# Patient Record
Sex: Male | Born: 1980
Health system: Southern US, Community
[De-identification: ages and names within clinical notes are randomized; demographics above are authoritative.]

## PROBLEM LIST (undated history)

## (undated) ENCOUNTER — Encounter

## (undated) DIAGNOSIS — K603 Anal fistula, unspecified: Secondary | ICD-10-CM

## (undated) DIAGNOSIS — F329 Major depressive disorder, single episode, unspecified: Secondary | ICD-10-CM

## (undated) DIAGNOSIS — F419 Anxiety disorder, unspecified: Secondary | ICD-10-CM

## (undated) DIAGNOSIS — G540 Brachial plexus disorders: Secondary | ICD-10-CM

## (undated) DIAGNOSIS — Z21 Asymptomatic human immunodeficiency virus [HIV] infection status: Secondary | ICD-10-CM

## (undated) DIAGNOSIS — F32A Depression, unspecified: Secondary | ICD-10-CM

## (undated) DIAGNOSIS — B20 Human immunodeficiency virus [HIV] disease: Secondary | ICD-10-CM

## (undated) DIAGNOSIS — F319 Bipolar disorder, unspecified: Secondary | ICD-10-CM

## (undated) DIAGNOSIS — F1910 Other psychoactive substance abuse, uncomplicated: Secondary | ICD-10-CM

## (undated) HISTORY — DX: Anxiety disorder, unspecified: F41.9

## (undated) HISTORY — PX: OTHER SURGICAL HISTORY: SHX169

---
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
## 2011-08-14 HISTORY — PX: OTHER SURGICAL HISTORY: SHX169

## 2015-02-16 DIAGNOSIS — F419 Anxiety disorder, unspecified: Secondary | ICD-10-CM | POA: Insufficient documentation

## 2017-05-15 ENCOUNTER — Emergency Department: Admit: 2017-05-15 | Discharge: 2017-05-15

## 2017-05-15 ENCOUNTER — Inpatient Hospital Stay: Admit: 2017-05-15 | Discharge: 2017-05-15

## 2017-05-15 DIAGNOSIS — F1510 Other stimulant abuse, uncomplicated: Secondary | ICD-10-CM

## 2017-05-15 DIAGNOSIS — F329 Major depressive disorder, single episode, unspecified: Secondary | ICD-10-CM

## 2017-05-15 DIAGNOSIS — F1910 Other psychoactive substance abuse, uncomplicated: Secondary | ICD-10-CM

## 2017-05-15 DIAGNOSIS — G540 Brachial plexus disorders: Secondary | ICD-10-CM

## 2017-05-15 DIAGNOSIS — F419 Anxiety disorder, unspecified: Secondary | ICD-10-CM

## 2017-05-15 DIAGNOSIS — B20 Human immunodeficiency virus [HIV] disease: Secondary | ICD-10-CM

## 2017-05-15 DIAGNOSIS — F1590 Other stimulant use, unspecified, uncomplicated: Secondary | ICD-10-CM

## 2017-05-15 DIAGNOSIS — R42 Dizziness and giddiness: Principal | ICD-10-CM

## 2017-05-15 DIAGNOSIS — Z21 Asymptomatic human immunodeficiency virus [HIV] infection status: Secondary | ICD-10-CM

## 2017-05-15 DIAGNOSIS — R000 Tachycardia, unspecified: Secondary | ICD-10-CM

## 2017-05-15 DIAGNOSIS — F1990 Other psychoactive substance use, unspecified, uncomplicated: Principal | ICD-10-CM

## 2017-05-15 DIAGNOSIS — F17200 Nicotine dependence, unspecified, uncomplicated: Secondary | ICD-10-CM

## 2017-05-15 DIAGNOSIS — M792 Neuralgia and neuritis, unspecified: Secondary | ICD-10-CM

## 2017-05-15 MED ORDER — DULOXETINE HCL 60 MG PO CPEP
Freq: Every day | ORAL
Start: 2017-05-15 — End: ?

## 2017-05-15 MED ORDER — BOLUS IV FLUID JX
Freq: Once | INTRAVENOUS | Status: CP
Start: 2017-05-15 — End: ?

## 2017-05-15 MED ORDER — DIPHENHYDRAMINE HCL 25 MG PO CAPS
25 mg | Freq: Once | ORAL | Status: CP
Start: 2017-05-15 — End: ?

## 2017-05-15 MED ORDER — GABAPENTIN 100 MG PO CAPS
Freq: Three times a day (TID) | ORAL
Start: 2017-05-15 — End: ?

## 2017-05-15 NOTE — ED Provider Notes
R rib resaction   ? SCALENOTOMY         No family history on file.    Social History     Social History   ? Marital status: Single     Spouse name: N/A   ? Number of children: N/A   ? Years of education: N/A     Social History Main Topics   ? Smoking status: Light Tobacco Smoker   ? Smokeless tobacco: Never Used   ? Alcohol use No   ? Drug use: Yes      Comment: IVDU crystal meth   ? Sexual activity: Not Asked     Other Topics Concern   ? None     Social History Narrative   ? None       Review of Systems   Constitutional: Negative for fever, chills and diaphoresis.   HENT: Negative for hearing loss, ear pain, nasal congestion and sore throat.    Eyes: Negative for pain, redness and visual disturbance.   Respiratory: Negative for cough, chest tightness and shortness of breath.    Cardiovascular: Positive for chest pain.   Gastrointestinal: Negative for nausea, vomiting, abdominal pain, diarrhea, constipation and blood in stool.   Genitourinary: Negative for dysuria, urgency, frequency, hematuria and difficulty urinating.   Musculoskeletal: Negative for myalgias and arthralgias.   Skin: Negative for rash.   Neurological: Positive for light-headedness. Negative for tremors, weakness, numbness and headaches.   Psychiatric/Behavioral: Positive for anxiety and substance abuse.   Endocrine: Negative for polyuria.   All other systems reviewed and are negative.      Physical Exam     ED Triage Vitals [05/15/17 1304]   BP 100/69   Pulse 125   Resp 20   Temp 36.3 ?C (97.4 ?F)   Temp src Oral   Height 1.626 m   Weight 70.3 kg   SpO2 99 %   BMI (Calculated) 26.66             Physical Exam   Constitutional: He appears well-developed and well-nourished. No distress.   HENT:   Head: Normocephalic and atraumatic.   Mouth/Throat: Oropharynx is clear and moist. No oropharyngeal exudate.   Eyes: Conjunctivae are normal. Right eye exhibits no discharge. Left eye exhibits no discharge.   Pupils 3-81mm, symmetric and reactive

## 2017-05-15 NOTE — ED Provider Notes
MDM   Decide to obtain history from someone other than the patient: {SH ED Jesse Spencer MDM - OBTAIN ZOXWRUE:45409}    Decide to obtain previous medical records: [facility] ED Jesse Spencer MDM - PREVIOUS MED REC - NO WJX:91478}    Clinical Lab Test(s): {SH ED Jesse Spencer MDM ORDERED AND REVIEWED:28124}    Diagnostic Tests (Radiology, EKG): {SH ED Jesse Spencer MDM ORDERED AND REVIEWED:28124}    Independent Visualization (ED [place], Wet Prep, Other): {SH ED Jesse Spencer MDM NO YES [id number]:86578}    Discussed patient with NON-ED Provider: {SH ED Jesse Spencer MDM - ANOTHER PROVIDER:28381}      ED Disposition   ED Disposition: No ED Disposition Set      ED Clinical Impression   ED Clinical Impression:   No Clinical Impression Set      ED Patient Status   Patient Status:   {SH ED [name] PATIENT [phone]}        ED Medical Evaluation Initiated   Medical Evaluation Initiated:  Yes, filed at 05/15/17 1344  by Sonda Rumble, MD

## 2017-05-15 NOTE — ED Provider Notes
Clinical Lab Test(s): {SH ED Lamonte Sakai MDM ORDERED AND REVIEWED:28124}    Diagnostic Tests (Radiology, EKG): {SH ED Lamonte Sakai MDM ORDERED AND REVIEWED:28124}    Independent Visualization (ED [place], Wet Prep, Other): {SH ED Lamonte Sakai MDM NO YES ZOXWRUEA:54098}    Discussed patient with NON-ED Provider: {SH ED Lamonte Sakai MDM - ANOTHER PROVIDER:28381}      ED Disposition   ED Disposition: No ED Disposition Set      ED Clinical Impression   ED Clinical Impression:   No Clinical Impression Set      ED Patient Status   Patient Status:   {SH ED [facility] PATIENT [phone]}        ED Medical Evaluation Initiated   Medical Evaluation Initiated:  Yes, filed at 05/15/17 1344  by Sonda Rumble, MD

## 2017-05-15 NOTE — ED Provider Notes
exhibits no discharge.   Pupils 3-44mm, symmetric and reactive   Neck: Normal range of motion. Neck supple.   Cardiovascular: Regular rhythm, normal heart sounds and intact distal pulses.  Tachycardia present.  Exam reveals no gallop and no friction rub.    No murmur heard.  Pulmonary/Chest: Effort normal and breath sounds normal. No respiratory distress. He has no wheezes. He has no rales.   Abdominal: Soft. Bowel sounds are normal. He exhibits no distension. There is no tenderness. There is no rebound and no guarding.   Musculoskeletal: He exhibits no edema.   Neurological: He is alert.   Skin: Skin is warm and dry. He is not diaphoretic.   Psychiatric: He has a normal mood and affect. His behavior is normal.   Nursing note and vitals reviewed.      Differential DDx: ***    Is this an Emergent Medical Condition? {SH ED EMERGENT MEDICAL CONDITION:782 082 1039}  409.901 FS  641.19 FS  627.732 (16) FS    ED Workup   Procedures    Labs:  - - No data to display      Imaging (Read by ED Provider):  {Imaging [phone]}      EKG (Read by ED Provider):  {EKG findings:[phone]}        ED Course & Re-Evaluation     ED Course as of May 15 1356   Wed May 15, 2017   1352 Patient presenting after doing crystal meth IV last night around midnight. States he feels like he's tweeking out. Also with "warm sensation" in his chest. Feels some palpitations but improving. He denies any SOB, fevers or chills.     Vitals here with tachcyardia. Pupils 3-73mm and reactive, heart sounds normal other than tachycardia. Lungs CTAB. Abdomen soft and nontender.    Will give IV fluids and benadryl. Will get ECG, Trop, and CXR  [CC]      ED Course User Index  [CC] Clugston, Kandee Keen, MD         MDM   Decide to obtain history from someone other than the patient: {SH ED Lamonte Sakai MDM - OBTAIN ZOXWRUE:45409}    Decide to obtain previous medical records: [facility] ED Lamonte Sakai MDM - PREVIOUS MED REC - NO WJX:91478}

## 2017-05-15 NOTE — ED Provider Notes
Procedure Laterality Date   ? OTHER SURGICAL HISTORY      R rib resaction   ? SCALENOTOMY         No family history on file.    Social History     Social History   ? Marital status: N/A     Spouse name: N/A   ? Number of children: N/A   ? Years of education: N/A     Social History Main Topics   ? Smoking status: Light Tobacco Smoker   ? Smokeless tobacco: Never Used   ? Alcohol use No   ? Drug use: Yes      Comment: IVDU crystal meth   ? Sexual activity: Not Asked     Other Topics Concern   ? None     Social History Narrative   ? None       Review of Systems   Constitutional: Negative for fever, chills and diaphoresis.   HENT: Negative for hearing loss, ear pain, nasal congestion and sore throat.    Eyes: Negative for pain, redness and visual disturbance.   Respiratory: Negative for cough, chest tightness and shortness of breath.    Cardiovascular: Positive for chest pain.   Gastrointestinal: Negative for nausea, vomiting, abdominal pain, diarrhea, constipation and blood in stool.   Genitourinary: Negative for dysuria, urgency, frequency, hematuria and difficulty urinating.   Musculoskeletal: Negative for myalgias and arthralgias.   Skin: Negative for rash.   Neurological: Positive for light-headedness. Negative for tremors, weakness, numbness and headaches.   Psychiatric/Behavioral: Positive for anxiety and substance abuse.   Endocrine: Negative for polyuria.   All other systems reviewed and are negative.      Physical Exam     ED Triage Vitals [05/15/17 1304]   BP 100/69   Pulse 125   Resp 20   Temp 36.3 ?C (97.4 ?F)   Temp src Oral   Height 1.626 m   Weight 70.3 kg   SpO2 99 %   BMI (Calculated) 26.66             Physical Exam   Constitutional: He appears well-developed and well-nourished. No distress.   HENT:   Head: Normocephalic and atraumatic.   Mouth/Throat: Oropharynx is clear and moist. No oropharyngeal exudate.   Eyes: Conjunctivae are normal. Right eye exhibits no discharge. Left eye

## 2017-05-15 NOTE — ED Provider Notes
Neck: Normal range of motion. Neck supple. No JVD present. No tracheal deviation present.   Cardiovascular: Regular rhythm, normal heart sounds and intact distal pulses.  Tachycardia present.  Exam reveals no gallop and no friction rub.    No murmur heard.  Pulmonary/Chest: Effort normal and breath sounds normal. No respiratory distress. He has no wheezes. He has no rales.   Abdominal: Soft. Bowel sounds are normal. He exhibits no distension. There is no tenderness. There is no rebound and no guarding.   Musculoskeletal: He exhibits no edema.   Neurological: He is alert.   Skin: Skin is warm and dry. He is not diaphoretic.   Psychiatric: He has a normal mood and affect. His behavior is normal.   Nursing note and vitals reviewed.      Differential DDx: ACS, Anxiety, Dissection, Intraparenchymal Bleed, and others.     Is this an Emergent Medical Condition? Yes - Severe Pain/Acute Onset of Symptons  409.901 FS  641.19 FS  627.732 (16) FS    ED Workup   Procedures    Labs:  -   POCT TROPININ I - Normal       Result Value Ref Range    Troponin I (Point of Care) <0.05  0.00 - 0.23 ng/mL   POCT TROPININ I         Imaging (Read by ED Provider):  Per Radiology: Xr Chest Single View    Result Date: 05/15/2017  PORTABLE CHEST X-RAY (SINGLE FRONTAL VIEW) Comparison: None History: Chest pain Findings: The lungs are clear.  The cardiac silhouette, mediastinum, and pulmonary vessels are within normal limits.  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 Read By Alla German M.D.  Electronically Verified By - Alla German M.D.  Released Date Time - 05/15/2017 2:36 PM  Resident -         EKG (Read by ED Provider):  Sinus tach, otherwise normal        ED Course & Re-Evaluation     ED Course as of May 16 1711   Wed May 15, 2017   1352 Patient presenting after doing crystal meth IV last night around midnight. States he feels like he's tweeking out. Also with "warm

## 2017-05-15 NOTE — ED Provider Notes
sensation" in his chest. Feels some palpitations but improving. He denies any SOB, fevers or chills.     Vitals here with tachcyardia. Pupils 3-48mm and reactive, heart sounds normal other than tachycardia. Lungs CTAB. Abdomen soft and nontender.    Will give IV fluids and benadryl. Will get ECG, Trop, and CXR  [CC]   1414 ECG with sinus tach  [CC]   1447 Tachycardia improved and patient feeling better. ECG with sinus tach without TWI. Troponin negative. Will discharge home. [facility].   [CC]   1450 Discussed [facility] follow-up with patient. Importance of HIV treatment, and avoiding IVDU. Patient verbalized undersatnding, amenable to discharge at this time.   [EO]      ED Course User Index  [CC] Clugston, Kandee Keen, MD  [EO] French Ana Legrand Pitts, MD         MDM   Decide to obtain history from someone other than the patient: No    Decide to obtain previous medical records: No    Clinical Lab Test(s): Ordered and Reviewed    Diagnostic Tests (Radiology, EKG): Ordered and Reviewed    Independent Visualization (ED [place], Wet Prep, Other): No    Discussed patient with NON-ED Provider: None      ED Disposition   ED Disposition: Discharge      ED Clinical Impression   ED Clinical Impression:   Light headed  Tachycardia  History of HIV infection (CMS-HCC code)  IV drug abuse (CMS-HCC code)      ED Patient Status   Patient Status:   Good        ED Medical Evaluation Initiated   Medical Evaluation Initiated:  Yes, filed at 05/15/17 1344  by Sonda Rumble, MD            Sonda Rumble, MD  Resident  05/15/17 [phone]

## 2017-05-15 NOTE — ED Provider Notes
sensation" in his chest. Feels some palpitations but improving. He denies any SOB, fevers or chills.     Vitals here with tachcyardia. Pupils 3-69mm and reactive, heart sounds normal other than tachycardia. Lungs CTAB. Abdomen soft and nontender.    Will give IV fluids and benadryl. Will get ECG, Trop, and CXR  [CC]   1414 ECG with sinus tach  [CC]      ED Course User Index  [CC] Clugston, Kandee Keen, MD         MDM   Decide to obtain history from someone other than the patient: No    Decide to obtain previous medical records: No    Clinical Lab Test(s): Ordered and Reviewed    Diagnostic Tests (Radiology, EKG): Ordered and Reviewed    Independent Visualization (ED [place], Wet Prep, Other): No    Discussed patient with NON-ED Provider: None      ED Disposition   ED Disposition: No ED Disposition Set      ED Clinical Impression   ED Clinical Impression:   Light headed      ED Patient Status   Patient Status:   Good        ED Medical Evaluation Initiated   Medical Evaluation Initiated:  Yes, filed at 05/15/17 1344  by Sonda Rumble, MD

## 2017-05-15 NOTE — ED Notes
IV removed with catheter intact.  Pressure applied, Time of discharge: 1503  PM., Patient discharged to  Home.  Patient discharged  ambulatory. to exit with belongings in  Stable condition.  Patient escorted by  no one., Written discharge instructions given to  patient.  Patient/recipient  verbalizes discharge instructions.

## 2017-05-15 NOTE — ED Triage Notes
Pt to ed co using iv crystal meth hrs pta. Denies fever or other recent illness. Denies falls or truama. Denies recent long trips or travel out of country. Denies exp to sick contacts. Denies recnt surg or proced. Denies new meds. Denies n/v. Reproted r sided cp. Denies sob cough. Denies pain with insp. Denies abd pain. Reported reprotd decrease in urine production. Denies other urinary ss rpeorted normal bms.     Pt stating dx with HIV 3-4 months ago. Has not started any tx. Denies si hi halluc

## 2017-05-15 NOTE — ED Provider Notes
History     Chief Complaint   Patient presents with   ? Drug Use       Patient is a 36 y.o. male and  has a past medical history of Anxiety; Depression; Drug use; HIV (human immunodeficiency virus infection) (CMS-HCC code); Nerve pain; and Thoracic outlet syndrome who presented to the ED with complaints of feeling uneasy after using crystal meth IVlast night around midnight. States he thinks he may have taken too much. Denies hallucinations but states he has before when he first started using 3-4 years ago and feels like he's just before that stage here. He states chest warmness but no pain. No shortness of breath. States he's been up since last night. Normally takes benadryl with some relief. No cough, fevers, or chills.       The history is provided by the patient.   Drug Use   Ingested substance:  Illicit drugs  Suspected agents: Crystal meth.  Time since incident:  12 hours  Ingestion amount:  Unknown amount IV  Witnesses present: no    Called poison control: no    Incident location:  Home  Context: intentional ingestion    Associated symptoms: chest pain    Associated symptoms: no abdominal pain, no cough, no diaphoresis, no diarrhea, no headaches, no nausea, no shortness of breath and no vomiting        Allergies   Allergen Reactions   ? Haldol [Haloperidol] Other (See Comments)     Tardive dyskinesia        Discharge Medication List as of 05/15/2017  2:49 PM      CONTINUE these medications which have NOT CHANGED    Details   DULoxetine (CYMBALTA) 60 MG PO Capsule Delayed Release Particles Take by mouth daily.Historical Med      gabapentin (NEURONTIN) 100 MG PO Capsule Take by mouth 3 times daily.Historical Med             Past Medical History:   Diagnosis Date   ? Anxiety    ? Depression    ? Drug use    ? HIV (human immunodeficiency virus infection) (CMS-HCC code)    ? Nerve pain    ? Thoracic outlet syndrome        Past Surgical History:   Procedure Laterality Date   ? OTHER SURGICAL HISTORY

## 2017-05-15 NOTE — ED Provider Notes
History     Chief Complaint   Patient presents with   ? Drug Use       Patient is a 36 y.o. male and  has a past medical history of Anxiety; Depression; Drug use; HIV (human immunodeficiency virus infection) (CMS-HCC code); Nerve pain; and Thoracic outlet syndrome who presented to the ED with complaints of feeling uneasy after using crystal meth IVlast night around midnight. States he thinks he may have taken too much. Denies hallucinations but states he has before when he first started using 3-4 years ago and feels like he's just before that stage here. He states chest warmness but no pain. No shortness of breath. States he's been up since last night. Normally takes benadryl with some relief. No cough, fevers, or chills.       The history is provided by the patient.   Drug Use   Ingested substance:  Illicit drugs  Suspected agents: Crystal meth.  Time since incident:  12 hours  Ingestion amount:  Unknown amount IV  Witnesses present: no    Called poison control: no    Incident location:  Home  Context: intentional ingestion    Associated symptoms: chest pain    Associated symptoms: no abdominal pain, no cough, no diaphoresis, no diarrhea, no headaches, no nausea, no shortness of breath and no vomiting        Allergies   Allergen Reactions   ? Haldol [Haloperidol] Other (See Comments)     Tardive dyskinesia        Patient's Medications   New Prescriptions    No medications on file   Previous Medications    DULOXETINE (CYMBALTA) 60 MG PO CAPSULE DELAYED RELEASE PARTICLES    Take by mouth daily.    GABAPENTIN (NEURONTIN) 100 MG PO CAPSULE    Take by mouth 3 times daily.   Modified Medications    No medications on file   Discontinued Medications    No medications on file       Past Medical History:   Diagnosis Date   ? Anxiety    ? Depression    ? Drug use    ? HIV (human immunodeficiency virus infection) (CMS-HCC code)    ? Nerve pain    ? Thoracic outlet syndrome        Past Surgical History:

## 2017-05-15 NOTE — ED Provider Notes
exhibits no discharge.   Pupils 3-64mm, symmetric and reactive   Neck: Normal range of motion. Neck supple.   Cardiovascular: Regular rhythm, normal heart sounds and intact distal pulses.  Tachycardia present.  Exam reveals no gallop and no friction rub.    No murmur heard.  Pulmonary/Chest: Effort normal and breath sounds normal. No respiratory distress. He has no wheezes. He has no rales.   Abdominal: Soft. Bowel sounds are normal. He exhibits no distension. There is no tenderness. There is no rebound and no guarding.   Musculoskeletal: He exhibits no edema.   Neurological: He is alert.   Skin: Skin is warm and dry. He is not diaphoretic.   Psychiatric: He has a normal mood and affect. His behavior is normal.   Nursing note and vitals reviewed.      Differential DDx: ACS, Anxiety, Dissesction, and others.     Is this an Emergent Medical Condition? Yes - Severe Pain/Acute Onset of Symptons  409.901 FS  641.19 FS  627.732 (16) FS    ED Workup   Procedures    Labs:  -   POCT TROPININ I - Normal       Result Value Ref Range    Troponin I (Point of Care) <0.05  0.00 - 0.23 ng/mL   POCT TROPININ I         Imaging (Read by ED Provider):  Per Radiology: Xr Chest Single View    Result Date: 05/15/2017  PORTABLE CHEST X-RAY (SINGLE FRONTAL VIEW) Comparison: None History: Chest pain Findings: The lungs are clear.  The cardiac silhouette, mediastinum, and pulmonary vessels are within normal limits.  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 Read By Alla German M.D.  Electronically Verified By - Alla German M.D.  Released Date Time - 05/15/2017 2:36 PM  Resident -         EKG (Read by ED Provider):  Sinus tach, otherwise normal        ED Course & Re-Evaluation     ED Course as of May 16 1443   Wed May 15, 2017   1352 Patient presenting after doing crystal meth IV last night around midnight. States he feels like he's tweeking out. Also with "warm

## 2017-05-15 NOTE — ED Provider Notes
History     Chief Complaint   Patient presents with   ? Drug Use       HPI    Allergies   Allergen Reactions   ? Haldol [Haloperidol] Other (See Comments)     Tardive dyskinesia        Patient's Medications   New Prescriptions    No medications on file   Previous Medications    DULOXETINE (CYMBALTA) 60 MG PO CAPSULE DELAYED RELEASE PARTICLES    Take by mouth daily.    GABAPENTIN (NEURONTIN) 100 MG PO CAPSULE    Take by mouth 3 times daily.   Modified Medications    No medications on file   Discontinued Medications    No medications on file       Past Medical History:   Diagnosis Date   ? Anxiety    ? Depression    ? Drug use    ? HIV (human immunodeficiency virus infection) (CMS-HCC code)    ? Nerve pain    ? Thoracic outlet syndrome        Past Surgical History:   Procedure Laterality Date   ? OTHER SURGICAL HISTORY      R rib resaction   ? SCALENOTOMY         No family history on file.    Social History     Social History   ? Marital status: N/A     Spouse name: N/A   ? Number of children: N/A   ? Years of education: N/A     Social History Main Topics   ? Smoking status: Light Tobacco Smoker   ? Smokeless tobacco: Never Used   ? Alcohol use No   ? Drug use: Yes      Comment: IVDU crystal meth   ? Sexual activity: Not Asked     Other Topics Concern   ? None     Social History Narrative   ? None       Review of Systems    Physical Exam     ED Triage Vitals [05/15/17 1304]   BP 100/69   Pulse 125   Resp 20   Temp 36.3 ?C (97.4 ?F)   Temp src Oral   Height 1.626 m   Weight 70.3 kg   SpO2 99 %   BMI (Calculated) 26.66             Physical Exam    Differential DDx: ***    Is this an Emergent Medical Condition? {SH ED EMERGENT MEDICAL CONDITION:[phone]}  409.901 FS  641.19 FS  627.732 (16) FS    ED Workup   Procedures    Labs:  - - No data to display      Imaging (Read by ED Provider):  {Imaging [phone]}      EKG (Read by ED Provider):  {EKG findings:[phone]}        ED Course & Re-Evaluation

## 2017-06-04 NOTE — ED Attestation Note
Attestation   I discussed this patient with the resident/fellow.   Comments: DOS 05/15/17  I do not specifically recall examining this patient.               Satira SarkWilliams, Deborah J, MD  06/04/17 [phone]

## 2017-09-11 ENCOUNTER — Encounter: Attending: Internal Medicine

## 2017-11-04 DIAGNOSIS — B20 Human immunodeficiency virus [HIV] disease: Principal | ICD-10-CM

## 2017-12-02 ENCOUNTER — Ambulatory Visit: Attending: Internal Medicine

## 2017-12-02 ENCOUNTER — Encounter

## 2017-12-02 DIAGNOSIS — Z21 Asymptomatic human immunodeficiency virus [HIV] infection status: Principal | ICD-10-CM

## 2017-12-02 DIAGNOSIS — B20 Human immunodeficiency virus [HIV] disease: Principal | ICD-10-CM

## 2017-12-18 ENCOUNTER — Encounter: Attending: Internal Medicine

## 2017-12-30 DIAGNOSIS — F331 Major depressive disorder, recurrent, moderate: Secondary | ICD-10-CM | POA: Insufficient documentation

## 2017-12-30 DIAGNOSIS — F1520 Other stimulant dependence, uncomplicated: Secondary | ICD-10-CM | POA: Insufficient documentation

## 2017-12-30 DIAGNOSIS — F1521 Other stimulant dependence, in remission: Secondary | ICD-10-CM | POA: Insufficient documentation

## 2017-12-30 DIAGNOSIS — Z8619 Personal history of other infectious and parasitic diseases: Secondary | ICD-10-CM

## 2017-12-30 DIAGNOSIS — A539 Syphilis, unspecified: Secondary | ICD-10-CM | POA: Insufficient documentation

## 2017-12-30 HISTORY — DX: Personal history of other infectious and parasitic diseases: Z86.19

## 2018-07-31 DIAGNOSIS — G540 Brachial plexus disorders: Secondary | ICD-10-CM | POA: Insufficient documentation

## 2018-07-31 HISTORY — DX: Brachial plexus disorders: G54.0

## 2018-10-01 DIAGNOSIS — B20 Human immunodeficiency virus [HIV] disease: Secondary | ICD-10-CM | POA: Insufficient documentation

## 2018-10-01 DIAGNOSIS — G540 Brachial plexus disorders: Secondary | ICD-10-CM | POA: Insufficient documentation

## 2018-10-01 DIAGNOSIS — Z5900 Homelessness unspecified: Secondary | ICD-10-CM | POA: Insufficient documentation

## 2018-10-01 DIAGNOSIS — Z21 Asymptomatic human immunodeficiency virus [HIV] infection status: Secondary | ICD-10-CM | POA: Insufficient documentation

## 2019-03-04 DIAGNOSIS — F3181 Bipolar II disorder: Secondary | ICD-10-CM | POA: Insufficient documentation

## 2019-06-08 ENCOUNTER — Ambulatory Visit: Payer: Self-pay

## 2019-06-08 ENCOUNTER — Other Ambulatory Visit: Payer: Self-pay

## 2019-06-08 DIAGNOSIS — Z113 Encounter for screening for infections with a predominantly sexual mode of transmission: Secondary | ICD-10-CM

## 2019-06-08 DIAGNOSIS — B20 Human immunodeficiency virus [HIV] disease: Secondary | ICD-10-CM

## 2019-06-08 DIAGNOSIS — Z79899 Other long term (current) drug therapy: Secondary | ICD-10-CM

## 2019-06-23 ENCOUNTER — Ambulatory Visit: Payer: Self-pay | Admitting: Pharmacist

## 2019-06-23 ENCOUNTER — Encounter: Payer: Self-pay | Admitting: Infectious Diseases

## 2019-07-06 ENCOUNTER — Other Ambulatory Visit: Payer: Self-pay

## 2019-07-06 ENCOUNTER — Ambulatory Visit: Payer: Self-pay

## 2019-07-21 ENCOUNTER — Ambulatory Visit: Payer: Self-pay | Admitting: Pharmacist

## 2019-07-21 ENCOUNTER — Telehealth: Payer: Self-pay

## 2019-07-21 ENCOUNTER — Encounter: Payer: Self-pay | Admitting: Infectious Diseases

## 2019-07-21 NOTE — Telephone Encounter (Signed)
Attempted to contact new transferring patient to reschedule missed appointment today with Doren Custard, NP. Left voicemail to contact [facility] to reschedule.  Records remain up front if patient calls to reschedule.  Eugenia Mcalpine

## 2019-07-23 ENCOUNTER — Other Ambulatory Visit: Payer: Self-pay | Admitting: Infectious Diseases

## 2019-07-23 ENCOUNTER — Other Ambulatory Visit: Payer: Self-pay

## 2019-07-23 ENCOUNTER — Ambulatory Visit: Payer: Self-pay

## 2019-07-23 ENCOUNTER — Telehealth: Payer: Self-pay | Admitting: Pharmacy Technician

## 2019-07-23 DIAGNOSIS — B20 Human immunodeficiency virus [HIV] disease: Secondary | ICD-10-CM

## 2019-07-23 DIAGNOSIS — Z79899 Other long term (current) drug therapy: Secondary | ICD-10-CM

## 2019-07-23 DIAGNOSIS — Z113 Encounter for screening for infections with a predominantly sexual mode of transmission: Secondary | ICD-10-CM

## 2019-07-23 MED ORDER — BICTEGRAVIR-EMTRICITAB-TENOFOV 50-200-25 MG PO TABS: 1.0000 | ORAL_TABLET | Freq: Every day | ORAL | 5 refills | Status: DC

## 2019-07-23 NOTE — Progress Notes (Signed)
Patient missed appt with me earlier this week d/t acute illness. He is working with Lovena Le / [facility] and requested refill for [place] prior to visit - will send to requested pharmacy.

## 2019-07-23 NOTE — Telephone Encounter (Signed)
[facility] Patient Advocate Encounter  Completed and sent [employer] Advancing Access application for [name] for this patient who is uninsured.    Patient is approved 07/23/2019 through 08/13/2019.  BIN      [id number] PCN    [id number] GRP    [id number] ID        [id number]  An application has been submitted for additional assistance beyond 08/13/2019   Kenneth Hunt [name] [facility] for Infectious Disease Phone: [phone] Fax:  [phone]

## 2019-07-24 LAB — T-HELPER CELL (CD4) - (RCID CLINIC ONLY)
CD4 % Helper T Cell: 26 % — ABNORMAL LOW (ref 33–65)
CD4 T Cell Abs: 86 /uL — ABNORMAL LOW (ref 400–1790)

## 2019-07-24 LAB — URINALYSIS
Bilirubin Urine: NEGATIVE
Glucose, UA: NEGATIVE
Hgb urine dipstick: NEGATIVE
Nitrite: NEGATIVE
Specific Gravity, Urine: 1.031 (ref 1.001–1.03)
pH: 5.5 (ref 5.0–8.0)

## 2019-07-24 LAB — URINE CYTOLOGY ANCILLARY ONLY
Chlamydia: NEGATIVE
Comment: NEGATIVE
Comment: NORMAL
Neisseria Gonorrhea: NEGATIVE

## 2019-07-27 NOTE — Telephone Encounter (Signed)
[facility] Patient Advocate Encounter  Received fax from [place], patient has been approved for the enrollment period 07/23/2019 to 07/22/2020. Billing information listed below.

## 2019-07-29 LAB — RPR: RPR Ser Ql: REACTIVE — AB

## 2019-07-29 LAB — CBC WITH DIFFERENTIAL/PLATELET
Absolute Monocytes: 622 cells/uL (ref 200–950)
Basophils Absolute: 44 cells/uL (ref 0–200)
Basophils Relative: 0.2 %
Eosinophils Absolute: 22 cells/uL (ref 15–500)
Eosinophils Relative: 0.1 %
HCT: 48.6 % (ref 38.5–50.0)
Hemoglobin: 17 g/dL (ref 13.2–17.1)
Lymphs Abs: 355 cells/uL — ABNORMAL LOW (ref 850–3900)
MCH: 31.4 pg (ref 27.0–33.0)
MCHC: 35 g/dL (ref 32.0–36.0)
MCV: 89.8 fL (ref 80.0–100.0)
MPV: 10.1 fL (ref 7.5–12.5)
Monocytes Relative: 2.8 %
Neutro Abs: 21157 cells/uL — ABNORMAL HIGH (ref 1500–7800)
Neutrophils Relative %: 95.3 %
Platelets: 292 10*3/uL (ref 140–400)
RBC: 5.41 10*6/uL (ref 4.20–5.80)
RDW: 12.5 % (ref 11.0–15.0)
Total Lymphocyte: 1.6 %
WBC: 22.2 10*3/uL — ABNORMAL HIGH (ref 3.8–10.8)

## 2019-07-29 LAB — QUANTIFERON-TB GOLD PLUS
Mitogen-NIL: 2.25 [IU]/mL
NIL: 0.7 [IU]/mL
QuantiFERON-TB Gold Plus: NEGATIVE
TB1-NIL: <0 [IU]/mL
TB2-NIL: <0 [IU]/mL

## 2019-07-29 LAB — LIPID PANEL
Cholesterol: 155 mg/dL
HDL: 55 mg/dL
LDL Cholesterol (Calc): 85 mg/dL
Non-HDL Cholesterol (Calc): 100 mg/dL
Total CHOL/HDL Ratio: 2.8 (calc)
Triglycerides: 60 mg/dL

## 2019-07-29 LAB — COMPLETE METABOLIC PANEL WITH GFR
AG Ratio: 1.6 (calc) (ref 1.0–2.5)
ALT: 15 U/L (ref 9–46)
AST: 19 U/L (ref 10–40)
Albumin: 4.3 g/dL (ref 3.6–5.1)
Alkaline phosphatase (APISO): 81 U/L (ref 36–130)
BUN: 11 mg/dL (ref 7–25)
CO2: 23 mmol/L (ref 20–32)
Calcium: 9.6 mg/dL (ref 8.6–10.3)
Chloride: 103 mmol/L (ref 98–110)
Creat: 1.02 mg/dL (ref 0.60–1.35)
GFR, Est African American: 106 mL/min/{1.73_m2} (ref >=60)
GFR, Est Non African American: 92 mL/min/{1.73_m2} (ref >=60)
Globulin: 2.7 g/dL (calc) (ref 1.9–3.7)
Glucose, Bld: 123 mg/dL — ABNORMAL HIGH (ref 65–99)
Potassium: 4.6 mmol/L (ref 3.5–5.3)
Sodium: 136 mmol/L (ref 135–146)
Total Bilirubin: 1 mg/dL (ref 0.2–1.2)
Total Protein: 7 g/dL (ref 6.1–8.1)

## 2019-07-29 LAB — HEPATITIS B SURFACE ANTIGEN: Hepatitis B Surface Ag: NONREACTIVE

## 2019-07-29 LAB — HLA B*5701: HLA-B*5701 w/rflx HLA-B High: NEGATIVE

## 2019-07-29 LAB — HEPATITIS C ANTIBODY
Hepatitis C Ab: NONREACTIVE
SIGNAL TO CUT-OFF: 0.1 (ref <1.00)

## 2019-07-29 LAB — HEPATITIS A ANTIBODY, TOTAL: Hepatitis A AB,Total: REACTIVE — AB

## 2019-07-29 LAB — HEPATITIS B SURFACE ANTIBODY,QUALITATIVE: Hep B S Ab: NONREACTIVE

## 2019-07-29 LAB — RPR TITER: RPR Titer: 1:1 {titer} — ABNORMAL HIGH

## 2019-07-29 LAB — HIV-1/2 AB - DIFFERENTIATION
HIV-1 antibody: POSITIVE — AB
HIV-2 Ab: NEGATIVE

## 2019-07-29 LAB — FLUORESCENT TREPONEMAL AB(FTA)-IGG-BLD: Fluorescent Treponemal ABS: REACTIVE — AB

## 2019-07-29 LAB — HIV-1 RNA ULTRAQUANT REFLEX TO GENTYP+
HIV 1 RNA Quant: 42 {copies}/mL — ABNORMAL HIGH
HIV-1 RNA Quant, Log: 1.62 {Log_copies}/mL — ABNORMAL HIGH

## 2019-07-29 LAB — HIV ANTIBODY (ROUTINE TESTING W REFLEX): HIV 1&2 Ab, 4th Generation: REACTIVE — AB

## 2019-07-29 LAB — HEPATITIS B CORE ANTIBODY, TOTAL: Hep B Core Total Ab: NONREACTIVE

## 2019-08-24 ENCOUNTER — Encounter: Payer: Self-pay | Admitting: Infectious Diseases

## 2019-08-24 ENCOUNTER — Other Ambulatory Visit: Payer: Self-pay

## 2019-08-24 ENCOUNTER — Ambulatory Visit (INDEPENDENT_AMBULATORY_CARE_PROVIDER_SITE_OTHER): Payer: Self-pay | Admitting: Infectious Diseases

## 2019-08-24 VITALS — BP 120/82 | HR 85 | Wt 164.8 lb

## 2019-08-24 DIAGNOSIS — F1520 Other stimulant dependence, uncomplicated: Secondary | ICD-10-CM

## 2019-08-24 DIAGNOSIS — Z23 Encounter for immunization: Secondary | ICD-10-CM

## 2019-08-24 DIAGNOSIS — F1990 Other psychoactive substance use, unspecified, uncomplicated: Secondary | ICD-10-CM

## 2019-08-24 DIAGNOSIS — F3181 Bipolar II disorder: Secondary | ICD-10-CM

## 2019-08-24 DIAGNOSIS — Z8619 Personal history of other infectious and parasitic diseases: Secondary | ICD-10-CM

## 2019-08-24 DIAGNOSIS — B20 Human immunodeficiency virus [HIV] disease: Secondary | ICD-10-CM

## 2019-08-24 DIAGNOSIS — F331 Major depressive disorder, recurrent, moderate: Secondary | ICD-10-CM

## 2019-08-24 DIAGNOSIS — Z113 Encounter for screening for infections with a predominantly sexual mode of transmission: Secondary | ICD-10-CM

## 2019-08-24 NOTE — Patient Instructions (Addendum)
Very nice to meet you today and I look forward to working with you.   Please continue to take your Biktarvy every day as you are.  If you take any multivitamins please separate from your biktarvy 6 hours after  Vaccines Given Today:  Hepatitis B - new 2 dose regimen with Heplisav. Please schedule your next injection in 8 weeks   Please schedule a visit with Marylu Lund our counselor.   Please come back to see Judeth Cornfield again in 3 months.

## 2019-08-24 NOTE — Progress Notes (Signed)
Name: Kenneth Hunt  DOB: [birth year] MRN: [id number] PCP: Patient, No Pcp Per    Patient Active Problem List   Diagnosis Date Noted  . Human immunodeficiency virus (HIV) disease ([place]) 10/01/2018    Priority: High  . Injection of illicit drug within last 12 months 08/26/2019    Priority: Medium  . Methamphetamine dependence ([place]) 12/30/2017    Priority: Medium  . Bipolar 2 disorder ([place]) 03/04/2019  . Thoracic outlet syndrome 10/01/2018  . 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 Moderate recurrent major depression ([place]) 12/30/2017  . History of syphilis 12/30/2017     Brief Narrative:  Kenneth Hunt is a 39 y.o. male with well controlled HIV. Dx .  Transferred care from [place] in [place]  [phone])  VL < 20 recently with CD4 ~340 on Biktarvy  HIV Risk: MSM History of OIs: none known  Intake Labs 07/23/2019: Hep B sAg (-), sAb (-), cAb (-); Hep A (immune), Hep C (-) Quantiferon (-) HLA B*5701 (-) G6PD: () Toxo IgG: (-)   Previous Regimens: . Biktarvy   Genotypes: . None on record   Subjective:  CC:  New patient transferring HIV care.  substance use disorder   HPI: The patient recently moved from [place] to the [place] to be closer to his sister.  He is then since moved to [facility] to be on his own.  He has been taking Biktarvy for well over a year now to treat HIV infection.  From his report this is the only medication he has used since starting treatment.  He was diagnosed about 3 years ago.  He does not recall having had a low CD4 count requiring prophylactic antibiotics but does not recall specifically his last lab work.  He asks about his results from the testing we have done.  He reports that he may miss his Biktarvy about once a week worst-case scenario but otherwise feels like he does a pretty good job getting him in every day at the same time.  He has had a  history of syphilis in the past that was adequately treated.  He does have male partners and reports recently he has had some "risky behaviors."  He reports that all sexual sites been exposed.  Condom use is inconsistent.  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 He has a history of bipolar disorder and substance use disorder with active injection of methamphetamines.  He supposed to be on duloxetine, gabapentin, aripiprazole but he has not had any ongoing mental health care since he is relocated to the area.  He does not smoke cigarettes.  He does use alcohol on occasion but does not binge.  He has tried counseling in the past and feels like this may benefit him now.  He has "a lot on his mind" as well as "a lot of time".  He knows that this is not a good combination and often leads to excessive drug use.  He uses methamphetamines every 2 to 3 days.   At the time of his lab draw a month ago he was very ill with fevers/chills, abdominal pain/nausea and constant diarrhea. He almost did not come because he felt so ill. These symptoms have since resolved and his roommate at the time had similar symptoms.    Review of Systems  Constitutional: Negative for appetite change, chills, fatigue, fever and unexpected weight change.  Eyes: Negative for visual disturbance.  Respiratory: Negative for cough and shortness of breath.   Cardiovascular: Negative for chest pain and leg swelling.  Gastrointestinal: Negative for abdominal pain, diarrhea and nausea.  Genitourinary: Negative for discharge, dysuria and genital sores.  Musculoskeletal: Negative for joint swelling.  Skin: Negative for color change and rash.  Neurological: Negative for dizziness and headaches.  Hematological: Negative for adenopathy.  Psychiatric/Behavioral: Positive for dysphoric mood and sleep disturbance. The patient is not nervous/anxious.        Substance use     Past Medical History:  Diagnosis Date  .  Brachial plexus disorders 07/31/2018   Seen by [facility] neurology. MRI brain and cervical spine showed bilateral neural foraminal cysts C4-C5, C5-C6, C6-C7, brain within normal limits.  - continue cymbalta, flexeril, neurontin - f/u with [facility] 03/28/19  . History of syphilis 12/30/2017   RPR 1:1 12/25/17 s/p adequate treatment    Outpatient Medications Prior to Visit  Medication Sig Dispense Refill  . bictegravir-emtricitabine-tenofovir AF (BIKTARVY) 50-200-25 MG TABS tablet Take 1 tablet by mouth daily. Try to take at the same time each day with or without food. 30 tablet 5  . DULoxetine (CYMBALTA) 30 MG capsule Take by mouth.    . gabapentin (NEURONTIN) 100 MG capsule Take by mouth.    . ARIPiprazole Lauroxil ER (ARISTADA) 1064 MG/3.9ML PRSY Inject into the muscle.    . cyclobenzaprine (FLEXERIL) 10 MG tablet Take by mouth.    . Naltrexone 380 MG SUSR Inject into the muscle.     No facility-administered medications prior to visit.     Allergies  Allergen Reactions  . Haloperidol Other (See Comments)    Tardive dyskinesia    Social History   Tobacco Use  . Smoking status: Never Smoker  . Smokeless tobacco: Never Used  Substance Use Topics  . Alcohol use: Not Currently  . Drug use: Yes    Types: IV, Methamphetamines    Comment: crystal meth; hasn't used in 3 days    No family history on file.  Social History   Substance and Sexual Activity  Sexual Activity Yes  . Partners: Male   Comment: declined condoms; 1/21     Objective:   Vitals:   08/24/19 1057  BP: 120/82  Pulse: 85  Weight: 164 lb 12.8 oz (74.8 kg)   There is no height or weight on file to calculate BMI.  Physical Exam  Lab Results Lab Results  Component Value Date   WBC 22.2 (H) 07/23/2019   HGB 17.0 07/23/2019   HCT 48.6 07/23/2019   MCV 89.8 07/23/2019   PLT 292 07/23/2019    Lab Results  Component Value Date   CREATININE 1.02 07/23/2019   BUN 11  07/23/2019   NA 136 07/23/2019   K 4.6 07/23/2019   CL 103 07/23/2019   CO2 23 07/23/2019    Lab Results  Component Value Date   ALT 15 07/23/2019   AST 19 07/23/2019   BILITOT 1.0 07/23/2019    Lab Results  Component Value Date   CHOL 155 07/23/2019   HDL 55 07/23/2019   LDLCALC 85 07/23/2019   TRIG 60 07/23/2019   CHOLHDL 2.8 07/23/2019   HIV 1 RNA Quant (copies/mL)  Date Value  07/23/2019 42 (H)   CD4 T Cell Abs (/uL)  Date Value  08/24/2019 723  07/23/2019 86 (L)     Assessment & Plan:   Problem List Items Addressed This Visit      High  Human immunodeficiency virus (HIV) disease ([place]) - Primary (Chronic)    New patient here to transfer HIV care.   I discussed with Stuart Key treatment options/side effects, benefits of treatment and long-term outcomes. I discussed how HIV is transmitted and the process of untreated HIV including increased risk for opportunistic infections, cancer, dementia and renal failure. Patient was counseled on routine HIV care including medication adherence, blood monitoring, necessary vaccines and follow up visits. Counseled regarding safe sex practices including: condom use, partner disclosure, limiting partners. Patient spent time talking with our pharmacist Terrence Dupont regarding successful practices of ART and understands to reach out to our clinic in the future with questions.   Will continue Biktarvy for him as it seems to be working well. His viral load recently was excellent at only 46 copies. His CD4 count had an abrupt drop to < 100 however his percentage remained > 30% likely reflecting more the acute unrelated illness he experienced in December. Will repeat this for his reassurance as I suspect it is well above 200 now being he has continued his Biktarvy and illness resolved.   General introduction to our clinic and integrated services. He is already working with [place] case Freight forwarder. I will help him schedule an appointment for  counseling with Marcie Bal as soon as possible. Dental referral placed today for [facility]. Information to schedule appointment completed today.   I spent greater than 45 minutes with the patient today. Greater than 50% of the time spent face-to-face counseling and coordination of care re: HIV and health maintenance.        Relevant Orders   T-helper cell (CD4)- ([facility] clinic only) (Completed)   Heplisav-B (HepB-CPG) Vaccine (Completed)     Medium   Methamphetamine dependence ([place])    He has struggled with substance use disorder for a long time. Previously receiving vivitrol injections which "dulled the high" but did not completely work to prevent desire to use.  His Hepatitis C Ab returned negative - will continue with annual screening given IV drug use His Hepatitis B sAg is negative with borderline immunity to hepatitis B. Given risky sexual and injection behaviors will proceed with re-vaccination with Heplisav today with 2nd dose in 8 weeks.       Injection of illicit drug within last 12 months    Harm reduction strategies discussed. Referrals and ongoing screening as above.         Unprioritized   Moderate recurrent major depression (HCC)   Relevant Medications   DULoxetine (CYMBALTA) 30 MG capsule   History of syphilis    No findings on lab or clinic assessment to suggest new infection. Serofast at 1:1.  3 point STI testing today for latent gonorrhea / chlamydia infection.       Bipolar 2 disorder ([place])    Not currently on treatment at this time. Information provided to establish care with [facility]. He will also start meeting with Marcie Bal our counselor here.        Other Visit Diagnoses    Routine screening for STI (sexually transmitted infection)       Relevant Orders   Oral/Anal GC/C Testing (Completed)   Urine GC / C (Completed)   Oral/Anal GC/C Testing (Completed)   Need for hepatitis B vaccination       Relevant Orders   Heplisav-B (HepB-CPG) Vaccine (Completed)      RTC in 3 months   Janene Madeira, MSN, NP-C [facility] [place] [facility] Pager: [phone]  Office: [phone]  08/26/19  11:25 AM

## 2019-08-24 NOTE — Assessment & Plan Note (Addendum)
>>  ASSESSMENT AND PLAN FOR METHAMPHETAMINE USE DISORDER, MODERATE, IN EARLY REMISSION (HCC) WRITTEN ON 08/26/2019 11:24 AM BY Marthena Whitmyer N, NP  He has struggled with substance use disorder for a long time. Previously receiving vivitrol injections which "dulled the high" but did not completely work to prevent desire to use.  His Hepatitis C Ab returned negative - will continue with annual screening given IV drug use His Hepatitis B sAg is negative with borderline immunity to hepatitis B. Given risky sexual and injection behaviors will proceed with re-vaccination with Heplisav today with 2nd dose in 8 weeks.    >>ASSESSMENT AND PLAN FOR INJECTION OF ILLICIT DRUG WITHIN LAST 12 MONTHS WRITTEN ON 08/26/2019 11:25 AM BY Sharmin Foulk N, NP  Harm reduction strategies discussed. Referrals and ongoing screening as above.

## 2019-08-25 ENCOUNTER — Telehealth: Payer: Self-pay

## 2019-08-25 LAB — CYTOLOGY, (ORAL, ANAL, URETHRAL) ANCILLARY ONLY
Chlamydia: NEGATIVE
Chlamydia: NEGATIVE
Comment: NEGATIVE
Comment: NORMAL
Comment: NEGATIVE
Comment: NORMAL
Neisseria Gonorrhea: POSITIVE — AB
Neisseria Gonorrhea: POSITIVE — AB

## 2019-08-25 LAB — URINE CYTOLOGY ANCILLARY ONLY
Chlamydia: NEGATIVE
Comment: NEGATIVE
Comment: NORMAL
Neisseria Gonorrhea: NEGATIVE

## 2019-08-25 LAB — T-HELPER CELL (CD4) - (RCID CLINIC ONLY)
CD4 % Helper T Cell: 27 % — ABNORMAL LOW (ref 33–65)
CD4 T Cell Abs: 723 /uL (ref 400–1790)

## 2019-08-25 NOTE — Telephone Encounter (Signed)
Per Judeth Cornfield, NP; patient's CD4 count 723. Notified patient of results. Patient was extremely grateful for information. Will call with further questions.  Knute Mazzuca Loyola Mast, RN

## 2019-08-26 ENCOUNTER — Encounter: Payer: Self-pay | Admitting: Infectious Diseases

## 2019-08-26 ENCOUNTER — Telehealth: Payer: Self-pay

## 2019-08-26 DIAGNOSIS — F1990 Other psychoactive substance use, unspecified, uncomplicated: Secondary | ICD-10-CM | POA: Insufficient documentation

## 2019-08-26 NOTE — Assessment & Plan Note (Signed)
No findings on lab or clinic assessment to suggest new infection. Serofast at 1:1.  3 point STI testing today for latent gonorrhea / chlamydia infection.

## 2019-08-26 NOTE — Telephone Encounter (Signed)
Contacted patient to inform him of positive gonorrhea test. Scheduled patient in a nurse visit for treatment 08/27/2019   Rosanna Randy, RN

## 2019-08-26 NOTE — Assessment & Plan Note (Signed)
New patient here to transfer HIV care.   I discussed with Cristiano Joynt treatment options/side effects, benefits of treatment and long-term outcomes. I discussed how HIV is transmitted and the process of untreated HIV including increased risk for opportunistic infections, cancer, dementia and renal failure. Patient was counseled on routine HIV care including medication adherence, blood monitoring, necessary vaccines and follow up visits. Counseled regarding safe sex practices including: condom use, partner disclosure, limiting partners. Patient spent time talking with our pharmacist Kara Mead regarding successful practices of ART and understands to reach out to our clinic in the future with questions.   Will continue Biktarvy for him as it seems to be working well. His viral load recently was excellent at only 46 copies. His CD4 count had an abrupt drop to < 100 however his percentage remained > 30% likely reflecting more the acute unrelated illness he experienced in December. Will repeat this for his reassurance as I suspect it is well above 200 now being he has continued his Biktarvy and illness resolved.   General introduction to our clinic and integrated services. He is already working with [facility] case Production designer, theatre/television/film. I will help him schedule an appointment for counseling with Marylu Lund as soon as possible. Dental referral placed today for [facility]. Information to schedule appointment completed today.   I spent greater than 45 minutes with the patient today. Greater than 50% of the time spent face-to-face counseling and coordination of care re: HIV and health maintenance.

## 2019-08-26 NOTE — Assessment & Plan Note (Addendum)
Harm reduction strategies discussed. Referrals and ongoing screening as above.

## 2019-08-26 NOTE — Assessment & Plan Note (Signed)
Not currently on treatment at this time. Information provided to establish care with [facility]. He will also start meeting with Marylu Lund our counselor here.

## 2019-08-27 ENCOUNTER — Ambulatory Visit: Payer: Self-pay

## 2019-08-28 ENCOUNTER — Ambulatory Visit (INDEPENDENT_AMBULATORY_CARE_PROVIDER_SITE_OTHER): Payer: Medicare Other

## 2019-08-28 ENCOUNTER — Other Ambulatory Visit: Payer: Self-pay

## 2019-08-28 DIAGNOSIS — B20 Human immunodeficiency virus [HIV] disease: Secondary | ICD-10-CM

## 2019-08-28 DIAGNOSIS — A549 Gonococcal infection, unspecified: Secondary | ICD-10-CM

## 2019-08-28 MED ORDER — CEFTRIAXONE SODIUM 500 MG IJ SOLR
500.0000 mg | Freq: Once | INTRAMUSCULAR | Status: AC
  Administered 2019-08-28: 11:00:00 500 mg via INTRAMUSCULAR

## 2019-08-28 NOTE — Progress Notes (Signed)
Patient arrived at office today for gonorrhea with positive oral and rectal swabs on 08/26/19.  Per Rexene Alberts, Np patient to receive  500 mg IM rocephin.   Patient encouraged to abstain from having any sexual contact 10 days after treatment.   Patient tolerated IM injection well. Condoms accepted.  Valarie Cones

## 2019-09-01 ENCOUNTER — Other Ambulatory Visit: Payer: Self-pay

## 2019-09-01 ENCOUNTER — Emergency Department (HOSPITAL_COMMUNITY)
Admission: EM | Admit: 2019-09-01 | Discharge: 2019-09-01 | Disposition: A | Discharge status: Home / Self Care | Payer: Self-pay | Attending: Emergency Medicine | Admitting: Emergency Medicine

## 2019-09-01 DIAGNOSIS — F1910 Other psychoactive substance abuse, uncomplicated: Secondary | ICD-10-CM

## 2019-09-01 DIAGNOSIS — B20 Human immunodeficiency virus [HIV] disease: Secondary | ICD-10-CM | POA: Insufficient documentation

## 2019-09-01 DIAGNOSIS — Z79899 Other long term (current) drug therapy: Secondary | ICD-10-CM | POA: Insufficient documentation

## 2019-09-01 DIAGNOSIS — F15221 Other stimulant dependence with intoxication delirium: Secondary | ICD-10-CM | POA: Insufficient documentation

## 2019-09-01 DIAGNOSIS — F15921 Other stimulant use, unspecified with intoxication delirium: Secondary | ICD-10-CM

## 2019-09-01 DIAGNOSIS — F3181 Bipolar II disorder: Secondary | ICD-10-CM | POA: Insufficient documentation

## 2019-09-01 LAB — CBC
HCT: 40.9 % (ref 39.0–52.0)
Hemoglobin: 14.4 g/dL (ref 13.0–17.0)
MCH: 31.4 pg (ref 26.0–34.0)
MCHC: 35.2 g/dL (ref 30.0–36.0)
MCV: 89.1 fL (ref 80.0–100.0)
Platelets: 272 10*3/uL (ref 150–400)
RBC: 4.59 MIL/uL (ref 4.22–5.81)
RDW: 11.9 % (ref 11.5–15.5)
WBC: 9.4 10*3/uL (ref 4.0–10.5)
nRBC: 0 % (ref 0.0–0.2)

## 2019-09-01 LAB — COMPREHENSIVE METABOLIC PANEL
ALT: 21 U/L (ref 0–44)
AST: 38 U/L (ref 15–41)
Albumin: 3.7 g/dL (ref 3.5–5.0)
Alkaline Phosphatase: 71 U/L (ref 38–126)
Anion gap: 8 (ref 5–15)
BUN: 14 mg/dL (ref 6–20)
CO2: 23 mmol/L (ref 22–32)
Calcium: 8.8 mg/dL — ABNORMAL LOW (ref 8.9–10.3)
Chloride: 103 mmol/L (ref 98–111)
Creatinine, Ser: 0.97 mg/dL (ref 0.61–1.24)
GFR calc Af Amer: >60 mL/min (ref >60)
GFR calc non Af Amer: >60 mL/min (ref >60)
Glucose, Bld: 111 mg/dL — ABNORMAL HIGH (ref 70–99)
Potassium: 3.3 mmol/L — ABNORMAL LOW (ref 3.5–5.1)
Sodium: 134 mmol/L — ABNORMAL LOW (ref 135–145)
Total Bilirubin: 1.5 mg/dL — ABNORMAL HIGH (ref 0.3–1.2)
Total Protein: 6.3 g/dL — ABNORMAL LOW (ref 6.5–8.1)

## 2019-09-01 LAB — ETHANOL: Alcohol, Ethyl (B): <10 mg/dL (ref <10)

## 2019-09-01 LAB — SALICYLATE LEVEL: Salicylate Lvl: <7 mg/dL — ABNORMAL LOW (ref 7.0–30.0)

## 2019-09-01 LAB — ACETAMINOPHEN LEVEL: Acetaminophen (Tylenol), Serum: <10 ug/mL — ABNORMAL LOW (ref 10–30)

## 2019-09-01 LAB — RAPID URINE DRUG SCREEN, HOSP PERFORMED
Amphetamines: POSITIVE — AB
Barbiturates: NOT DETECTED
Benzodiazepines: NOT DETECTED
Cocaine: NOT DETECTED
Opiates: NOT DETECTED
Tetrahydrocannabinol: NOT DETECTED

## 2019-09-01 LAB — CBG MONITORING, ED: Glucose-Capillary: 110 mg/dL — ABNORMAL HIGH (ref 70–99)

## 2019-09-01 MED ORDER — ZIPRASIDONE MESYLATE 20 MG IM SOLR
20.0000 mg | Freq: Once | INTRAMUSCULAR | Status: AC
  Administered 2019-09-01: 20 mg via INTRAMUSCULAR
  Filled 2019-09-01: qty 20

## 2019-09-01 MED ORDER — ONDANSETRON HCL 4 MG/2ML IJ SOLN
4.0000 mg | Freq: Once | INTRAMUSCULAR | Status: AC
  Administered 2019-09-01: 4 mg via INTRAVENOUS
  Filled 2019-09-01: qty 2

## 2019-09-01 MED ORDER — LORAZEPAM 2 MG/ML IJ SOLN
2.0000 mg | Freq: Once | INTRAMUSCULAR | Status: AC
  Administered 2019-09-01: 2 mg via INTRAVENOUS
  Filled 2019-09-01: qty 1

## 2019-09-01 NOTE — ED Triage Notes (Signed)
Pt states that he shot up "crystal meth", pulled the code button in Triage and states frequently that he is dying.

## 2019-09-01 NOTE — ED Notes (Signed)
Pt would not allow [place] to obtain vitals/ etc. States that "the numbers aren't good, I'm going to die".

## 2019-09-01 NOTE — ED Notes (Signed)
Pt extremely anxious and yelling out that he is going to die. Pt attempted several times to get out of bed and pull off leads.

## 2019-09-01 NOTE — ED Notes (Signed)
EDP at bedside  

## 2019-09-01 NOTE — ED Provider Notes (Signed)
[facility] EMERGENCY DEPARTMENT Provider Note  CSN: [id number] Arrival date & time: 09/01/19 0135  Chief Complaint(s) Addiction Problem  HPI Kenneth Hunt is a 39 y.o. male with a past medical history listed below including bipolar, polysubstance abuse who presents after methamphetamine use in excited delirium thinking that he is going to die.  Patient is tachycardic and is endorsing chest discomfort.  Remainder of history, ROS, and physical exam limited due to patient's condition (excited delirium). Level V Caveat.    HPI  Past Medical History Past Medical History:  Diagnosis Date  . Brachial plexus disorders 07/31/2018   Seen by [facility] neurology. MRI brain and cervical spine showed bilateral neural foraminal cysts C4-C5, C5-C6, C6-C7, brain within normal limits.  - continue cymbalta, flexeril, neurontin - f/u with Dr. Elana Alm [facility] neurology 03/28/19  . History of syphilis 12/30/2017   RPR 1:1 12/25/17 s/p adequate treatment   Patient Active Problem List   Diagnosis Date Noted  . Injection of illicit drug within last 12 months 08/26/2019  . Bipolar 2 disorder ([place]) 03/04/2019  . Human immunodeficiency virus (HIV) disease ([place]) 10/01/2018  . Thoracic outlet syndrome 10/01/2018  . 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 Methamphetamine dependence ([place]) 12/30/2017  . Moderate recurrent major depression ([place]) 12/30/2017  . History of syphilis 12/30/2017   Home Medication(s) Prior to Admission medications   Medication Sig Start Date End Date Taking? Authorizing Provider  ARIPiprazole Lauroxil ER (ARISTADA) 1064 MG/3.9ML PRSY Inject into the muscle. 03/18/19   [provider]  bictegravir-emtricitabine-tenofovir AF (BIKTARVY) 50-200-25 MG TABS tablet Take 1 tablet by mouth daily. Try to take at the same time each day with or without food. 07/23/19   Dayton Callas, NP  cyclobenzaprine (FLEXERIL) 10 MG tablet Take by mouth.     [provider]  DULoxetine (CYMBALTA) 30 MG capsule Take by mouth. 03/18/19   [provider]  gabapentin (NEURONTIN) 100 MG capsule Take by mouth. 03/18/19   [provider]                                                                                                                                    Past Surgical History ** The histories are not reviewed yet. Please review them in the "History" navigator section and refresh this [place]. Family History No family history on file.  Social History Social History   Tobacco Use  . Smoking status: Never Smoker  . Smokeless tobacco: Never Used  Substance Use Topics  . Alcohol use: Not Currently  . Drug use: Yes    Types: IV, Methamphetamines    Comment: crystal meth; hasn't used in 3 days   Allergies Haloperidol  Review of Systems Review of Systems  Unable to perform ROS: Mental status change    Physical Exam Vital Signs  I have reviewed the triage vital signs BP 97/77 (BP Location: Right Arm)   Pulse  87   Temp (!) 97.5 F (36.4 C) (Oral)   Resp 13   SpO2 100%   Physical Exam Vitals reviewed.  Constitutional:      General: He is not in acute distress.    Appearance: He is well-developed. He is not diaphoretic.  HENT:     Head: Normocephalic and atraumatic.     Nose: Nose normal.  Eyes:     General: No scleral icterus.       Right eye: No discharge.        Left eye: No discharge.     Conjunctiva/sclera: Conjunctivae normal.     Pupils: Pupils are equal, round, and reactive to light.  Cardiovascular:     Rate and Rhythm: Regular rhythm. Tachycardia present.     Heart sounds: No murmur. No friction rub. No gallop.   Pulmonary:     Effort: Pulmonary effort is normal. No respiratory distress.     Breath sounds: Normal breath sounds. No stridor. No rales.  Abdominal:     General: There is no distension.     Palpations: Abdomen is soft.     Tenderness: There is no abdominal tenderness.    Musculoskeletal:        General: No tenderness.     Cervical back: Normal range of motion and neck supple.  Skin:    General: Skin is warm and dry.     Findings: No erythema or rash.  Neurological:     Mental Status: He is alert.  Psychiatric:        Mood and Affect: Mood is anxious.        Behavior: Behavior is agitated and hyperactive.     ED Results and Treatments Labs (all labs ordered are listed, but only abnormal results are displayed) Labs Reviewed  COMPREHENSIVE METABOLIC PANEL - Abnormal; Notable for the following components:      Result Value   Sodium 134 (*)    Potassium 3.3 (*)    Glucose, Bld 111 (*)    Calcium 8.8 (*)    Total Protein 6.3 (*)    Total Bilirubin 1.5 (*)    All other components within normal limits  SALICYLATE LEVEL - Abnormal; Notable for the following components:   Salicylate Lvl <7.0 (*)    All other components within normal limits  ACETAMINOPHEN LEVEL - Abnormal; Notable for the following components:   Acetaminophen (Tylenol), Serum <10 (*)    All other components within normal limits  RAPID URINE DRUG SCREEN, HOSP PERFORMED - Abnormal; Notable for the following components:   Amphetamines POSITIVE (*)    All other components within normal limits  CBG MONITORING, ED - Abnormal; Notable for the following components:   Glucose-Capillary 110 (*)    All other components within normal limits  ETHANOL  CBC  EKG  EKG Interpretation  Date/Time:  Tuesday September 01 2019 01:52:14 EST Ventricular Rate:  103 PR Interval:  122 QRS Duration: 82 QT Interval:  330 QTC Calculation: 432 R Axis:   67 Text Interpretation: Sinus tachycardia Cannot rule out Anterior infarct , age undetermined Abnormal ECG NO STEMI. No old tracing to compare Confirmed by Drema Pry [phone]) on 09/01/2019 3:10:27 AM      Radiology No results  found.  Pertinent labs & imaging results that were available during my care of the patient were reviewed by me and considered in my medical decision making (see chart for details).  Medications Ordered in ED Medications  LORazepam (ATIVAN) injection 2 mg (2 mg Intravenous Given 09/01/19 0238)  ziprasidone (GEODON) injection 20 mg (20 mg Intramuscular Given 09/01/19 0321)  ondansetron (ZOFRAN) injection 4 mg (4 mg Intravenous Given 09/01/19 0318)                                                                                                                                    Procedures .Critical Care Performed by: Nira Conn, MD Authorized by: Nira Conn, MD    CRITICAL CARE Performed by: Amadeo Garnet Meaghan Whistler Total critical care time: 30 minutes Critical care time was exclusive of separately billable procedures and treating other patients. Critical care was necessary to treat or prevent imminent or life-threatening deterioration. Critical care was time spent personally by me on the following activities: development of treatment plan with patient and/or surrogate as well as nursing, discussions with consultants, evaluation of patient's response to treatment, examination of patient, obtaining history from patient or surrogate, ordering and performing treatments and interventions, ordering and review of laboratory studies, ordering and review of radiographic studies, pulse oximetry and re-evaluation of patient's condition.    (including critical care time)  Medical Decision Making / ED Course I have reviewed the nursing notes for this encounter and the patient's prior records (if available in EHR or on provided paperwork).   Kenneth Hunt was evaluated in Emergency Department on 09/01/2019 for the symptoms described in the history of present illness. He was evaluated in the context of the global COVID-19 pandemic, which necessitated consideration that the patient  might be at risk for infection with the SARS-CoV-2 virus that causes COVID-19. Institutional protocols and algorithms that pertain to the evaluation of patients at risk for COVID-19 are in a state of rapid change based on information released by regulatory bodies including the CDC and federal and state organizations. These policies and algorithms were followed during the patient's care in the ED.  Excited delirium. Patient is tachycardic. Endorse methamphetamine use.  Required chemical sedation. Screening labs grossly reassuring.  Patient medically cleared. Plan to reassess once he wakes.  If patient is stable, not psychotic or manic, he would be appropriate for discharge as he denied any suicidal ideation or homicidal ideation.     Patient care turned over to  Dr Donnald Garre. Patient case and results discussed in detail; please see their note for further ED managment.       Final Clinical Impression(s) / ED Diagnoses Final diagnoses:  Substance abuse (HCC)  Delirium due to methamphetamine intoxication [facility])      This chart was dictated using voice recognition software.  Despite best efforts to proofread,  errors can occur which can change the documentation meaning.   Nira Conn, MD 09/01/19 [phone]

## 2019-09-01 NOTE — ED Notes (Signed)
Pt provided with [place] sandwich and sprite at this time.

## 2019-09-15 ENCOUNTER — Encounter: Payer: Self-pay | Admitting: Infectious Diseases

## 2019-10-07 ENCOUNTER — Emergency Department (HOSPITAL_COMMUNITY)
Admission: EM | Admit: 2019-10-07 | Discharge: 2019-10-07 | Disposition: A | Discharge status: Home / Self Care | Payer: Medicare Other | Attending: Emergency Medicine | Admitting: Emergency Medicine

## 2019-10-07 DIAGNOSIS — B20 Human immunodeficiency virus [HIV] disease: Secondary | ICD-10-CM | POA: Insufficient documentation

## 2019-10-07 DIAGNOSIS — Z79899 Other long term (current) drug therapy: Secondary | ICD-10-CM | POA: Insufficient documentation

## 2019-10-07 DIAGNOSIS — F1510 Other stimulant abuse, uncomplicated: Secondary | ICD-10-CM | POA: Insufficient documentation

## 2019-10-07 NOTE — ED Provider Notes (Signed)
[name] EMERGENCY DEPARTMENT Provider Note   CSN: [id number] Arrival date & time: 10/07/19  1249     History Chief Complaint  Patient presents with  . Chest Pain    Kenneth Hunt is a 39 y.o. male.  HPI Patient presents to the emergency department with methamphetamine usage.  The patient was using heavily over the last 12 hours.  The patient states he slammed a large dosage of methamphetamine and then started feeling like he was tweaking and called EMS.  Patient states that he uses heroin frequently.  He is very groggy from the midazolam that was given by EMS and does not give me much history otherwise.  The EMS gave me the majority of the history.    Past Medical History:  Diagnosis Date  . Brachial plexus disorders 07/31/2018   Seen by [facility] neurology. MRI brain and cervical spine showed bilateral neural foraminal cysts C4-C5, C5-C6, C6-C7, brain within normal limits.  - continue cymbalta, flexeril, neurontin - f/u with Dr. Debbra Riding [facility] neurology 03/28/19  . History of syphilis 12/30/2017   RPR 1:1 12/25/17 s/p adequate treatment    Patient Active Problem List   Diagnosis Date Noted  . Injection of illicit drug within last 12 months 08/26/2019  . Bipolar 2 disorder (HCC) 03/04/2019  . Human immunodeficiency virus (HIV) disease (HCC) 10/01/2018  . Thoracic outlet syndrome 10/01/2018  . 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 Methamphetamine dependence (HCC) 12/30/2017  . Moderate recurrent major depression (HCC) 12/30/2017  . History of syphilis 12/30/2017         No family history on file.  Social History   Tobacco Use  . Smoking status: Never Smoker  . Smokeless tobacco: Never Used  Substance Use Topics  . Alcohol use: Not Currently  . Drug use: Yes    Types: IV, Methamphetamines    Comment: crystal meth; hasn't used in 3 days    Home Medications Prior to Admission medications   Medication Sig Start Date End  Date Taking? Authorizing Provider  bictegravir-emtricitabine-tenofovir AF (BIKTARVY) 50-200-25 MG TABS tablet Take 1 tablet by mouth daily. Try to take at the same time each day with or without food. 07/23/19   Blanchard Kelch, NP  DULoxetine (CYMBALTA) 30 MG capsule Take 90 mg by mouth daily.  03/18/19   [provider]  gabapentin (NEURONTIN) 300 MG capsule Take 300 mg by mouth 3 (three) times daily.    [provider]    Allergies    Haloperidol  Review of Systems   Review of Systems Level 5 caveat applies due to drug impairment Physical Exam Updated Vital Signs BP 106/64   Pulse 78   Temp 98.4 F (36.9 C) (Axillary)   Resp 15   SpO2 96% Comment: Room air  Physical Exam Vitals and nursing note reviewed.  Constitutional:      General: He is not in acute distress.    Appearance: He is well-developed.  HENT:     Head: Normocephalic and atraumatic.  Eyes:     Pupils: Pupils are equal, round, and reactive to light.  Cardiovascular:     Rate and Rhythm: Normal rate and regular rhythm.     Heart sounds: Normal heart sounds. No murmur. No friction rub. No gallop.   Pulmonary:     Effort: Pulmonary effort is normal. No respiratory distress.     Breath sounds: Normal breath sounds. No wheezing.  Abdominal:     General: Bowel sounds are  normal. There is no distension.     Palpations: Abdomen is soft.     Tenderness: There is no abdominal tenderness.  Musculoskeletal:     Cervical back: Normal range of motion and neck supple.  Skin:    General: Skin is warm and dry.     Capillary Refill: Capillary refill takes less than 2 seconds.     Findings: No erythema or rash.  Neurological:     Mental Status: He is disoriented and unresponsive.     Motor: No abnormal muscle tone.     Coordination: Coordination normal.  Psychiatric:        Behavior: Behavior normal.     ED Results / Procedures / Treatments   Labs (all labs ordered are listed, but only abnormal  results are displayed) Labs Reviewed - No data to display  EKG None  Radiology No results found.  Procedures Procedures (including critical care time)  Medications Ordered in ED Medications - No data to display  ED Course  I have reviewed the triage vital signs and the nursing notes.  Pertinent labs & imaging results that were available during my care of the patient were reviewed by me and considered in my medical decision making (see chart for details).    MDM Rules/Calculators/A&P                     The patient will need to sober up and then most likely be discharged home.  He has been stable otherwise here in the emergency department.  He has been given midazolam and this was made him drowsy.  He will need reassessment Final Clinical Impression(s) / ED Diagnoses Final diagnoses:  None    Rx / DC Orders ED Discharge Orders    None       Dalia Heading, PA-C 10/07/19 1457    Davonna Belling, MD 10/08/19 [id number]

## 2019-10-07 NOTE — ED Notes (Signed)
Pt provided sandwich and drink. Pt alert and oriented X4. Denies chest pain at this time.

## 2019-10-07 NOTE — ED Triage Notes (Signed)
Pt arrives EMS with c/o chest pain and heart racing. Pt is reported to have "mainlined" meth and he then had chest pain with heart racing and SOB.  EMS gave 2.5mg  versed And 324mg  asa.   18g right AC

## 2019-10-13 ENCOUNTER — Other Ambulatory Visit: Payer: Self-pay

## 2019-10-13 ENCOUNTER — Emergency Department (HOSPITAL_COMMUNITY): Payer: Self-pay

## 2019-10-13 ENCOUNTER — Encounter (HOSPITAL_COMMUNITY): Payer: Self-pay

## 2019-10-13 ENCOUNTER — Emergency Department (HOSPITAL_COMMUNITY)
Admission: EM | Admit: 2019-10-13 | Discharge: 2019-10-13 | Disposition: A | Discharge status: Home / Self Care | Payer: Self-pay | Attending: Emergency Medicine | Admitting: Emergency Medicine

## 2019-10-13 DIAGNOSIS — Z21 Asymptomatic human immunodeficiency virus [HIV] infection status: Secondary | ICD-10-CM | POA: Insufficient documentation

## 2019-10-13 DIAGNOSIS — Z79899 Other long term (current) drug therapy: Secondary | ICD-10-CM | POA: Insufficient documentation

## 2019-10-13 DIAGNOSIS — R002 Palpitations: Secondary | ICD-10-CM

## 2019-10-13 DIAGNOSIS — Z20822 Contact with and (suspected) exposure to covid-19: Secondary | ICD-10-CM

## 2019-10-13 DIAGNOSIS — F1520 Other stimulant dependence, uncomplicated: Secondary | ICD-10-CM

## 2019-10-13 HISTORY — DX: Human immunodeficiency virus (HIV) disease: B20

## 2019-10-13 HISTORY — DX: Asymptomatic human immunodeficiency virus (hiv) infection status: Z21

## 2019-10-13 LAB — BASIC METABOLIC PANEL
Anion gap: 11 (ref 5–15)
BUN: 13 mg/dL (ref 6–20)
CO2: 22 mmol/L (ref 22–32)
Calcium: 8.7 mg/dL — ABNORMAL LOW (ref 8.9–10.3)
Chloride: 101 mmol/L (ref 98–111)
Creatinine, Ser: 0.84 mg/dL (ref 0.61–1.24)
GFR calc Af Amer: >60 mL/min (ref >60)
GFR calc non Af Amer: >60 mL/min (ref >60)
Glucose, Bld: 89 mg/dL (ref 70–99)
Potassium: 5.1 mmol/L (ref 3.5–5.1)
Sodium: 134 mmol/L — ABNORMAL LOW (ref 135–145)

## 2019-10-13 LAB — URINALYSIS, ROUTINE W REFLEX MICROSCOPIC
Bilirubin Urine: NEGATIVE
Glucose, UA: NEGATIVE mg/dL
Hgb urine dipstick: NEGATIVE
Ketones, ur: NEGATIVE mg/dL
Leukocytes,Ua: NEGATIVE
Nitrite: NEGATIVE
Protein, ur: NEGATIVE mg/dL
Specific Gravity, Urine: 1.001 — ABNORMAL LOW (ref 1.005–1.030)
pH: 7 (ref 5.0–8.0)

## 2019-10-13 LAB — HEPATIC FUNCTION PANEL
ALT: 37 U/L (ref 0–44)
AST: 52 U/L — ABNORMAL HIGH (ref 15–41)
Albumin: 3.6 g/dL (ref 3.5–5.0)
Alkaline Phosphatase: 68 U/L (ref 38–126)
Bilirubin, Direct: 0.2 mg/dL (ref 0.0–0.2)
Indirect Bilirubin: 0.4 mg/dL (ref 0.3–0.9)
Total Bilirubin: 0.6 mg/dL (ref 0.3–1.2)
Total Protein: 6.6 g/dL (ref 6.5–8.1)

## 2019-10-13 LAB — RAPID URINE DRUG SCREEN, HOSP PERFORMED
Amphetamines: POSITIVE — AB
Barbiturates: NOT DETECTED
Benzodiazepines: NOT DETECTED
Cocaine: NOT DETECTED
Opiates: NOT DETECTED
Tetrahydrocannabinol: NOT DETECTED

## 2019-10-13 LAB — TROPONIN I (HIGH SENSITIVITY)
Troponin I (High Sensitivity): 2 ng/L (ref <18)
Troponin I (High Sensitivity): 3 ng/L (ref <18)

## 2019-10-13 LAB — CBC
HCT: 42.8 % (ref 39.0–52.0)
Hemoglobin: 14.5 g/dL (ref 13.0–17.0)
MCH: 30.9 pg (ref 26.0–34.0)
MCHC: 33.9 g/dL (ref 30.0–36.0)
MCV: 91.3 fL (ref 80.0–100.0)
Platelets: 289 10*3/uL (ref 150–400)
RBC: 4.69 MIL/uL (ref 4.22–5.81)
RDW: 13 % (ref 11.5–15.5)
WBC: 6.3 10*3/uL (ref 4.0–10.5)
nRBC: 0 % (ref 0.0–0.2)

## 2019-10-13 LAB — CK: Total CK: 580 U/L — ABNORMAL HIGH (ref 49–397)

## 2019-10-13 LAB — ETHANOL: Alcohol, Ethyl (B): <10 mg/dL (ref <10)

## 2019-10-13 LAB — SALICYLATE LEVEL: Salicylate Lvl: <7 mg/dL — ABNORMAL LOW (ref 7.0–30.0)

## 2019-10-13 MED ORDER — SODIUM CHLORIDE 0.9 % IV BOLUS
1000.0000 mL | Freq: Once | INTRAVENOUS | Status: AC
  Administered 2019-10-13: 1000 mL via INTRAVENOUS

## 2019-10-13 NOTE — ED Provider Notes (Signed)
[facility] EMERGENCY DEPARTMENT Provider Note   CSN: [id number] Arrival date & time: 10/13/19  1849     History Chief Complaint  Patient presents with  . Palpitations    Bertil Granda is a 39 y.o. male hx of HIV, bipolar, amphetamine dependence, here presenting with palpitations and chest heaviness.  Patient states that he has been using meth and he is addicted to it.  He last used it last night and today he has been having persistent palpitations.  He states that he just feels that his heart is racing.  He also feels very anxious.  He denies any fevers or chills.  He states that he has been around a lot of people and wants to get tested with Covid.  Denies any shortness of breath.  He states that he has HIV and is compliant with his meds.  He is CD4 count is normal in January of this year.  The history is provided by the patient.       Past Medical History:  Diagnosis Date  . 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 MRI brain and cervical spine showed bilateral neural foraminal cysts C4-C5, C5-C6, C6-C7, brain within normal limits.  - continue cymbalta, flexeril, neurontin - f/u with Dr. Debbra Riding [facility] neurology 03/28/19  . 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 HIV (human immunodeficiency virus infection) [facility])     Patient Active Problem List   Diagnosis Date Noted  . Injection of illicit drug within last 12 months 08/26/2019  . Bipolar 2 disorder (HCC) 03/04/2019  . Human immunodeficiency virus (HIV) disease (HCC) 10/01/2018  . Thoracic outlet syndrome 10/01/2018  . 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 Methamphetamine dependence (HCC) 12/30/2017  . Moderate recurrent major depression (HCC) 12/30/2017  . History of syphilis 12/30/2017    History reviewed. No pertinent surgical history.     History reviewed. No pertinent family history.  Social History   Tobacco  Use  . Smoking status: Never Smoker  . Smokeless tobacco: Never Used  Substance Use Topics  . Alcohol use: Not Currently  . Drug use: Yes    Types: IV, Methamphetamines    Comment: crystal meth; hasn't used in 3 days    Home Medications Prior to Admission medications   Medication Sig Start Date End Date Taking? Authorizing Provider  bictegravir-emtricitabine-tenofovir AF (BIKTARVY) 50-200-25 MG TABS tablet Take 1 tablet by mouth daily. Try to take at the same time each day with or without food. 07/23/19  Yes Blanchard Kelch, NP  DULoxetine (CYMBALTA) 30 MG capsule Take 90 mg by mouth daily.  03/18/19  Yes [provider]  gabapentin (NEURONTIN) 300 MG capsule Take 300 mg by mouth 3 (three) times daily.   Yes [provider]    Allergies    Haloperidol  Review of Systems   Review of Systems  Cardiovascular: Positive for palpitations.  All other systems reviewed and are negative.   Physical Exam Updated Vital Signs BP (!) 114/57   Pulse (!) 101   Temp 98.6 F (37 C) (Oral)   Resp (!) 22   Ht 5\' 3"  (1.6 m)   Wt 72.6 kg   SpO2 99%   BMI 28.34 kg/m   Physical Exam Vitals and nursing note reviewed.  Constitutional:      Appearance: Normal appearance.  HENT:     Head: Normocephalic.     Nose: Nose normal.  Mouth/Throat:     Mouth: Mucous membranes are moist.  Eyes:     Extraocular Movements: Extraocular movements intact.     Pupils: Pupils are equal, round, and reactive to light.  Cardiovascular:     Rate and Rhythm: Normal rate and regular rhythm.     Pulses: Normal pulses.  Pulmonary:     Effort: Pulmonary effort is normal.  Abdominal:     General: Abdomen is flat.     Palpations: Abdomen is soft.  Musculoskeletal:        General: Normal range of motion.     Cervical back: Normal range of motion.  Skin:    General: Skin is warm.     Capillary Refill: Capillary refill takes less than 2 seconds.  Neurological:     General: No focal  deficit present.     Mental Status: He is alert and oriented to person, place, and time.  Psychiatric:        Mood and Affect: Mood normal.        Behavior: Behavior normal.     ED Results / Procedures / Treatments   Labs (all labs ordered are listed, but only abnormal results are displayed) Labs Reviewed  BASIC METABOLIC PANEL - Abnormal; Notable for the following components:      Result Value   Sodium 134 (*)    Calcium 8.7 (*)    All other components within normal limits  RAPID URINE DRUG SCREEN, HOSP PERFORMED - Abnormal; Notable for the following components:   Amphetamines POSITIVE (*)    All other components within normal limits  SALICYLATE LEVEL - Abnormal; Notable for the following components:   Salicylate Lvl <1.6 (*)    All other components within normal limits  HEPATIC FUNCTION PANEL - Abnormal; Notable for the following components:   AST 52 (*)    All other components within normal limits  URINALYSIS, ROUTINE W REFLEX MICROSCOPIC - Abnormal; Notable for the following components:   Color, Urine COLORLESS (*)    Specific Gravity, Urine 1.001 (*)    All other components within normal limits  CK - Abnormal; Notable for the following components:   Total CK 580 (*)    All other components within normal limits  SARS CORONAVIRUS 2 (TAT 6-24 HRS)  CBC  ETHANOL  TROPONIN I (HIGH SENSITIVITY)  TROPONIN I (HIGH SENSITIVITY)    EKG EKG Interpretation  Date/Time:  Tuesday October 13 2019 18:56:24 EST Ventricular Rate:  96 PR Interval:  128 QRS Duration: 86 QT Interval:  346 QTC Calculation: 437 R Axis:   52 Text Interpretation: Normal sinus rhythm Normal ECG No significant change since last tracing Confirmed by Wandra Arthurs [phone]) on 10/13/2019 8:47:42 PM   Radiology DG Chest 2 View  Result Date: 10/13/2019 CLINICAL DATA:  Chest pain, history of tachycardia and recent crystal meth use EXAM: CHEST - 2 VIEW COMPARISON:  None. FINDINGS: Cardiac shadows within normal  limits. The lungs are well aerated bilaterally. No focal infiltrate or sizable effusion is seen. No bony abnormality is noted. IMPRESSION: No active cardiopulmonary disease. Electronically Signed   By: Inez Catalina M.D.   On: 10/13/2019 19:24    Procedures Procedures (including critical care time)  Medications Ordered in ED Medications  sodium chloride 0.9 % bolus 1,000 mL (has no administration in time range)    ED Course  I have reviewed the triage vital signs and the nursing notes.  Pertinent labs & imaging results that were available during my  care of the patient were reviewed by me and considered in my medical decision making (see chart for details).    MDM Rules/Calculators/A&P                      Elder Duffee is a 40 y.o. male here with palpitations.  Patient does use methamphetamine.  Patient has some palpitations subjectively but his heart rate is 90s to 100.  I think he is likely having side effect from his methamphetamine use.  He does want to quit but states that has been very difficult.  He denies any thoughts of harming himself or others.  Will get labs and hydrate patient and reassess.  10:42 PM CK is 580. Labs otherwise unremarkable. Trop neg x 2. Told him to stop taking amphetamines. Given list of outpatient resources. Stable for discharge   Final Clinical Impression(s) / ED Diagnoses Final diagnoses:  None    Rx / DC Orders ED Discharge Orders    None       Charlynne Pander, MD 10/13/19 2242

## 2019-10-13 NOTE — Discharge Instructions (Addendum)
Stay hydrated   See your doctor   Stop using amphetamine.   See outpatient resources   You are tested for COVID. You will be called if you have positive result. You can also check on Mychart. Stay home until you have results back   See your doctor   Return to ER if you have worse palpitations, chest pain, trouble breathing, fever

## 2019-10-13 NOTE — ED Triage Notes (Signed)
Pt arrives POV for eval of palpitations and chest heaviness since using meth last night. Pt reports that he feels as though his heart is racing, denies drug use today.

## 2019-10-14 LAB — SARS CORONAVIRUS 2 (TAT 6-24 HRS): SARS Coronavirus 2: NEGATIVE

## 2019-10-31 ENCOUNTER — Other Ambulatory Visit: Payer: Self-pay

## 2019-10-31 ENCOUNTER — Emergency Department (HOSPITAL_COMMUNITY)
Admission: EM | Admit: 2019-10-31 | Discharge: 2019-11-02 | Disposition: A | Discharge status: Home / Self Care | Payer: Self-pay | Attending: Emergency Medicine | Admitting: Emergency Medicine

## 2019-10-31 ENCOUNTER — Encounter (HOSPITAL_COMMUNITY): Payer: Self-pay | Admitting: *Deleted

## 2019-10-31 DIAGNOSIS — Z21 Asymptomatic human immunodeficiency virus [HIV] infection status: Secondary | ICD-10-CM | POA: Insufficient documentation

## 2019-10-31 DIAGNOSIS — U071 COVID-19: Secondary | ICD-10-CM | POA: Insufficient documentation

## 2019-10-31 DIAGNOSIS — F319 Bipolar disorder, unspecified: Secondary | ICD-10-CM | POA: Insufficient documentation

## 2019-10-31 DIAGNOSIS — R45851 Suicidal ideations: Secondary | ICD-10-CM | POA: Insufficient documentation

## 2019-10-31 DIAGNOSIS — F1594 Other stimulant use, unspecified with stimulant-induced mood disorder: Secondary | ICD-10-CM | POA: Insufficient documentation

## 2019-10-31 DIAGNOSIS — Z79899 Other long term (current) drug therapy: Secondary | ICD-10-CM | POA: Insufficient documentation

## 2019-10-31 LAB — RAPID URINE DRUG SCREEN, HOSP PERFORMED
Amphetamines: POSITIVE — AB
Barbiturates: NOT DETECTED
Benzodiazepines: NOT DETECTED
Cocaine: NOT DETECTED
Opiates: NOT DETECTED
Tetrahydrocannabinol: NOT DETECTED

## 2019-10-31 LAB — COMPREHENSIVE METABOLIC PANEL
ALT: 15 U/L (ref 0–44)
AST: 31 U/L (ref 15–41)
Albumin: 4.1 g/dL (ref 3.5–5.0)
Alkaline Phosphatase: 79 U/L (ref 38–126)
Anion gap: 13 (ref 5–15)
BUN: 13 mg/dL (ref 6–20)
CO2: 24 mmol/L (ref 22–32)
Calcium: 9.4 mg/dL (ref 8.9–10.3)
Chloride: 100 mmol/L (ref 98–111)
Creatinine, Ser: 1.04 mg/dL (ref 0.61–1.24)
GFR calc Af Amer: >60 mL/min (ref >60)
GFR calc non Af Amer: >60 mL/min (ref >60)
Glucose, Bld: 121 mg/dL — ABNORMAL HIGH (ref 70–99)
Potassium: 4.9 mmol/L (ref 3.5–5.1)
Sodium: 137 mmol/L (ref 135–145)
Total Bilirubin: 1.1 mg/dL (ref 0.3–1.2)
Total Protein: 7.6 g/dL (ref 6.5–8.1)

## 2019-10-31 LAB — CBC
HCT: 47.1 % (ref 39.0–52.0)
Hemoglobin: 16 g/dL (ref 13.0–17.0)
MCH: 30.6 pg (ref 26.0–34.0)
MCHC: 34 g/dL (ref 30.0–36.0)
MCV: 90.1 fL (ref 80.0–100.0)
Platelets: 346 10*3/uL (ref 150–400)
RBC: 5.23 MIL/uL (ref 4.22–5.81)
RDW: 12.6 % (ref 11.5–15.5)
WBC: 11.1 10*3/uL — ABNORMAL HIGH (ref 4.0–10.5)
nRBC: 0 % (ref 0.0–0.2)

## 2019-10-31 LAB — RESPIRATORY PANEL BY RT PCR (FLU A&B, COVID)
Influenza A by PCR: NEGATIVE
Influenza B by PCR: NEGATIVE
SARS Coronavirus 2 by RT PCR: POSITIVE — AB

## 2019-10-31 LAB — ACETAMINOPHEN LEVEL: Acetaminophen (Tylenol), Serum: <10 ug/mL — ABNORMAL LOW (ref 10–30)

## 2019-10-31 LAB — ETHANOL: Alcohol, Ethyl (B): <10 mg/dL (ref <10)

## 2019-10-31 LAB — SALICYLATE LEVEL: Salicylate Lvl: <7 mg/dL — ABNORMAL LOW (ref 7.0–30.0)

## 2019-10-31 MED ORDER — LORAZEPAM 2 MG/ML IJ SOLN
2.0000 mg | Freq: Once | INTRAMUSCULAR | Status: AC
  Administered 2019-10-31: 2 mg via INTRAMUSCULAR
  Filled 2019-10-31: qty 1

## 2019-10-31 MED ORDER — BICTEGRAVIR-EMTRICITAB-TENOFOV 50-200-25 MG PO TABS
1.0000 | ORAL_TABLET | Freq: Every day | ORAL | Status: DC
  Administered 2019-11-01: 1 via ORAL
  Filled 2019-10-31 (×3): qty 1

## 2019-10-31 MED ORDER — ZIPRASIDONE MESYLATE 20 MG IM SOLR
20.0000 mg | Freq: Once | INTRAMUSCULAR | Status: AC
  Administered 2019-10-31: 20 mg via INTRAMUSCULAR
  Filled 2019-10-31: qty 10

## 2019-10-31 NOTE — ED Notes (Signed)
Lunch tray ordered 

## 2019-10-31 NOTE — ED Notes (Signed)
Lab called to advise pt is COVID-19 Positive - Message sent to Dr Freida Busman.

## 2019-10-31 NOTE — ED Notes (Addendum)
Pt arrived to [facility] - ambulatory - wearing burgundy and blue scrubs - Pt noted to be lying on bed - woke pt for TTS. TTS being performed. No Sitter available for pt per Staffing Office. J Blue-Matthews, Consulting civil engineer, aware.

## 2019-10-31 NOTE — ED Notes (Addendum)
Pt laid down on bed prior to injections being administered. Security stood by. Pt put face mask on as requested. Pt tolerated injections well - requested for both to be placed in left hip.

## 2019-10-31 NOTE — ED Notes (Signed)
Dinner tray delivered to pt - Pt declined to eat - returned to sleeping. Pt noted to be covered head-to-toe w/fitted sheet. Respirations even, unlabored.

## 2019-10-31 NOTE — ED Notes (Signed)
Lab called and advised is correcting ALT And T Bili on lab results.

## 2019-10-31 NOTE — ED Triage Notes (Signed)
Pt brought in by PD, found walking down middle of road wearing dark clothing (multple calls to 911). He was looking over a bridge, like he was getting ready to jump. Back pack was found beneath the bridge. Pt initially became tense, and clenched his fist,  started walking away, detained. Non verbal for PD.

## 2019-10-31 NOTE — ED Provider Notes (Signed)
[facility] EMERGENCY DEPARTMENT Provider Note   CSN: [id number] Arrival date & time: 10/31/19  7353     History Chief Complaint  Patient presents with  . Psychiatric Evaluation    Jaquaveon Vanmetre is a 39 y.o. male.  39 year old male with history of bipolar disorder presents after being found by police by bridge.  Patient admits he was going to jump off the bridge to commit suicide.  Patient denies responding to internal stimuli.  Denies any recent use of alcohol or illicit drugs.  States he has been more depressed recently.  Does have a history of suicide attempt.  Refuses to give any further history        Past Medical History:  Diagnosis Date  . 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 MRI brain and cervical spine showed bilateral neural foraminal cysts C4-C5, C5-C6, C6-C7, brain within normal limits.  - continue cymbalta, flexeril, neurontin - f/u with Dr. Debbra Riding [facility] neurology 03/28/19  . 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 HIV (human immunodeficiency virus infection) [facility])     Patient Active Problem List   Diagnosis Date Noted  . Injection of illicit drug within last 12 months 08/26/2019  . Bipolar 2 disorder (HCC) 03/04/2019  . Human immunodeficiency virus (HIV) disease (HCC) 10/01/2018  . Thoracic outlet syndrome 10/01/2018  . 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 Methamphetamine dependence (HCC) 12/30/2017  . Moderate recurrent major depression (HCC) 12/30/2017  . History of syphilis 12/30/2017    History reviewed. No pertinent surgical history.     No family history on file.  Social History   Tobacco Use  . Smoking status: Never Smoker  . Smokeless tobacco: Never Used  Substance Use Topics  . Alcohol use: Not Currently  . Drug use: Yes    Types: IV, Methamphetamines    Comment: crystal meth; hasn't used in 3 days    Home Medications Prior  to Admission medications   Medication Sig Start Date End Date Taking? Authorizing Provider  bictegravir-emtricitabine-tenofovir AF (BIKTARVY) 50-200-25 MG TABS tablet Take 1 tablet by mouth daily. Try to take at the same time each day with or without food. 07/23/19   Blanchard Kelch, NP  DULoxetine (CYMBALTA) 30 MG capsule Take 90 mg by mouth daily.  03/18/19   [provider]  gabapentin (NEURONTIN) 300 MG capsule Take 300 mg by mouth 3 (three) times daily.    [provider]    Allergies    Haloperidol  Review of Systems   Review of Systems  Unable to perform ROS: Psychiatric disorder    Physical Exam Updated Vital Signs BP 132/80 (BP Location: Right Arm)   Pulse (!) 103   Temp 98 F (36.7 C) (Oral)   Resp 15   SpO2 98%   Physical Exam Vitals and nursing note reviewed.  Constitutional:      General: He is not in acute distress.    Appearance: Normal appearance. He is well-developed. He is not toxic-appearing.  HENT:     Head: Normocephalic and atraumatic.  Eyes:     General: Lids are normal.     Conjunctiva/sclera: Conjunctivae normal.     Pupils: Pupils are equal, round, and reactive to light.  Neck:     Thyroid: No thyroid mass.     Trachea: No tracheal deviation.  Cardiovascular:     Rate and Rhythm: Normal rate and regular rhythm.  Heart sounds: Normal heart sounds. No murmur. No gallop.   Pulmonary:     Effort: Pulmonary effort is normal. No respiratory distress.     Breath sounds: Normal breath sounds. No stridor. No decreased breath sounds, wheezing, rhonchi or rales.  Abdominal:     General: Bowel sounds are normal. There is no distension.     Palpations: Abdomen is soft.     Tenderness: There is no abdominal tenderness. There is no rebound.  Musculoskeletal:        General: No tenderness. Normal range of motion.     Cervical back: Normal range of motion and neck supple.  Skin:    General: Skin is warm and dry.     Findings: No  abrasion or rash.  Neurological:     Mental Status: He is alert and oriented to person, place, and time.     GCS: GCS eye subscore is 4. GCS verbal subscore is 5. GCS motor subscore is 6.     Cranial Nerves: No cranial nerve deficit.     Sensory: No sensory deficit.  Psychiatric:        Attention and Perception: He is inattentive.        Mood and Affect: Affect is flat.        Speech: Speech normal.        Behavior: Behavior normal.        Thought Content: Thought content includes suicidal ideation. Thought content includes suicidal plan.     ED Results / Procedures / Treatments   Labs (all labs ordered are listed, but only abnormal results are displayed) Labs Reviewed  CBC - Abnormal; Notable for the following components:      Result Value   WBC 11.1 (*)    All other components within normal limits  COMPREHENSIVE METABOLIC PANEL  ETHANOL  SALICYLATE LEVEL  ACETAMINOPHEN LEVEL  RAPID URINE DRUG SCREEN, HOSP PERFORMED    EKG None  Radiology No results found.  Procedures Procedures (including critical care time)  Medications Ordered in ED Medications - No data to display  ED Course  I have reviewed the triage vital signs and the nursing notes.  Pertinent labs & imaging results that were available during my care of the patient were reviewed by me and considered in my medical decision making (see chart for details).    MDM Rules/Calculators/A&P                      Patient will be medically clear for psychiatric disposition Final Clinical Impression(s) / ED Diagnoses Final diagnoses:  None    Rx / DC Orders ED Discharge Orders    None       Lacretia Leigh, MD 10/31/19 0730

## 2019-10-31 NOTE — BH Assessment (Signed)
Tele Assessment Note   Patient Name: Kenneth Hunt MRN: [id number] Referring Physician: Freida Busman Location of Patient: [facility] Location of Provider: Behavioral Health TTS Department  Kenneth Hunt is an 39 y.o. male who was brought to [facility] after having been found close to a bridge by the police with suicidal ideation with a plan to jump.  Patient states that he attempted suicide in the past by overdose in 2013.  Patient is a former Charity fundraiser, who is disabled with some form of medical issue that he states keeps his hands from functioning properly.  He also has been diagnosed with bipolar disorder in the past and has HIV.  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  He states that he has been hanging around with the wrong people and doing things more often than he should.  (evidently drugs and sex).  Patient states that his parents are deceased and states that he has no emotional support other than his three dogs.  He eludes that he has been in previous relationships with others who have let him down and he states that he really has no trust for others and he states that he does not feel like people need to be trying to make decisions for him.    Patient states that he is not really depressed, he is not stressed.  He states, "I just want to die."  He states that It is not a depressed feeling, "just a certainty." Patient states that he has been feeling this way for a couple years now, but his thoughts of killing himself have worsened over the past two months.  Patient states that the only reason that he did not jump last night was fear that he would not be successful in his suicide attempt and that he would be in physical pain and still not accomplish what he set out to do.  Patient states that he  that he wants to die and "I am positive that is what I want."  He further states, "There is nothing here for  me in this world and I am kind of over it." Patient states that "I am building up the courage to do it and I know that I eventually will do it."  He states that he wishes that there was a law where he could be euthanized and his wishes be granted rather than "professional do-gooders making decisions concerning my life."  Patient states that he received outpatient BH services in 2013 after he attempted suicide.  However, he states that he has no current provider and states that he is currently not on any medications for depression.  Patient denies HI/Psychosis and states that for the most part that he has been sleeping and eating well.  Patient states that he has been using methamphetamines for the past five years.  He states that he is currently using on two to three occasions per week, but will not elaborate on how much he is using each time, but does admit that he has experienced an increase in tolerance.  Patient states that he used to use heroin in the past, but did not provide any specifics.  Patient presented as oriented and alert.  He was tangential when speaking of death and his presentation was very dramatic when discussing his views on death and eternal life.  TTS could not determine if his thoughts were spiritually driven based on his  beliefs of if he was seeking either a reaction or sympathy.  His thoughts were relatively organized.  His mood was depressed and his affect mostly flat the majority of the assessment process.  His memory appeared to be intact, but he was selective in what he was willing to share.  He repeated several times, "people cannot understand what I am feeling."  Patient's eye contact was poor, but his speech coherent.  He did not appear to be responding to any internal stimuli.  His judgment and impulse control appear to be impaired, but his insight is relatively good.    Diagnosis: F31.9 Bipolar Disorder Depressed  Past Medical History:  Past Medical History:   Diagnosis Date  . 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 MRI brain and cervical spine showed bilateral neural foraminal cysts C4-C5, C5-C6, C6-C7, brain within normal limits.  - continue cymbalta, flexeril, neurontin - f/u with Dr. Elana Alm [facility] neurology 03/28/19  . 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 HIV (human immunodeficiency virus infection) ([place])     History reviewed. No pertinent surgical history.  Family History: No family history on file.  Social History:  reports that he has never smoked. He has never used smokeless tobacco. He reports previous alcohol use. He reports current drug use. Drugs: IV and Methamphetamines.  Additional Social History:  Alcohol / Drug Use Pain Medications: see MAR Prescriptions: see MAR Over the Counter: see MAR History of alcohol / drug use?: Yes Longest period of sobriety (when/how long): none reported Negative Consequences of Use: Financial, Personal relationships Substance #1 Name of Substance 1: methamphetamine 1 - Age of First Use: 33 1 - Amount (size/oz): undisclosed 1 - Frequency: 2-3 times weekly 1 - Duration: since onset 1 - Last Use / Amount: UTA  CIWA: CIWA-Ar BP: 132/80 Pulse Rate: (!) 103 COWS:    Allergies:  Allergies  Allergen Reactions  . Haloperidol Other (See Comments)    Tardive dyskinesia    Home Medications: (Not in a hospital admission)   OB/GYN Status:  No LMP for male patient.  General Assessment Data TTS Assessment: In system Is this a Tele or Face-to-Face Assessment?: Tele Assessment Is this an Initial Assessment or a Re-assessment for this encounter?: Initial Assessment Patient Accompanied by:: Other(police) Language Other than English: No Living Arrangements: Other (Comment)(has own apartment) What gender do you identify as?: Male Marital status: Single Living Arrangements: Alone Can pt return to current living  arrangement?: No Admission Status: Involuntary Petitioner: ED Attending Is patient capable of signing voluntary admission?: Yes Referral Source: Self/Family/Friend Insurance type: not on file     Crisis Care Plan Living Arrangements: Alone Legal Guardian: Other:(self) Name of Psychiatrist: none Name of Therapist: none  Education Status Is patient currently in school?: No Is the patient employed, unemployed or receiving disability?: Receiving disability income  Risk to self with the past 6 months Suicidal Ideation: Yes-Currently Present Has patient been a risk to self within the past 6 months prior to admission? : Yes Suicidal Intent: Yes-Currently Present Has patient had any suicidal intent within the past 6 months prior to admission? : Yes Is patient at risk for suicide?: Yes Suicidal Plan?: Yes-Currently Present Has patient had any suicidal plan within the past 6 months prior to admission? : Yes Specify Current Suicidal Plan: jump off bridge Access to Means: Yes Specify Access to Suicidal Means: traffic bridge What has been your use of drugs/alcohol within the  last 12 months?: yes Previous Attempts/Gestures: Yes How many times?: 1(overdose) Other Self Harm Risks: (minimal support) Triggers for Past Attempts: None known Intentional Self Injurious Behavior: None Family Suicide History: Unknown Recent stressful life event(s): Financial Problems, Other (Comment)(minimal support) Persecutory voices/beliefs?: No Depression: Yes Depression Symptoms: Despondent, Isolating, Loss of interest in usual pleasures, Feeling worthless/self pity Substance abuse history and/or treatment for substance abuse?: Yes Suicide prevention information given to non-admitted patients: Not applicable  Risk to Others within the past 6 months Homicidal Ideation: No Does patient have any lifetime risk of violence toward others beyond the six months prior to admission? : No Thoughts of Harm to Others:  No Current Homicidal Intent: No Current Homicidal Plan: No Access to Homicidal Means: No Identified Victim: none History of harm to others?: No Assessment of Violence: None Noted Violent Behavior Description: none Does patient have access to weapons?: No Criminal Charges Pending?: No Does patient have a court date: No Is patient on probation?: No  Psychosis Hallucinations: None noted Delusions: None noted  Mental Status Report Appearance/Hygiene: Unremarkable Eye Contact: Poor Motor Activity: Freedom of movement Speech: Logical/coherent Level of Consciousness: Alert Mood: Depressed, Apathetic Affect: Appropriate to circumstance Anxiety Level: Moderate Thought Processes: Coherent, Relevant  Cognitive Functioning Concentration: Normal Memory: Recent Intact, Remote Intact Is patient IDD: No Insight: Good Impulse Control: Poor Appetite: Good Have you had any weight changes? : Loss Sleep: No Change Total Hours of Sleep: 8 Vegetative Symptoms: None  ADLScreening [facility] Assessment Services) Patient's cognitive ability adequate to safely complete daily activities?: Yes Patient able to express need for assistance with ADLs?: Yes Independently performs ADLs?: Yes (appropriate for developmental age)  Prior Inpatient Therapy Prior Inpatient Therapy: No  Prior Outpatient Therapy Prior Outpatient Therapy: Yes Prior Therapy Dates: 2013 Prior Therapy Facilty/Provider(s): [facility] Reason for Treatment: depression Does patient have an ACCT team?: No Does patient have Intensive In-House Services?  : No Does patient have [facility] services? : No Does patient have P4CC services?: No  ADL Screening (condition at time of admission) Patient's cognitive ability adequate to safely complete daily activities?: Yes Is the patient deaf or have difficulty hearing?: No Does the patient have difficulty seeing, even when wearing glasses/contacts?: No Does the patient have difficulty  concentrating, remembering, or making decisions?: No Patient able to express need for assistance with ADLs?: Yes Does the patient have difficulty dressing or bathing?: No Independently performs ADLs?: Yes (appropriate for developmental age) Does the patient have difficulty walking or climbing stairs?: No Weakness of Legs: None Weakness of Arms/Hands: None  Home Assistive Devices/Equipment Home Assistive Devices/Equipment: None  Therapy Consults (therapy consults require a physician order) PT Evaluation Needed: No OT Evalulation Needed: No SLP Evaluation Needed: No Abuse/Neglect Assessment (Assessment to be complete while patient is alone) Abuse/Neglect Assessment Can Be Completed: Unable to assess, patient is non-responsive or altered mental status(patient is not willing to share at this time) Values / Beliefs Cultural Requests During Hospitalization: None Spiritual Requests During Hospitalization: None Consults Spiritual Care Consult Needed: No Transition of Care Team Consult Needed: No Advance Directives (For Healthcare) Does Patient Have a Medical Advance Directive?: No Would patient like information on creating a medical advance directive?: No - Patient declined Nutrition Screen- [facility] Adult/WL/AP Has the patient recently lost weight without trying?: No Has the patient been eating poorly because of a decreased appetite?: No Malnutrition Screening Tool Score: 0        Disposition: Per Hillery Jacks, NP, Inpatient is recommended Disposition Initial Assessment Completed  for this Encounter: Yes  This service was provided via telemedicine using a 2-way, interactive audio and video technology.  Names of all persons participating in this telemedicine service and their role in this encounter. Name: Kenneth Hunt Role: patient  Name: Dia Jefferys Role: TTS  Name:  Role:   Name:  Role:     Daphene Calamity 10/31/2019 9:37 AM

## 2019-10-31 NOTE — ED Notes (Signed)
IVC paperwork - Copy faxed to [facility] - Copy sent to Medical Records - Original placed in folder for Magistrate - ALL 3 sets on clipboard.  

## 2019-10-31 NOTE — ED Notes (Signed)
[employer] discarded two needle syringes from pt belongings in sharps box

## 2019-10-31 NOTE — ED Triage Notes (Addendum)
Handcuffs removed by PD in triage. Pt sitting in triage chair, eyes open, looking down at his feet. Pt does not answer any of my questions. He is currently cooperating with staff for vitals and blood work. Abrasions and blood to right hand, unwilling to answer questions pertaining to the same.

## 2019-10-31 NOTE — Progress Notes (Signed)
Patient meets criteria for inpatient treatment per Hillery Jacks, NP. No appropriate beds at [facility] currently. CSW faxed referrals to the following facilities for review:  [facility]  [facility]  [facility]   [facility]   [facility]   [facility]  [facility]   [facility]  [facility]  [facility]   [facility]  [facility]   [facility]  [facility]  [facility]   [facility]   [facility]   TTS will continue to seek bed placement.     Ruthann Cancer MSW, [facility] Clincal Social Worker Disposition  [facility] Ph: [phone] Fax: [phone] 10/31/2019 11:19 AM

## 2019-10-31 NOTE — ED Notes (Addendum)
Pt ambulated to bathroom then stood outside of door stating he wants to leave. Pt refusing to wear face mask and refusing to return to room. States he received COVID vaccine yesterday. Pt stating "Then you're going to have to give me a shot or tie me down". Security and Off-Duty [employer] assisting w/de-escalating pt. Chilton Greathouse, BH NP, aware - order received for Geodon 20mg  IM and Ativan 2mg  IM now. Pt had also removed sheets from bed.

## 2019-10-31 NOTE — ED Notes (Signed)
Pt aware COVID-19 positive. Pt initially refusing to answer RN's questions if he had been around anyone who is COVID positive and if he has had or presently has symptoms. States "You need to go do your research and talk to the other people because they know". Pt refused to specify who "they" is/are. Pt then advised he does not know if he has been around anyone and denies symptoms. Pt declined to eat breakfast.

## 2019-10-31 NOTE — Progress Notes (Signed)
Pt denied placement at [facility] due to no current bed availability.   [place] MSW, LCSWA Clincal Social Worker Disposition  [facility] Ph: [phone] Fax: [phone]  

## 2019-11-01 MED ORDER — LORAZEPAM 1 MG PO TABS: 2.0000 mg | ORAL_TABLET | Freq: Once | ORAL | Status: AC

## 2019-11-01 MED ORDER — RISPERIDONE 1 MG PO TABS
1.0000 mg | ORAL_TABLET | Freq: Every day | ORAL | Status: DC
  Administered 2019-11-01: 22:00:00 1 mg via ORAL
  Filled 2019-11-01: qty 1

## 2019-11-01 MED ORDER — STERILE WATER FOR INJECTION IJ SOLN
INTRAMUSCULAR | Status: AC
  Administered 2019-11-01: 19:00:00 1.2 mL via INTRAMUSCULAR
  Filled 2019-11-01: qty 10

## 2019-11-01 MED ORDER — LORAZEPAM 2 MG/ML IJ SOLN
2.0000 mg | Freq: Once | INTRAMUSCULAR | Status: AC
  Administered 2019-11-01: 2 mg via INTRAMUSCULAR
  Filled 2019-11-01: qty 1

## 2019-11-01 MED ORDER — BENZTROPINE MESYLATE 1 MG PO TABS
0.5000 mg | ORAL_TABLET | Freq: Every day | ORAL | Status: DC
  Administered 2019-11-01: 22:00:00 0.5 mg via ORAL
  Filled 2019-11-01: qty 1

## 2019-11-01 MED ORDER — ZIPRASIDONE MESYLATE 20 MG IM SOLR
20.0000 mg | Freq: Once | INTRAMUSCULAR | Status: AC
  Administered 2019-11-01: 20 mg via INTRAMUSCULAR
  Filled 2019-11-01: qty 10

## 2019-11-01 MED FILL — Ziprasidone Mesylate For Inj 20 MG (Base Equivalent): INTRAMUSCULAR | Qty: 20 | Status: AC

## 2019-11-01 NOTE — [facility] Counselor (Signed)
Pt remains in the ED due to suicidal ideation and apparent manic episode.  Pt was reassessed.  Pt's speech was rapid and pressured, and thought content was tangential.  Pt began a lengthy account of his drug use, how he feels that he believes people are out to get him and ''plot his downfall.''  Pt also reported that he believes people can sense when he uses drugs.  Recommend continued inpatient placement.

## 2019-11-01 NOTE — ED Notes (Signed)
Pt opened the door and asked if he could have some help. Asked pt what he needed. Pt explained that he wanted to have a BM and that he wanted sprite. Told pt to wear a mask if he wanted to use th actual bathroom. Bleached everything after he had used it. Gave pt 2 sprites and RN Kriste Basque gave him 3 band-aids.

## 2019-11-01 NOTE — ED Notes (Signed)
Pt requested "I wanted it to be documented that I requested legal representation". States he wants to speak w/a "judge who is over the dr". Pt noted to be calm, cooperative. States he feels safe to go home - states he can quarantine at home. Advised pt he may contact any attorney he wishes. Pt stated he did not have an attorney and requested phone numbers. Dr Rodena Medin aware. Joe, SW, brought pt listing of attorneys. Pt voiced appreciation.

## 2019-11-01 NOTE — ED Notes (Signed)
Pt had yelled "I am bored!". Went into the room to check on pt. Pt stated that they were very bored. Told pt we have crayons and pt said that that would be fine and that they wanted apple juice. Gave pt 3 apple juices and 4 different color crayons.

## 2019-11-01 NOTE — ED Notes (Signed)
Pt asked to talk to the doctor to know: 1.To know how his COVID test came out positive. 2.To know how long the vaccine will take in effect. Did tell pt that RN will talk further to him about it. RN has been informed about this.

## 2019-11-01 NOTE — Progress Notes (Signed)
CSW provided RN a list of board certified attorneys that practice in [facility] per patient request.

## 2019-11-01 NOTE — ED Notes (Signed)
Pt noted to be irritable - order for Geodon and Ativan received. Pt allowed for EKG to be performed. Pt given sandwich and Sprite as requested. Pt asked for name of AC who did not come to speak w/him as advised - Lydia. Asher Muir, Consulting civil engineer, aware pt asking for advocate. Pt tolerated injections well.

## 2019-11-01 NOTE — ED Notes (Signed)
Pt talking on portable phone in room d/t COVID-19 Positive - states he calling attorneys.

## 2019-11-01 NOTE — ED Notes (Signed)
Brought in pt.s breakfast and had pt sit up and begin eating their breakfast. Told pt that if they need anything, to please ask [place] and that we will be checking up on him when we can.

## 2019-11-01 NOTE — ED Notes (Addendum)
Phone removed from pt's room. Pt noted to be irritable - states "I know I am a pain in the ass". Pt advised he wants to call more attorneys - states he only left message for 1 on the list given. Advised pt he may call them tomorrow as they are not open on Sundays. Pt voiced understanding. Pt asking repeatedly to speak w/pt advocate. Serita Sheller, aware. Pt aware she will come speak w/him. Sharyl Nimrod, [place] SW, to ask [facility] NP for meds. Pt aware and voiced appreciation.

## 2019-11-01 NOTE — ED Notes (Signed)
Pt noted to be ambulating back and forth to bathroom. Encouraged pt to use urinal as he is COVID-19 Positive. Pt states he needed to have BM - this is the 2nd one today. Denies any abd pain/discomfort/diarrhea. Pt noted to become irritable - stating "I need to get out of this room". Also states "I need to see the Administrator pronto" as he snapped his fingers.

## 2019-11-01 NOTE — ED Notes (Signed)
Pt asked to talk on the phone, due to isolation protocols, this Clinical research associate asked RN if they could use the phone. RN Kriste Basque stated that it was okay as long as he kept his mask on and followed the rules-maintain his mask and attorneys are not open on Sunday. Pt verbally stated that he agreed and understood.

## 2019-11-01 NOTE — ED Notes (Signed)
Pt aware, per Dr Rodena Medin, pt's COVID test was PCR test so no re-test is warranted. Questions answered to satisfaction. Pt aware and voiced understanding of tx plan - Inpt.

## 2019-11-01 NOTE — ED Notes (Signed)
IVC/Inpt Breakfast ordered  

## 2019-11-01 NOTE — ED Notes (Addendum)
Re-TTS completed. Pt asking for COVID test to be re-done d/t positive. Advised pt will ask EDP. Pt given Sprite as requested.

## 2019-11-02 DIAGNOSIS — F1594 Other stimulant use, unspecified with stimulant-induced mood disorder: Secondary | ICD-10-CM | POA: Diagnosis present

## 2019-11-02 MED ORDER — OLANZAPINE 5 MG PO TABS
10.0000 mg | ORAL_TABLET | Freq: Two times a day (BID) | ORAL | Status: DC
  Administered 2019-11-02: 10 mg via ORAL
  Filled 2019-11-02: qty 2

## 2019-11-02 MED ORDER — GABAPENTIN 300 MG PO CAPS: 300.0000 mg | ORAL_CAPSULE | Freq: Three times a day (TID) | ORAL | Status: DC

## 2019-11-02 NOTE — ED Notes (Signed)
Pt. Opened the door, knowing he is COVID precaution, and asked if he could use the bathroom to have a BM. Told pt to maintain his mask and limit the things he touches. Pt came out the bathroom and ask for "an administrator or someone higher upper". Told pt that we will try to do that for him, but there is no guarantee. Pt then said "Well, I want the phone to call my attorney". Told pt that I will talk to his RN to see if he can use the phone later.

## 2019-11-02 NOTE — ED Notes (Signed)
Phone given- pt eating breakfast. Hits call bell repeatedly when demands are not answered promptly.

## 2019-11-02 NOTE — Discharge Instructions (Signed)
fo  Methamphetamines Use Disorder Methamphetamines use disorder is a condition in which the use of methamphetamines disrupts daily life. Methamphetamines are one type of drug in a group of powerful drugs known as stimulants. Common names for methamphetamines are meth, speed, crystal, ice, glass, and chalk. These drugs have some medical uses but are often misused because of their effects, which include:  A feeling of extreme pleasure (euphoria).  Alertness.  A high energy level.  Increased sexuality. Misuse of methamphetamines can lead to a substance use disorder and can cause problems with physical and mental health, including:  Poor nutrition (malnutrition) and extreme weight loss.  Jaw clenching and severe dental problems.  Lung problems.  Skin sores.  Diseases caused by infections, such as hepatitis or HIV.  Anxiety.  Memory and sleep problems.  Seeing or hearing things that are not really there (having hallucinations).  Violent behavior. Methamphetamines use disorder can be dangerous. Methamphetamine use increases your blood pressure and heart rate, which can lead to a heart attack or stroke. These drugs can also make your heart rate irregular and cause seizures. The problems that these drugs cause can be life-threatening. What are the causes? This condition is caused by misusing methamphetamines for a period of time, such as by taking them for reasons other than to treat a diagnosed problem. Many people start using these drugs because the drugs make them feel good. Over time, they get addicted to them.  Methamphetamines work fast, and the good feelings that they produce go away quickly. As a result, people often binge by taking many doses over short periods of time. What increases the risk? This condition is more likely to develop in people who:  Misuse other drugs.  Have problems with mood or behavior.  Have a mental illness, such as depression, post-traumatic stress  disorder, or antisocial personality disorder.  Begin use at an early age, such as during their teenage years. What are the signs or symptoms? Symptoms of this condition include:  Using methamphetamines for longer periods of time or in higher amounts than you want to.  Being unable to slow down or stop your use of the drug.  Craving methamphetamines.  Spending an abnormal amount of time getting methamphetamines, using them, or recovering from their effects.  Using methamphetamines in a way that interferes with work, school, social activities, or personal relationships.  Giving up or cutting down on important life activities because of methamphetamine use.  Using these drugs when it is dangerous, such as when driving a car.  Continuing to use the drug even after it has led to problems such as: ? Physical or mental health problems. ? Legal or financial troubles. ? Job loss. ? Broken relationships.  Needing more and more of the drug to get the same effect (building up a tolerance).  Experiencing unpleasant symptoms if you do not use the methamphetamine (withdrawal). Some symptoms of withdrawal include: ? Being unable to feel pleasure (anhedonia). ? Feeling irritable. ? Low energy and restlessness. ? Bad dreams and too little or too much sleep. ? Increased appetite. How is this diagnosed? This condition is diagnosed based on:  A physical exam.  Your history of methamphetamine use.  Your symptoms. This includes: ? How methamphetamine use affects your life. ? Changes in personality, behaviors, and mood. ? Having at least two symptoms of methamphetamines use disorder within a 29-month period. How severe the condition is depends on how many symptoms you have. ? Health issues related to using methamphetamines.  Blood or urine tests to screen for drugs. How is this treated? The first goal of treatment is to stop your use of methamphetamines. This must be done safely and may  involve taking medicines to lessen withdrawal symptoms. Treatment may also involve:  Taking part in group and individual counseling from mental health providers who have experience with substance use disorder.  Staying at a residential treatment center for several days or weeks.  Attending daily counseling sessions at a treatment center.  Taking medicines as told by your health care provider that: ? Ease symptoms and prevent complications during withdrawal. ? Block cravings and block the good feeling that you get from using methamphetamines. ? Treat other mental health issues, such as depression or anxiety. ? Reduce agitation.  Participating in a support group to share your experience with others who are going through the same thing. Recovery can be a long process. Many people who undergo treatment start using the drug again after stopping (relapse). If you relapse, it does not mean that treatment will not work. Follow these instructions at home: Medicines  Take over-the-counter and prescription medicines only as told by your health care provider.  Check with your health care provider before starting any new medicines, herbs, or supplements. Dental care Take care of your teeth by:  Brushing and flossing. Do this two times a day.  Using an antibacterial mouthwash.  Avoiding sugary drinks. General instructions  Do not use any drugs or alcohol.  Avoid people and activities that trigger your use of methamphetamines.  Learn and practice techniques for managing stress.  Have a plan for vulnerable moments. Get phone numbers of those who are willing to help and who are committed to your recovery.  Attend support groups regularly. These groups provide emotional support, advice, and guidance.  Do not use any products that contain nicotine or tobacco, such as cigarettes, e-cigarettes, and chewing tobacco. If you need help quitting, ask your health care provider.  Keep all follow-up  visits as told by your health care provider. This is important. This includes continuing to work with therapists and support groups. Where to find more information  [employer] on Drug Abuse: http://vang.com/  Substance Abuse and Mental Health Services Administration: SamedayNews.com.cy  Narcotics Anonymous: https://torres-moran.org/ Contact a health care provider if:  Your symptoms get worse.  You use methamphetamines again.  You cannot take medicines as told. Get help right away if:  You have serious thoughts about hurting yourself or others.  You may have taken too much of a methamphetamine (overdosed). Symptoms of an overdose include: ? Chest pain. ? A seizure.  You have any symptoms of a stroke. "BE FAST" is an easy way to remember the main warning signs of a stroke: ? B - Balance. Signs are dizziness, sudden trouble walking, or loss of balance. ? E - Eyes. Signs are trouble seeing or a sudden change in vision. ? F - Face. Signs are sudden weakness or numbness of the face, or the face or eyelid drooping on one side. ? A - Arms. Signs are weakness or numbness in an arm. This happens suddenly and usually on one side of the body. ? S - Speech. Signs are sudden trouble speaking, slurred speech, or trouble understanding what people say. ? T - Time. Time to call emergency services. Write down what time symptoms started. These symptoms may represent a serious problem that is an emergency. Do not wait to see if the symptoms will go away. Get medical help  right away. Call your local emergency services (911 in the [place] Do not drive yourself to the hospital. If you ever feel like you may hurt yourself or others, or have thoughts about taking your own life, get help right away. You can go to your nearest emergency department or call:  Your local emergency services (911 in the [place]  A suicide crisis helpline, such as the National Suicide Prevention Lifeline at 802-797-3102. This is open 24 hours a  day. Summary  Methamphetamines use disorder is a condition in which the use of methamphetamines disrupts daily life.  Methamphetamines are one type of drug in a group of powerful drugs known as stimulants.  Recovery can be a long process. If you have a relapse, that does not mean that treatment will not work.  Avoid people and activities that trigger your use of methamphetamines, and be sure to attend support groups regularly. This information is not intended to replace advice given to you by your health care provider. Make sure you discuss any questions you have with your health care provider. Document Revised: 12/31/2018 Document Reviewed: 12/31/2018 Elsevier Patient Education  2020 [employer].

## 2019-11-02 NOTE — ED Notes (Addendum)
Charge nurse at bedside per pt's request. He states he lost ID. RN attempted to call [employer] to see if they have and he found it among his belongings. Psych NP, Macky Lower, discharged, rescinded IVC, pts belongings returned, discharge instructions given.

## 2019-11-02 NOTE — Consult Note (Signed)
[facility] Psych ED Discharge  11/02/2019 10:05 AM Kenneth Hunt  MRN:  [id number] Principal Problem: Methamphetamine-induced mood disorder [facility]) Discharge Diagnoses: Principal Problem:   Methamphetamine-induced mood disorder (HCC)  Subjective: "I want to go home."  Total Time spent with patient: 45 minutes   Patient seen and evaluated in person by this provider for suicidal ideations related to his methamphetamine use.  His initial presentation in the ED 2 days ago was manic with thoughts to end his life.  Today, he has a list of complaints to address with his attorney and the charge nurse as he wants to leave.  He denies suicidal/homicidal ideations, hallucinations, and withdrawal symptoms.  Recommended rehab for his methamphetamine use, patient declined.  He recently in December moved to [place] from [place] where he has a long record of using methamphetamines.  States he receives psychiatric care at [facility], does not feel he needs anything at this time.  Asked the patient if there was anything we could do to help him and he responded he just wanted to speak to the charge nurse.  She did come and speak to him.  Patient reports he lives alone in his apartment and does not have anyone for collateral besides professionals.  No safety concerns about returning home and reports he will call 911 or return to the emergency room if his suicidal ideations return.  Psychiatrically stable for discharge at this time.  TTS Assessment on 3/20 with initial presentation under the influence of methamphetamines:  Kenneth Hunt is an 39 y.o. male who was brought to [place] after having [place] with suicidal ideation with a plan to jump.  Patient states that he attempted suicide in the past by overdose in 2013.  Patient is a former Charity fundraiser, who is disabled with some form of medical issue that he states keeps his hands from functioning properly.  He also has  been diagnosed with bipolar disorder in the past and has HIV.  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  He states that he has been hanging around with the wrong people and doing things more often than he should.  (evidently drugs and sex).  Patient states that his parents are deceased and states that he has no emotional support other than his three dogs.  He eludes that he has been in previous relationships with others who have let him down and he states that he really has no trust for others and he states that he does not feel like people need to be trying to make decisions for him.    Patient states that he is not really depressed, he is not stressed.  He states, "I just want to die."  He states that It is not a depressed feeling, "just a certainty." Patient states that he has been feeling this way for a couple years now, but his thoughts of killing himself have worsened over the past two months.  Patient states that the only reason that he did not jump last night was fear that he would not be successful in his suicide attempt and that he would be in physical pain and still not accomplish what he set out to do.  Patient states that he  that he wants to die and "I am positive that is what I want."  He further states, "There is nothing  here for me in this world and I am kind of over it." Patient states that "I am building up the courage to do it and I know that I eventually will do it."  He states that he wishes that there was a law where he could be euthanized and his wishes be granted rather than "professional do-gooders making decisions concerning my life."  Patient states that he received outpatient [facility] services in 2013 after he attempted suicide.  However, he states that he has no current provider and states that he is currently not on any medications for depression.  Patient denies HI/Psychosis  and states that for the most part that he has been sleeping and eating well.  Patient states that he has been using methamphetamines for the past five years.  He states that he is currently using on two to three occasions per week, but will not elaborate on how much he is using each time, but does admit that he has experienced an increase in tolerance.  Patient states that he used to use heroin in the past, but did not provide any specifics.  Patient presented as oriented and alert.  He was tangential when speaking of death and his presentation was very dramatic when discussing his views on death and eternal life.  TTS could not determine if his thoughts were spiritually driven based on his beliefs of if he was seeking either a reaction or sympathy.  His thoughts were relatively organized.  His mood was depressed and his affect mostly flat the majority of the assessment process.  His memory appeared to be intact, but he was selective in what he was willing to share.  He repeated several times, "people cannot understand what I am feeling."  Patient's eye contact was poor, but his speech coherent.  He did not appear to be responding to any internal stimuli.  His judgment and impulse control appear to be impaired, but his insight is relatively good.  Past Psychiatric History: bipolar d/o, methamphetamine abuse  Past Medical History:  Past Medical History:  Diagnosis Date  . 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 MRI brain and cervical spine showed bilateral neural foraminal cysts C4-C5, C5-C6, C6-C7, brain within normal limits.  - continue cymbalta, flexeril, neurontin - f/u with Dr. Elana Alm [facility] neurology 03/28/19  . 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 HIV (human immunodeficiency virus infection) ([place])    History reviewed. No pertinent surgical history. Family History: No family history on file. Family Psychiatric   History: denies Social History:  Social History   Substance and Sexual Activity  Alcohol Use Not Currently     Social History   Substance and Sexual Activity  Drug Use Yes  . Types: IV, Methamphetamines   Comment: crystal meth; hasn't used in 3 days    Social History   Socioeconomic History  . Marital status: Single    Spouse name: Not on file  . Number of children: Not on file  . Years of education: Not on file  . Highest education level: Not on file  Occupational History  . Not on file  Tobacco Use  . Smoking status: Never Smoker  . Smokeless tobacco: Never Used  Substance and Sexual Activity  . Alcohol use: Not Currently  . Drug use: Yes    Types: IV, Methamphetamines    Comment: crystal meth; hasn't used in 3 days  . Sexual activity: Yes  Partners: Male    Comment: declined condoms; 1/21  Other Topics Concern  . Not on file  Social History Narrative  . Not on file   Social Determinants of Health   Financial Resource Strain:   . Difficulty of Paying Living Expenses:   Food Insecurity:   . Worried About Programme researcher, broadcasting/film/video in the Last Year:   . Barista in the Last Year:   Transportation Needs:   . Freight forwarder (Medical):   Marland Kitchen Lack of Transportation (Non-Medical):   Physical Activity:   . Days of Exercise per Week:   . Minutes of Exercise per Session:   Stress:   . Feeling of Stress :   Social Connections:   . Frequency of Communication with Friends and Family:   . Frequency of Social Gatherings with Friends and Family:   . Attends Religious Services:   . Active Member of Clubs or Organizations:   . Attends Banker Meetings:   Marland Kitchen Marital Status:     Has this patient used any form of tobacco in the last 30 days? (Cigarettes, Smokeless Tobacco, Cigars, and/or Pipes) NA  Current Medications: Current Facility-Administered Medications  Medication Dose Route Frequency Provider Last Rate Last Admin  .  bictegravir-emtricitabine-tenofovir AF (BIKTARVY) 50-200-25 MG per tablet 1 tablet  1 tablet Oral Daily Lorre Nick, MD   1 tablet at 11/01/19 1023  . gabapentin (NEURONTIN) capsule 300 mg  300 mg Oral TID Charm Rings, NP      . OLANZapine [facility]) tablet 10 mg  10 mg Oral BID Charm Rings, NP   10 mg at 11/02/19 8546   Current Outpatient Medications  Medication Sig Dispense Refill  . bictegravir-emtricitabine-tenofovir AF (BIKTARVY) 50-200-25 MG TABS tablet Take 1 tablet by mouth daily. Try to take at the same time each day with or without food. (Patient not taking: Reported on 10/31/2019) 30 tablet 5   PTA Medications: (Not in a hospital admission)   Musculoskeletal: Strength & Muscle Tone: within normal limits Gait & Station: normal Patient leans: N/A  Psychiatric Specialty Exam: Physical Exam Vitals and nursing note reviewed.  Constitutional:      Appearance: Normal appearance. He is normal weight.  HENT:     Head: Normocephalic.     Nose: Nose normal.  Pulmonary:     Effort: Pulmonary effort is normal.  Musculoskeletal:        General: Normal range of motion.  Neurological:     General: No focal deficit present.     Mental Status: He is alert and oriented to person, place, and time.  Psychiatric:        Attention and Perception: Attention and perception normal.        Mood and Affect: Affect normal. Mood is anxious.        Speech: Speech normal.        Behavior: Behavior normal. Behavior is cooperative.        Thought Content: Thought content normal.        Cognition and Memory: Cognition and memory normal.        Judgment: Judgment normal.     Review of Systems  Psychiatric/Behavioral: The patient is nervous/anxious.   All other systems reviewed and are negative.   Blood pressure 96/75, pulse 85, temperature 97.9 F (36.6 C), temperature source Oral, resp. rate 16, SpO2 95 %.There is no height or weight on file to calculate BMI.  General Appearance:  Casual  Eye  Contact:  Good  Speech:  Normal Rate  Volume:  Normal  Mood:  Anxious mild  Affect:  Congruent  Thought Process:  Coherent and Descriptions of Associations: Intact  Orientation:  Full (Time, Place, and Person)  Thought Content:  WDL and Logical  Suicidal Thoughts:  No  Homicidal Thoughts:  No  Memory:  Immediate;   Good Recent;   Good Remote;   Good  Judgement:  Fair  Insight:  Fair  Psychomotor Activity:  Normal  Concentration:  Concentration: Good and Attention Span: Good  Recall:  Good  Fund of Knowledge:  Good  Language:  Good  Akathisia:  No  Handed:  Right  AIMS (if indicated):     Assets:  Housing Leisure Time Physical Health Resilience Social Support  ADL's:  Intact  Cognition:  WNL  Sleep:        Demographic Factors:  Male, Caucasian and Living alone  Loss Factors: NA  Historical Factors: NA  Risk Reduction Factors:   Sense of responsibility to family, Positive social support and Positive therapeutic relationship  Continued Clinical Symptoms:  Anxiety, mild  Cognitive Features That Contribute To Risk:  None    Suicide Risk:  Minimal: No identifiable suicidal ideation.  Patients presenting with no risk factors but with morbid ruminations; may be classified as minimal risk based on the severity of the depressive symptoms   Plan Of Care/Follow-up recommendations: Methamphetamine induced mood disorder: -Started gabapentin 300 mg TID, patient declined -recommended rehab, patient declined   Activity:  as tolerated Diet:  heart healthy diet  Disposition: discharge home Nanine Means, NP 11/02/2019, 10:05 AM

## 2019-11-09 ENCOUNTER — Other Ambulatory Visit: Payer: Self-pay | Admitting: *Deleted

## 2019-11-09 ENCOUNTER — Other Ambulatory Visit: Payer: Self-pay

## 2019-11-09 DIAGNOSIS — B20 Human immunodeficiency virus [HIV] disease: Secondary | ICD-10-CM

## 2019-11-11 ENCOUNTER — Emergency Department (HOSPITAL_COMMUNITY): Payer: Medicare Other

## 2019-11-11 ENCOUNTER — Emergency Department (HOSPITAL_COMMUNITY)
Admission: EM | Admit: 2019-11-11 | Discharge: 2019-11-11 | Disposition: A | Discharge status: Home / Self Care | Payer: Medicare Other | Attending: Emergency Medicine | Admitting: Emergency Medicine

## 2019-11-11 ENCOUNTER — Encounter (HOSPITAL_COMMUNITY): Payer: Self-pay | Admitting: Emergency Medicine

## 2019-11-11 DIAGNOSIS — Z79899 Other long term (current) drug therapy: Secondary | ICD-10-CM | POA: Insufficient documentation

## 2019-11-11 DIAGNOSIS — M778 Other enthesopathies, not elsewhere classified: Secondary | ICD-10-CM | POA: Diagnosis not present

## 2019-11-11 DIAGNOSIS — B20 Human immunodeficiency virus [HIV] disease: Secondary | ICD-10-CM | POA: Insufficient documentation

## 2019-11-11 DIAGNOSIS — M25532 Pain in left wrist: Secondary | ICD-10-CM | POA: Diagnosis present

## 2019-11-11 MED ORDER — ACETAMINOPHEN 500 MG PO TABS: 500.0000 mg | ORAL_TABLET | Freq: Four times a day (QID) | ORAL | 0 refills | Status: DC | PRN

## 2019-11-11 MED ORDER — KETOROLAC TROMETHAMINE 60 MG/2ML IM SOLN
60.0000 mg | Freq: Once | INTRAMUSCULAR | Status: AC
  Administered 2019-11-11: 60 mg via INTRAMUSCULAR
  Filled 2019-11-11: qty 2

## 2019-11-11 NOTE — ED Triage Notes (Signed)
Pt here with c/o left wrist pain , doesn't remember hurting his wrist

## 2019-11-11 NOTE — Discharge Instructions (Signed)
Please call the office of Dr. Carola Frost to schedule an appointment for further evaluation management should your symptoms fail to improve with conservative therapy.  Please follow-up with your primary care provider regarding today's encounter.  Please take your Tylenol, as prescribed.  Wear your brace and try to avoid excessive use of left wrist.  Please return to the ED or seek immediate medical attention to experience any new or worsening symptoms.

## 2019-11-11 NOTE — ED Notes (Signed)
Patient verbalizes understanding of discharge instructions. Opportunity for questioning and answers were provided. Armband removed by staff, pt discharged from ED. Patient given bus pass.  

## 2019-11-11 NOTE — ED Provider Notes (Addendum)
[facility] EMERGENCY DEPARTMENT Provider Note   CSN: [id number] Arrival date & time: 11/11/19  1219     History No chief complaint on file.   Kenneth Hunt is a 39 y.o. male with PMH significant for HIV, syphilis, methamphetamine induced mood disorder, substance use disorder, and type II bipolar disorder who presents to the ED with complaints of left wrist pain.  Patient endorses regular IV methamphetamine use, as recently as 7 days ago.  He has been doing this for years.  He once worked as an Tree surgeon.  He denies any obvious precipitating trauma or injury.  He states that his discomfort is over the distal part of his radius and he noticed mild swelling.  He was concerned for fracture or other bony abnormalities.  He suspects it may be a tendinitis as he uses that hand very frequently and is always on his phone.  He states that his discomfort is worse with certain movements and at one point it was uncomfortable with supination.  He denies any fevers or chills, recent illness, chest pain or shortness of breath, cough or pleuritic chest discomfort, diminished ROM, diminished strength, numbness or tingling, redness or other overlying skin changes, warmth, or other symptoms.  Patient is also relatively frazzled here in the ED as he believes that he left his wallet on the bus.  He is followed by an NP at the [facility].  HPI     Past Medical History:  Diagnosis Date  . Brachial plexus disorders 07/31/2018   Seen by [facility] neurology. MRI brain and cervical spine showed bilateral neural foraminal cysts C4-C5, C5-C6, C6-C7, brain within normal limits.  - continue cymbalta, flexeril, neurontin - f/u with Dr. Elana Alm [facility] neurology 03/28/19  . 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 HIV (human immunodeficiency virus infection) [facility])     Patient Active Problem List   Diagnosis Date Noted  .  Methamphetamine-induced mood disorder ([place]) 11/02/2019  . Injection of illicit drug within last 12 months 08/26/2019  . Bipolar 2 disorder ([place]) 03/04/2019  . Human immunodeficiency virus (HIV) disease ([place]) 10/01/2018  . Thoracic outlet syndrome 10/01/2018  . 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 Methamphetamine dependence ([place]) 12/30/2017  . Moderate recurrent major depression ([place]) 12/30/2017  . History of syphilis 12/30/2017    History reviewed. No pertinent surgical history.     History reviewed. No pertinent family history.  Social History   Tobacco Use  . Smoking status: Never Smoker  . Smokeless tobacco: Never Used  Substance Use Topics  . Alcohol use: Not Currently  . Drug use: Yes    Types: IV, Methamphetamines    Comment: crystal meth; hasn't used in 3 days    Home Medications Prior to Admission medications   Medication Sig Start Date End Date Taking? Authorizing Provider  bictegravir-emtricitabine-tenofovir AF (BIKTARVY) 50-200-25 MG TABS tablet Take 1 tablet by mouth daily. Try to take at the same time each day with or without food. 07/23/19  Yes Goodwell Callas, NP  DULoxetine HCl (CYMBALTA PO) Take 90 mg by mouth daily.   Yes [provider]  gabapentin (NEURONTIN) 300 MG capsule Take 300 mg by mouth 3 (three) times daily.   Yes [provider]  acetaminophen (TYLENOL) 500 MG tablet Take 1 tablet (500 mg total) by mouth every 6 (six) hours as needed. 11/11/19   Corena Herter, PA-C    Allergies  Haloperidol  Review of Systems   Review of Systems  Constitutional: Negative for chills and fever.  Musculoskeletal: Positive for arthralgias. Negative for joint swelling.  Skin: Negative for color change and wound.  Neurological: Negative for weakness and numbness.     Physical Exam Updated Vital Signs BP 108/71   Pulse 100   Temp (!) 97.5 F (36.4 C)   Resp 18   SpO2 100%   Physical Exam Vitals and nursing note reviewed.  Exam conducted with a chaperone present.  Constitutional:      General: He is not in acute distress.    Appearance: Normal appearance. He is not ill-appearing.  HENT:     Head: Normocephalic and atraumatic.  Eyes:     General: No scleral icterus.    Conjunctiva/sclera: Conjunctivae normal.  Cardiovascular:     Rate and Rhythm: Normal rate and regular rhythm.     Pulses: Normal pulses.     Heart sounds: Normal heart sounds.  Pulmonary:     Effort: Pulmonary effort is normal. No respiratory distress.     Breath sounds: Normal breath sounds.  Musculoskeletal:     Cervical back: Normal range of motion.     Comments: Tenderness noted over distal radius.  Mild swelling over dorsum of wrist.  No erythema or warmth.  Positive Phalen's test.  Negative Tinel's test.  ROM and strength intact.  Grip strength intact.  Radial pulse and cap refill intact.  Sensation intact throughout.  Discomfort with flexion extension of wrist.  No metacarpal TTP.  No anatomic snuffbox TTP.  Soft compartments.  Skin:    General: Skin is dry.     Capillary Refill: Capillary refill takes less than 2 seconds.  Neurological:     Mental Status: He is alert and oriented to person, place, and time.     GCS: GCS eye subscore is 4. GCS verbal subscore is 5. GCS motor subscore is 6.  Psychiatric:        Mood and Affect: Mood normal.        Behavior: Behavior normal.        Thought Content: Thought content normal.     ED Results / Procedures / Treatments   Labs (all labs ordered are listed, but only abnormal results are displayed) Labs Reviewed - No data to display  EKG None  Radiology DG Wrist Complete Left  Result Date: 11/11/2019 CLINICAL DATA:  Left wrist pain and swelling EXAM: LEFT WRIST - COMPLETE 3+ VIEW COMPARISON:  None. FINDINGS: There is no evidence of fracture or dislocation. There is no evidence of arthropathy or other focal bone abnormality. Soft tissue swelling most pronounced at the dorsum of the  wrist. IMPRESSION: Soft tissue swelling without evidence of fracture or dislocation. Electronically Signed   By: Duanne Guess D.O.   On: 11/11/2019 13:11    Procedures Procedures (including critical care time)  Medications Ordered in ED Medications  ketorolac (TORADOL) injection 60 mg (60 mg Intramuscular Given 11/11/19 1407)    ED Course  I have reviewed the triage vital signs and the nursing notes.  Pertinent labs & imaging results that were available during my care of the patient were reviewed by me and considered in my medical decision making (see chart for details).    MDM Rules/Calculators/A&P                      I reviewed plain films obtained of his left wrist which demonstrates some soft tissue  swelling, but without any evidence of fracture or dislocation.  No joint effusion or other arthropathy.  No overlying erythema or warmth concerning for septic arthritis.  Patient is able to demonstrate full ROM.  No effusion on x-ray.  Radial pulse and cap refill intact.  Sensation intact throughout.  Patient did have positive Phalen's test, but history is less concerning for median nerve compression.  Negative Tinel's sign.  Tenderness is over distal aspect of radius.  Low suspicion for tenosynovitis given lack of systemic illness, warmth, redness, or other overlying skin changes.  Patient's history and physical exam is consistent with a tendinitis.  Given that he is on Biktarvy, will avoid ibuprofen and instead prescribe Tylenol.  Will place in a wrist brace for comfort.  Patient will need to follow-up with his primary care provider for ongoing evaluation management.  We will also refer patient to orthopedics for ongoing evaluation and management should his symptoms fail to improve with conservative therapy.  Strict ED return precautions discussed.  All of the evaluation and work-up results were discussed with the patient and any family at bedside. They were provided opportunity to ask  any additional questions and have none at this time. They have expressed understanding of verbal discharge instructions as well as return precautions and are agreeable to the plan.    Final Clinical Impression(s) / ED Diagnoses Final diagnoses:  Tendonitis of wrist, left    Rx / DC Orders ED Discharge Orders         Ordered    acetaminophen (TYLENOL) 500 MG tablet  Every 6 hours PRN     11/11/19 1433           Lorelee New, PA-C 11/11/19 1433    Lorelee New, PA-C 11/11/19 1436    Virgina Norfolk, DO 11/11/19 1621

## 2019-11-24 ENCOUNTER — Encounter: Payer: Self-pay | Admitting: Infectious Diseases

## 2019-11-27 ENCOUNTER — Ambulatory Visit: Payer: Medicare Other | Admitting: Infectious Diseases

## 2019-11-28 ENCOUNTER — Encounter (HOSPITAL_COMMUNITY): Payer: Self-pay | Admitting: Emergency Medicine

## 2019-11-28 ENCOUNTER — Emergency Department (HOSPITAL_COMMUNITY)
Admission: EM | Admit: 2019-11-28 | Discharge: 2019-11-29 | Payer: Medicare Other | Attending: Emergency Medicine | Admitting: Emergency Medicine

## 2019-11-28 ENCOUNTER — Other Ambulatory Visit: Payer: Self-pay

## 2019-11-28 DIAGNOSIS — R000 Tachycardia, unspecified: Secondary | ICD-10-CM | POA: Diagnosis not present

## 2019-11-28 DIAGNOSIS — F1910 Other psychoactive substance abuse, uncomplicated: Secondary | ICD-10-CM | POA: Diagnosis not present

## 2019-11-28 DIAGNOSIS — Z008 Encounter for other general examination: Secondary | ICD-10-CM | POA: Diagnosis present

## 2019-11-28 NOTE — ED Triage Notes (Signed)
Per [employer], pt was arrested for B&E, states he was using "meth and I need to be checked out."  Pt states he was sweaty and "does not feel right."  Denies S/I and HI

## 2019-11-29 NOTE — ED Provider Notes (Signed)
[place] EMERGENCY DEPARTMENT Provider Note   CSN: [id number] Arrival date & time: 11/28/19  2259     History Chief Complaint  Patient presents with  . drug use    Kenneth Hunt is a 39 y.o. male.  HPI     This a 39 year old male with a history of syphilis and HIV who presents with polysubstance abuse.  Patient was being arrested for breaking and entering.  He reports prior to this he injected methamphetamine and GHB.  He does this regularly for the high and "good sex."  He states that he was sweaty sitting in his car and this concerned him.  Currently he denies any chest pain, shortness of breath, palpitations, abdominal pain, nausea, vomiting.  At baseline he states that sometimes he has some issues urinating but this is not new.  Police brought him here for medical clearance.  Past Medical History:  Diagnosis Date  . 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 MRI brain and cervical spine showed bilateral neural foraminal cysts C4-C5, C5-C6, C6-C7, brain within normal limits.  - continue cymbalta, flexeril, neurontin - f/u with Dr. Elana Alm [facility] neurology 03/28/19  . 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 HIV (human immunodeficiency virus infection) [facility])     Patient Active Problem List   Diagnosis Date Noted  . Methamphetamine-induced mood disorder ([place]) 11/02/2019  . Injection of illicit drug within last 12 months 08/26/2019  . Bipolar 2 disorder ([place]) 03/04/2019  . Human immunodeficiency virus (HIV) disease ([place]) 10/01/2018  . Thoracic outlet syndrome 10/01/2018  . 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 Methamphetamine dependence ([place]) 12/30/2017  . Moderate recurrent major depression ([place]) 12/30/2017  . History of syphilis 12/30/2017    History reviewed. No pertinent surgical history.     No family history on file.  Social History   Tobacco Use  . Smoking  status: Never Smoker  . Smokeless tobacco: Never Used  Substance Use Topics  . Alcohol use: Not Currently  . Drug use: Yes    Types: IV, Methamphetamines    Comment: crystal meth; hasn't used in 3 days    Home Medications Prior to Admission medications   Medication Sig Start Date End Date Taking? Authorizing Provider  acetaminophen (TYLENOL) 500 MG tablet Take 1 tablet (500 mg total) by mouth every 6 (six) hours as needed. 11/11/19   Corena Herter, PA-C  bictegravir-emtricitabine-tenofovir AF (BIKTARVY) 50-200-25 MG TABS tablet Take 1 tablet by mouth daily. Try to take at the same time each day with or without food. 07/23/19    Callas, NP  DULoxetine (CYMBALTA) 30 MG capsule Take 90 mg by mouth daily.     [provider]  gabapentin (NEURONTIN) 300 MG capsule Take 300 mg by mouth 3 (three) times daily.    [provider]    Allergies    Haloperidol  Review of Systems   Review of Systems  Constitutional: Positive for diaphoresis. Negative for fever.  Respiratory: Negative for shortness of breath.   Cardiovascular: Negative for chest pain and leg swelling.  Gastrointestinal: Negative for nausea and vomiting.  Genitourinary: Positive for difficulty urinating.  Neurological: Negative for headaches.  All other systems reviewed and are negative.   Physical Exam Updated Vital Signs BP (!) 144/97 (BP Location: Left Arm)   Pulse (!) 124   Temp 98.7 F (37.1 C) (Oral)   Resp 18   SpO2 100%  Physical Exam Vitals and nursing note reviewed.  Constitutional:      Appearance: Normal appearance. He is well-developed.  HENT:     Head: Normocephalic and atraumatic.     Mouth/Throat:     Mouth: Mucous membranes are moist.  Eyes:     Pupils: Pupils are equal, round, and reactive to light.     Comments: Conjunctive a injected  Cardiovascular:     Rate and Rhythm: Regular rhythm. Tachycardia present.     Heart sounds: Normal heart sounds. No murmur.    Pulmonary:     Effort: Pulmonary effort is normal. No respiratory distress.     Breath sounds: Normal breath sounds. No wheezing.  Abdominal:     General: Bowel sounds are normal.     Palpations: Abdomen is soft.     Tenderness: There is no abdominal tenderness. There is no rebound.  Musculoskeletal:     Cervical back: Neck supple.  Lymphadenopathy:     Cervical: No cervical adenopathy.  Skin:    General: Skin is warm and dry.     Comments: Track marks left upper extremity  Neurological:     Mental Status: He is alert and oriented to person, place, and time.  Psychiatric:        Mood and Affect: Mood normal.     ED Results / Procedures / Treatments   Labs (all labs ordered are listed, but only abnormal results are displayed) Labs Reviewed - No data to display  EKG None  Radiology No results found.  Procedures Procedures (including critical care time)  Medications Ordered in ED Medications - No data to display  ED Course  I have reviewed the triage vital signs and the nursing notes.  Pertinent labs & imaging results that were available during my care of the patient were reviewed by me and considered in my medical decision making (see chart for details).    MDM Rules/Calculators/A&P                       Patient presents after being arrested for breaking and entering.  History of polysubstance abuse with the same tonight.  Going to jail.  Reported that he has felt sweaty.  He is currently asymptomatic.  He is tachycardic in the 120s.  Suspect this is likely related to acute ingestion of methamphetamines.  Given that he is otherwise without symptoms and nontoxic, do not feel he needs acute work-up at this time.  Will discharge into police custody.  After history, exam, and medical workup I feel the patient has been appropriately medically screened and is safe for discharge home. Pertinent diagnoses were discussed with the patient. Patient was given return  precautions.  Final Clinical Impression(s) / ED Diagnoses Final diagnoses:  Polysubstance abuse (HCC)  Tachycardia    Rx / DC Orders ED Discharge Orders    None       Markale Birdsell, Mayer Masker, MD 11/29/19 [phone]

## 2019-11-29 NOTE — Discharge Instructions (Signed)
You should stop doing drugs.

## 2019-12-17 ENCOUNTER — Encounter: Payer: Self-pay | Admitting: Infectious Diseases

## 2019-12-17 ENCOUNTER — Inpatient Hospital Stay (HOSPITAL_COMMUNITY): Admit: 2019-12-17 | Payer: Medicare Other

## 2019-12-17 ENCOUNTER — Ambulatory Visit (INDEPENDENT_AMBULATORY_CARE_PROVIDER_SITE_OTHER): Payer: Medicare Other | Admitting: Infectious Diseases

## 2019-12-17 ENCOUNTER — Other Ambulatory Visit: Payer: Self-pay

## 2019-12-17 VITALS — Wt 158.0 lb

## 2019-12-17 DIAGNOSIS — G540 Brachial plexus disorders: Secondary | ICD-10-CM

## 2019-12-17 DIAGNOSIS — Z8619 Personal history of other infectious and parasitic diseases: Secondary | ICD-10-CM | POA: Diagnosis not present

## 2019-12-17 DIAGNOSIS — Z113 Encounter for screening for infections with a predominantly sexual mode of transmission: Secondary | ICD-10-CM

## 2019-12-17 DIAGNOSIS — F1990 Other psychoactive substance use, unspecified, uncomplicated: Secondary | ICD-10-CM

## 2019-12-17 DIAGNOSIS — B20 Human immunodeficiency virus [HIV] disease: Secondary | ICD-10-CM | POA: Diagnosis not present

## 2019-12-17 DIAGNOSIS — F1520 Other stimulant dependence, uncomplicated: Secondary | ICD-10-CM | POA: Diagnosis not present

## 2019-12-17 MED ORDER — GABAPENTIN 100 MG PO CAPS: 100.0000 mg | ORAL_CAPSULE | Freq: Three times a day (TID) | ORAL | 2 refills | Status: DC

## 2019-12-17 MED ORDER — DULOXETINE HCL 60 MG PO CPEP: 60.0000 mg | ORAL_CAPSULE | Freq: Every day | ORAL | 2 refills | Status: DC

## 2019-12-17 NOTE — Assessment & Plan Note (Addendum)
>>  ASSESSMENT AND PLAN FOR METHAMPHETAMINE USE DISORDER, MODERATE, IN EARLY REMISSION (HCC) WRITTEN ON 12/17/2019  1:27 PM BY [name], NP  Has been using regularly. He declined resources regarding inpatient treatment plans at this time. He is enrolling with [name] for mental health assessment/care w/ h/o bipolar d/o.  [facility] for intensive outpatient substance use program. He will call to arrange.    >>ASSESSMENT AND PLAN FOR INJECTION OF ILLICIT DRUG WITHIN LAST 12 MONTHS WRITTEN ON 12/17/2019  1:27 PM BY [name], NP  Hep C RNA screening annually. Substance use counseling as above.

## 2019-12-17 NOTE — Assessment & Plan Note (Signed)
Hep C RNA screening annually. Substance use counseling as above.

## 2019-12-17 NOTE — Patient Instructions (Addendum)
Nice to see you today - please stop by the lab on your way out to update your blood work.   [facility] offers an intensive [facility] rehab program -  Walk In clinic 8:30 - 2 pm M-Friday Phone#: [phone], option 5  [facility] may be able to help as well - please talk with them about the vivitrol / substance use programs when you meet with them for a new patient appointment.   [facility] is where I want you to look for primary care.    Please sign up with MyChart to access your labs and set up email communication with our clinic for non-urgent medical concerns.    Please consider getting the COVID vaccine for you protection.

## 2019-12-17 NOTE — Assessment & Plan Note (Addendum)
Update pertinent labs today. He will continue biktarvy once a day. Hopeful treating his addiction disorder he will improve adherence.  VL today. STI screening with oral/rectal swabs today and RPR. No findings concerning for re-infection at this time.  Condoms provided, safe sex discussed.  Recommended COVID19 vaccine series - he will consider.  Return in about 3 months (around 03/18/2020) for follow up, labs.

## 2019-12-17 NOTE — Progress Notes (Signed)
Name: Kenneth Hunt  DOB: 23-Aug-1980 MRN: [id number] PCP: [place] Callas, NP    Patient Active Problem List   Diagnosis Date Noted  . Human immunodeficiency virus (HIV) disease ([place]) 10/01/2018    Priority: High  . Injection of illicit drug within last 12 months 08/26/2019    Priority: Medium  . Methamphetamine dependence ([place]) 12/30/2017    Priority: Medium  . 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 Bipolar 2 disorder ([place]) 03/04/2019  . Thoracic outlet syndrome 10/01/2018  . 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 Moderate recurrent major depression ([place]) 12/30/2017  . History of syphilis 12/30/2017     Brief Narrative:  Kenneth Hunt is a 39 y.o. male with well controlled HIV. Dx .  Transferred care from [place] in [place]  [phone])  VL < 20 recently with CD4 ~340 on Biktarvy  HIV Risk: MSM History of OIs: none known  Intake Labs 07/23/2019: Hep B sAg (-), sAb (-), cAb (-); Hep A (immune), Hep C (-) Quantiferon (-) HLA B*5701 (-) G6PD: () Toxo IgG: (-)   Previous Regimens: . Biktarvy   Genotypes: . None on record   Subjective:  CC:  Routine HIV care.  Substance use disorder Chronic thoracic outlet syndrome - needs refills on gabapentin and cymbalta.     HPI: Since our last visit together Kenneth Hunt states he has had a few ER visits related to dehydration from substance use combination - he was combining crystal meth and alcohol heavily that lead to palpitations, hypertension and significant dehydration episodes. He does inject drugs once a week. He was previously on Vivitrol and is interested in possibly resuming this. Asking about resources for rehab and recovery. Has done rehab 3 times in the past but never felt he was ready. He feels he is getting closer to that point now.   He estimates he misses about 1 dose of Biktarvy a week - this is usually the day after coming down from binge. Otherwise he  has had no side effects to his knowledge. He is sexually active with multiple male partners, all sights exposed. Describes a heaviness with urination. Had a urine test done recently through HD but awaiting results. No rashes, testicular pain, adenopathy or fevers.    Review of Systems  Constitutional: Negative for appetite change, chills, fatigue, fever and unexpected weight change.  Eyes: Negative for visual disturbance.  Respiratory: Negative for cough and shortness of breath.   Cardiovascular: Negative for chest pain and leg swelling.  Gastrointestinal: Negative for abdominal pain, diarrhea and nausea.  Genitourinary: Negative for discharge, dysuria and genital sores.  Musculoskeletal: Negative for joint swelling.  Skin: Negative for color change and rash.  Neurological: Negative for dizziness and headaches.  Hematological: Negative for adenopathy.  Psychiatric/Behavioral: Positive for dysphoric mood and sleep disturbance. The patient is not nervous/anxious.        Substance use     Past Medical History:  Diagnosis Date  . Brachial plexus disorders 07/31/2018   Seen by [facility] neurology. MRI brain and cervical spine showed bilateral neural foraminal cysts C4-C5, C5-C6, C6-C7, brain within normal limits.  - continue cymbalta, flexeril, neurontin - f/u with Dr. Elana Alm [place] [facility] neurology 03/28/19  . 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 HIV (human immunodeficiency virus infection) ([place])     Outpatient Medications Prior to Visit  Medication Sig Dispense Refill  . acetaminophen (TYLENOL) 500 MG tablet Take 1 tablet (500 mg total)  by mouth every 6 (six) hours as needed. 30 tablet 0  . bictegravir-emtricitabine-tenofovir AF (BIKTARVY) 50-200-25 MG TABS tablet Take 1 tablet by mouth daily. Try to take at the same time each day with or without food. 30 tablet 5  . DULoxetine (CYMBALTA) 30 MG capsule Take 90 mg by mouth daily.     Marland Kitchen  gabapentin (NEURONTIN) 300 MG capsule Take 300 mg by mouth 3 (three) times daily.     No facility-administered medications prior to visit.     Allergies  Allergen Reactions  . Haloperidol Other (See Comments)    Tardive dyskinesia    Social History   Tobacco Use  . Smoking status: Never Smoker  . Smokeless tobacco: Never Used  Substance Use Topics  . Alcohol use: Not Currently  . Drug use: Yes    Types: IV, Methamphetamines    Comment: crystal meth; hasn't used in 3 days    No family history on file.  Social History   Substance and Sexual Activity  Sexual Activity Yes  . Partners: Male   Comment: declined condoms; 1/21     Objective:   Vitals:   12/17/19 0859  Weight: 158 lb (71.7 kg)   Body mass index is 27.99 kg/m.  Physical Exam HENT:     Mouth/Throat:     Mouth: No oral lesions.     Dentition: Normal dentition. No dental caries.  Eyes:     General: No scleral icterus. Cardiovascular:     Rate and Rhythm: Normal rate and regular rhythm.     Heart sounds: Normal heart sounds.  Pulmonary:     Effort: Pulmonary effort is normal.     Breath sounds: Normal breath sounds.  Abdominal:     General: There is no distension.     Palpations: Abdomen is soft.     Tenderness: There is no abdominal tenderness.  Lymphadenopathy:     Cervical: No cervical adenopathy.  Skin:    General: Skin is warm and dry.     Findings: No rash.  Neurological:     Mental Status: He is alert and oriented to person, place, and time.     Lab Results Lab Results  Component Value Date   WBC 11.1 (H) 10/31/2019   HGB 16.0 10/31/2019   HCT 47.1 10/31/2019   MCV 90.1 10/31/2019   PLT 346 10/31/2019    Lab Results  Component Value Date   CREATININE 1.04 10/31/2019   BUN 13 10/31/2019   NA 137 10/31/2019   K 4.9 10/31/2019   CL 100 10/31/2019   CO2 24 10/31/2019    Lab Results  Component Value Date   ALT 15 10/31/2019   AST 31 10/31/2019   ALKPHOS 79 10/31/2019    BILITOT 1.1 10/31/2019    Lab Results  Component Value Date   CHOL 155 07/23/2019   HDL 55 07/23/2019   LDLCALC 85 07/23/2019   TRIG 60 07/23/2019   CHOLHDL 2.8 07/23/2019   HIV 1 RNA Quant (copies/mL)  Date Value  07/23/2019 42 (H)   CD4 T Cell Abs (/uL)  Date Value  08/24/2019 723  07/23/2019 86 (L)     Assessment & Plan:   Problem List Items Addressed This Visit      High   Human immunodeficiency virus (HIV) disease ([place]) (Chronic)    Update pertinent labs today. He will continue biktarvy once a day. Hopeful treating his addiction disorder he will improve adherence.  VL today. STI screening with oral/rectal  swabs today and RPR. No findings concerning for re-infection at this time.  Condoms provided, safe sex discussed.  Recommended COVID19 vaccine series - he will consider.  Return in about 3 months (around 03/18/2020) for follow up, labs.       Relevant Orders   HIV-1 RNA quant-no reflex-bld   T-helper cell (CD4)- ([facility] clinic only)   RPR   HIV-1 RNA quant-no reflex-bld     Medium   Methamphetamine dependence ([place])    Has been using regularly. He declined resources regarding inpatient treatment plans at this time. He is enrolling with [facility] for mental health assessment/care w/ h/o bipolar d/o.  [facility] walk in assessment for intensive outpatient substance use program. He will call to arrange.       Injection of illicit drug within last 12 months    Hep C RNA screening annually. Substance use counseling as above.         Unprioritized   Brachial plexus disorders    Will refill to bridge to PCP appointment cymbalta 60 mg QHS and Gabapentin 100 mg TID.  Information provided for PCP.       Relevant Medications   gabapentin (NEURONTIN) 100 MG capsule   DULoxetine (CYMBALTA) 60 MG capsule   History of syphilis    Follow RPR today .        Other Visit Diagnoses    Routine screening for STI (sexually transmitted infection)    -  Primary     Relevant Orders   Cytology (oral, anal, urethral) ancillary only   Cytology (oral, anal, urethral) ancillary only      Janene Madeira, MSN, NP-C [place] [place] Pager: [phone] Office: [phone]  12/17/19  1:28 PM

## 2019-12-17 NOTE — Assessment & Plan Note (Signed)
Follow RPR today .

## 2019-12-17 NOTE — Assessment & Plan Note (Signed)
Will refill to bridge to PCP appointment cymbalta 60 mg QHS and Gabapentin 100 mg TID.  Information provided for PCP.

## 2019-12-18 LAB — CYTOLOGY, (ORAL, ANAL, URETHRAL) ANCILLARY ONLY
Chlamydia: NEGATIVE
Chlamydia: POSITIVE — AB
Comment: NEGATIVE
Comment: NEGATIVE
Comment: NORMAL
Comment: NORMAL
Neisseria Gonorrhea: NEGATIVE
Neisseria Gonorrhea: NEGATIVE

## 2019-12-18 LAB — T-HELPER CELL (CD4) - (RCID CLINIC ONLY)
CD4 % Helper T Cell: 30 % — ABNORMAL LOW (ref 33–65)
CD4 T Cell Abs: 710 /uL (ref 400–1790)

## 2019-12-19 LAB — RPR: RPR Ser Ql: NONREACTIVE

## 2019-12-19 LAB — HIV-1 RNA QUANT-NO REFLEX-BLD
HIV 1 RNA Quant: <20 copies/mL
HIV-1 RNA Quant, Log: <1.3 Log copies/mL

## 2019-12-21 ENCOUNTER — Encounter (HOSPITAL_COMMUNITY): Payer: Self-pay

## 2019-12-21 ENCOUNTER — Other Ambulatory Visit: Payer: Self-pay

## 2019-12-21 ENCOUNTER — Emergency Department (HOSPITAL_COMMUNITY)
Admission: EM | Admit: 2019-12-21 | Discharge: 2019-12-21 | Disposition: A | Discharge status: Home / Self Care | Payer: Medicare Other | Attending: Emergency Medicine | Admitting: Emergency Medicine

## 2019-12-21 DIAGNOSIS — B20 Human immunodeficiency virus [HIV] disease: Secondary | ICD-10-CM | POA: Insufficient documentation

## 2019-12-21 DIAGNOSIS — Z79899 Other long term (current) drug therapy: Secondary | ICD-10-CM | POA: Diagnosis not present

## 2019-12-21 DIAGNOSIS — F1910 Other psychoactive substance abuse, uncomplicated: Secondary | ICD-10-CM | POA: Insufficient documentation

## 2019-12-21 DIAGNOSIS — F329 Major depressive disorder, single episode, unspecified: Secondary | ICD-10-CM | POA: Diagnosis not present

## 2019-12-21 HISTORY — DX: Brachial plexus disorders: G54.0

## 2019-12-21 HISTORY — DX: Depression, unspecified: F32.A

## 2019-12-21 NOTE — Progress Notes (Signed)
Please call Kenneth Hunt to let him know his rectal swab returned positive for Chlamydia - I would like to send in 7 days of Doxycycline to treat this for him. Rx will be at the same pharmacy where he gets his Biktarvy.  His viral load is undetectable and CD4 710 - all good signs his medication is working perfectly.

## 2019-12-21 NOTE — Progress Notes (Signed)
LVM--to call the office back regarding lab results. 

## 2019-12-21 NOTE — ED Provider Notes (Signed)
[facility]-EMERGENCY DEPT Provider Note   CSN: [id number] Arrival date & time: 12/21/19  1813     History Chief Complaint  Patient presents with  . Depression  . Addiction Problem    Kenneth Hunt is a 39 y.o. male.  39 yo M with a chief complaint of methamphetamine addiction.  Patient states that he has had worsening issues since he had moved into a house with friends.  They have unfortunately engaged in illegal activities and he has had a couple misdemeanors recently.  He feels that drugs are very difficult problem.  No like to try and quit.  He is unable to afford rehab and so was hoping to come to the hospital and get placed somewhere.  Denies suicidal or homicidal ideation denies active hallucinations.  Has chest pain sometimes when he uses meth but none currently.  Denies abdominal pain is nausea or vomiting.  Denies cough or fever.  The history is provided by the patient.  Depression This is a new problem. The current episode started 2 days ago. The problem occurs constantly. The problem has not changed since onset.Pertinent negatives include no chest pain, no abdominal pain, no headaches and no shortness of breath. Nothing aggravates the symptoms. Nothing relieves the symptoms.       Past Medical History:  Diagnosis Date  . Brachial plexus disorders 07/31/2018   Seen by [facility] neurology. MRI brain and cervical spine showed bilateral neural foraminal cysts C4-C5, C5-C6, C6-C7, brain within normal limits.  - continue cymbalta, flexeril, neurontin - f/u with Dr. Debbra Riding [facility] neurology 03/28/19  . Depression   . 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 HIV (human immunodeficiency virus infection) ([facility])   . TOS (thoracic outlet syndrome)     Patient Active Problem List   Diagnosis Date Noted  . 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 Injection of illicit drug within last 12  months 08/26/2019  . Bipolar 2 disorder ([facility]) 03/04/2019  . Human immunodeficiency virus (HIV) disease ([facility]) 10/01/2018  . Thoracic outlet syndrome 10/01/2018  . 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 Methamphetamine dependence ([facility]) 12/30/2017  . Moderate recurrent major depression ([facility]) 12/30/2017  . History of syphilis 12/30/2017    Past Surgical History:  Procedure Laterality Date  . tos surgery         Family History  Problem Relation Age of Onset  . COPD Mother     Social History   Tobacco Use  . Smoking status: Never Smoker  . Smokeless tobacco: Never Used  Substance Use Topics  . Alcohol use: Not Currently  . Drug use: Yes    Types: IV, Methamphetamines    Comment: last used last nght    Home Medications Prior to Admission medications   Medication Sig Start Date End Date Taking? Authorizing Provider  acetaminophen (TYLENOL) 500 MG tablet Take 1 tablet (500 mg total) by mouth every 6 (six) hours as needed. 11/11/19  Yes Lorelee New, PA-C  bictegravir-emtricitabine-tenofovir AF (BIKTARVY) 50-200-25 MG TABS tablet Take 1 tablet by mouth daily. Try to take at the same time each day with or without food. 07/23/19  Yes Blanchard Kelch, NP  DULoxetine (CYMBALTA) 60 MG capsule Take 1 capsule (60 mg total) by mouth daily. 12/17/19  Yes Blanchard Kelch, NP  gabapentin (NEURONTIN) 100 MG capsule Take 1 capsule (100 mg total) by mouth 3 (three) times daily. 12/17/19  Yes Blanchard Kelch, NP  Allergies    Haloperidol  Review of Systems   Review of Systems  Constitutional: Negative for chills and fever.  HENT: Negative for congestion and facial swelling.   Eyes: Negative for discharge and visual disturbance.  Respiratory: Negative for shortness of breath.   Cardiovascular: Negative for chest pain and palpitations.  Gastrointestinal: Negative for abdominal pain, diarrhea and vomiting.  Musculoskeletal: Negative for arthralgias and myalgias.  Skin: Negative  for color change and rash.  Neurological: Negative for tremors, syncope and headaches.  Psychiatric/Behavioral: Positive for depression. Negative for confusion and dysphoric mood.    Physical Exam Updated Vital Signs BP 102/62 (BP Location: Left Arm)   Pulse 90   Temp 98.2 F (36.8 C) (Oral)   Resp 14   Ht 5\' 3"  (1.6 m)   Wt 72.6 kg   SpO2 97%   BMI 28.34 kg/m   Physical Exam Vitals and nursing note reviewed.  Constitutional:      Appearance: He is well-developed.  HENT:     Head: Normocephalic and atraumatic.  Eyes:     Pupils: Pupils are equal, round, and reactive to light.  Neck:     Vascular: No JVD.  Cardiovascular:     Rate and Rhythm: Normal rate and regular rhythm.     Heart sounds: No murmur. No friction rub. No gallop.   Pulmonary:     Effort: No respiratory distress.     Breath sounds: No wheezing.  Abdominal:     General: There is no distension.     Tenderness: There is no guarding or rebound.  Musculoskeletal:        General: Normal range of motion.     Cervical back: Normal range of motion and neck supple.  Skin:    Coloration: Skin is not pale.     Findings: No rash.  Neurological:     Mental Status: He is alert and oriented to person, place, and time.  Psychiatric:        Mood and Affect: Mood is anxious. Affect is blunt.        Behavior: Behavior normal.     ED Results / Procedures / Treatments   Labs (all labs ordered are listed, but only abnormal results are displayed) Labs Reviewed - No data to display  EKG None  Radiology No results found.  Procedures Procedures (including critical care time)  Medications Ordered in ED Medications - No data to display  ED Course  I have reviewed the triage vital signs and the nursing notes.  Pertinent labs & imaging results that were available during my care of the patient were reviewed by me and considered in my medical decision making (see chart for details).    MDM  Rules/Calculators/A&P                      39 yo M with a chief complaint of methamphetamine addiction.  Here looking for placement.  Patient is somewhat concerned about his wellbeing by going back to his place of living.  I feel he is medically clear. Will let TTS evaluate.  Social work to evaluate for possible new living situation.  Social workers evaluated the patient. TTS has not yet evaluated but the patient would not like to wait and would prefer to go home. Will discharge patient home.  9:51 PM:  I have discussed the diagnosis/risks/treatment options with the patient and believe the pt to be eligible for discharge home to follow-up with PCP. We also  discussed returning to the ED immediately if new or worsening sx occur. We discussed the sx which are most concerning (e.g., sudden worsening pain, fever, inability to tolerate by mouth) that necessitate immediate return. Medications administered to the patient during their visit and any new prescriptions provided to the patient are listed below.  Medications given during this visit Medications - No data to display   The patient appears reasonably screen and/or stabilized for discharge and I doubt any other medical condition or other [facility] requiring further screening, evaluation, or treatment in the ED at this time prior to discharge.    Final Clinical Impression(s) / ED Diagnoses Final diagnoses:  Substance abuse [facility])    Rx / DC Orders ED Discharge Orders    None       Melene Plan, DO 12/21/19 2151

## 2019-12-21 NOTE — ED Triage Notes (Signed)
Per EMs- Patient c/o depression, but denies SI/HI. Patient states he last used meth last night. Patient also mentioned that he has missed 2 court dates. Patient is requesting rehab.

## 2019-12-22 ENCOUNTER — Telehealth: Payer: Self-pay

## 2019-12-22 MED ORDER — DOXYCYCLINE HYCLATE 100 MG PO TABS: 100.0000 mg | ORAL_TABLET | Freq: Two times a day (BID) | ORAL | 0 refills | Status: AC

## 2019-12-22 NOTE — Progress Notes (Signed)
12/22/2019 1122 am TOC CM left HIPAA compliant message for pt to return call. Isidoro Donning RN CCM, [facility] ED TOC CM [phone]

## 2019-12-22 NOTE — Progress Notes (Signed)
Notified lab results and treatment for chlamydia. Pt understood without questions. Pt requesting for another refill for Rx. Biktarvy--due to lose the bottle yesterday to a friend home.

## 2019-12-22 NOTE — Progress Notes (Addendum)
*  Late Entry* 12/21/19  Consult request has been received. CSW attempting to follow up at present time.  CSW met with pt who verbalized a desire for SU TX.  CSW provided pt with resources and counseled pt on how to travel to said resources in:  1. [place] [facility]  2. [place], [place] and Financial trader for [employer] and ...  [place] provided pt with sufficient local [place] and [place] bus passes for regional transportation to all these above locations.  CSW also provided pt with contact info for and how to seek admission into the [facility] Coord[facility] to request entry into the quarantine hotel program and then subsequent entry into local homeless shelters.  Pt's was appreciative and thanked the CSW.  CSW will continue to follow for D/C needs.  Kenneth Hunt. Kenneth Hunt  MSW, LCSW, LCAS, CCS Transitions of Care Clinical Social Worker Care Coordination Department Ph: [phone]

## 2019-12-22 NOTE — Telephone Encounter (Signed)
-----   Message from [place] sent at 12/22/2019 10:32 AM EDT ----- Regarding: misplaced meds Patient misplaced HIV meds and would like another prescription to be sent to the pharmacy

## 2019-12-22 NOTE — Telephone Encounter (Signed)
[facility] Patient Advocate Encounter  Spoke to [employer] [facility] this afternoon and they were able to process an early refill for the patient. He picked up 30-days of Biktarvy today without any problem.

## 2019-12-22 NOTE — Progress Notes (Signed)
Patient picked up this afternoon, 30-day supply of Biktarvy per [facility]

## 2019-12-22 NOTE — Addendum Note (Signed)
Addended by: Blanchard Kelch on: 12/22/2019 11:30 AM   Modules accepted: Orders

## 2019-12-22 NOTE — Progress Notes (Signed)
I will see if Rosann Auerbach can do a lost medication override or if Laroy Apple would approve a one time due to loss of medication. [facility] may be able to put in a [facility] 13 code which will indicate the urgent need of the patient for medication. Will update either [facility] or the patient once I hear.

## 2019-12-22 NOTE — Telephone Encounter (Signed)
Patient called office today stating he misplaced his meds and needs a refill. Cant remember exactly when he last filled his prescription, but does remember it was sometime in April. Advised patient to contact [facility] first to see if they would be okay to fill early. If not call our office back for assistance. Patient verbalized understanding. Lorenso Courier, [place]

## 2020-01-06 ENCOUNTER — Other Ambulatory Visit: Payer: Self-pay | Admitting: *Deleted

## 2020-01-06 DIAGNOSIS — B20 Human immunodeficiency virus [HIV] disease: Secondary | ICD-10-CM

## 2020-01-06 MED ORDER — BICTEGRAVIR-EMTRICITAB-TENOFOV 50-200-25 MG PO TABS: 1.0000 | ORAL_TABLET | Freq: Every day | ORAL | 5 refills | Status: DC

## 2020-01-13 ENCOUNTER — Telehealth: Payer: Self-pay | Admitting: *Deleted

## 2020-01-13 NOTE — Telephone Encounter (Signed)
Received signed records request from [facility]. They are requesting specifically treatment date for rectal chlamydia and notes for special nutritional needs. RN faxed last office note, labs. Patient treated 5/10 for chlamydia.  No nutritional needs noted, unsure how to address this question. Request for full records placed at front for [employer] to complete. Andree Coss, RN

## 2020-01-13 NOTE — Telephone Encounter (Signed)
No specific nutritional needs for his medications.

## 2020-01-28 ENCOUNTER — Other Ambulatory Visit: Payer: Self-pay | Admitting: Infectious Diseases

## 2020-01-28 DIAGNOSIS — B20 Human immunodeficiency virus [HIV] disease: Secondary | ICD-10-CM

## 2020-03-05 ENCOUNTER — Emergency Department (HOSPITAL_COMMUNITY): Payer: Medicare Other

## 2020-03-05 ENCOUNTER — Emergency Department (HOSPITAL_COMMUNITY)
Admission: EM | Admit: 2020-03-05 | Discharge: 2020-03-07 | Discharge status: Against Medical Advice | Payer: Medicare Other | Attending: Emergency Medicine | Admitting: Emergency Medicine

## 2020-03-05 DIAGNOSIS — F15129 Other stimulant abuse with intoxication, unspecified: Secondary | ICD-10-CM | POA: Insufficient documentation

## 2020-03-05 DIAGNOSIS — Z20822 Contact with and (suspected) exposure to covid-19: Secondary | ICD-10-CM | POA: Diagnosis not present

## 2020-03-05 DIAGNOSIS — R000 Tachycardia, unspecified: Secondary | ICD-10-CM | POA: Diagnosis not present

## 2020-03-05 DIAGNOSIS — F15929 Other stimulant use, unspecified with intoxication, unspecified: Secondary | ICD-10-CM

## 2020-03-05 DIAGNOSIS — R4182 Altered mental status, unspecified: Secondary | ICD-10-CM | POA: Diagnosis present

## 2020-03-05 LAB — CBC WITH DIFFERENTIAL/PLATELET
Abs Immature Granulocytes: 0.04 10*3/uL (ref 0.00–0.07)
Basophils Absolute: 0 10*3/uL (ref 0.0–0.1)
Basophils Relative: 0 %
Eosinophils Absolute: 0 10*3/uL (ref 0.0–0.5)
Eosinophils Relative: 0 %
HCT: 42.5 % (ref 39.0–52.0)
Hemoglobin: 14.3 g/dL (ref 13.0–17.0)
Immature Granulocytes: 0 %
Lymphocytes Relative: 6 %
Lymphs Abs: 0.8 10*3/uL (ref 0.7–4.0)
MCH: 30.9 pg (ref 26.0–34.0)
MCHC: 33.6 g/dL (ref 30.0–36.0)
MCV: 91.8 fL (ref 80.0–100.0)
Monocytes Absolute: 1 10*3/uL (ref 0.1–1.0)
Monocytes Relative: 8 %
Neutro Abs: 11.8 10*3/uL — ABNORMAL HIGH (ref 1.7–7.7)
Neutrophils Relative %: 86 %
Platelets: 237 10*3/uL (ref 150–400)
RBC: 4.63 MIL/uL (ref 4.22–5.81)
RDW: 14 % (ref 11.5–15.5)
WBC: 13.7 10*3/uL — ABNORMAL HIGH (ref 4.0–10.5)
nRBC: 0 % (ref 0.0–0.2)

## 2020-03-05 LAB — RAPID URINE DRUG SCREEN, HOSP PERFORMED
Amphetamines: POSITIVE — AB
Barbiturates: NOT DETECTED
Benzodiazepines: NOT DETECTED
Cocaine: NOT DETECTED
Opiates: NOT DETECTED
Tetrahydrocannabinol: NOT DETECTED

## 2020-03-05 LAB — CBG MONITORING, ED: Glucose-Capillary: 162 mg/dL — ABNORMAL HIGH (ref 70–99)

## 2020-03-05 LAB — COMPREHENSIVE METABOLIC PANEL
ALT: 15 U/L (ref 0–44)
AST: 30 U/L (ref 15–41)
Albumin: 4.5 g/dL (ref 3.5–5.0)
Alkaline Phosphatase: 66 U/L (ref 38–126)
Anion gap: 14 (ref 5–15)
BUN: 20 mg/dL (ref 6–20)
CO2: 20 mmol/L — ABNORMAL LOW (ref 22–32)
Calcium: 9.5 mg/dL (ref 8.9–10.3)
Chloride: 109 mmol/L (ref 98–111)
Creatinine, Ser: 1.68 mg/dL — ABNORMAL HIGH (ref 0.61–1.24)
GFR calc Af Amer: 58 mL/min — ABNORMAL LOW (ref >60)
GFR calc non Af Amer: 50 mL/min — ABNORMAL LOW (ref >60)
Glucose, Bld: 197 mg/dL — ABNORMAL HIGH (ref 70–99)
Potassium: 3.9 mmol/L (ref 3.5–5.1)
Sodium: 143 mmol/L (ref 135–145)
Total Bilirubin: 0.8 mg/dL (ref 0.3–1.2)
Total Protein: 7.4 g/dL (ref 6.5–8.1)

## 2020-03-05 LAB — SALICYLATE LEVEL: Salicylate Lvl: <7 mg/dL — ABNORMAL LOW (ref 7.0–30.0)

## 2020-03-05 LAB — SARS CORONAVIRUS 2 BY RT PCR (HOSPITAL ORDER, PERFORMED IN ~~LOC~~ HOSPITAL LAB): SARS Coronavirus 2: NEGATIVE

## 2020-03-05 LAB — ACETAMINOPHEN LEVEL: Acetaminophen (Tylenol), Serum: <10 ug/mL — ABNORMAL LOW (ref 10–30)

## 2020-03-05 LAB — ETHANOL: Alcohol, Ethyl (B): <10 mg/dL (ref <10)

## 2020-03-05 MED ORDER — SODIUM CHLORIDE 0.9 % IV BOLUS
1000.0000 mL | Freq: Once | INTRAVENOUS | Status: AC
  Administered 2020-03-05: 1000 mL via INTRAVENOUS

## 2020-03-05 MED ORDER — SODIUM CHLORIDE 0.9 % IV SOLN: INTRAVENOUS | Status: DC

## 2020-03-05 MED ORDER — SODIUM CHLORIDE 0.9 % IV BOLUS
500.0000 mL | Freq: Once | INTRAVENOUS | Status: AC
  Administered 2020-03-05: 500 mL via INTRAVENOUS

## 2020-03-05 MED ORDER — RISPERIDONE 1 MG PO TBDP
1.0000 mg | ORAL_TABLET | Freq: Two times a day (BID) | ORAL | Status: DC
  Administered 2020-03-05: 1 mg via ORAL
  Filled 2020-03-05 (×4): qty 1

## 2020-03-05 NOTE — ED Notes (Signed)
PT called out to this RN stating  "can you remove this 4-point restraint?" Asked patient to stop masturbating; PT started to state that he is "Jesus," as he continues to [employer]

## 2020-03-05 NOTE — ED Notes (Signed)
Mumbling to himself actively masterbating wrist restraints remains in place as does his  Ankle restraints

## 2020-03-05 NOTE — ED Provider Notes (Signed)
Pt signed out by Dr. Eudelia Bunch pending metabolizing drug ingestion/return to normal mentation. Pt observed for 8 hours during which time he continued to fondle genitals and make weird statements like "I am Worthy Rancher the Pooh." Because of ongoing abnormal behavior, consulted TTS. He is medically clear.  Pt signed out to oncoming provider pending TTS recs. Pt will be discharged to police custody if psych cleared.   Jvion Turgeon, Ambrose Finland, MD 03/05/20 1538

## 2020-03-05 NOTE — ED Provider Notes (Signed)
[facility] EMERGENCY DEPARTMENT Provider Note  CSN: [id number] Arrival date & time: 03/05/20 5329  Chief Complaint(s) Altered Mental Status  ED Triage Notes Filiberto Pinks, RN (Registered Nurse) . [facility] Emergency Medicine . Marland Kitchen Date of Service: 03/05/2020 3:36 AM . . Signed   Pt arrived via EMS where they were called out for delirium. Pt fought police and EMS gave 400 ketamine IM.  EMS reports meth use. Pt to be discharged with [employer]       HPI Kenneth Hunt is a 39 y.o. male who presents for excited delirium. Currently sedated after IM ketamine.   Remainder of history, ROS, and physical exam limited due to patient's condition (AMS). Additional information was obtained from EMS.   Level V Caveat.    HPI  Past Medical History Past Medical History:  Diagnosis Date  . 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 MRI brain and cervical spine showed bilateral neural foraminal cysts C4-C5, C5-C6, C6-C7, brain within normal limits.  - continue cymbalta, flexeril, neurontin - f/u with Dr. Debbra [facility] neurology 03/28/19  . Depression   . 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 HIV (human immunodeficiency virus infection) (HCC)   . TOS (thoracic outlet syndrome)    Patient Active Problem List   Diagnosis Date Noted  . 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 Injection of illicit drug within last 12 months 08/26/2019  . Bipolar 2 disorder (HCC) 03/04/2019  . Human immunodeficiency virus (HIV) disease (HCC) 10/01/2018  . Thoracic outlet syndrome 10/01/2018  . 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 Methamphetamine dependence (HCC) 12/30/2017  . Moderate recurrent major depression (HCC) 12/30/2017  . History of syphilis 12/30/2017   Home Medication(s) Prior to Admission medications   Medication Sig Start Date End Date Taking? Authorizing Provider  acetaminophen  (TYLENOL) 500 MG tablet Take 1 tablet (500 mg total) by mouth every 6 (six) hours as needed. 11/11/19   Green, Garrett L, PA-C  BIKTARVY 50-200-25 MG TABS tablet TAKE 1 TABLET BY MOUTH DAILY. TRY TO TAKE AT SAME TIMES EACH DAY WITH OR WITHOUT FOOD 01/28/20   Blanchard Kelch, NP  DULoxetine (CYMBALTA) 60 MG capsule Take 1 capsule (60 mg total) by mouth daily. 12/17/19   Blanchard Kelch, NP  gabapentin (NEURONTIN) 100 MG capsule Take 1 capsule (100 mg total) by mouth 3 (three) times daily. 12/17/19   Blanchard Kelch, NP                                                                                                                                    Past Surgical History Past Surgical History:  Procedure Laterality Date  . tos surgery     Family History Family History  Problem Relation Age of Onset  . COPD Mother     Social History Social History   Tobacco  Use  . Smoking status: Never Smoker  . Smokeless tobacco: Never Used  Vaping Use  . Vaping Use: Never used  Substance Use Topics  . Alcohol use: Not Currently  . Drug use: Yes    Types: IV, Methamphetamines    Comment: last used last nght   Allergies Haloperidol  Review of Systems Review of Systems  Unable to perform ROS: Mental status change    Physical Exam Vital Signs  I have reviewed the triage vital signs BP 128/77   Pulse (!) 141   Temp (!) 97.5 F (36.4 C) (Axillary)   Resp (!) 36   SpO2 95%   Physical Exam Constitutional:      General: He is not in acute distress.    Appearance: He is well-developed. He is not diaphoretic.  HENT:     Head: Normocephalic.     Right Ear: External ear normal.     Left Ear: External ear normal.  Eyes:     General: No scleral icterus.       Right eye: No discharge.        Left eye: No discharge.     Conjunctiva/sclera: Conjunctivae normal.     Pupils: Pupils are equal, round, and reactive to light.     Comments: Dilated pupils BL  Cardiovascular:     Rate and  Rhythm: Regular rhythm.     Pulses:          Radial pulses are 2+ on the right side and 2+ on the left side.       Dorsalis pedis pulses are 2+ on the right side and 2+ on the left side.     Heart sounds: Normal heart sounds. No murmur heard.  No friction rub. No gallop.   Pulmonary:     Effort: Pulmonary effort is normal. No respiratory distress.     Breath sounds: Normal breath sounds. No stridor.  Abdominal:     General: There is no distension.     Palpations: Abdomen is soft.     Tenderness: There is no abdominal tenderness.  Musculoskeletal:     Cervical back: Normal range of motion and neck supple. No bony tenderness.     Thoracic back: No bony tenderness.     Lumbar back: No bony tenderness.     Comments: Clavicle stable. Chest stable to AP/Lat compression. Pelvis stable to Lat compression. No obvious extremity deformity. No chest or abdominal wall contusion.  Skin:    General: Skin is warm.  Neurological:     Mental Status: He is alert and oriented to person, place, and time.     GCS: GCS eye subscore is 4. GCS verbal subscore is 5. GCS motor subscore is 6.     Comments: Moving all extremities      ED Results and Treatments Labs (all labs ordered are listed, but only abnormal results are displayed) Labs Reviewed  COMPREHENSIVE METABOLIC PANEL - Abnormal; Notable for the following components:      Result Value   CO2 20 (*)    Glucose, Bld 197 (*)    Creatinine, Ser 1.68 (*)    GFR calc non Af Amer 50 (*)    GFR calc Af Amer 58 (*)    All other components within normal limits  SALICYLATE LEVEL - Abnormal; Notable for the following components:   Salicylate Lvl <7.0 (*)    All other components within normal limits  ACETAMINOPHEN LEVEL - Abnormal; Notable for the following components:  Acetaminophen (Tylenol), Serum <10 (*)    All other components within normal limits  RAPID URINE DRUG SCREEN, HOSP PERFORMED - Abnormal; Notable for the following components:    Amphetamines POSITIVE (*)    All other components within normal limits  CBC WITH DIFFERENTIAL/PLATELET - Abnormal; Notable for the following components:   WBC 13.7 (*)    Neutro Abs 11.8 (*)    All other components within normal limits  CBG MONITORING, ED - Abnormal; Notable for the following components:   Glucose-Capillary 162 (*)    All other components within normal limits  ETHANOL                                                                                                                         EKG  EKG Interpretation  Date/Time:  Saturday March 05 2020 03:40:09 EDT Ventricular Rate:  140 PR Interval:    QRS Duration: 77 QT Interval:  285 QTC Calculation: 435 R Axis:   70 Text Interpretation: Sinus tachycardia Confirmed by Drema Pryardama, Mesiah Manzo [phone]([id number]) on 03/05/2020 3:54:29 AM      Radiology DG Chest Port 1 View  Result Date: 03/05/2020 CLINICAL DATA:  Tachycardia EXAM: PORTABLE CHEST 1 VIEW COMPARISON:  10/13/2019 FINDINGS: The heart size and mediastinal contours are within normal limits. Both lungs are clear. The visualized skeletal structures are unremarkable. IMPRESSION: No active disease. Electronically Signed   By: Deatra RobinsonKevin  Herman M.D.   On: 03/05/2020 04:50    Pertinent labs & imaging results that were available during my care of the patient were reviewed by me and considered in my medical decision making (see chart for details).  Medications Ordered in ED Medications  sodium chloride 0.9 % bolus 1,000 mL (0 mLs Intravenous Stopped 03/05/20 0548)    And  sodium chloride 0.9 % bolus 1,000 mL (0 mLs Intravenous Stopped 03/05/20 0548)    And  0.9 %  sodium chloride infusion ( Intravenous New Bag/Given 03/05/20 0557)  sodium chloride 0.9 % bolus 500 mL (500 mLs Intravenous New Bag/Given 03/05/20 [id number])                                                                                                                                    Procedures Procedures  (including critical  care time)  Medical Decision Making / ED Course I have reviewed the nursing notes for this encounter and the patient's prior records (  if available in EHR or on provided paperwork).   Raeford Ungerer was evaluated in Emergency Department on 03/05/2020 for the symptoms described in the history of present illness. He was evaluated in the context of the global COVID-19 pandemic, which necessitated consideration that the patient might be at risk for infection with the SARS-CoV-2 virus that causes COVID-19. Institutional protocols and algorithms that pertain to the evaluation of patients at risk for COVID-19 are in a state of rapid change based on information released by regulatory bodies including the CDC and federal and state organizations. These policies and algorithms were followed during the patient's care in the ED.  Excited delirium requiring sedation. No obvious signs of trauma requiring imaging at this time.  Known for methamphetamine use.  Patient provided with IV fluids. Screening labs reassuring. Plan to allow him to metabolize to freedom and reassess.  Reassessment one: Patient is more alert but responds nonsensically.  Reassessment 2: Slight improvement from above.  Reassessment 3: Patient now able to answer questions appropriately though still appears to be under the influence. He admitted to using crystal meth.  Patient care turned over to Dr Clarene Duke. Patient case and results discussed in detail; please see their note for further ED managment.          Final Clinical Impression(s) / ED Diagnoses Final diagnoses:  Methamphetamine intoxication (HCC)      This chart was dictated using voice recognition software.  Despite best efforts to proofread,  errors can occur which can change the documentation meaning.   Nira Conn, MD 03/05/20 [phone]

## 2020-03-05 NOTE — ED Provider Notes (Signed)
TTS has seen patient and recommend observation overnight and re-eval in the morning.  Home meds have not been reviewed by pharmacy, so I asked them to do this. Psych hold orders placed.   Jacalyn Lefevre, MD 03/05/20 2013

## 2020-03-05 NOTE — BH Assessment (Addendum)
Comprehensive Clinical Assessment (CCA) Screening, Triage and Referral Note  03/05/2020 Kenneth Hunt [id number] Patient presents with SA issues and arrived by EMS after they were called in reference to patient exhibiting ongoing psychosis. Patient resisted at the time when law enforcement arrived on scene and was given IM Ketamine. EMS reported amphetamine use. Patient is exhibiting continued inappropriate behavior since he arrived and is selectively mute as this Clinical research associate attempts to assess. Patient is noted to be laughing and making hand gestures at this Clinical research associate as Clinical research associate exited the room. Patent will not respond when asked any questions in reference to S/I, H/I or AVH. Information to complete assessment is obtained from admission notes and history.   Per notes this date [name] writes:  the Pt signed out by Dr. Eudelia Hunt pending metabolizing drug ingestion/return to normal mentation. Pt observed for 8 hours during which time he continued to fondle genitals and make weird statements like "I am Worthy Rancher the Pooh." Because of ongoing abnormal behavior, consulted TTS. He is medically clear.  Additional history is obtained from TTS consult 10/31/19. Following history is from that event of 10/31/19 and was utilized this date since patient was unable to provide information due to current altered mental state. Sprinkle LCAS writes on 10/31/19:  Kenneth Hunt is an 39 y.o. male who was brought to [facility] after having [place] with suicidal ideation with a plan to jump.  Patient states that he attempted suicide in the past by overdose in 2013.  Patient is a former [employer] fundraiser, who is disabled with some form of medical issue that he states keeps his hands from functioning properly.  He also has been diagnosed with bipolar disorder in the past and has HIV.  Patient admits to having both an addiction to methamphetamine as well as a sex addition and admits that he has been rather careless  with his life.  He states that he has been hanging around with the wrong people and doing things more often than he should.  (evidently drugs and sex).  Patient states that his parents are deceased and states that he has no emotional support other than his three dogs.  He eludes that he has been in previous relationships with others who have let him down and he states that he really has no trust for others and he states that he does not feel like people need to be trying to make decisions for him.    Patient states that he received outpatient BH services in 2013 after he attempted suicide.  However, he states that he has no current provider and states that he is currently not on any medications for depression.  Patient denies HI/Psychosis and states that for the most part that he has been sleeping and eating well.  Patient states that he has been using methamphetamines for the past five years. He states that he is currently using on two to three occasions per week, but will not elaborate on how much he is using each time, but does admit that he has experienced an increase in tolerance.  Patient states that he used to use heroin in the past, but did not provide any specifics.   Case was staffed with [name] who recommended patient be observed and monitored due to ongoing altered state. Patient will be seen by psychiatry in the a.m.   Visit Diagnosis: Unspecified psychosis   ICD-10-CM   1. Methamphetamine intoxication [facility])  F15.929     Patient  Reported Information How did you hear about [place]? Self   Referral name: No data recorded  Referral phone number: No data recorded Whom do you see for routine medical problems? I don't have a doctor   Practice/Facility Name: No data recorded  Practice/Facility Phone Number: No data recorded  Name of Contact: No data recorded  Contact Number: No data recorded  Contact Fax Number: No data recorded  Prescriber Name: No data recorded  Prescriber  Address (if known): No data recorded What Is the Reason for Your Visit/Call Today? S/I ansd SA issues  How Long Has This Been Causing You Problems? > than 6 months  Have You Recently Been in Any Inpatient Treatment (Hospital/Detox/Crisis Center/28-Day Program)? No   Name/Location of Program/Hospital:No data recorded  How Long Were You There? No data recorded  When Were You Discharged? No data recorded Have You Ever Received Services From [facility] Before? Yes   Who Do You See at [facility]? Pt has been assessed before by TTS  Have You Recently Had Any Thoughts About Hurting Yourself? No data recorded  Are You Planning to Commit Suicide/Harm Yourself At This time?  No data recorded Have you Recently Had Thoughts About Hurting Someone Kenneth Hunt? No data recorded  Explanation: No data recorded Have You Used Any Alcohol or Drugs in the Past 24 Hours? Yes   How Long Ago Did You Use Drugs or Alcohol?  No data recorded  What Did You Use and How Much? UTA  What Do You Feel Would Help You the Most Today? No data recorded Do You Currently Have a Therapist/Psychiatrist? No   Name of Therapist/Psychiatrist: No data recorded  Have You Been Recently Discharged From Any Office Practice or Programs? No   Explanation of Discharge From Practice/Program:  No data recorded    CCA Screening Triage Referral Assessment Type of Contact: Face-to-Face   Is this Initial or Reassessment? No data recorded  Date Telepsych consult ordered in [facility]:  No data recorded  Time Telepsych consult ordered in [facility]:  No data recorded Patient Reported Information Reviewed? No data recorded  Patient Left Without Being Seen? No data recorded  Reason for Not Completing Assessment: No data recorded Collateral Involvement: None at this time  Does Patient Have a Court Appointed Legal Guardian? No data recorded  Name and Contact of Legal Guardian:  No data recorded If Minor and Not Living with Parent(s), Who has Custody?  NA  Is CPS involved or ever been involved? Never  Is APS involved or ever been involved? Never  Patient Determined To Be At Risk for Harm To Self or Others Based on Review of Patient Reported Information or Presenting Complaint? No data recorded  Method: No data recorded  Availability of Means: No data recorded  Intent: No data recorded  Notification Required: No data recorded  Additional Information for Danger to Others Potential:  No data recorded  Additional Comments for Danger to Others Potential:  No data recorded  Are There Guns or Other Weapons in Your Home?  No data recorded   Types of Guns/Weapons: No data recorded   Are These Weapons Safely Secured?                              No data recorded   Who Could Verify You Are Able To Have These Secured:    No data recorded Do You Have any Outstanding Charges, Pending Court Dates, Parole/Probation? No data  recorded Contacted To Inform of Risk of Harm To Self or Others: No data recorded Location of Assessment: [facility] ED  Does Patient Present under Involuntary Commitment? No   IVC Papers Initial File Date: No data recorded  [place] of Residence: [place]  Patient Currently Receiving the Following Services: No data recorded  Determination of Need: No data recorded  Options For Referral: No data recorded  Alfredia Ferguson, LCAS

## 2020-03-05 NOTE — ED Notes (Signed)
Pt attempting to get out of bed, bed alarm went off. Pt followed staff commands and laid back down in bed.

## 2020-03-05 NOTE — ED Notes (Signed)
Registration called pt sister and sister requested to speak to this RN. RN updated her that pt is in stable condition at this time. Pt sister requested an update if anything changes with pt condition and to be notified when pt is discharged. RN at pt bedside, pt continues to attempt to pull at lines while in restraints. Pt talking to himself and making vulgar statements toward staff members.

## 2020-03-05 NOTE — ED Notes (Signed)
Pt bed alarm going off, staff entered room and found pt laying on the floor fondling himself and stating " I am God and I can fly". Dr. Eudelia Bunch at bedside and assisted staff with getting pt in bed. Verbal order received for non-violent restraints.

## 2020-03-05 NOTE — ED Notes (Signed)
PT removed all his clothes and monitors and continues to masturbate

## 2020-03-05 NOTE — ED Notes (Signed)
Hooked patient to the pulse oxy

## 2020-03-05 NOTE — ED Notes (Signed)
Pt touching genitals and making statements like " I love you so much, let me love you, I am God". RN attempted to cover pt genitals multiple times but pt uncovers himself and continues touching himself. Brief placed on pt to cover him up.

## 2020-03-05 NOTE — BH Assessment (Signed)
Case was staffed with [name] who recommended patient be observed and monitored due to ongoing altered state. Patient will be seen by psychiatry in the a.m.

## 2020-03-05 NOTE — Consult Note (Addendum)
  Case staffed with me by Murrell Redden at this time it is apparent that patients current state of psychosis is 2/t methamphetamine use. 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 Patient with similar presentation in 10/2019. Due to his medical conditions that further complicate his psychiatric treatment options will start Risperdal M tab 1 mg po BID to target psychosis and reduce the chances of producing EPS symptoms. I have placed an order for an EKG, if we are able to obtain one however do not delay administration of antipsychotic medication if this unobtainable due to his inappropriate behaviors and sadistic gestures to include masturbating. WIll reassess in the morning by psychiatry. It is with hopes that he will stable out in the morning, as he remains psychotic likely due to substance use.

## 2020-03-05 NOTE — ED Notes (Signed)
Pt lying with his penis exposed repeating  Suggestive phrases

## 2020-03-05 NOTE — ED Notes (Signed)
Talking to himself  Still  The pt placed the pill in his mpith  But as soon as he took water in his mouth he spit it out  We think that the pill stayed inside and he swallowed it  Repositioned in bed

## 2020-03-05 NOTE — ED Notes (Signed)
PT requests fluid. Fluid given and accepted by patient

## 2020-03-05 NOTE — ED Notes (Signed)
Patient has refused to keep his tele-monitor on He continues to masturbate whenever staff comes in the room

## 2020-03-05 NOTE — ED Triage Notes (Signed)
Pt arrived via EMS where they were called out for delirium. Pt fought police and EMS gave 400 ketamine IM.  EMS reports meth use. Pt to be discharged with [employer]

## 2020-03-05 NOTE — ED Notes (Signed)
Food offered no response  Water given

## 2020-03-06 NOTE — ED Notes (Signed)
Breakfast ordered 

## 2020-03-06 NOTE — Consult Note (Signed)
  Unable to see the patient this morning related no one knows where patient is located.  Room listed as OTC; spoke with several nurses that states that patient off the floor but unsure of location.

## 2020-03-06 NOTE — ED Notes (Signed)
Pt had pulled off his  Restraints all 4 and was standing by the bedside  Alarm going off  Placed back in the bed and restrained again  He appears to be a little more lucid and he is alert and oriented for the first time today since 1500 when I came on

## 2020-03-06 NOTE — ED Notes (Signed)
Pt up to br  Offered him a shower he  Was happy to have one  Restraints removed pt co-operative

## 2020-03-15 ENCOUNTER — Ambulatory Visit: Payer: Medicare Other | Admitting: Infectious Diseases

## 2020-03-18 ENCOUNTER — Ambulatory Visit: Payer: Medicare Other | Admitting: Infectious Diseases

## 2020-04-06 ENCOUNTER — Encounter: Payer: Self-pay | Admitting: Infectious Diseases

## 2020-04-06 ENCOUNTER — Ambulatory Visit (INDEPENDENT_AMBULATORY_CARE_PROVIDER_SITE_OTHER): Admitting: Infectious Diseases

## 2020-04-06 ENCOUNTER — Other Ambulatory Visit: Payer: Self-pay

## 2020-04-06 VITALS — BP 131/85 | HR 74 | Temp 98.0°F

## 2020-04-06 DIAGNOSIS — F1520 Other stimulant dependence, uncomplicated: Secondary | ICD-10-CM | POA: Diagnosis not present

## 2020-04-06 DIAGNOSIS — Z8619 Personal history of other infectious and parasitic diseases: Secondary | ICD-10-CM

## 2020-04-06 DIAGNOSIS — B20 Human immunodeficiency virus [HIV] disease: Secondary | ICD-10-CM

## 2020-04-06 NOTE — Patient Instructions (Signed)
Continue Biktarvy once a day Call upon release for an appointment

## 2020-04-07 LAB — T-HELPER CELL (CD4) - (RCID CLINIC ONLY)
CD4 % Helper T Cell: 35 % (ref 33–65)
CD4 T Cell Abs: 689 /uL (ref 400–1790)

## 2020-04-08 LAB — RPR: RPR Ser Ql: REACTIVE — AB

## 2020-04-08 LAB — HIV-1 RNA QUANT-NO REFLEX-BLD
HIV 1 RNA Quant: <20 Copies/mL — ABNORMAL HIGH
HIV-1 RNA Quant, Log: <1.3 Log cps/mL — ABNORMAL HIGH

## 2020-04-08 LAB — RPR TITER: RPR Titer: 1:1 {titer} — ABNORMAL HIGH

## 2020-04-08 LAB — FLUORESCENT TREPONEMAL AB(FTA)-IGG-BLD: Fluorescent Treponemal ABS: REACTIVE — AB

## 2020-05-08 NOTE — Assessment & Plan Note (Signed)
Continues to do very well on Biktarvy. No changes to his health aside from ongoing abuse of methamphetamines. He has access to his medicines while currently in prison. I asked him to please follow-up with me after he is released so we can help coordinate care and find him a PCP and work on substance use rehab. We will update pertinent labs today to ensure he is still undetectable. Return to clinic in 4 months or upon release.

## 2020-05-08 NOTE — Assessment & Plan Note (Signed)
Ongoing unfortunately has led to a incarceration. He will need some assistance at release but is working on resources now.

## 2020-05-08 NOTE — Progress Notes (Signed)
Name: Kenneth Hunt  DOB: 07-11-1981 MRN: [id number] PCP: Canadian Callas, NP    Patient Active Problem List   Diagnosis Date Noted  . Human immunodeficiency virus (HIV) disease ([place]) 10/01/2018    Priority: High  . Injection of illicit drug within last 12 months 08/26/2019    Priority: Medium  . Methamphetamine dependence ([place]) 12/30/2017    Priority: Medium  . 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 Bipolar 2 disorder ([place]) 03/04/2019  . Thoracic outlet syndrome 10/01/2018  . 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 Moderate recurrent major depression ([place]) 12/30/2017  . History of syphilis 12/30/2017     Brief Narrative:  Kenneth Hunt is a 39 y.o. male with well controlled HIV. Dx .  Transferred care from Kenneth Hunt in [place]  [phone])  VL < 20 recently with CD4 ~340 on Biktarvy  HIV Risk: MSM History of OIs: none known  Intake Labs 07/23/2019: Hep B sAg (-), sAb (-), cAb (-); Hep A (immune), Hep C (-) Quantiferon (-) HLA B*5701 (-) G6PD: () Toxo IgG: (-)   Previous Regimens: . Biktarvy   Genotypes: . None on record   Subjective:  CC:  Routine HIV care.  Substance use disorder    HPI: Here today in the presence of 2 guards from detention center. He had some trouble with ongoing methamphetamine use and is now in jail. Has had several ER visits for acute intoxication related events. [place] acknowledges he needs some help and is trying to help organize outpatient substance use treatment prior to discharge from jail. He also needs some help finding a primary care provider. He has full access to his Biktarvy and has continued to take it once a day without interruption.   Review of Systems  Constitutional: Negative for appetite change, chills, fatigue, fever and unexpected weight change.  Eyes: Negative for visual disturbance.  Respiratory: Negative for cough and shortness of breath.   Cardiovascular:  Negative for chest pain and leg swelling.  Gastrointestinal: Negative for abdominal pain, diarrhea and nausea.  Genitourinary: Negative for discharge, dysuria and genital sores.  Musculoskeletal: Negative for joint swelling.  Skin: Negative for color change and rash.  Neurological: Negative for dizziness and headaches.  Hematological: Negative for adenopathy.  Psychiatric/Behavioral: Positive for dysphoric mood and sleep disturbance. The patient is not nervous/anxious.        Substance use     Past Medical History:  Diagnosis Date  . Brachial plexus disorders 07/31/2018   Se[facility]y. MRI brain and cervical spine showed bilateral neural foraminal cysts C4-C5, C5-C6, C6-C7, brain within normal limits.  - continue cymbalta, flexeril, neurontin - f/u with Dr. Elana Alm [facility] neurology 03/28/19  . Depression   . 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 HIV (human immunodeficiency virus infection) ([place])   . TOS (thoracic outlet syndrome)     Outpatient Medications Prior to Visit  Medication Sig Dispense Refill  . acetaminophen (TYLENOL) 500 MG tablet Take 1 tablet (500 mg total) by mouth every 6 (six) hours as needed. 30 tablet 0  . BIKTARVY 50-200-25 MG TABS tablet TAKE 1 TABLET BY MOUTH DAILY. TRY TO TAKE AT SAME TIMES EACH DAY WITH OR WITHOUT FOOD (Patient taking differently: Take 1 tablet by mouth daily. Try to take at the same time each day with or without food) 30 tablet 0  . DULoxetine (CYMBALTA) 60 MG capsule Take 1 capsule (60 mg total) by mouth  daily. 30 capsule 2  . gabapentin (NEURONTIN) 100 MG capsule Take 1 capsule (100 mg total) by mouth 3 (three) times daily. 90 capsule 2   No facility-administered medications prior to visit.     Allergies  Allergen Reactions  . Haloperidol Other (See Comments)    Tardive dyskinesia    Social History   Tobacco Use  . Smoking status: Never Smoker  . Smokeless tobacco:  Never Used  Vaping Use  . Vaping Use: Never used  Substance Use Topics  . Alcohol use: Not Currently  . Drug use: Yes    Types: IV, Methamphetamines    Comment: last used last nght    Social History   Substance and Sexual Activity  Sexual Activity Yes  . Partners: Male   Comment: declined condoms; 1/21     Objective:   Vitals:   04/06/20 1551  BP: 131/85  Pulse: 74  Temp: 98 F (36.7 C)   There is no height or weight on file to calculate BMI.  Physical Exam HENT:     Mouth/Throat:     Mouth: No oral lesions.     Dentition: Normal dentition. No dental caries.  Eyes:     General: No scleral icterus. Cardiovascular:     Rate and Rhythm: Normal rate and regular rhythm.     Heart sounds: Normal heart sounds.  Pulmonary:     Effort: Pulmonary effort is normal.     Breath sounds: Normal breath sounds.  Abdominal:     General: There is no distension.     Palpations: Abdomen is soft.     Tenderness: There is no abdominal tenderness.  Lymphadenopathy:     Cervical: No cervical adenopathy.  Skin:    General: Skin is warm and dry.     Findings: No rash.  Neurological:     Mental Status: He is alert and oriented to person, place, and time.     Lab Results Lab Results  Component Value Date   WBC 13.7 (H) 03/05/2020   HGB 14.3 03/05/2020   HCT 42.5 03/05/2020   MCV 91.8 03/05/2020   PLT 237 03/05/2020    Lab Results  Component Value Date   CREATININE 1.68 (H) 03/05/2020   BUN 20 03/05/2020   NA 143 03/05/2020   K 3.9 03/05/2020   CL 109 03/05/2020   CO2 20 (L) 03/05/2020    Lab Results  Component Value Date   ALT 15 03/05/2020   AST 30 03/05/2020   ALKPHOS 66 03/05/2020   BILITOT 0.8 03/05/2020    Lab Results  Component Value Date   CHOL 155 07/23/2019   HDL 55 07/23/2019   LDLCALC 85 07/23/2019   TRIG 60 07/23/2019   CHOLHDL 2.8 07/23/2019   HIV 1 RNA Quant  Date Value  04/06/2020 <20 Copies/mL (H)  12/17/2019 <20 NOT DETECTED copies/mL    07/23/2019 42 copies/mL (H)   CD4 T Cell Abs (/uL)  Date Value  04/06/2020 689  12/17/2019 710  08/24/2019 723     Assessment & Plan:   Problem List Items Addressed This Visit      High   Human immunodeficiency virus (HIV) disease ([place]) (Chronic)    Continues to do very well on Biktarvy. No changes to his health aside from ongoing abuse of methamphetamines. He has access to his medicines while currently in prison. I asked him to please follow-up with me after he is released so we can help coordinate care and find him a  PCP and work on substance use rehab. We will update pertinent labs today to ensure he is still undetectable. Return to clinic in 4 months or upon release.      Relevant Orders   HIV-1 RNA quant-no reflex-bld (Completed)   RPR (Completed)   T-helper cell (CD4)- ([facility] clinic only) (Completed)     Medium   Methamphetamine dependence ([place]) - Primary    Ongoing unfortunately has led to a incarceration. He will need some assistance at release but is working on resources now.         Janene Madeira, MSN, NP-C [facility] for Infectious [place] Pager: [phone] Office: [phone]  05/08/20  3:35 PM

## 2020-05-11 ENCOUNTER — Other Ambulatory Visit: Payer: Self-pay

## 2020-05-11 DIAGNOSIS — B20 Human immunodeficiency virus [HIV] disease: Secondary | ICD-10-CM

## 2020-05-11 MED ORDER — BIKTARVY 50-200-25 MG PO TABS: ORAL_TABLET | ORAL | 5 refills | Status: DC

## 2020-05-12 ENCOUNTER — Telehealth: Payer: Self-pay

## 2020-05-12 NOTE — Telephone Encounter (Signed)
Patient called office today requesting referral for PCP that is located within same building at [facility]. Advised patient to first contact insurance to see which providers are in network. Patient verbalized understanding and will reach out today. Kenneth Hunt, [place]

## 2020-05-18 ENCOUNTER — Emergency Department (HOSPITAL_COMMUNITY)
Admission: EM | Admit: 2020-05-18 | Discharge: 2020-05-18 | Disposition: A | Discharge status: Home / Self Care | Payer: Medicare Other | Attending: Emergency Medicine | Admitting: Emergency Medicine

## 2020-05-18 ENCOUNTER — Telehealth: Payer: Self-pay

## 2020-05-18 DIAGNOSIS — B20 Human immunodeficiency virus [HIV] disease: Secondary | ICD-10-CM | POA: Insufficient documentation

## 2020-05-18 DIAGNOSIS — R4182 Altered mental status, unspecified: Secondary | ICD-10-CM | POA: Diagnosis present

## 2020-05-18 DIAGNOSIS — F1510 Other stimulant abuse, uncomplicated: Secondary | ICD-10-CM

## 2020-05-18 DIAGNOSIS — F1520 Other stimulant dependence, uncomplicated: Secondary | ICD-10-CM | POA: Diagnosis not present

## 2020-05-18 NOTE — Progress Notes (Signed)
Noted. Patient schedule.

## 2020-05-18 NOTE — ED Notes (Signed)
ED Provider at bedside. 

## 2020-05-18 NOTE — Telephone Encounter (Signed)
Attempted to call patient back. No answer. Will continue to follow. Kenneth Hunt

## 2020-05-18 NOTE — Telephone Encounter (Signed)
Thank you both!

## 2020-05-18 NOTE — Telephone Encounter (Signed)
Ok, I will put some up front for him. Thank you!

## 2020-05-18 NOTE — ED Notes (Signed)
Patient is resting comfortably. 

## 2020-05-18 NOTE — ED Notes (Signed)
Patient ambulated to the lobby with a steady gait.

## 2020-05-18 NOTE — Telephone Encounter (Signed)
Patient states he lost his bottle, this was a new bottle.  Patient requesting samples until he is able to refill his medication. Routing to provider and PharmD for approval. Valarie Cones

## 2020-05-18 NOTE — Telephone Encounter (Signed)
Patient returned call. Will be available to pick up medication tomorrow.  Kenneth Hunt

## 2020-05-18 NOTE — ED Triage Notes (Signed)
Patient here via EMS after found walking around a parking lot masturbating.

## 2020-05-18 NOTE — Telephone Encounter (Signed)
Sure. I can put some sample up front for him to pick up if needed.

## 2020-05-18 NOTE — ED Provider Notes (Signed)
[facility]-EMERGENCY DEPT Provider Note   CSN: [id number] Arrival date & time: 05/18/20  0305     History Chief Complaint  Patient presents with  . Altered Mental Status    Patient found walking around a parking lot masterbating looking for other men, found by the business's security.    Kenneth Hunt is a 39 y.o. male.  HPI     This a 39 year old male with a history of polysubstance abuse, HIV who presents by EMS after being found in a parking lot masturbating.  Patient was noted to be altered.  Vital signs were stable in route.  On my evaluation, patient is somnolent but arousable.  He is disoriented.  He does report that he shot up methamphetamines earlier this evening.  Denies any other alcohol or drug use.  When asked what he was doing in the parking lot all he does is apologize.  He denies any physical complaints including chest pain, shortness of breath, abdominal pain, nausea, vomiting.  Per nursing report, EMS reported that he was given 2.5 mg of Versed and 5 mg of Haldol because he was being sexually aggressive towards EMS staff.  Past Medical History:  Diagnosis Date  . Brachial plexus disorders 07/31/2018   Seen by [facility] neurology. MRI brain and cervical spine showed bilateral neural foraminal cysts C4-C5, C5-C6, C6-C7, brain within normal limits.  - continue cymbalta, flexeril, neurontin - f/u with Dr. Debbra Riding [facility] neurology 03/28/19  . Depression   . 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 HIV (human immunodeficiency virus infection) (HCC)   . TOS (thoracic outlet syndrome)     Patient Active Problem List   Diagnosis Date Noted  . 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 Injection of illicit drug within last 12 months 08/26/2019  . Bipolar 2 disorder (H[facility]) 03/04/2019  . Human immunodeficiency virus (HIV) disease (H[facility]) 10/01/2018  . Thoracic outlet syndrome  10/01/2018  . 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 Methamphetamine dependence (H[facility]) 12/30/2017  . Moderate recurrent major depression (H[facility]) 12/30/2017  . History of syphilis 12/30/2017    Past Surgical History:  Procedure Laterality Date  . tos surgery         Family History  Problem Relation Age of Onset  . COPD Mother     Social History   Tobacco Use  . Smoking status: Never Smoker  . Smokeless tobacco: Never Used  Vaping Use  . Vaping Use: Never used  Substance Use Topics  . Alcohol use: Not Currently  . Drug use: Yes    Types: IV, Methamphetamines    Comment: last used last nght    Home Medications Prior to Admission medications   Medication Sig Start Date End Date Taking? Authorizing Provider  bictegravir-emtricitabine-tenofovir AF (BIKTARVY) 50-200-25 MG TABS tablet TAKE 1 TABLET BY MOUTH DAILY. TRY TO TAKE AT SAME TIMES EACH DAY WITH OR WITHOUT FOOD 05/11/20  Yes Blanchard Kelch, NP  DULoxetine (CYMBALTA) 60 MG capsule Take 1 capsule (60 mg total) by mouth daily. 12/17/19  Yes Blanchard Kelch, NP  gabapentin (NEURONTIN) 100 MG capsule Take 1 capsule (100 mg total) by mouth 3 (three) times daily. 12/17/19  Yes Blanchard Kelch, NP  acetaminophen (TYLENOL) 500 MG tablet Take 1 tablet (500 mg total) by mouth every 6 (six) hours as needed. 11/11/19   Lorelee New, PA-C    Allergies    Haloperidol  Review of Systems  Review of Systems  Constitutional: Negative for fever.  Respiratory: Negative for shortness of breath.   Cardiovascular: Negative for chest pain.  Gastrointestinal: Negative for abdominal pain, nausea and vomiting.  Psychiatric/Behavioral: Positive for behavioral problems and confusion.  All other systems reviewed and are negative.   Physical Exam Updated Vital Signs BP 107/61   Pulse 93   Temp 98.2 F (36.8 C) (Oral)   Resp 18   SpO2 99%   Physical Exam Vitals and nursing note reviewed.  Constitutional:      Appearance: He  is well-developed.     Comments: Disheveled appearing, nontoxic, no acute distress  HENT:     Head: Normocephalic and atraumatic.     Mouth/Throat:     Mouth: Mucous membranes are dry.  Eyes:     Pupils: Pupils are equal, round, and reactive to light.     Comments: Bilateral injected conjunctive a  Cardiovascular:     Rate and Rhythm: Regular rhythm. Tachycardia present.     Heart sounds: Normal heart sounds. No murmur heard.   Pulmonary:     Effort: Pulmonary effort is normal. No respiratory distress.     Breath sounds: Normal breath sounds. No wheezing.  Abdominal:     Palpations: Abdomen is soft.     Tenderness: There is no abdominal tenderness.  Musculoskeletal:     Cervical back: Neck supple.     Right lower leg: No edema.     Left lower leg: No edema.  Lymphadenopathy:     Cervical: No cervical adenopathy.  Skin:    General: Skin is warm and dry.  Neurological:     Mental Status: He is alert.     Comments: Somnolent but arousable, oriented to self but not place or time follows commands, moves all 4 extremities equally  Psychiatric:     Comments: Appears high     ED Results / Procedures / Treatments   Labs (all labs ordered are listed, but only abnormal results are displayed) Labs Reviewed - No data to display  EKG None  Radiology No results found.  Procedures Procedures (including critical care time)  Medications Ordered in ED Medications - No data to display  ED Course  I have reviewed the triage vital signs and the nursing notes.  Pertinent labs & imaging results that were available during my care of the patient were reviewed by me and considered in my medical decision making (see chart for details).    MDM Rules/Calculators/A&P                           This a 39 year old male who presents by EMS with concerns for hypersexual behavior.  He is intermittently somnolent but arousable.  Only oriented to himself at this time but does endorse  methamphetamine use.  He now has both Versed and Haldol on board as well.  He has no physical complaints.  He is currently calm and cooperative.  We will allowed to metabolize.    6:23 AM Patient is awake and alert.  He is able to ambulate independently to the bathroom.  We will plan for discharge without further work-up.  After history, exam, and medical workup I feel the patient has been appropriately medically screened and is safe for discharge home. Pertinent diagnoses were discussed with the patient. Patient was given return precautions.   Final Clinical Impression(s) / ED Diagnoses Final diagnoses:  Methamphetamine abuse (HCC)    Rx / DC  Orders ED Discharge Orders    None       Tasha Jindra, Mayer Masker, MD 05/18/20 [phone]

## 2020-05-18 NOTE — ED Triage Notes (Signed)
Patient was given 2.5 of versed and 5mg  of haldol by ems.

## 2020-05-24 ENCOUNTER — Ambulatory Visit: Payer: Medicare Other

## 2020-06-21 ENCOUNTER — Other Ambulatory Visit: Payer: Self-pay | Admitting: Pharmacist

## 2020-06-21 ENCOUNTER — Telehealth: Payer: Self-pay

## 2020-06-21 ENCOUNTER — Other Ambulatory Visit (HOSPITAL_COMMUNITY): Payer: Self-pay | Admitting: Pharmacist

## 2020-06-21 DIAGNOSIS — B20 Human immunodeficiency virus [HIV] disease: Secondary | ICD-10-CM

## 2020-06-21 MED ORDER — BIKTARVY 50-200-25 MG PO TABS: 1.0000 | ORAL_TABLET | Freq: Every day | ORAL | 0 refills | Status: DC

## 2020-06-21 MED FILL — BIKTARVY 50-200-25 MG TABS: 50-200-25 | 30 days supply | Qty: 30 | Fill #0

## 2020-06-21 NOTE — Progress Notes (Signed)
Patient lost his medications. Sending to [facility] so they can do a one time fill for him.

## 2020-06-21 NOTE — Telephone Encounter (Signed)
[facility] Patient Advocate Encounter  Patient lost medication Susanne Borders), I was able to fill and ER fill at [facility].    Clearance Coots, CPhT Specialty Pharmacy Patient [facility] for Infectious Disease Phone: [phone] Fax:  [phone]

## 2020-06-27 ENCOUNTER — Other Ambulatory Visit: Payer: Self-pay

## 2020-06-27 ENCOUNTER — Encounter: Payer: Self-pay | Admitting: Infectious Diseases

## 2020-06-27 ENCOUNTER — Ambulatory Visit (INDEPENDENT_AMBULATORY_CARE_PROVIDER_SITE_OTHER): Payer: Medicare Other | Admitting: Infectious Diseases

## 2020-06-27 VITALS — BP 135/71 | HR 103 | Wt 157.0 lb

## 2020-06-27 DIAGNOSIS — B20 Human immunodeficiency virus [HIV] disease: Secondary | ICD-10-CM

## 2020-06-27 DIAGNOSIS — F1520 Other stimulant dependence, uncomplicated: Secondary | ICD-10-CM | POA: Diagnosis not present

## 2020-06-27 DIAGNOSIS — L509 Urticaria, unspecified: Secondary | ICD-10-CM

## 2020-06-27 DIAGNOSIS — Z23 Encounter for immunization: Secondary | ICD-10-CM | POA: Diagnosis not present

## 2020-06-27 DIAGNOSIS — Z8619 Personal history of other infectious and parasitic diseases: Secondary | ICD-10-CM | POA: Diagnosis not present

## 2020-06-27 DIAGNOSIS — A549 Gonococcal infection, unspecified: Secondary | ICD-10-CM

## 2020-06-27 MED ORDER — PREDNISONE 10 MG (21) PO TBPK: ORAL_TABLET | ORAL | 0 refills | Status: DC

## 2020-06-27 MED ORDER — HYDROXYZINE HCL 25 MG PO TABS: 25.0000 mg | ORAL_TABLET | Freq: Three times a day (TID) | ORAL | 0 refills | Status: DC | PRN

## 2020-06-27 MED ORDER — TRIAMCINOLONE ACETONIDE 0.5 % EX OINT: 1.0000 "application " | TOPICAL_OINTMENT | Freq: Two times a day (BID) | CUTANEOUS | 0 refills | Status: DC

## 2020-06-27 NOTE — Progress Notes (Signed)
Name: Kenneth Hunt  DOB: 30-Sep-1980 MRN: [id number] PCP: Kenneth Callas, NP     Brief Narrative:  Kenneth Hunt is a 39 y.o. male with well controlled HIV. Dx .  Transferred care from [place] in [place]  [phone])  VL < 20 recently with CD4 ~340 on Biktarvy  HIV Risk: MSM History of OIs: none known  Intake Labs 07/23/2019: Hep B sAg (-), sAb (-), cAb (-); Hep A (immune), Hep C (-) Quantiferon (-) HLA B*5701 (-) G6PD: () Toxo IgG: (-)   Previous Regimens: . Biktarvy   Genotypes: . None on record   Subjective:   Chief Complaint  Patient presents with  . Follow-up    missed about 1.5 weeks of biktarvy due to incarceration; reports staying in a homeless shelter, noticed bumps on his arms and legs, does notice itching; flu shot; declined condoms      HPI: Here today for follow up HIV care after release from jail.  Main complaint today is concern over bedbug infestation. He has new welts around arms and on legs. He has tried to get all new clothes and get rid of the bugs bur rash keeps coming back. Hard to change mattress and sheets given environmental limitations. Still using meth heavily. Has not used much to help with the itchy welts and requesting for help with these.    Review of Systems  Constitutional: Negative for appetite change, chills, fatigue, fever and unexpected weight change.  Eyes: Negative for visual disturbance.  Respiratory: Negative for cough and shortness of breath.   Cardiovascular: Negative for chest pain and leg swelling.  Gastrointestinal: Negative for abdominal pain, diarrhea and nausea.  Genitourinary: Negative for discharge, dysuria and genital sores.  Musculoskeletal: Negative for joint swelling.  Skin: Positive for rash. Negative for color change.  Neurological: Negative for dizziness and headaches.  Hematological: Negative for adenopathy.  Psychiatric/Behavioral: Positive for dysphoric mood and  sleep disturbance. The patient is not nervous/anxious.        Substance use     Past Medical History:  Diagnosis Date  . Brachial plexus disorders 07/31/2018   Seen by [facility] neurology. MRI brain and cervical spine showed bilateral neural foraminal cysts C4-C5, C5-C6, C6-C7, brain within normal limits.  - continue cymbalta, flexeril, neurontin - f/u with Dr. Elana Alm [facility] neurology 03/28/19  . Depression   . 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 HIV (human immunodeficiency virus infection) ([place])   . TOS (thoracic outlet syndrome)     Outpatient Medications Prior to Visit  Medication Sig Dispense Refill  . bictegravir-emtricitabine-tenofovir AF (BIKTARVY) 50-200-25 MG TABS tablet Take 1 tablet by mouth daily. 30 tablet 0  . acetaminophen (TYLENOL) 500 MG tablet Take 1 tablet (500 mg total) by mouth every 6 (six) hours as needed. 30 tablet 0  . DULoxetine (CYMBALTA) 60 MG capsule Take 1 capsule (60 mg total) by mouth daily. 30 capsule 2  . gabapentin (NEURONTIN) 100 MG capsule Take 1 capsule (100 mg total) by mouth 3 (three) times daily. 90 capsule 2   No facility-administered medications prior to visit.     Allergies  Allergen Reactions  . Haloperidol Other (See Comments)    Tardive dyskinesia    Social History   Tobacco Use  . Smoking status: Never Smoker  . Smokeless tobacco: Never Used  Vaping Use  . Vaping Use: Never used  Substance Use Topics  . Alcohol use: Not Currently  .  Drug use: Yes    Types: IV, Methamphetamines    Comment: last used last nght    Social History   Substance and Sexual Activity  Sexual Activity Yes  . Partners: Male   Comment: declined condoms; 1/21     Objective:   Vitals:   06/27/20 1539  BP: 135/71  Pulse: (!) 103  Weight: 157 lb (71.2 kg)   Body mass index is 27.81 kg/m.  Physical Exam HENT:     Mouth/Throat:     Mouth: No oral lesions.     Dentition: Normal  dentition. No dental caries.  Eyes:     General: No scleral icterus. Cardiovascular:     Rate and Rhythm: Normal rate and regular rhythm.     Heart sounds: Normal heart sounds.  Pulmonary:     Effort: Pulmonary effort is normal.     Breath sounds: Normal breath sounds.  Abdominal:     General: There is no distension.     Palpations: Abdomen is soft.     Tenderness: There is no abdominal tenderness.  Lymphadenopathy:     Cervical: No cervical adenopathy.  Skin:    General: Skin is warm and dry.     Findings: No rash.     Comments: Multiple wheals noted on patients forearms. Some scabbed and ulcerated with itching.   Neurological:     Mental Status: He is alert and oriented to person, place, and time.     Lab Results Lab Results  Component Value Date   WBC 13.7 (H) 03/05/2020   HGB 14.3 03/05/2020   HCT 42.5 03/05/2020   MCV 91.8 03/05/2020   PLT 237 03/05/2020    Lab Results  Component Value Date   CREATININE 1.68 (H) 03/05/2020   BUN 20 03/05/2020   NA 143 03/05/2020   K 3.9 03/05/2020   CL 109 03/05/2020   CO2 20 (L) 03/05/2020    Lab Results  Component Value Date   ALT 15 03/05/2020   AST 30 03/05/2020   ALKPHOS 66 03/05/2020   BILITOT 0.8 03/05/2020    Lab Results  Component Value Date   CHOL 155 07/23/2019   HDL 55 07/23/2019   LDLCALC 85 07/23/2019   TRIG 60 07/23/2019   CHOLHDL 2.8 07/23/2019   HIV 1 RNA Quant  Date Value  06/27/2020 <20 Copies/mL (H)  04/06/2020 <20 Copies/mL (H)  12/17/2019 <20 NOT DETECTED copies/mL   CD4 T Cell Abs (/uL)  Date Value  06/27/2020 859  04/06/2020 689  12/17/2019 710     Assessment & Plan:   Problem List Items Addressed This Visit      High   Human immunodeficiency virus (HIV) disease ([place]) (Chronic)    Will update pertinent labs and help him continue Biktarvy today. Lost his bottle recently - samples supplied. STI screen today with multiple partner history. Flu shot today. Counseled re: COVID  vaccine.  Return in about 3 months (around 09/27/2020).       Relevant Orders   HIV-1 RNA quant-no reflex-bld (Completed)   T-helper cell (CD4)- ([facility] clinic only) (Completed)   RPR (Completed)     Medium   Methamphetamine dependence ([place]) - Primary    Ongoing and pre-contemplative at this time. Will continue to offer support and help to quit.         Unprioritized   Urticaria    With numerous lesions will treat with prednisone burst, topical steroids and hydroxyzine. Discussed needing to help eliminate in the environment  or this will continue to happen.        Other Visit Diagnoses    Need for immunization against influenza       Relevant Orders   Flu Vaccine QUAD 36+ mos IM (Completed)      Janene Madeira, MSN, NP-C [facility] for [place] Pager: [phone] Office: [phone]  09/06/20  10:01 PM

## 2020-06-27 NOTE — Patient Instructions (Signed)
For your prednisone:  On day 1 and 2 take 4 pills in the morning with food  On day 3 and 4 take 3 pills in the morning with food  On day 5 and 6 take 2 pills in the morning with food  On day 7-8-9 take 1 pill in the morning with food and stop   Hydroxyzine for you to help with the itching as well as prednisone ( this is an antihistamine )   Please continue your Biktarvy every day and follow up again in 3 months.

## 2020-06-28 ENCOUNTER — Ambulatory Visit: Payer: Medicare Other | Admitting: Infectious Diseases

## 2020-06-28 LAB — T-HELPER CELL (CD4) - (RCID CLINIC ONLY)
CD4 % Helper T Cell: 35 % (ref 33–65)
CD4 T Cell Abs: 859 /uL (ref 400–1790)

## 2020-06-30 ENCOUNTER — Telehealth: Payer: Self-pay

## 2020-06-30 ENCOUNTER — Ambulatory Visit: Payer: Medicare Other | Admitting: Infectious Diseases

## 2020-06-30 LAB — RPR: RPR Ser Ql: REACTIVE — AB

## 2020-06-30 LAB — HIV-1 RNA QUANT-NO REFLEX-BLD
HIV 1 RNA Quant: <20 Copies/mL — ABNORMAL HIGH
HIV-1 RNA Quant, Log: <1.3 Log cps/mL — ABNORMAL HIGH

## 2020-06-30 LAB — RPR TITER: RPR Titer: 1:1 {titer} — ABNORMAL HIGH

## 2020-06-30 LAB — FLUORESCENT TREPONEMAL AB(FTA)-IGG-BLD: Fluorescent Treponemal ABS: REACTIVE — AB

## 2020-06-30 NOTE — Telephone Encounter (Signed)
-----   Message from Blanchard Kelch, NP sent at 06/30/2020 12:46 PM EST ----- Please try to reach Mok[place] to let him know that his CD4 is over 800 and his viral load remains undetectable. Great work.   Hopefully the medications I sent him in for the rashes are starting to work .

## 2020-06-30 NOTE — Telephone Encounter (Signed)
Left HIPAA compliant voicemail requesting callback to discuss lab results.   Kirah Stice D Minna Dumire, RN  

## 2020-07-06 ENCOUNTER — Telehealth: Payer: Self-pay

## 2020-07-06 MED ORDER — PERMETHRIN 5 % EX CREA: 1.0000 "application " | TOPICAL_CREAM | Freq: Once | CUTANEOUS | 0 refills | Status: AC

## 2020-07-06 NOTE — Telephone Encounter (Signed)
Patient states he is staying in a homeless shelter (Psychologist, clinical.)  Patient states states last Thursday he was seen in an Urgent Care for possible bed bug infestation where he was residing at the time. Patient was treated with permethrin cream which did help some. Patient states he is currently drying his clothing and re-washing everything. Patient advised that if the main source is not treated than he will continue to have this issue. Patient verbalized understanding.  Patient is requesting new rx for cream.  Routing to NP for advise. Valarie Cones

## 2020-07-06 NOTE — Addendum Note (Signed)
Addended by: Blanchard Kelch on: 07/06/2020 04:42 PM   Modules accepted: Orders

## 2020-07-06 NOTE — Telephone Encounter (Signed)
Relayed to patient. He is sleeping on a mattress (still wrapped in the plastic it was purchased in) that is on a metal bed frame. He is drying his clothing and duffel bags on high heat every couple of days. He felt the permethrin cream was somewhat effective, will try the steroid cream/antihistamine.  He did not want to use steroid and permethrin at the same time. Also relayed previous lab results to him. He is grateful for all of the help. Andree Coss, RN

## 2020-07-06 NOTE — Telephone Encounter (Signed)
Yes I worry this will continue.   Did he pick up the steroid creams and antihistamines I gave him last time? If it is bedbugs he will continue getting bit and the permethrin is not going to help. If there is concern over scabies then the permethrin may be helpful, but again will continue to re-infest if no change to the environment.   Is he at all able to wrap his mattress in plastic? Launder linens?  I know this is tough for him.   I can send in one more application for him but really the root cause needs to be fixed for him.   Can you please let him know current lab results also? We have been trying to reach him.

## 2020-07-27 IMAGING — CR DG WRIST COMPLETE 3+V*L*
4 series · 4 of 4 positions shown · non-contrast
Comparison: None.

CLINICAL DATA: Left wrist pain and swelling

EXAM:
LEFT WRIST - COMPLETE 3+ VIEW

[wrist pa]
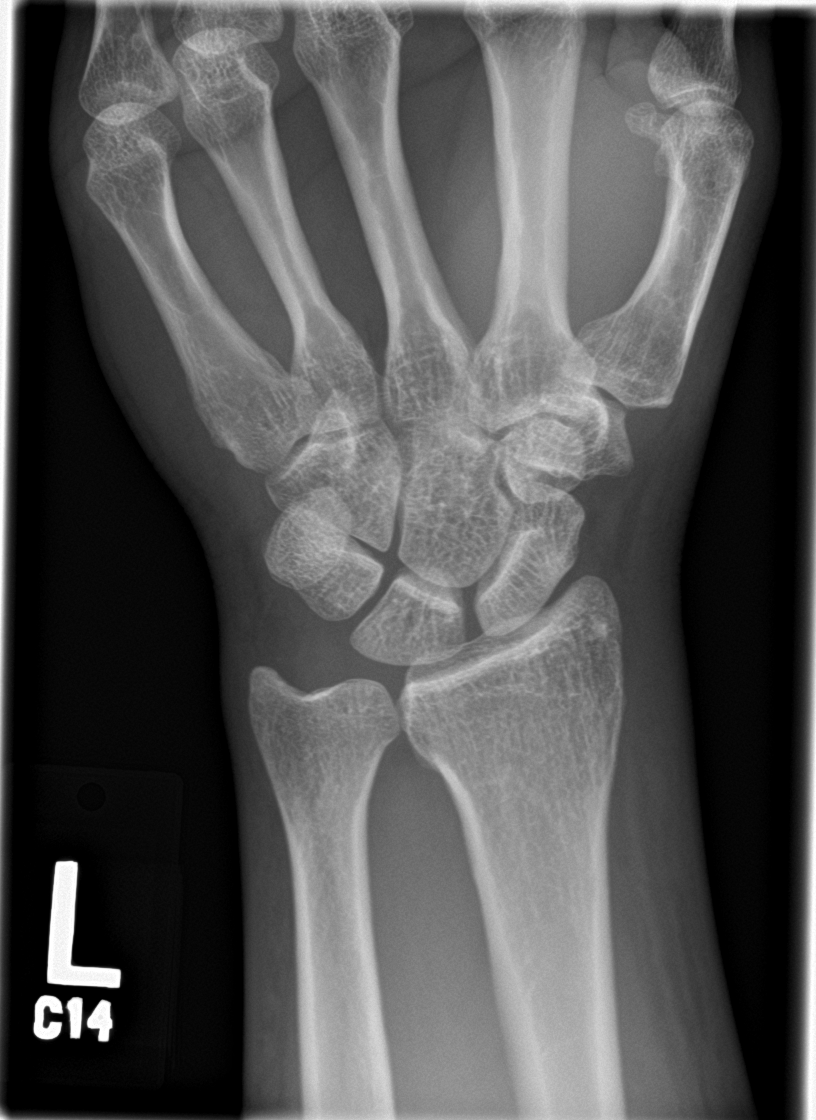

[wrist obl]
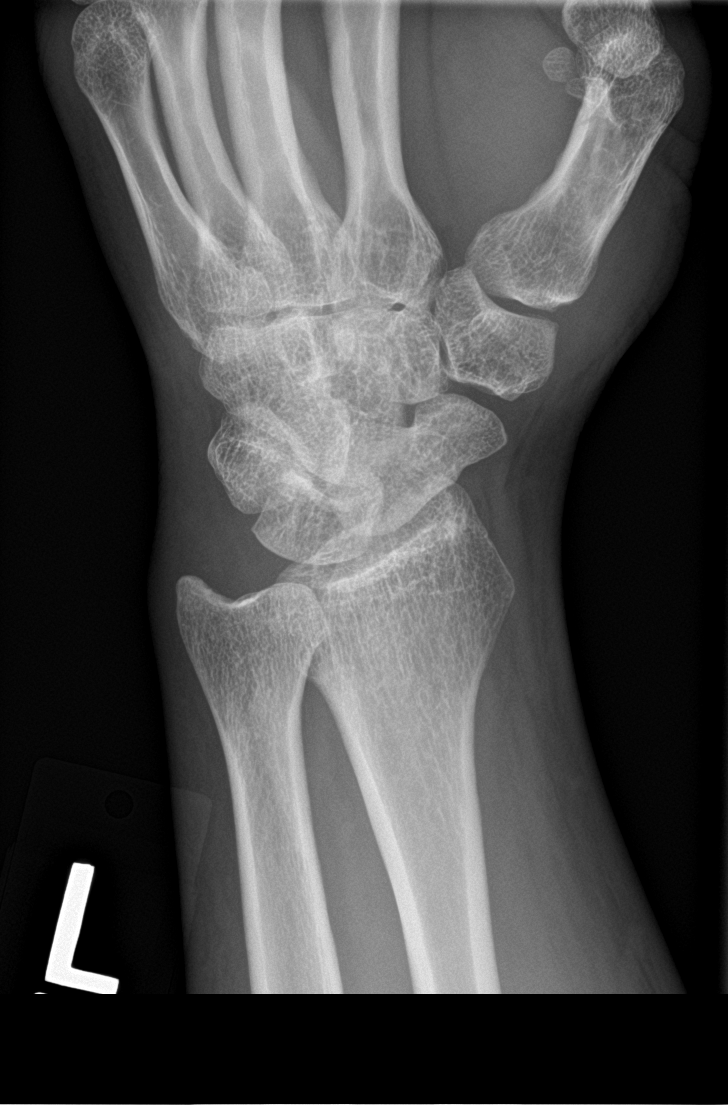

[wrist lat]
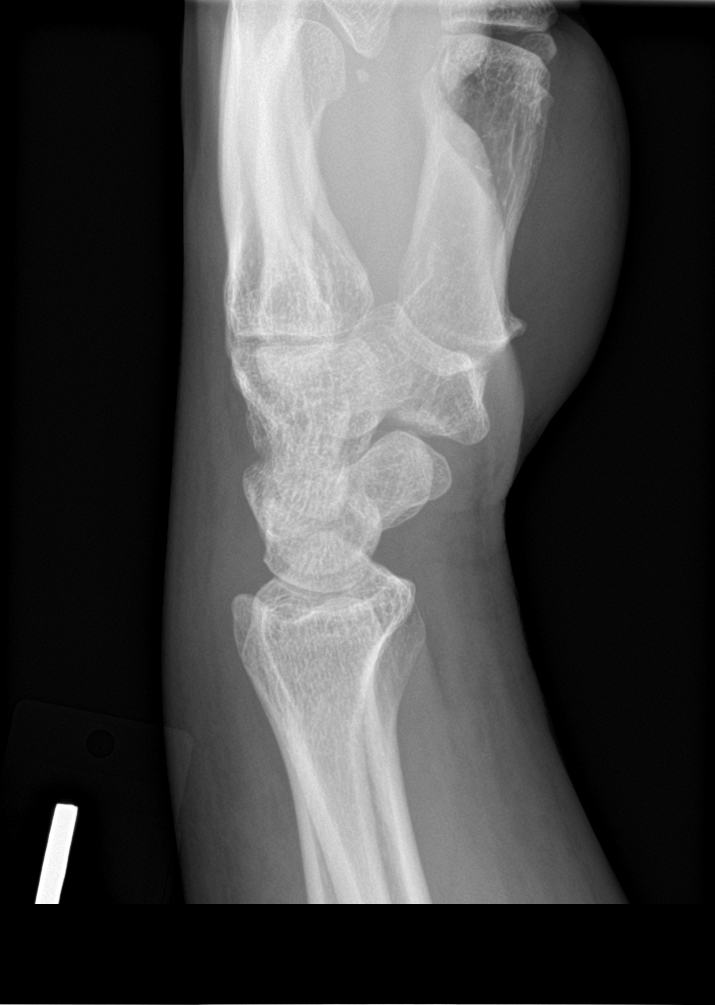

[wrist navicular]
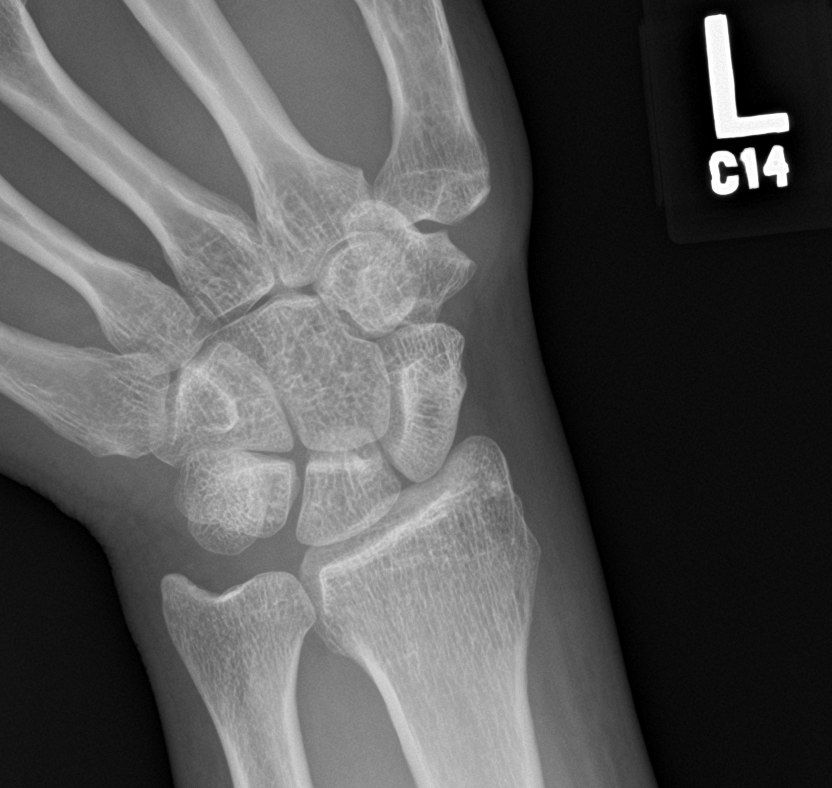

[4 of 4 positions shown; findings below may reference images not displayed]

FINDINGS: There is no evidence of fracture or dislocation. There is no
evidence of arthropathy or other focal bone abnormality. Soft tissue
swelling most pronounced at the dorsum of the wrist.
IMPRESSION: Soft tissue swelling without evidence of fracture or dislocation.

## 2020-09-06 DIAGNOSIS — L509 Urticaria, unspecified: Secondary | ICD-10-CM | POA: Insufficient documentation

## 2020-09-06 NOTE — Assessment & Plan Note (Addendum)
Will update pertinent labs and help him continue Biktarvy today. Lost his bottle recently - samples supplied. STI screen today with multiple partner history. Flu shot today. Counseled re: COVID vaccine.  Return in about 3 months (around 09/27/2020).

## 2020-09-06 NOTE — Assessment & Plan Note (Signed)
Ongoing and pre-contemplative at this time. Will continue to offer support and help to quit.

## 2020-09-06 NOTE — Assessment & Plan Note (Signed)
With numerous lesions will treat with prednisone burst, topical steroids and hydroxyzine. Discussed needing to help eliminate in the environment or this will continue to happen.

## 2020-09-28 ENCOUNTER — Ambulatory Visit: Payer: Medicare Other | Admitting: Infectious Diseases

## 2020-10-07 ENCOUNTER — Telehealth: Payer: Self-pay

## 2020-10-07 NOTE — Telephone Encounter (Signed)
Patient recently released from jail. Requested assistance in getting his doctor notes from [facility] in [place]. He was seen there 1 time while incarcerated. Contacted their office and sent a request for office note thru secure fax as requested by their secretary. Awaiting notes.   Rosanna Randy, RN   Fax # [phone] Phone: [phone]  Rosanna Randy, RN

## 2020-10-13 DIAGNOSIS — F15129 Other stimulant abuse with intoxication, unspecified: Secondary | ICD-10-CM | POA: Diagnosis not present

## 2020-10-13 DIAGNOSIS — F19959 Other psychoactive substance use, unspecified with psychoactive substance-induced psychotic disorder, unspecified: Secondary | ICD-10-CM | POA: Insufficient documentation

## 2020-10-13 DIAGNOSIS — R443 Hallucinations, unspecified: Secondary | ICD-10-CM | POA: Diagnosis not present

## 2020-10-13 DIAGNOSIS — R4182 Altered mental status, unspecified: Secondary | ICD-10-CM | POA: Diagnosis not present

## 2020-10-13 DIAGNOSIS — Z888 Allergy status to other drugs, medicaments and biological substances status: Secondary | ICD-10-CM | POA: Diagnosis not present

## 2020-10-13 DIAGNOSIS — T43625A Adverse effect of amphetamines, initial encounter: Secondary | ICD-10-CM | POA: Diagnosis not present

## 2020-10-13 DIAGNOSIS — F29 Unspecified psychosis not due to a substance or known physiological condition: Secondary | ICD-10-CM | POA: Diagnosis not present

## 2020-10-13 DIAGNOSIS — F22 Delusional disorders: Secondary | ICD-10-CM | POA: Diagnosis not present

## 2020-10-13 DIAGNOSIS — F1520 Other stimulant dependence, uncomplicated: Secondary | ICD-10-CM | POA: Insufficient documentation

## 2020-10-13 DIAGNOSIS — G4700 Insomnia, unspecified: Secondary | ICD-10-CM | POA: Diagnosis not present

## 2020-10-13 DIAGNOSIS — F419 Anxiety disorder, unspecified: Secondary | ICD-10-CM | POA: Diagnosis not present

## 2020-10-13 HISTORY — DX: Other psychoactive substance use, unspecified with psychoactive substance-induced psychotic disorder, unspecified: F19.959

## 2020-10-17 ENCOUNTER — Other Ambulatory Visit: Payer: Self-pay | Admitting: Student

## 2020-10-17 ENCOUNTER — Ambulatory Visit (INDEPENDENT_AMBULATORY_CARE_PROVIDER_SITE_OTHER): Payer: Self-pay | Admitting: Student

## 2020-10-17 ENCOUNTER — Other Ambulatory Visit (HOSPITAL_COMMUNITY): Payer: Self-pay | Admitting: Internal Medicine

## 2020-10-17 ENCOUNTER — Other Ambulatory Visit: Payer: Self-pay

## 2020-10-17 ENCOUNTER — Ambulatory Visit (HOSPITAL_COMMUNITY)
Admission: RE | Admit: 2020-10-17 | Discharge: 2020-10-17 | Disposition: A | Discharge status: Home / Self Care | Payer: Medicare Other | Source: Ambulatory Visit | Attending: Internal Medicine | Admitting: Internal Medicine

## 2020-10-17 VITALS — BP 119/64 | HR 95 | Temp 98.4°F | Ht 63.0 in | Wt 168.0 lb

## 2020-10-17 DIAGNOSIS — M25559 Pain in unspecified hip: Secondary | ICD-10-CM | POA: Insufficient documentation

## 2020-10-17 DIAGNOSIS — M25551 Pain in right hip: Secondary | ICD-10-CM | POA: Diagnosis not present

## 2020-10-17 DIAGNOSIS — G540 Brachial plexus disorders: Secondary | ICD-10-CM | POA: Diagnosis not present

## 2020-10-17 DIAGNOSIS — M25552 Pain in left hip: Secondary | ICD-10-CM | POA: Diagnosis not present

## 2020-10-17 DIAGNOSIS — F1520 Other stimulant dependence, uncomplicated: Secondary | ICD-10-CM

## 2020-10-17 DIAGNOSIS — F3181 Bipolar II disorder: Secondary | ICD-10-CM | POA: Diagnosis not present

## 2020-10-17 DIAGNOSIS — F331 Major depressive disorder, recurrent, moderate: Secondary | ICD-10-CM | POA: Diagnosis not present

## 2020-10-17 DIAGNOSIS — B20 Human immunodeficiency virus [HIV] disease: Secondary | ICD-10-CM

## 2020-10-17 DIAGNOSIS — Z23 Encounter for immunization: Secondary | ICD-10-CM

## 2020-10-17 DIAGNOSIS — Z0000 Encounter for general adult medical examination without abnormal findings: Secondary | ICD-10-CM | POA: Insufficient documentation

## 2020-10-17 DIAGNOSIS — F1990 Other psychoactive substance use, unspecified, uncomplicated: Secondary | ICD-10-CM

## 2020-10-17 MED ORDER — DULOXETINE HCL 60 MG PO CPEP: 60.0000 mg | ORAL_CAPSULE | Freq: Every day | ORAL | 2 refills | Status: DC

## 2020-10-17 MED ORDER — GABAPENTIN 300 MG PO CAPS: 300.0000 mg | ORAL_CAPSULE | Freq: Three times a day (TID) | ORAL | 2 refills | Status: DC

## 2020-10-17 MED ORDER — DULOXETINE HCL 30 MG PO CPEP: 90.0000 mg | ORAL_CAPSULE | Freq: Every day | ORAL | 2 refills | Status: DC

## 2020-10-17 NOTE — Assessment & Plan Note (Signed)
Patient reports he was diagnosed with thoracic outlet syndrome years ago and had subsequent surgery in [place]. He says since then he has had bilateral upper extremity neuropathic pain. Recently increased duloxetine while in jail. He states he continues to have some upper extremity pain that is not controlled with these medications. He is inquiring about increasing gabapentin.  A/P: Will re-fill medications, increase gabapentin given he is currently only at a low dose. Can consider other options if recent increases in medications do not offer relief. - Duloxetine 90mg  daily - Gabapentin 300mg  TID

## 2020-10-17 NOTE — Assessment & Plan Note (Signed)
-  No Tdap listed in chart, patient does not remember last Tdap. Will immunize today.  -Patient received one dose J&J COVID-19 vaccine last year. Interested in booster.

## 2020-10-17 NOTE — Patient Instructions (Addendum)
Mr. Jeziorski,   It was a pleasure seeing you today!  Today we discussed your hip pain. We are ordering x-rays today and will call you tomorrow with the results.  We also discussed your thoracic outlet syndrome and the medications you are taking. I have prescribed gabapentin as well as duloxetine.  We also administered your tetanus shot.  We look forward to seeing you next time. Please call our clinic at [phone] if you have any questions or concerns. The best time to call is Monday-Friday from 9am-4pm, but there is someone available 24/7 at the same number. If you need medication refills, please notify your pharmacy one week in advance and they will send [place] a request.  Thank you for letting us take part in your care. Wishing you the best!  Thank you, Dr. Evlyn Kanner, MD

## 2020-10-17 NOTE — Progress Notes (Signed)
   CC: establish care, hip pain  HPI:  [name] is a 40 y.o. with well-controlled HIV (undetectable, CD4 >800), thoracic outlet syndrome, current methamphetamine use, depression presenting to [facility] to establish care.  Please see problem-based list for further details, assessments, and plans.  Past Medical History:  Diagnosis Date  . 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 MRI brain and cervical spine showed bilateral neural foraminal cysts C4-C5, C5-C6, C6-C7, brain within normal limits.  - continue cymbalta, flexeril, neurontin - f/u with Dr. Debbra Hunt [facility] neurology 03/28/19  . Depression   . 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 HIV (human immunodeficiency virus infection) (HCC)   . TOS (thoracic outlet syndrome)    Review of Systems:  As per HPI  Physical Exam:  Vitals:   10/17/20 0959  BP: 119/64  Pulse: 95  Temp: 98.4 F (36.9 C)  TempSrc: Oral  SpO2: 100%  Weight: 168 lb (76.2 kg)  Height: 5\' 3"  (1.6 m)   General: Pleasant, sitting in chair, no acute distress CV: Regular rate, rhythm. No m/r/g appreciated Pulm: Clear to auscultation bilaterally. No wheezing, rhonchi, rales. MSK: Active and passive ROM normal bilateral hips. Normal bulk, tone. Psych: Appears hyperactive, normal speech, normal cognition  Assessment & Plan:   See Encounters Tab for problem based charting.  Patient discussed with Dr. 

## 2020-10-17 NOTE — Assessment & Plan Note (Addendum)
Patient reports worsening hip pain, especially over the last couple of weeks. States he has always been active and on his feet. Mentions when he was a child he was very active and danced. 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 Says he has been intermittently homeless and has had multiple bouts in jail. Due to this, he says he is often on his feet moving and rarely gets to rest adequately. Denies fevers, chills, chest pain, dyspnea, headache, vision changes.   A/P: On exam, normal ROM bilateral hips. No signs of acute inflammation or infection. Patient does currently use IV drugs, but low likelihood for seating infection without systemic signs. Patient reports worse pain towards the groin, not laterally. Therefore, less suspicious for bursitis or iliotibial pain syndrome. Will obtain XR of hips to assess for joint abnormalities. Encouraged use of Tylenol and supportive measures. - XR bilateral hips today   ADDENDUM: XR negative bilaterally. Discussed with patient, likely muscle related. Will order physical therapy. Also encouraged use of Tylenol.

## 2020-10-17 NOTE — Assessment & Plan Note (Signed)
Patient reports current methamphetamine use, states that he uses via injections. Appears to be pre-contemplative currently, similar to previous visit with ID. Will refer to CCM and behavior health for further assistance.

## 2020-10-17 NOTE — Assessment & Plan Note (Signed)
Seen by Rexene Alberts, NP with Infectious Disease. At last check in November, patient undetectable w/ CD4+ >800 on Biktarvy. Patient has appointment with ID later this week. - F/u ID this week - C/w daily Biktarvy

## 2020-10-18 ENCOUNTER — Ambulatory Visit: Payer: Self-pay

## 2020-10-18 ENCOUNTER — Other Ambulatory Visit: Payer: Self-pay | Admitting: Student

## 2020-10-18 DIAGNOSIS — M25551 Pain in right hip: Secondary | ICD-10-CM

## 2020-10-18 DIAGNOSIS — G540 Brachial plexus disorders: Secondary | ICD-10-CM

## 2020-10-18 DIAGNOSIS — F331 Major depressive disorder, recurrent, moderate: Secondary | ICD-10-CM

## 2020-10-18 NOTE — Patient Instructions (Signed)
Visit Information  PATIENT GOALS:  Goals Addressed   None     Consent to CCM Services: Kenneth Hunt was given information about Chronic Care Management services today including:  1. CCM service includes personalized support from designated clinical staff supervised by his physician, including individualized plan of care and coordination with other care providers 2. 24/7 contact phone numbers for assistance for urgent and routine care needs. 3. Service will only be billed when office clinical staff spend 20 minutes or more in a month to coordinate care. 4. Only one practitioner may furnish and bill the service in a calendar month. 5. The patient may stop CCM services at any time (effective at the end of the month) by phone call to the office staff. 6. The patient will be responsible for cost sharing (co-pay) of up to 20% of the service fee (after annual deductible is met).  Patient agreed to services and verbal consent obtained.   The patient verbalized understanding of instructions, educational materials, and care plan provided today and agreed to receive a mailed copy of patient instructions, educational materials, and care plan.   The care management team will reach out to the patient again over the next 5 days.   [name], BSW, [facility]  CLINICAL CARE PLAN: There are no care plans to display for this patient.

## 2020-10-18 NOTE — Chronic Care Management (AMB) (Signed)
  Chronic Care Management    Social Work Note  10/18/2020 Name: Kenneth Hunt MRN: [id number] DOB: [birth year]  Kenneth Hunt is a 40 y.o. year old male who is a primary care patient of Kenneth Point Callas, NP. The CCM team was consulted to assist the patient with chronic disease management and/or care coordination needs related to: Housing.   Engaged with patient Engaged with patient by telephone for initial visit in response to provider referral for social work chronic care management and care coordination services.   Consent to Services:  Patient was given the following information about Chronic Care Management services today, agreed to services, and gave verbal consent: 1. CCM service includes personalized support from designated clinical staff supervised by primary care provider, including individualized plan of care and coordination with other care providers 2. 24/7 contact phone numbers for assistance for urgent and routine care needs. 3. Service will only be billed when office clinical staff spend 20 minutes or more in a month to coordinate care. 4. Only one practitioner may furnish and bill the service in a calendar month. 5.The patient may stop CCM services at any time (effective at the end of the month) by phone call to the office staff. 6. The patient will be responsible for cost sharing (co-pay) of up to 20% of the service fee (after annual deductible is met). Patient agreed to services and consent obtained.  Patient agreed to services and consent obtained.   Assessment/Interventions: Review of patient past medical history, allergies, medications, and health status, including review of relevant consultants reports was performed today as part of a comprehensive evaluation and provision of chronic care management and care coordination services.     SDOH (Social Determinants of Health) assessments and interventions performed:    BSW contacted patient to get information from  patient about housing and jail. Patient contacted patient back and stated he was not in jail. He does have a court date on 10/31/20. If convicted he would have to go to an inpatient rehab. Patient stated he is currently staying on a family members couch. He is looking for an inpatient or outpatient substance abuse assistance that will look good before he goes to court. Patient stated he is currently going through his disability revaluation and has a lot of doctors appointments. Patient is requesting assistance with outpatient and/or inpatient SA programs.  Advanced Directives Status: Not addressed in this encounter.  CCM Care Plan  Allergies  Allergen Reactions  . Haloperidol Other (See Comments)    Tardive dyskinesia    '@THNMEDREVIEW'$ @  Patient Active Problem List   Diagnosis Date Noted  . Healthcare maintenance 10/17/2020  . Hip pain, bilateral 10/17/2020  . Urticaria 09/06/2020  . 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 Injection of illicit drug within last 12 months 08/26/2019  . Bipolar 2 disorder ([place]) 03/04/2019  . Human immunodeficiency virus (HIV) disease ([place]) 10/01/2018  . Thoracic outlet syndrome 10/01/2018  . 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 Moderate recurrent major depression ([place]) 12/30/2017  . History of syphilis 12/30/2017    Conditions to be addressed/monitored per PCP order: Housing; Housing barriers  There are no care plans to display for this patient.    Follow Up Plan: SW will follow up with patient by phone over the next 5 days      [name], [place] Medicaid Team

## 2020-10-19 NOTE — Progress Notes (Signed)
Internal Medicine Clinic Attending ? ?Case discussed with Dr. Braswell  At the time of the visit.  We reviewed the resident?s history and exam and pertinent patient test results.  I agree with the assessment, diagnosis, and plan of care documented in the resident?s note.  ?

## 2020-10-21 ENCOUNTER — Other Ambulatory Visit: Payer: Self-pay

## 2020-10-21 ENCOUNTER — Ambulatory Visit: Payer: Medicare Other | Admitting: Infectious Diseases

## 2020-10-21 ENCOUNTER — Ambulatory Visit: Payer: Self-pay

## 2020-10-21 ENCOUNTER — Encounter: Payer: Self-pay | Admitting: Infectious Diseases

## 2020-10-21 DIAGNOSIS — F331 Major depressive disorder, recurrent, moderate: Secondary | ICD-10-CM | POA: Diagnosis not present

## 2020-10-21 DIAGNOSIS — B20 Human immunodeficiency virus [HIV] disease: Secondary | ICD-10-CM

## 2020-10-21 DIAGNOSIS — G540 Brachial plexus disorders: Secondary | ICD-10-CM

## 2020-10-21 DIAGNOSIS — F1520 Other stimulant dependence, uncomplicated: Secondary | ICD-10-CM | POA: Diagnosis not present

## 2020-10-21 DIAGNOSIS — M25551 Pain in right hip: Secondary | ICD-10-CM

## 2020-10-21 MED ORDER — BIKTARVY 50-200-25 MG PO TABS: 1.0000 | ORAL_TABLET | Freq: Every day | ORAL | 5 refills | Status: DC

## 2020-10-21 NOTE — Patient Instructions (Addendum)
Visit Information  Thank you for speaking with me today Kenneth Hunt,   Per your request I will be sending the resources to your email [email or web address].  Resources for outpatient assistance:  [facility] : [address] [place], [place], [place] [address], [phone]  [facility]: [address], [place], [place] [address], [phone]  [facility]: [address], [place], [place] [address], [phone].    PATIENT GOALS: Goals Addressed   None     Print copy of patient instructions, educational materials, and care plan provided in person.  The care management team will reach out to the patient again over the next 5 days.   Gus Puma, BSW, [facility]  [facility]  Internal Medicine Center

## 2020-10-21 NOTE — Chronic Care Management (AMB) (Signed)
Chronic Care Management    Social Work Note  10/21/2020 Name: Kenneth Hunt MRN: [id number] DOB: 05-18-1981  Kenneth Hunt is a 40 y.o. year old male who is a primary care patient of Sanjuan Dame, MD. The CCM team was consulted to assist the patient with chronic disease management and/or care coordination needs related to: substance abuse assistance .   Engaged with patient Engaged with patient by telephone for follow up visit in response to provider referral for social work chronic care management and care coordination services.   Consent to Services:  Patient was given the following information about Chronic Care Management services today, agreed to services, and gave verbal consent: 1. CCM service includes personalized support from designated clinical staff supervised by primary care provider, including individualized plan of care and coordination with other care providers 2. 24/7 contact phone numbers for assistance for urgent and routine care needs. 3. Service will only be billed when office clinical staff spend 20 minutes or more in a month to coordinate care. 4. Only one practitioner may furnish and bill the service in a calendar month. 5.The patient may stop CCM services at any time (effective at the end of the month) by phone call to the office staff. 6. The patient will be responsible for cost sharing (co-pay) of up to 20% of the service fee (after annual deductible is met). Patient agreed to services and consent obtained.  Patient agreed to services and consent obtained.   Assessment/Interventions: Review of patient past medical history, allergies, medications, and health status, including review of relevant consultants reports was performed today as part of a comprehensive evaluation and provision of chronic care management and care coordination services.     SDOH (Social Determinants of Health) assessments and interventions performed:    BSW contacted the [facility] they do accept [place] Medicare and has an IOP for patients. BSW called [facility] [phone] they only offer outpatient services and accept patients insurance. BSW contacted [facility] [phone] they are an inpatient facility, but do not accept medicaid and medicare.  BSW contacted patient to inform him of the information. Patient asked for resources to be emailed to him at [email or web address]. Patient asked if BSW could contact his public defender and speak with her, so she would know they he is really trying to be proactive before court on 10/31/20. BSW contacted public defender Tilden Fossa [phone] and left a voicemail. Advanced Directives Status: Not addressed in this encounter.  CCM Care Plan  Allergies  Allergen Reactions  . Haloperidol Other (See Comments)    Tardive dyskinesia    _1 @  Patient Active Problem List   Diagnosis Date Noted  . Healthcare maintenance 10/17/2020  . Hip pain, bilateral 10/17/2020  . Urticaria 09/06/2020  . 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 Injection of illicit drug within last 12 months 08/26/2019  . Bipolar 2 disorder ([place]) 03/04/2019  . Human immunodeficiency virus (HIV) disease ([place]) 10/01/2018  . Thoracic outlet syndrome 10/01/2018  . 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 Moderate recurrent major depression ([place]) 12/30/2017  . History of syphilis 12/30/2017    Conditions to be addressed/monitored per PCP order: substance abuse; Substance abuse issues -  resources  There are no care plans to display for this patient.    Follow Up Plan: SW will follow up with patient by phone over the next [place] [place], [place]  [facility]  High Risk Managed Medicaid Team

## 2020-10-21 NOTE — Progress Notes (Signed)
Name: Kenneth Hunt  DOB: [birth year] MRN: [id number] PCP: Sanjuan Dame, MD     Brief Narrative:  Kenneth Hunt is a 40 y.o. male with well controlled HIV. Dx .  Transferred care from [facility] in [place]  [phone])  VL < 20 recently with CD4 ~340 on Biktarvy  HIV Risk: MSM History of OIs: none known  Intake Labs 07/23/2019: Hep B sAg (-), sAb (-), cAb (-); Hep A (immune), Hep C (-) Quantiferon (-) HLA B*5701 (-) G6PD: () Toxo IgG: (-)   Previous Regimens: . Biktarvy   Genotypes: . None on record   Subjective:   Chief Complaint  Patient presents with  . Follow-up    Recently released from jail about 3 weeks ago; has court case pending; declined condoms;      HPI: Here today for follow up HIV care. In jail recently again - court cases coming up soon.  Got a PCP recently and saw him recently.   Main thing for him lately is hip pain. Pelvic xray was negative. Feels that his "muscle in the right hip punched out" almost like it popped out of socket. He starts with physical therapy on 3/15 to work on things to get stronger.   Talking a lot about his history of drug use and his past. Disability up for re-evaluation. Working with a Tourist information centre manager at [employer]. Increased SSRI in prison recently.    Review of Systems  Constitutional: Negative for chills and fever.  HENT: Negative for sore throat.   Eyes: Negative for visual disturbance.  Gastrointestinal: Negative for abdominal pain, anal bleeding and rectal pain.  Genitourinary: Negative for dysuria, genital sores, penile discharge, penile pain, scrotal swelling and testicular pain.  Musculoskeletal: Negative for arthralgias and joint swelling.  Skin: Negative for rash.  Neurological: Negative for headaches.  Hematological: Negative for adenopathy.  Psychiatric/Behavioral: Positive for dysphoric mood.    Past Medical History:  Diagnosis Date  . 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 MRI brain and cervical spine showed bilateral neural foraminal cysts C4-C5, C5-C6, C6-C7, brain within normal limits.  - continue cymbalta, flexeril, neurontin - f/u with Dr. Elana Alm [facility] neurology 03/28/19  . Depression   . 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 HIV (human immunodeficiency virus infection) ([place])   . TOS (thoracic outlet syndrome)     Outpatient Medications Prior to Visit  Medication Sig Dispense Refill  . DULoxetine (CYMBALTA) 30 MG capsule Take 3 capsules (90 mg total) by mouth daily. 90 capsule 2  . gabapentin (NEURONTIN) 300 MG capsule Take 1 capsule (300 mg total) by mouth 3 (three) times daily. 90 capsule 2  . bictegravir-emtricitabine-tenofovir AF (BIKTARVY) 50-200-25 MG TABS tablet Take 1 tablet by mouth daily. 30 tablet 0  . acetaminophen (TYLENOL) 500 MG tablet Take 1 tablet (500 mg total) by mouth every 6 (six) hours as needed. 30 tablet 0  . hydrOXYzine (ATARAX/VISTARIL) 25 MG tablet Take 1 tablet (25 mg total) by mouth 3 (three) times daily as needed. (Patient not taking: Reported on 10/21/2020) 30 tablet 0  . triamcinolone ointment (KENALOG) 0.5 % Apply 1 application topically 2 (two) times daily. 30 g 0  . predniSONE (STERAPRED UNI-PAK 21 TAB) 10 MG (21) TBPK tablet On day 1 and 2 take 4 pills, 3 and 4 take 3 pills, day 5 and 6 take 2 pills, day 7-8-9 take 1 pill then stop (Patient not taking:  Reported on 10/21/2020) 21 tablet 0   No facility-administered medications prior to visit.     Allergies  Allergen Reactions  . Haloperidol Other (See Comments)    Tardive dyskinesia    Social History   Tobacco Use  . Smoking status: Never Smoker  . Smokeless tobacco: Never Used  Vaping Use  . Vaping Use: Never used  Substance Use Topics  . Alcohol use: Not Currently  . Drug use: Yes    Types: IV, Methamphetamines    Comment: last used last nght    Social History   Substance  and Sexual Activity  Sexual Activity Yes  . Partners: Male   Comment: declined condoms; 1/21     Objective:   Vitals:   10/21/20 0910  BP: 101/68  Pulse: 89  Weight: 168 lb 3.2 oz (76.3 kg)   Body mass index is 29.8 kg/m.  Physical Exam HENT:     Mouth/Throat:     Mouth: No oral lesions.     Dentition: Normal dentition. No dental caries.  Eyes:     General: No scleral icterus. Cardiovascular:     Rate and Rhythm: Normal rate and regular rhythm.     Heart sounds: Normal heart sounds.  Pulmonary:     Effort: Pulmonary effort is normal.     Breath sounds: Normal breath sounds.  Abdominal:     General: There is no distension.     Palpations: Abdomen is soft.     Tenderness: There is no abdominal tenderness.  Lymphadenopathy:     Cervical: No cervical adenopathy.  Skin:    General: Skin is warm and dry.     Findings: No rash.  Neurological:     Mental Status: He is alert and oriented to person, place, and time.     Lab Results Lab Results  Component Value Date   WBC 13.7 (H) 03/05/2020   HGB 14.3 03/05/2020   HCT 42.5 03/05/2020   MCV 91.8 03/05/2020   PLT 237 03/05/2020    Lab Results  Component Value Date   CREATININE 1.68 (H) 03/05/2020   BUN 20 03/05/2020   NA 143 03/05/2020   K 3.9 03/05/2020   CL 109 03/05/2020   CO2 20 (L) 03/05/2020    Lab Results  Component Value Date   ALT 15 03/05/2020   AST 30 03/05/2020   ALKPHOS 66 03/05/2020   BILITOT 0.8 03/05/2020    Lab Results  Component Value Date   CHOL 155 07/23/2019   HDL 55 07/23/2019   LDLCALC 85 07/23/2019   TRIG 60 07/23/2019   CHOLHDL 2.8 07/23/2019   HIV 1 RNA Quant  Date Value  06/27/2020 <20 Copies/mL (H)  04/06/2020 <20 Copies/mL (H)  12/17/2019 <20 NOT DETECTED copies/mL   CD4 T Cell Abs (/uL)  Date Value  06/27/2020 859  04/06/2020 689  12/17/2019 710     Assessment & Plan:   Problem List Items Addressed This Visit      High   Human immunodeficiency virus  (HIV) disease ([place]) (Chronic)    Well controlled. Records reviewed from recent visit to [facility] ID clinic while incarcerated - VL < 20 and CD4 within normal ranges for him. Will defer labs today and update in 3-4 months.  Has access to his biktarvy and no problems with s/e. Will continue this treatment for him.  His other medical conditions, behavioral health  / substance abuse problems are certainly more of a priority for him currently.  Return  in about 3 months (around 01/21/2021).       Relevant Medications   bictegravir-emtricitabine-tenofovir AF (BIKTARVY) 50-200-25 MG TABS tablet     Medium   Methamphetamine dependence ([place])    He would like to get into an inpatient rehab plan - we reviewed a few options. I gave him the number to call for his insurance carrier to see about what is available to him locally.  Outpatient options provided as well with [facility], [place].  Stressed urgency of him needing to quit for his overall health and well-being.         Unprioritized   Moderate recurrent major depression ([place])    Unclear if major depression alone vs bipolar. I told him it is hard to tell with confounding mood altering substance use ongoing.  Referral to psychiatry per Dr. Collene Gobble is pending at this time.          Janene Madeira, MSN, NP-C [facility] for Infectious [place] Pager: [phone] Office: [phone]  10/24/20  9:10 AM

## 2020-10-21 NOTE — Patient Instructions (Addendum)
   I want you to work on addiction treatment -  [facility], [address], [place], [place] [address], [phone]  [facility], [address] [place], [place], [place] [address], [phone]  [facility] Recovery [phone] [address] Donella Stade [place] [address] [phone]  Call you insurance carrier and ask them what your options are with regards to inpatient rehab and recovery for addiction.    We have a counselor here I would like for you to start seeing her now. Dr. Marijo Conception is working on getting you in to see psychiatrist - call him if you don't hear anything about an appointment by next week.

## 2020-10-24 ENCOUNTER — Telehealth: Payer: Self-pay | Admitting: Student

## 2020-10-24 ENCOUNTER — Encounter: Payer: Self-pay | Admitting: Student

## 2020-10-24 NOTE — Telephone Encounter (Signed)
Pt seen on 10/17/2020 and is requesting an additional referral to a Thoracic Outlet Syndrome specialists.  Please advise

## 2020-10-24 NOTE — Assessment & Plan Note (Signed)
Well controlled. Records reviewed from recent visit to [facility] ID clinic while incarcerated - VL < 20 and CD4 within normal ranges for him. Will defer labs today and update in 3-4 months.  Has access to his biktarvy and no problems with s/e. Will continue this treatment for him.  His other medical conditions, behavioral health  / substance abuse problems are certainly more of a priority for him currently.  Return in about 3 months (around 01/21/2021).

## 2020-10-24 NOTE — Assessment & Plan Note (Signed)
Unclear if major depression alone vs bipolar. I told him it is hard to tell with confounding mood altering substance use ongoing.  Referral to psychiatry per Dr. Marijo Conception is pending at this time.

## 2020-10-24 NOTE — Assessment & Plan Note (Signed)
He would like to get into an inpatient rehab plan - we reviewed a few options. I gave him the number to call for his insurance carrier to see about what is available to him locally.  Outpatient options provided as well with [facility], [facility].  Stressed urgency of him needing to quit for his overall health and well-being.

## 2020-10-24 NOTE — Telephone Encounter (Signed)
Hey! Would a referral be covered by his insurance?

## 2020-10-25 ENCOUNTER — Encounter: Payer: Self-pay | Admitting: Physical Therapy

## 2020-10-25 ENCOUNTER — Ambulatory Visit: Payer: Medicare Other | Attending: Internal Medicine | Admitting: Physical Therapy

## 2020-10-25 ENCOUNTER — Other Ambulatory Visit: Payer: Self-pay

## 2020-10-25 DIAGNOSIS — M25551 Pain in right hip: Secondary | ICD-10-CM | POA: Insufficient documentation

## 2020-10-25 DIAGNOSIS — M25552 Pain in left hip: Secondary | ICD-10-CM | POA: Insufficient documentation

## 2020-10-25 DIAGNOSIS — M6281 Muscle weakness (generalized): Secondary | ICD-10-CM | POA: Insufficient documentation

## 2020-10-25 NOTE — Telephone Encounter (Signed)
I'm not quite sure who I would refer him to, honestly! He told me he's had surgery on this prior. I would like for him to work with physical therapy first before we refer him to someone else. Thanks!

## 2020-10-25 NOTE — Telephone Encounter (Signed)
He does have active [employer] Ins.  Pt states he is already sch with PT to be seen as requested but, he is asking for an additional referral.   Please advise if you will be placing this Referral per his request.

## 2020-10-25 NOTE — Therapy (Signed)
[facility] [address] [place], [place], [address] Phone: [phone]   Fax:  [phone]  Physical Therapy Evaluation  Patient Details  Name: Kenneth Hunt MRN: [id number] Date of Birth: 30-Apr-1981 Referring Provider (PT): Earl Lagos, MD   Encounter Date: 10/25/2020   PT End of Session - 10/25/20 1656    Visit Number 1    Number of Visits 7    Date for PT Re-Evaluation 12/20/20    Authorization Type MCR: kx mod 15th visit Capture FOTO at 2nd visit, FOT 7th and 11 visit.    PT Start Time 1546    PT Stop Time 1627    PT Time Calculation (min) 41 min    Activity Tolerance Patient tolerated treatment well    Behavior During Therapy WFL for tasks assessed/performed           Past Medical History:  Diagnosis Date  . Anxiety   . 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 MRI brain and cervical spine showed bilateral neural foraminal cysts C4-C5, C5-C6, C6-C7, brain within normal limits.  - continue cymbalta, flexeril, neurontin - f/u with Dr. Debbra Riding [facility] neurology 03/28/19  . Depression   . 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 HIV (human immunodeficiency virus infection) (HCC)   . TOS (thoracic outlet syndrome)     Past Surgical History:  Procedure Laterality Date  . tos surgery      There were no vitals filed for this visit.    Subjective Assessment - 10/25/20 1550    Subjective pt is a 40 y.o M with CC of bil hip pain with no specific MOI reporting he was brutal on his hips with prolonged standing, increased activity between karate, prolonged walking with an in increased cadence/ stride. He reprots sudden occurrence of popping in the R hip that occured a number of times. He reports with being incarcerated doing alot of pacing increased this issue. pain is in both hips pwith R>L in the front of the hip/    How long can you sit  comfortably? unlimited    How long can you stand comfortably? unlimited    Diagnostic tests 3/7/ 2022 xray bil   IMPRESSION:  No significant radiographic abnormality involving either hip.    Patient Stated Goals to decrease pain    Currently in Pain? Yes    Pain Score 6    at worst 8/10   Pain Location Hip    Pain Orientation Right;Left   R >L   Pain Descriptors / Indicators Aching;Throbbing;Sharp;Dull    Pain Type Chronic pain    Pain Frequency Intermittent    Aggravating Factors  pivoting, walking    Pain Relieving Factors sit              [facility] PT Assessment - 10/25/20 0001      Assessment   Medical Diagnosis Acute hip pain, bilateral    Referring Provider (PT) Earl Lagos, MD    Onset Date/Surgical Date --   for many years   Hand Dominance Right    Next MD Visit --   call back in may   Prior Therapy TOS      Precautions   Precautions None      Restrictions   Weight Bearing Restrictions No      Balance Screen   Has the patient fallen in the past 6 months No      Home  Environment   Living Environment Shelter/Homeless   adult day center     ROM / Strength   AROM / PROM / Strength AROM;Strength      AROM   Overall AROM  Within functional limits for tasks performed      Strength   Strength Assessment Site Hip    Right/Left Hip Right;Left    Right Hip Flexion 4+/5   produced concordant symptoms   Right Hip Extension 4/5    Right Hip ABduction 4-/5    Left Hip Flexion 4+/5    Left Hip Extension 4-/5    Left Hip ABduction 4-/5    Left Hip ADduction 4+/5      Palpation   Palpation comment TTP along the R TFL and iliopsoas on the R, with multiple trigger points noted      Special Tests    Special Tests Hip Special Tests    Hip Special Tests  Hip Scouring;[employer] Test    Findings Positive    Side Right      Hip Scouring   Findings Negative      Ambulation/Gait   Gait Pattern Within Functional Limits                       Objective measurements completed on examination: See above findings.               PT Education - 10/25/20 1657    Education Details evaluation findings, PCO, goals, HEP with proper form, anatomy of the area involved.    Person(s) Educated Patient    Methods Explanation;Verbal cues;Handout    Comprehension Verbalized understanding;Verbal cues required            PT Short Term Goals - 10/25/20 1721      PT SHORT TERM GOAL #1   Title pt to be IND with inital HEP    Time 3    Period Weeks    Status New    Target Date 11/15/20      PT SHORT TERM GOAL #2   Title capture FOTO on second visit and update goals.    Time 1    Period Weeks    Status New    Target Date 11/01/20             PT Long Term Goals - 10/25/20 1721      PT LONG TERM GOAL #1   Title increase gross R hip strength to >/=4+/5 to promote hip stability to reduce popping with gait    Time 6    Status New    Target Date 12/06/20      PT LONG TERM GOAL #2   Title pt to be able to stand and walk for >/= 2 hours with no report of popping and max pain of </= 2/10    Time 6    Period Weeks    Status New    Target Date 12/06/20      PT LONG TERM GOAL #3   Title pt to be IND with all HEP and is able to maintain and progress current LOF IND    Time 6    Period Weeks    Status New    Target Date 12/06/20                  Plan - 10/25/20 1627    Clinical Impression Statement pt presents to OPPT with CC of bil  hip pain with R>L that has been going on for over a year now. He has functional hip ROM with weakness bil with reproduction of symptoms with resisted hip flexion on the R. TTP along the R TFL and iliopsoas on the R, with multiple trigger points noted. reviewed anatomy of the area involved and beneftis of treatment. He would benefit from physical therapy to reduce R hip pain, improve gross hip strengthening, and maximize his function by addressing the deficitis  listed.    Personal Factors and Comorbidities Comorbidity 2;Time since onset of injury/illness/exacerbation    Comorbidities hx of depression, anxiety,    Examination-Activity Limitations Locomotion Level    Stability/Clinical Decision Making Evolving/Moderate complexity    Clinical Decision Making Moderate    Rehab Potential Good    PT Frequency 1x / week    PT Duration 6 weeks    PT Treatment/Interventions ADLs/Self Care Home Management;Electrical Stimulation;Cryotherapy;Iontophoresis 4mg /ml Dexamethasone;Moist Heat;Traction;Ultrasound;Gait training;Stair training;Functional mobility training;Therapeutic activities;Therapeutic exercise;Balance training;Neuromuscular re-education;Manual techniques;Dry needling;Passive range of motion;Taping;Patient/family education    PT Next Visit Plan review / update HEP PRN, CAPTURE FOTO,  STW for hip flexors/ TFL, gross hip strengthening emphasis on hip flexor, abduction/ extension, assess gait    PT Home Exercise Plan [id number] -    Consulted and Agree with Plan of Care Patient           Patient will benefit from skilled therapeutic intervention in order to improve the following deficits and impairments:  Improper body mechanics,Decreased strength,Increased muscle spasms,Postural dysfunction,Pain,Decreased activity tolerance,Decreased endurance  Visit Diagnosis: Pain in right hip  Pain in left hip  Muscle weakness (generalized)     Problem List Patient Active Problem List   Diagnosis Date Noted  . Healthcare maintenance 10/17/2020  . Hip pain, bilateral 10/17/2020  . 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 Injection of illicit drug within last 12 months 08/26/2019  . Bipolar 2 disorder (HCC) 03/04/2019  . Human immunodeficiency virus (HIV) disease (HCC) 10/01/2018  . Thoracic outlet syndrome 10/01/2018  . 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 Methamphetamine dependence (HCC) 12/30/2017  . Moderate recurrent major  depression (HCC) 12/30/2017  . History of syphilis 12/30/2017   01/01/2018 PT, DPT, LAT, ATC  10/25/20  5:27 PM      [facility] [address] [place], [place], [place] Phone: [phone]   Fax:  [phone]  Name: Kenneth Hunt MRN: Gwynn Burly Date of Birth: 1981-01-06

## 2020-10-25 NOTE — Telephone Encounter (Signed)
No Problem.  I will give him a call.  Thanks for your help!!

## 2020-10-26 DIAGNOSIS — F1520 Other stimulant dependence, uncomplicated: Secondary | ICD-10-CM | POA: Diagnosis not present

## 2020-11-05 ENCOUNTER — Ambulatory Visit: Payer: Medicare Other | Admitting: Rehabilitative and Restorative Service Providers"

## 2020-11-07 ENCOUNTER — Telehealth: Payer: Self-pay

## 2020-11-07 NOTE — Telephone Encounter (Signed)
Patient called back on friend's phone stating he lost his phone and would like to come in person to tomorrow's appt with Dr. Monna Fam.  Also, states when he lived in [place] he was prescribed Vivitrol for crystal meth addiction and monthly injections of Aristada as an antipsychotic. He is wondering if he can be restarted on these meds. He will complete a ROI for his previous doctor in [place] tomorrow when he comes for appt.

## 2020-11-07 NOTE — Telephone Encounter (Signed)
Naltrexone is not controlled and would not require a DEA number with an X wavier. However we do not have the Vivitrol formulation (injection suspended release) Behavioral health at [facility] likely would.

## 2020-11-07 NOTE — Telephone Encounter (Signed)
He will have to see a psychiatrist for the Aristada injections, which I believe he can talk to Dr. Monna Fam about. Since we are doing away with specialty clinics, will he need an appointment with an attending who can prescribe the Vivitrol?

## 2020-11-07 NOTE — Telephone Encounter (Signed)
Pt is requesting a call back about being placed on a medicine  He was on before

## 2020-11-08 ENCOUNTER — Ambulatory Visit: Payer: Medicare Other | Admitting: Behavioral Health

## 2020-11-08 DIAGNOSIS — F331 Major depressive disorder, recurrent, moderate: Secondary | ICD-10-CM

## 2020-11-08 DIAGNOSIS — F3181 Bipolar II disorder: Secondary | ICD-10-CM

## 2020-11-08 NOTE — BH Specialist Note (Signed)
Integrated Behavioral Health Initial In-Person Visit  MRN: [id number] Name: Kenneth Hunt  Number of Integrated Behavioral Health Clinician visits:: 1/6 Session Start time: 1:00pm  Session End time: 2:00pm Total time: 60 minutes  Types of Service: Individual psychotherapy  Interpretor:No. Interpretor Name and Language: n/a   Warm Hand Off Completed.       Subjective: Kenneth Hunt is a 40 y.o. male accompanied by self Patient was referred by Dr. Evlyn Kanner, MD for mental health issues & medication needs (how to f/u on this).Pt has attend drug addiction program @ [facility] for approximately 2 wks now. He attends MWF. He has not started [place] sessions w/a Therapist there due to scheduling availability out past 3 mos. Directed Pt to f/u on medication needs w/Dr. Ezzard Flax, MD; Psychiatrist through [facility]. Pt anxious to re-start the medications he was taking in [place] through [facility] bc the drug protocol he was fllwg was helpful. 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 Pt has Hx of In Pt Tx in [place] for 2 wks. Pt has Hx of visual/auditory hallucinations. 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 Pt is connected to the [facility] for support due to HIV; his T cell counts have been low-undetectable. Pt has had, "a long medical journey." He has also been in & out invld w/the Legal [employer]. He is worried for, "catching a felony" off patterned beh in which he walks into ppl's homes. His jail terms have been for B & E (although he never steals), & trespassing. He also has Hx of indecent exposures.   Pt is currently under review for his Disability. He recently lost his ID & Pt does not drive due to no car since re-possessed in 11/26/2012. Duration of problem: years now - since late Nov 26, 2016; Severity of problem: moderate to severe  Objective: Mood: Anxious and  Depressed and Affect: congruent w/affect Risk of harm to self or others: No plan to harm self or others  Life Context: Family and Social: Pt has one Production manager & three other Strs ranging in age from 42yo-42yo.  Pt has no family locally. His Parents died in Nov 27, 2007 & 2010-11-27. Mother had Hx of paranoid Schizophrenia & Tardive Dyskinesia. Family unsupportive when Pt revealed his homosexuality. Family generally seem unsupportive of his lifestyle choices. Pt lives w/a friend who also house another man & his GF. Man drinks & argues w/lhis GF & Pt is unable to get away bc he sleeps on the sofa. The arguing is a big trigger, as his Parents fought a lot in the home during childhood. 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 School/Work: Pt is unemployed & does not attend school. Self-Care: Pt is struggling to get himself back on track. Life Changes: Pt has moved around since Sr. In H Sch. He had 2 LT relationships. A 10 yr Partner & a 4 yr Partner. He is currently Single w/no interest in a relationship.  Pt cared for both Parents when they died. 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   Pt has multiple health status issues.  Pt requesting medications to treat his dep/anx & addiction issues. Directed Pt to [facility] for Psychiatrist Consult w/Dr. Ezzard Flax, MD. Pt has attended Rehab 3 times for addiction issues. He is currently doing PT for  his upper body issues.   Pt given Referral to [facility] for his housing needs.  Patient and/or Family's Strengths/Protective Factors: Social connections, Concrete supports in place (healthy food, safe environments, etc.) and Physical Health (exercise, healthy diet, medication compliance, etc.)  Goals Addressed: Patient will: 1. Reduce symptoms of: anxiety, depression, stress and impulsivity as it relates to dec-mkg that lands him in jail 2. Increase knowledge and/or ability of: coping skills, healthy habits,  self-management skills, stress reduction and Recovery Support/Relapse Prevention  3. Demonstrate ability to: Increase healthy adjustment to current life circumstances, Increase adequate support systems for patient/family, Decrease self-medicating behaviors and Begin healthy grieving over loss  Progress towards Goals: Estb'd today; requests for consistent psychotherapy w/eventual Referral out into the Community.  Interventions: Interventions utilized: Intake/Assessment   Standardized Assessments completed: f/u with necessary screeners  Patient and/or Family Response: Pt receptive to session today & presents anxious & distressed over his circumstances.    Patient Centered Plan: Patient is on the following Treatment Plan(s):  Psychotherapy bimonthly due to mental health Provider backlog in [place]. Pt will f/u on suggestions & report back to Clinician.  Assessment: Patient currently experiencing stress overload & psychological overwhelm. Gave Pt concrete directions on how to manage his medical & mental healthcare today.    Patient may benefit from cont'd psychotherapy to assess progress.  Plan: 1. Follow up with behavioral health clinician on : 2-3 wks in person for 60 min 2. Behavioral recommendations: Call [facility] & get appt w/Dr. Ezzard Flax, MD for medication needs. Cont your program @ [facility] & be consistent in your attendance. Call [facility] & secure improved living cond for yourself.  Seek a NA or AA Support Group. 3. Referral(s): Community Resources:  Housing and Addiction Tx for meds 4. "From scale of 1-10, how likely are you to follow plan?": 5  Deneise Lever, LMFT

## 2020-11-09 DIAGNOSIS — F1520 Other stimulant dependence, uncomplicated: Secondary | ICD-10-CM | POA: Diagnosis not present

## 2020-11-12 ENCOUNTER — Ambulatory Visit: Payer: Medicare Other | Attending: Internal Medicine | Admitting: Physical Therapy

## 2020-11-12 ENCOUNTER — Emergency Department (HOSPITAL_COMMUNITY)
Admission: EM | Admit: 2020-11-12 | Discharge: 2020-11-13 | Disposition: A | Discharge status: Home / Self Care | Payer: Medicare Other | Attending: Emergency Medicine | Admitting: Emergency Medicine

## 2020-11-12 ENCOUNTER — Ambulatory Visit (INDEPENDENT_AMBULATORY_CARE_PROVIDER_SITE_OTHER)
Admission: EM | Admit: 2020-11-12 | Discharge: 2020-11-12 | Disposition: A | Discharge status: Other Acute Inpatient Hospital | Payer: Medicare Other | Source: Home / Self Care

## 2020-11-12 ENCOUNTER — Encounter (HOSPITAL_COMMUNITY): Payer: Self-pay

## 2020-11-12 ENCOUNTER — Other Ambulatory Visit: Payer: Self-pay

## 2020-11-12 ENCOUNTER — Telehealth: Payer: Self-pay | Admitting: Physical Therapy

## 2020-11-12 DIAGNOSIS — R000 Tachycardia, unspecified: Secondary | ICD-10-CM | POA: Insufficient documentation

## 2020-11-12 DIAGNOSIS — R442 Other hallucinations: Secondary | ICD-10-CM | POA: Diagnosis not present

## 2020-11-12 DIAGNOSIS — F19951 Other psychoactive substance use, unspecified with psychoactive substance-induced psychotic disorder with hallucinations: Secondary | ICD-10-CM | POA: Diagnosis not present

## 2020-11-12 DIAGNOSIS — M6281 Muscle weakness (generalized): Secondary | ICD-10-CM | POA: Insufficient documentation

## 2020-11-12 DIAGNOSIS — M25551 Pain in right hip: Secondary | ICD-10-CM | POA: Insufficient documentation

## 2020-11-12 DIAGNOSIS — F19959 Other psychoactive substance use, unspecified with psychoactive substance-induced psychotic disorder, unspecified: Secondary | ICD-10-CM

## 2020-11-12 DIAGNOSIS — Z21 Asymptomatic human immunodeficiency virus [HIV] infection status: Secondary | ICD-10-CM | POA: Diagnosis not present

## 2020-11-12 DIAGNOSIS — F15950 Other stimulant use, unspecified with stimulant-induced psychotic disorder with delusions: Secondary | ICD-10-CM | POA: Insufficient documentation

## 2020-11-12 DIAGNOSIS — R0902 Hypoxemia: Secondary | ICD-10-CM | POA: Diagnosis not present

## 2020-11-12 DIAGNOSIS — R4182 Altered mental status, unspecified: Secondary | ICD-10-CM | POA: Diagnosis present

## 2020-11-12 DIAGNOSIS — F15920 Other stimulant use, unspecified with intoxication, uncomplicated: Secondary | ICD-10-CM | POA: Insufficient documentation

## 2020-11-12 DIAGNOSIS — R0689 Other abnormalities of breathing: Secondary | ICD-10-CM | POA: Diagnosis not present

## 2020-11-12 DIAGNOSIS — F15929 Other stimulant use, unspecified with intoxication, unspecified: Secondary | ICD-10-CM

## 2020-11-12 DIAGNOSIS — F1510 Other stimulant abuse, uncomplicated: Secondary | ICD-10-CM | POA: Insufficient documentation

## 2020-11-12 DIAGNOSIS — R404 Transient alteration of awareness: Secondary | ICD-10-CM | POA: Diagnosis not present

## 2020-11-12 DIAGNOSIS — M25552 Pain in left hip: Secondary | ICD-10-CM | POA: Insufficient documentation

## 2020-11-12 LAB — CBG MONITORING, ED: Glucose-Capillary: 121 mg/dL — ABNORMAL HIGH (ref 70–99)

## 2020-11-12 LAB — COMPREHENSIVE METABOLIC PANEL
ALT: 32 U/L (ref 0–44)
AST: 32 U/L (ref 15–41)
Albumin: 4.1 g/dL (ref 3.5–5.0)
Alkaline Phosphatase: 67 U/L (ref 38–126)
Anion gap: 9 (ref 5–15)
BUN: 16 mg/dL (ref 6–20)
CO2: 23 mmol/L (ref 22–32)
Calcium: 9.2 mg/dL (ref 8.9–10.3)
Chloride: 107 mmol/L (ref 98–111)
Creatinine, Ser: 1.07 mg/dL (ref 0.61–1.24)
GFR, Estimated: >60 mL/min (ref >60)
Glucose, Bld: 124 mg/dL — ABNORMAL HIGH (ref 70–99)
Potassium: 5.4 mmol/L — ABNORMAL HIGH (ref 3.5–5.1)
Sodium: 139 mmol/L (ref 135–145)
Total Bilirubin: 0.9 mg/dL (ref 0.3–1.2)
Total Protein: 7.2 g/dL (ref 6.5–8.1)

## 2020-11-12 LAB — CBC WITH DIFFERENTIAL/PLATELET
Abs Immature Granulocytes: 0.07 10*3/uL (ref 0.00–0.07)
Basophils Absolute: 0 10*3/uL (ref 0.0–0.1)
Basophils Relative: 0 %
Eosinophils Absolute: 0 10*3/uL (ref 0.0–0.5)
Eosinophils Relative: 0 %
HCT: 43.3 % (ref 39.0–52.0)
Hemoglobin: 14.5 g/dL (ref 13.0–17.0)
Immature Granulocytes: 1 %
Lymphocytes Relative: 5 %
Lymphs Abs: 0.7 10*3/uL (ref 0.7–4.0)
MCH: 31.3 pg (ref 26.0–34.0)
MCHC: 33.5 g/dL (ref 30.0–36.0)
MCV: 93.5 fL (ref 80.0–100.0)
Monocytes Absolute: 0.4 10*3/uL (ref 0.1–1.0)
Monocytes Relative: 3 %
Neutro Abs: 13.4 10*3/uL — ABNORMAL HIGH (ref 1.7–7.7)
Neutrophils Relative %: 91 %
Platelets: 240 10*3/uL (ref 150–400)
RBC: 4.63 MIL/uL (ref 4.22–5.81)
RDW: 12.3 % (ref 11.5–15.5)
WBC: 14.6 10*3/uL — ABNORMAL HIGH (ref 4.0–10.5)
nRBC: 0 % (ref 0.0–0.2)

## 2020-11-12 LAB — ETHANOL: Alcohol, Ethyl (B): <10 mg/dL (ref <10)

## 2020-11-12 LAB — ACETAMINOPHEN LEVEL: Acetaminophen (Tylenol), Serum: <10 ug/mL — ABNORMAL LOW (ref 10–30)

## 2020-11-12 LAB — CK: Total CK: 185 U/L (ref 49–397)

## 2020-11-12 LAB — SALICYLATE LEVEL: Salicylate Lvl: <7 mg/dL — ABNORMAL LOW (ref 7.0–30.0)

## 2020-11-12 MED ORDER — STERILE WATER FOR INJECTION IJ SOLN
INTRAMUSCULAR | Status: AC
  Filled 2020-11-12: qty 10

## 2020-11-12 MED ORDER — SODIUM CHLORIDE 0.9 % IV BOLUS
1000.0000 mL | Freq: Once | INTRAVENOUS | Status: AC
  Administered 2020-11-12: 1000 mL via INTRAVENOUS

## 2020-11-12 MED ORDER — SODIUM CHLORIDE 0.9 % IV BOLUS
1000.0000 mL | Freq: Once | INTRAVENOUS | Status: AC
  Administered 2020-11-13: 1000 mL via INTRAVENOUS

## 2020-11-12 MED ORDER — ZIPRASIDONE MESYLATE 20 MG IM SOLR
20.0000 mg | Freq: Once | INTRAMUSCULAR | Status: AC
  Administered 2020-11-12: 20 mg via INTRAMUSCULAR
  Filled 2020-11-12: qty 20

## 2020-11-12 NOTE — ED Notes (Signed)
Placed external male condom catheter on patient.  

## 2020-11-12 NOTE — ED Notes (Signed)
Patient in 2 point restraints (hands)

## 2020-11-12 NOTE — Progress Notes (Addendum)
Finding and custody order for IVC was confirmed by fax at 17:22. (magistrate TD Sharee Pimple)  [employer] to serve patient at [facility]

## 2020-11-12 NOTE — Discharge Instructions (Addendum)
Take all medications as prescribed. Keep all follow-up appointments as scheduled.  Do not consume alcohol or use illegal drugs while on prescription medications. Report any adverse effects from your medications to your primary care provider promptly.  In the event of recurrent symptoms or worsening symptoms, call 911, a crisis hotline, or go to the nearest emergency department for evaluation. Take all medications as prescribed. Keep all follow-up appointments as scheduled.  Do not consume alcohol or use illegal drugs while on prescription medications. Report any adverse effects from your medications to your primary care provider promptly.  In the event of recurrent symptoms or worsening symptoms, call 911, a crisis hotline, or go to the nearest emergency department for evaluation.

## 2020-11-12 NOTE — ED Triage Notes (Signed)
Patient BIB [facility] for altered mental status from Meth use. EMS reports that patient would not stop trying to masturbating and would not follow directions.EMS had patient in 2 pt violent restraints

## 2020-11-12 NOTE — Telephone Encounter (Signed)
LVM regarding missed appointment today. I noted when his next scheduled appointment is and if he cannot make it to call us before his scheduled appointment time and we can work to cancel or reschedule that appointment for him.  Jalaya Sarver PT, DPT, LAT, ATC  11/12/20  12:01 PM

## 2020-11-12 NOTE — BH Assessment (Signed)
Comprehensive Clinical Assessment (CCA) Note  11/12/2020 Kenneth Hunt [id number]   The patient demonstrates the following risk factors for suicide: Chronic risk factors for suicide include: minimal support, health issues, history of depression, prior psychiatric hospitalizations, substance abuse issues. Acute risk factors for suicide include: is becoming homeless, actively using methamphetamine, prior suicidal thoughts, unemployment and loss of nursing license. Protective factors for this patient include:  Patient has two friends who are supportive, he is not actively suicidal or homicidal Considering these factors, the overall suicide risk at this point appears to be low. Patient is not appropriate for outpatient follow up.  PHQ2-9   Flowsheet Row Office Visit from 10/21/2020 in [facility] Office Visit from 10/17/2020 in [facility] Office Visit from 06/27/2020 in [facility] Office Visit from 04/06/2020 in [facility] for [facility] Disease Office Visit from 08/24/2019 in [facility] for Infectious Disease  PHQ-2 Total Score 1 6 1 2 1   PHQ-9 Total Score -- 15 -- -- --    Flowsheet Row ED from 11/12/2020 in [facility]  C-SSRS RISK CATEGORY No Risk      Disposition:  Per 01/12/2021, NP, patient is recommended for inpatient treatment  Patient presented to the [facility] intoxicated on methamphetamine stating, "I came here to have sex with all the black men and their sons who I love. I can fuck anyone I want." Patient was delusional and unable to answer aquestions.  His friends state that he has been using methamphetamine. They state that he has pending felony charges from breaking into someone's apartment while he was high on methamphetamine and today he was wanting to have sex with a minor. They brought him to the [facility] because they knew that  he was irruational and was going to do something to get himself into trouble.  Patient was intoxicated, taking his clothes off, constantly moving.  He was alert and semi-oriented.  His judgment insight and impulse control are impaired.  He does not appear to be responding to any internal stimuli.  He was delusional and thought everyone wanted to have sex with him.   Previous Note from this writer dated 11/01/19 Kenneth Hunt is an 40 y.o. male who was brought to [facility] after having been [place] by the [place] with suicidal ideation with a plan to jump.  Patient states that he attempted suicide in the past by overdose in 2013.   Patient is a former 2014, who is disabled with some form of medical issue that he states keeps his hands from functioning properly.  He also has been diagnosed with bipolar disorder in the past and has HIV.  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  He states that he has been hanging around with the wrong people and doing things more often than he should.  (evidently drugs and sex).   Patient states that his parents are deceased and states that he has no emotional support other than his three dogs.  He eludes that he has been in previous relationships with others who have let him down and he states that he really has no trust for others and he states that he does not feel like people need to be trying to make decisions for him.     Patient  states that he is not really depressed, he is not stressed.  He states, "I just want to die."  He states that It is not a depressed feeling, "just a certainty." Patient states that he has been feeling this way for a couple years now, but his thoughts of killing himself have worsened over the past two months.  Patient states that the only reason that he did not jump last night was fear that he would not be successful in his  suicide attempt and that he would be in physical pain and still not accomplish what he set out to do.  Patient states that he  that he wants to die and "I am positive that is what I want."  He further states, "There is nothing here for me in this world and I am kind of over it." Patient states that "I am building up the courage to do it and I know that I eventually will do it."  He states that he wishes that there was a law where he could be euthanized and his wishes be granted rather than "professional do-gooders making decisions concerning my life."   Patient states that he received outpatient BH services in 2013 after he attempted suicide.  However, he states that he has no current provider and states that he is currently not on any medications for depression.  Patient denies HI/Psychosis and states that for the most part that he has been sleeping and eating well.   Patient states that he has been using methamphetamines for the past five years.  He states that he is currently using on two to three occasions per week, but will not elaborate on how much he is using each time, but does admit that he has experienced an increase in tolerance.  Patient states that he used to use heroin in the past, but did not provide any specifics.  Chief Complaint:  Chief Complaint  Patient presents with  . Altered Mental Status  . 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 Family/Friend  Referral name: Gery Pray  Referral phone number: No data recorded  Whom do you see for routine medical problems? I don't have a doctor  Practice/Facility Name: No data recorded Practice/Facility Phone Number: No data recorded Name of Contact: No data recorded Contact Number: No data  recorded Contact Fax Number: No data recorded Prescriber Name: No data recorded Prescriber Address (if known): No data recorded  What Is the Reason for Your Visit/Call Today? Drug Induced Psychosis  How Long Has This Been Causing You Problems? > than 6 months  What Do You Feel Would Help You the Most Today? Alcohol or Drug Use Treatment   Have You Recently Been in Any Inpatient Treatment (Hospital/Detox/Crisis Center/28-Day Program)? No  Name/Location of Program/Hospital:No data recorded How Long Were You There? No data recorded When Were You Discharged? No data recorded  Have You Ever Received Services From [facility] Before? Yes  Who Do You See at [facility]? Pt has been assessed before by TTS   Have You Recently Had Any Thoughts About Hurting Yourself? No  Are You Planning to Commit Suicide/Harm Yourself At This time? No  Have you Recently Had Thoughts About Hurting Someone [name]? No  Explanation: No data recorded  Have You Used Any Alcohol or Drugs in the Past 24 Hours? Yes  How Long Ago Did You Use Drugs or Alcohol? 0000 (UTA)  What Did You Use and How Much? yesterday, methamphetamine   Do You Currently Have a Therapist/Psychiatrist? No  Name of Therapist/Psychiatrist: No data recorded  Have You Been Recently Discharged From Any Office Practice or Programs? No  Explanation of Discharge From Practice/Program: No data recorded    CCA Screening Triage Referral Assessment Type of Contact: Face-to-Face  Is this Initial or Reassessment? No data recorded Date Telepsych consult ordered in [facility]:  No data recorded Time Telepsych consult ordered in [facility]:  No data recorded  Patient Reported Information Reviewed? Yes  Patient Left Without Being Seen? No data recorded Reason for Not Completing Assessment: No data recorded  Collateral Involvement: Friends provided collateral information   Does Patient Have a Automotive engineer Guardian? No data recorded Name  and Contact of Legal Guardian: No data recorded If Minor and Not Living with Parent(s), Who has Custody? NA  Is CPS involved or ever been involved? Never  Is APS involved or ever been involved? Never   Patient Determined To Be At Risk for Harm To Self or Others Based on Review of Patient Reported Information or Presenting Complaint? No  Method: No data recorded Availability of Means: No data recorded Intent: No data recorded Notification Required: No data recorded Additional Information for Danger to Others Potential: No data recorded Additional Comments for Danger to Others Potential: No data recorded Are There Guns or Other Weapons in Your Home? No data recorded Types of Guns/Weapons: No data recorded Are These Weapons Safely Secured?                            No data recorded Who Could Verify You Are Able To Have These Secured: No data recorded Do You Have any Outstanding Charges, Pending Court Dates, Parole/Probation? No data recorded Contacted To Inform of Risk of Harm To Self or Others: Other: Comment (friends are aware)   [facility] of Assessment: [facility]   Does Patient Present under Involuntary Commitment? No  IVC Papers Initial File Date: No data recorded  [place] of Residence: [place]   Patient Currently Receiving the Following Services: Individual Therapy ([facility])   Determination of Need: Emergent (2 hours)   Options For Referral: Inpatient Hospitalization     CCA Biopsychosocial Intake/Chief Complaint:  Patient presented to the [facility] intoxicated on methamphetamine stating, "I came here to have sex with all the black men and their sons who I love. I can fuck anyone I want." Patient was delusional and unable to answer aquestions.  His friends state that he has been using methamphetamine. They state that he has pending felony charges from breaking into someone's apartment while he was high on methamphetamine and today he was wanting to  have sex with a minor. They brought him to the [facility] because they knew that he was irruational and was going to do something to get himself into trouble.  Current Symptoms/Problems: intoxicated on methamphetamine, disrobing   Patient Reported Schizophrenia/Schizoaffective Diagnosis in Past: No   Strengths: UTA  Preferences: UTA  Abilities: UTA   Type of Services Patient Feels are Needed: UTA   Initial Clinical Notes/Concerns: No data recorded  Mental Health Symptoms Depression:  None   Duration of  Depressive symptoms: No data recorded  Mania:  None   Anxiety:   None   Psychosis:  Delusions (substance Induced)   Duration of Psychotic symptoms: Less than six months   Trauma:  None   Obsessions:  None   Compulsions:  None   Inattention:  None   Hyperactivity/Impulsivity:  N/A   Oppositional/Defiant Behaviors:  None   Emotional Irregularity:  None   Other Mood/Personality Symptoms:  No data recorded   Mental Status Exam Appearance and self-care  Stature:  Small   Weight:  Average weight   Clothing:  Casual; Dirty; Disheveled   Grooming:  Neglected   Cosmetic use:  None   Posture/gait:  Normal   Motor activity:  Restless; Agitated   Sensorium  Attention:  Confused   Concentration:  Scattered; Variable   Orientation:  Object; Person; Place; Situation; Time   Recall/memory:  Normal   Affect and Mood  Affect:  Anxious; Inappropriate   Mood:  Other (Comment) (too intoxicated to assess)   Relating  Eye contact:  Normal   Facial expression:  Constricted   Attitude toward examiner:  Argumentative   Thought and Language  Speech flow: Flight of Ideas   Thought content:  Appropriate to Mood and Circumstances   Preoccupation:  Other (Comment) (hypersexual)   Hallucinations:  None   Organization:  No data recorded  [employer] of Knowledge:  Good   Intelligence:  Above Average   Abstraction:  Concrete   Judgement:   Dangerous; Impaired; Poor   Reality Testing:  Distorted   Insight:  None/zero insight   Decision Making:  Confused   Social Functioning  Social Maturity:  Impulsive   Social Judgement:  -- (unable to assess)   Stress  Stressors:  Legal; Relationship; Work   Coping Ability:  Human resources officer Deficits:  Scientist, physiological   Supports:  Support needed     Religion: Religion/Spirituality Are You A Religious Person?:  (not assessed)  Leisure/Recreation: Leisure / Recreation Do You Have Hobbies?:  (not assessed)  Exercise/Diet: Exercise/Diet Do You Exercise?:  (not assessed) Have You Gained or Lost A Significant Amount of Weight in the Past Six Months?:  (not assessed) Do You Follow a Special Diet?:  (not assessed) Do You Have Any Trouble Sleeping?:  (not assessed)   CCA Employment/Education Employment/Work Situation: Employment / Work Situation Employment situation: Unemployed Patient's job has been impacted by current illness: Yes 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 What is the longest time patient has a held a job?: UTA Where was the patient employed at that time?: UTA Has patient ever been in the [employer]?: No  Education: Education Is Patient Currently Attending School?:  (UTA) Last Grade Completed:  (UTA) Did Garment/textile technologist From [employer]?:  (UTA) Did You Attend College?:  (UTA) Did You Attend Graduate School?:  (UTA) Did You Have Any Special Interests In School?: UTA Did You Have An Individualized Education Program (IIEP): No Did You Have Any Difficulty At School?: No Patient's Education Has Been Impacted by Current Illness: No   CCA Family/Childhood History Family and Relationship History: Family history Marital status: Single Are you sexually active?: Yes What is your sexual orientation?: homosexual Has your sexual activity been affected by drugs, alcohol, medication, or emotional stress?: increases his sex  drive Does patient have children?: No  Childhood History:  Childhood History By whom was/is the patient raised?: Other (Comment) (Unable to assess family history) Description of patient's relationship  with caregiver when they were a child: UTA Patient's description of current relationship with people who raised him/her: UTA How were you disciplined when you got in trouble as a child/adolescent?: UTA Does patient have siblings?: Yes Number of Siblings:  (has at least 2 sisters) Description of patient's current relationship with siblings: UTA Did patient suffer any verbal/emotional/physical/sexual abuse as a child?:  (UTA) Did patient suffer from severe childhood neglect?:  (UTA) Has patient ever been sexually abused/assaulted/raped as an adolescent or adult?:  (UTA) Was the patient ever a victim of a crime or a disaster?:  (UTA) Witnessed domestic violence?:  (UTA) Has patient been affected by 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)  Child/Adolescent Assessment:     CCA Substance Use Alcohol/Drug Use: Alcohol / Drug Use Pain Medications: see MAR Prescriptions: See MAR Over the Counter: See MAR History of alcohol / drug use?: Yes Longest period of sobriety (when/how long): UTA Negative Consequences of Use: Financial,Legal,Personal relationships,Work / School Withdrawal Symptoms: Other (Comment) (patient is currently intoxicated) Substance #1 Name of Substance 1: methampetamine 1 - Age of First Use: UTA 1 - Amount (size/oz): UTA 1 - Frequency: UTA 1 - Duration: UTA 1 - Last Use / Amount: UTA 1 - Method of Aquiring: UTA 1- Route of Use: UTA                       ASAM's:  Six Dimensions of Multidimensional Assessment  Dimension 1:  Acute Intoxication and/or Withdrawal Potential:   Dimension 1:  Description of individual's past and current experiences of substance use and withdrawal: High withdrawal potential  Dimension 2:  Biomedical Conditions and Complications:    Dimension 2:  Description of patient's biomedical conditions and  complications: Pt has multiple health issues  Dimension 3:  Emotional, Behavioral, or Cognitive Conditions and Complications:  Dimension 3:  Description of emotional, behavioral, or cognitive conditions and complications: Pt has ongoing MH issues  Dimension 4:  Readiness to Change:  Dimension 4:  Description of Readiness to Change criteria: UTA  Dimension 5:  Relapse, Continued use, or Continued Problem Potential:  Dimension 5:  Relapse, continued use, or continued problem potential critiera description: High relapse rate  Dimension 6:  Recovery/Living Environment:  Dimension 6:  Recovery/Iiving environment criteria description: Patient is essentially homeless now  ASAM Severity Score: ASAM's Severity Rating Score: 12  ASAM Recommended Level of Treatment: ASAM Recommended Level of Treatment: Level III Residential Treatment   Substance use Disorder (SUD) Substance Use Disorder (SUD)  Checklist Symptoms of Substance Use: Continued use despite having a persistent/recurrent physical/psychological problem caused/exacerbated by use,Continued use despite persistent or recurrent social, interpersonal problems, caused or exacerbated by use,Evidence of tolerance,Large amounts of time spent to obtain, use or recover from the substance(s),Recurrent use that results in a failure to fulfill major role obligations (work, school, home),Social, occupational, recreational activities given up or reduced due to use,Substance(s) often taken in larger amounts or over longer times than was intended  Recommendations for Services/Supports/Treatments: Recommendations for Services/Supports/Treatments Recommendations For Services/Supports/Treatments: Residential-Level 3  DSM5 Diagnoses: Patient Active Problem List   Diagnosis Date Noted  . Amphetamine use disorder, mild (HCC)   . Amphetamine and psychostimulant-induced psychosis with delusions (HCC)   .  Healthcare maintenance 10/17/2020  . Hip pain, bilateral 10/17/2020  . 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 Injection of illicit drug within last 12 months 08/26/2019  . Bipolar 2 disorder (HCC) 03/04/2019  . Human immunodeficiency virus (HIV) disease (HCC)  10/01/2018  . Thoracic outlet syndrome 10/01/2018  . 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 Methamphetamine dependence (HCC) 12/30/2017  . Moderate recurrent major depression (HCC) 12/30/2017  . History of syphilis 12/30/2017       Referrals to Alternative Service(s): Referred to Alternative Service(s):   Place:   Date:   Time:    Referred to Alternative Service(s):   Place:   Date:   Time:    Referred to Alternative Service(s):   Place:   Date:   Time:    Referred to Alternative Service(s):   Place:   Date:   Time:     Kalani Baray J Nattie Lazenby, LCAS

## 2020-11-12 NOTE — ED Provider Notes (Signed)
Behavioral Health Urgent Care Medical Screening Exam  Patient Name: Kenneth Hunt MRN: [id number] Date of Evaluation: 11/12/20 Chief Complaint:   Diagnosis:  Final diagnoses:  Methamphetamine intoxication (HCC)    History of Present illness: Kenneth Hunt is a 39 y.o. male.  Presents to [facility] urgent care behavioral health facility accompanied by 2 roommates.  Who reports patient using "crystal meth" earlier today.  States patient hypersexual and has not been compliant with clothing.  No suicidal or homicidal ideations.  Patient presents agitated, disorganized and irritable.  NP initiated involuntary commitment.  Spoke to EDP for medical clearance.   Additional collateral: Roommate Dan reports patient has multiple encounters with [employer] regarding hyper sexual activity.  States today patient was trying to have sex with the neighbor's husband  and the neighbor son.  Denied any other mental health issues.   Psychiatric Specialty Exam  Presentation  General Appearance:Bizarre; Disheveled  Eye Contact:Minimal  Speech:Clear and Coherent; Pressured  Speech Volume:Normal  Handedness:Right   Mood and Affect  Mood:Anxious; Depressed; Dysphoric; Irritable; Labile  Affect:Inappropriate   Thought Process  Thought Processes:Linear  Descriptions of Associations:Loose  Orientation:Partial  Thought Content:Illogical; Rumination; Tangential    Hallucinations:None  Ideas of Reference:None  Suicidal Thoughts:No  Homicidal Thoughts:No   Sensorium  Memory:Recent Poor; Remote Poor; Immediate Poor  Judgment:Poor  Insight:Poor   Executive Functions  Concentration:Poor  Attention Span:Fair  Recall:Fair  Fund of Knowledge:Fair  Language:Fair   Psychomotor Activity  Psychomotor Activity:Normal   Assets  Assets:Social Support; Desire for Improvement   Sleep  Sleep:Fair  Number of hours: No data recorded  Nutritional Assessment (For OBS and FBC  admissions only) Has the patient had a weight loss or gain of 10 pounds or more in the last 3 months?: No Has the patient had a decrease in food intake/or appetite?: No Does the patient have dental problems?: No Does the patient have eating habits or behaviors that may be indicators of an eating disorder including binging or inducing vomiting?: No Has the patient recently lost weight without trying?: No Has the patient been eating poorly because of a decreased appetite?: No Malnutrition Screening Tool Score: 0    Physical Exam: Physical Exam ROS There were no vitals taken for this visit. There is no height or weight on file to calculate BMI.  Musculoskeletal: Strength & Muscle Tone: within normal limits Gait & Station: normal Patient leans: N/A   [facility] MSE Discharge Disposition for Follow up and Recommendations: Based on my evaluation the patient appears to have an emergency medical condition for which I recommend the patient be transferred to the emergency department for further evaluation.   -Involuntary commitment initiated fax to the magistrate at 13:15 -Overnight observation  Oneta Rack, NP 11/12/2020, 5:19 PM

## 2020-11-12 NOTE — ED Provider Notes (Addendum)
[facility]-EMERGENCY DEPT Provider Note   CSN: [id number] Arrival date & time: 11/12/20  1722     History Chief Complaint  Patient presents with  . Altered Mental Status  . Addiction Problem    Kenneth Hunt is a 40 y.o. male.  Patient is a 40 year old male who is brought in by EMS from the behavioral health urgent care.  He had been brought into the behavioral health urgent care by his roommates after he was having some bizarre and hypersexual behavior after taking crystal meth.  He has no history of using crystal meth.  He also has a history of HIV, thoracic outlet syndrome and depression.  He was noted to be tachycardic and very hypersexual, masturbating in the EMS unit.  He was given 5 mg of midazolam by EMS with no significant improvement in symptoms.  He has not been violent.  He was placed in restraints although due to his ongoing masturbation.  He does admit to using crystal meth earlier today.  He denies any other ingestions.  He denies any homicidal or suicidal ideations.  IVC papers were initiated by the behavioral health urgent care.        Past Medical History:  Diagnosis Date  . Anxiety   . Brachial plexus disorders 07/31/2018   Seen by [facility] neurology. MRI brain and cervical spine showed bilateral neural foraminal cysts C4-C5, C5-C6, C6-C7, brain within normal limits.  - continue cymbalta, flexeril, neurontin - f/u with Dr. Debbra Riding [facility] neurology 03/28/19  . Depression   . 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 HIV (human immunodeficiency virus infection) (HCC)   . TOS (thoracic outlet syndrome)     Patient Active Problem List   Diagnosis Date Noted  . Amphetamine use disorder, mild (HCC)   . Amphetamine and psychostimulant-induced psychosis with delusions (HCC)   . Healthcare maintenance 10/17/2020  . Hip pain, bilateral 10/17/2020  . 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 Injection of illicit drug within last 12 months 08/26/2019  . Bipolar 2 disorder (HCC) 03/04/2019  . Human immunodeficiency virus (HIV) disease (HCC) 10/01/2018  . Thoracic outlet syndrome 10/01/2018  . 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 Methamphetamine dependence (HCC) 12/30/2017  . Moderate recurrent major depression (HCC) 12/30/2017  . History of syphilis 12/30/2017    Past Surgical History:  Procedure Laterality Date  . tos surgery         Family History  Problem Relation Age of Onset  . COPD Mother     Social History   Tobacco Use  . Smoking status: Never Smoker  . Smokeless tobacco: Never Used  Vaping Use  . Vaping Use: Never used  Substance Use Topics  . Alcohol use: Not Currently  . Drug use: Yes    Types: IV, Methamphetamines    Comment: last used last nght    Home Medications Prior to Admission medications   Medication Sig Start Date End Date Taking? Authorizing Provider  acetaminophen (TYLENOL) 500 MG tablet Take 1 tablet (500 mg total) by mouth every 6 (six) hours as needed. 11/11/19   Lorelee New, PA-C  bictegravir-emtricitabine-tenofovir AF (BIKTARVY) 50-200-25 MG TABS tablet Take 1 tablet by mouth daily. 10/21/20   Blanchard Kelch, NP  DULoxetine (CYMBALTA) 30 MG capsule Take 3 capsules (90 mg total) by mouth daily. 10/17/20 01/15/21  Evlyn Kanner, MD  gabapentin (NEURONTIN) 300 MG capsule Take 1 capsule (300 mg total) by mouth  3 (three) times daily. 10/17/20 01/15/21  Evlyn Kanner, MD  hydrOXYzine (ATARAX/VISTARIL) 25 MG tablet Take 1 tablet (25 mg total) by mouth 3 (three) times daily as needed. 06/27/20   Blanchard Kelch, NP  triamcinolone ointment (KENALOG) 0.5 % Apply 1 application topically 2 (two) times daily. 06/27/20   Blanchard Kelch, NP    Allergies    Haloperidol  Review of Systems   Review of Systems  Unable to perform ROS: Mental status change    Physical Exam Updated Vital Signs BP 123/67   Pulse  (!) 105   Temp 98.1 F (36.7 C) (Oral)   Resp 19   Ht 5\' 3"  (1.6 m)   Wt 76.3 kg   SpO2 99%   BMI 29.80 kg/m   Physical Exam Constitutional:      Appearance: He is well-developed.  HENT:     Head: Normocephalic and atraumatic.  Eyes:     Pupils: Pupils are equal, round, and reactive to light.  Cardiovascular:     Rate and Rhythm: Regular rhythm. Tachycardia present.     Heart sounds: Normal heart sounds.  Pulmonary:     Effort: Pulmonary effort is normal. No respiratory distress.     Breath sounds: Normal breath sounds. No wheezing or rales.  Chest:     Chest wall: No tenderness.  Abdominal:     General: Bowel sounds are normal.     Palpations: Abdomen is soft.     Tenderness: There is no abdominal tenderness. There is no guarding or rebound.  Musculoskeletal:        General: Normal range of motion.     Cervical back: Normal range of motion and neck supple.  Lymphadenopathy:     Cervical: No cervical adenopathy.  Skin:    General: Skin is warm and dry.     Findings: No rash.  Neurological:     General: No focal deficit present.     Mental Status: He is alert.     Comments: Patient is oriented to person and place,  Psychiatric:        Mood and Affect: Mood is anxious.        Speech: Speech is tangential.        Behavior: Behavior is agitated.        Thought Content: Thought content is delusional.     ED Results / Procedures / Treatments   Labs (all labs ordered are listed, but only abnormal results are displayed) Labs Reviewed  COMPREHENSIVE METABOLIC PANEL - Abnormal; Notable for the following components:      Result Value   Potassium 5.4 (*)    Glucose, Bld 124 (*)    All other components within normal limits  SALICYLATE LEVEL - Abnormal; Notable for the following components:   Salicylate Lvl <7.0 (*)    All other components within normal limits  ACETAMINOPHEN LEVEL - Abnormal; Notable for the following components:   Acetaminophen (Tylenol), Serum <10  (*)    All other components within normal limits  CBC WITH DIFFERENTIAL/PLATELET - Abnormal; Notable for the following components:   WBC 14.6 (*)    Neutro Abs 13.4 (*)    All other components within normal limits  CBG MONITORING, ED - Abnormal; Notable for the following components:   Glucose-Capillary 121 (*)    All other components within normal limits  ETHANOL  CK  RAPID URINE DRUG SCREEN, HOSP PERFORMED    EKG EKG Interpretation  Date/Time:  Saturday November 12 2020  17:59:19 EDT Ventricular Rate:  143 PR Interval:  116 QRS Duration: 87 QT Interval:  282 QTC Calculation: 435 R Axis:   98 Text Interpretation: Right and left arm electrode reversal, interpretation assumes no reversal Sinus tachycardia Borderline right axis deviation ST elevation, consider inferior injury since last tracing no significant change Confirmed by Rolan Bucco [phone]) on 11/12/2020 6:41:35 PM   Radiology No results found.  Procedures Procedures   Medications Ordered in ED Medications  sodium chloride 0.9 % bolus 1,000 mL (has no administration in time range)  ziprasidone (GEODON) injection 20 mg (20 mg Intramuscular Given 11/12/20 1750)  sterile water (preservative free) injection (  Given 11/12/20 1750)  sodium chloride 0.9 % bolus 1,000 mL (0 mLs Intravenous Stopped 11/12/20 1919)    ED Course  I have reviewed the triage vital signs and the nursing notes.  Pertinent labs & imaging results that were available during my care of the patient were reviewed by me and considered in my medical decision making (see chart for details).    MDM Rules/Calculators/A&P                          Patient is a 40 year old male who presents with psychosis related to probable methamphetamine use as a transfer from the [facility].  He was markedly tachycardic on arrival.  He was agitated and required a dose of Geodon.  He was placed in restraints.  He was observed here for several hours and his heart rate has improved to  low 100.  His mental status seems to be improving.  We will try him out of restraints and give him a p.o. trial.  His labs are nonconcerning.  He has an elevated WBC count which is likely from the agitation, stress release.  He is not febrile.  He has a very minimally elevated potassium but no evidence of acute kidney injury.  He was given IV fluids.  There is no evidence of rhabdomyolysis.  Once he is fully awake and alert, he can be reassessed for reversal of his IVC and potential discharge versus ongoing psych assessment.  Dr. Adela Lank to take over care. 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

## 2020-11-13 ENCOUNTER — Other Ambulatory Visit: Payer: Self-pay

## 2020-11-13 NOTE — ED Notes (Signed)
Per verbal order from MD, wrist restraints removed and pt given PO challenge. Pt currently calm and cooperative. Sitter is at bedside.

## 2020-11-13 NOTE — ED Provider Notes (Signed)
Received the patient in signout from Dr. Fredderick Phenix, briefly the patient is a 40 year old male with a chief complaints of acute agitation.  I believe to be secondary to methamphetamine abuse.  Given Geodon for sedation here.  Plan to reassess.  Patient is now alert and oriented.  Endorses methamphetamine use earlier today.  Will discharge the patient home.   Melene Plan, DO 11/13/20 [phone]

## 2020-11-13 NOTE — ED Notes (Signed)
Pt verbalized understanding of d/c and follow up care. Ambulatory with steady gait.  

## 2020-11-16 ENCOUNTER — Telehealth: Payer: Self-pay | Admitting: Physical Therapy

## 2020-11-16 ENCOUNTER — Ambulatory Visit: Payer: Medicare Other | Admitting: Physical Therapy

## 2020-11-16 NOTE — Telephone Encounter (Signed)
Attempted to contact patient due to missed PT appointment. Left VM informing patient of missed appointment and of his next scheduled appointment on 11/23/2020 at 3:00pm with Anne Ng. Patient reminded of attendance policy, that he will only be able to scheduled 1 visit at a time moving forward due to no-show rate, and will be discharged from PT if he misses the next appointment. Patient encouraged to contact the clinic if he needs to cancel or reschedule his remaining appointment and provided clinic contact information.  Rosana Hoes, PT, DPT, LAT, ATC 11/16/20  4:58 PM Phone: [phone] Fax: [phone]

## 2020-11-21 DIAGNOSIS — F1510 Other stimulant abuse, uncomplicated: Secondary | ICD-10-CM | POA: Diagnosis not present

## 2020-11-22 ENCOUNTER — Ambulatory Visit

## 2020-11-22 DIAGNOSIS — F1520 Other stimulant dependence, uncomplicated: Secondary | ICD-10-CM | POA: Diagnosis not present

## 2020-11-23 ENCOUNTER — Other Ambulatory Visit: Payer: Self-pay

## 2020-11-23 ENCOUNTER — Encounter: Payer: Self-pay | Admitting: Physical Therapy

## 2020-11-23 ENCOUNTER — Ambulatory Visit: Payer: Medicare Other | Admitting: Physical Therapy

## 2020-11-23 ENCOUNTER — Ambulatory Visit: Payer: Medicare Other

## 2020-11-23 DIAGNOSIS — M25552 Pain in left hip: Secondary | ICD-10-CM

## 2020-11-23 DIAGNOSIS — M6281 Muscle weakness (generalized): Secondary | ICD-10-CM

## 2020-11-23 DIAGNOSIS — M25551 Pain in right hip: Secondary | ICD-10-CM

## 2020-11-23 NOTE — Therapy (Signed)
[facility] [address] [place], [place], [address] Phone: [phone]   Fax:  [phone]  Physical Therapy Treatment  Patient Details  Name: Kenneth Hunt MRN: [id number] Date of Birth: [birth year] Referring Provider (PT): Earl Lagos, MD   Encounter Date: 11/23/2020   PT End of Session - 11/23/20 1526    Visit Number 2    Number of Visits 7    Date for PT Re-Evaluation 12/20/20    Authorization Type MCR: kx mod 15th visit FOTO 7th and 11 visit.    PT Start Time 1501    PT Stop Time 1539    PT Time Calculation (min) 38 min    Activity Tolerance Patient tolerated treatment well    Behavior During Therapy WFL for tasks assessed/performed           Past Medical History:  Diagnosis Date  . Anxiety   . 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 MRI brain and cervical spine showed bilateral neural foraminal cysts C4-C5, C5-C6, C6-C7, brain within normal limits.  - continue cymbalta, flexeril, neurontin - f/u with Dr. Debbra Riding [facility] neurology 03/28/19  . Depression   . 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 HIV (human immunodeficiency virus infection) (HCC)   . TOS (thoracic outlet syndrome)     Past Surgical History:  Procedure Laterality Date  . tos surgery      There were no vitals filed for this visit.   Subjective Assessment - 11/23/20 1506    Subjective "I've been trying to take more buses and reduce my walking. I've been trying to do my exercise. I haven't had any popping. with minimal soreness eveyr now and again."    Diagnostic tests 3/7/ 2022 xray bil   IMPRESSION:  No significant radiographic abnormality involving either hip.    Currently in Pain? Yes    Pain Score 4     Pain Orientation Right    Pain Descriptors / Indicators Aching    Pain Onset More than a month ago    Pain Frequency Intermittent    Aggravating  Factors  pivoting, walking really long distances              [facility] PT Assessment - 11/23/20 0001      Assessment   Medical Diagnosis Acute hip pain, bilateral    Referring Provider (PT) Earl Lagos, MD                         [facility] Adult PT Treatment/Exercise - 11/23/20 0001      Exercises   Exercises Knee/Hip      Knee/Hip Exercises: Stretches   Hip Flexor Stretch 3 reps;Right;30 seconds   in thomas stretch positoin   Other Knee/Hip Stretches standing adductor stretch 2 x 30 second focusing on the R      Knee/Hip Exercises: Aerobic   Nustep L5 x 5 min LE only      Knee/Hip Exercises: Machines for Strengthening   Cybex Leg Press --      Knee/Hip Exercises: Seated   Marching Strengthening;Right;2 sets;15 reps   with green theraband around thighs     Knee/Hip Exercises: Sidelying   Hip ABduction 2 sets;15 reps;Strengthening;Right;Left                  PT Education - 11/23/20 1529    Education Details Reviewed FOTO assessment  Person(s) Educated Patient    Methods Explanation;Verbal cues;Handout    Comprehension Verbalized understanding;Verbal cues required            PT Short Term Goals - 11/23/20 1529      PT SHORT TERM GOAL #1   Title pt to be IND with inital HEP    Period Weeks    Status On-going      PT SHORT TERM GOAL #2   Title capture FOTO on second visit and update goals.    Period Weeks    Status Achieved             PT Long Term Goals - 11/23/20 1529      PT LONG TERM GOAL #1   Title increase gross R hip strength to >/=4+/5 to promote hip stability to reduce popping with gait    Period Weeks    Status On-going      PT LONG TERM GOAL #2   Title pt to be able to stand and walk for >/= 2 hours with no report of popping and max pain of </= 2/10    Period Weeks    Status On-going      PT LONG TERM GOAL #3   Title pt to be IND with all HEP and is able to maintain and progress current LOF IND    Period  Weeks    Status On-going      PT LONG TERM GOAL #4   Title increase FOTO score to >/=60% to demo improvement in function.    Time 6    Period Weeks    Status New    Target Date 12/06/20                 Plan - 11/23/20 1531    Clinical Impression Statement pt returns for his follow visit 4 weeks after his evaluation due to missing the last 2 sessions. he reports some compliance with his HEP and noted reductions in pain and poppin gin the hip today today. reviewed HEP and discussed importance of consistency. obtained FOTO assessment today and reviewed benefits of exercise.    PT Treatment/Interventions ADLs/Self Care Home Management;Electrical Stimulation;Cryotherapy;Iontophoresis 4mg /ml Dexamethasone;Moist Heat;Traction;Ultrasound;Gait training;Stair training;Functional mobility training;Therapeutic activities;Therapeutic exercise;Balance training;Neuromuscular re-education;Manual techniques;Dry needling;Passive range of motion;Taping;Patient/family education    PT Next Visit Plan review / update HEP PRN, CAPTURE FOTO,  STW for hip flexors/ TFL, gross hip strengthening emphasis on hip flexor, abduction/ extension, assess gait    Consulted and Agree with Plan of Care Patient           Patient will benefit from skilled therapeutic intervention in order to improve the following deficits and impairments:  Improper body mechanics,Decreased strength,Increased muscle spasms,Postural dysfunction,Pain,Decreased activity tolerance,Decreased endurance  Visit Diagnosis: Pain in right hip  Pain in left hip  Muscle weakness (generalized)     Problem List Patient Active Problem List   Diagnosis Date Noted  . Amphetamine use disorder, mild (HCC)   . Amphetamine and psychostimulant-induced psychosis with delusions (HCC)   . Healthcare maintenance 10/17/2020  . Hip pain, bilateral 10/17/2020  . 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 Injection of illicit drug within  last 12 months 08/26/2019  . Bipolar 2 disorder (HCC) 03/04/2019  . Human immunodeficiency virus (HIV) disease (HCC) 10/01/2018  . Thoracic outlet syndrome 10/01/2018  . 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 Methamphetamine dependence (HCC) 12/30/2017  . Moderate recurrent major depression (HCC) 12/30/2017  . History of syphilis 12/30/2017  Lulu Riding PT, DPT, LAT, ATC  11/23/20  3:41 PM      [facility] [address] [place], [place], [address] Phone: [phone]   Fax:  [phone]  Name: Derreon Consalvo MRN: [id number] Date of Birth: Sep 12, 1980

## 2020-11-24 ENCOUNTER — Telehealth: Payer: Self-pay

## 2020-11-24 ENCOUNTER — Ambulatory Visit: Payer: Medicare Other

## 2020-11-24 NOTE — Telephone Encounter (Signed)
Pls contact pt [phone]

## 2020-11-24 NOTE — Telephone Encounter (Signed)
Return pt's call - first he asked if he has an appt scheduled with Dr Marijo Conception; informed he did not  Secondly, he stated when he was in [place], he was getting injections (one was [place] and another one for meth addiction) ; wanted to know if Dr Marijo Conception could prescribed or know he can restart these meds. Call transferred to front office to schedule his next appt.

## 2020-11-24 NOTE — Telephone Encounter (Signed)
I don't think so. It looks like he has an upcoming appt with Dr.Sena based on Dr.Winstead's notes.

## 2020-11-24 NOTE — Telephone Encounter (Signed)
Does he needs an appt for a psychiatry referral?

## 2020-11-24 NOTE — Telephone Encounter (Signed)
They would need to be prescribed by psychiatry

## 2020-11-25 DIAGNOSIS — F1510 Other stimulant abuse, uncomplicated: Secondary | ICD-10-CM | POA: Diagnosis not present

## 2020-11-25 DIAGNOSIS — F1520 Other stimulant dependence, uncomplicated: Secondary | ICD-10-CM | POA: Diagnosis not present

## 2020-11-28 ENCOUNTER — Other Ambulatory Visit: Payer: Self-pay

## 2020-11-28 ENCOUNTER — Ambulatory Visit: Payer: Medicare Other | Admitting: Physical Therapy

## 2020-11-28 ENCOUNTER — Encounter: Payer: Self-pay | Admitting: Physical Therapy

## 2020-11-28 ENCOUNTER — Telehealth: Payer: Self-pay

## 2020-11-28 DIAGNOSIS — M25551 Pain in right hip: Secondary | ICD-10-CM | POA: Diagnosis not present

## 2020-11-28 DIAGNOSIS — M25552 Pain in left hip: Secondary | ICD-10-CM

## 2020-11-28 DIAGNOSIS — F1510 Other stimulant abuse, uncomplicated: Secondary | ICD-10-CM | POA: Diagnosis not present

## 2020-11-28 DIAGNOSIS — M6281 Muscle weakness (generalized): Secondary | ICD-10-CM

## 2020-11-28 DIAGNOSIS — F1520 Other stimulant dependence, uncomplicated: Secondary | ICD-10-CM | POA: Diagnosis not present

## 2020-11-28 NOTE — Telephone Encounter (Signed)
Pls contact pt [phone]; pt has found referral and want to tell the physician about it

## 2020-11-28 NOTE — Therapy (Signed)
[facility] Outpatient Rehabilitation [facility] [address] [place], [place], [address] Phone: [phone]   Fax:  [phone]  Physical Therapy Treatment  Patient Details  Name: Kenneth Hunt MRN: [id number] Date of Birth: 1981/03/31 Referring Provider (PT): Earl Lagos, MD   Encounter Date: 11/28/2020   PT End of Session - 11/28/20 1454    Visit Number 3    Number of Visits 7    Date for PT Re-Evaluation 12/20/20    Authorization Type MCR: kx mod 15th visit FOTO 7th and 11 visit.    PT Start Time 1446    PT Stop Time 1527    PT Time Calculation (min) 41 min    Activity Tolerance Patient tolerated treatment well    Behavior During Therapy WFL for tasks assessed/performed           Past Medical History:  Diagnosis Date  . Anxiety   . Brachial plexus disorders 07/31/2018   Seen by [facility] neurology. MRI brain and cervical spine showed bilateral neural foraminal cysts C4-C5, C5-C6, C6-C7, brain within normal limits.  - continue cymbalta, flexeril, neurontin - f/u with Dr. Debbra Riding [facility] neurology 03/28/19  . Depression   . 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 HIV (human immunodeficiency virus infection) (HCC)   . TOS (thoracic outlet syndrome)     Past Surgical History:  Procedure Laterality Date  . tos surgery      There were no vitals filed for this visit.   Subjective Assessment - 11/28/20 1447    Subjective Patient reports things are going not too bad. He has still been trying to walk less and take the bus further. Patient notes pain is deep, gnawing type of pain. Pain is exacerbated by long distances.    Patient Stated Goals to decrease pain    Currently in Pain? Yes    Pain Score 5     Pain Location Hip    Pain Orientation Right    Pain Descriptors / Indicators Tightness   "gnawing"   Pain Type Chronic pain              [facility] PT Assessment - 11/28/20 0001       Assessment   Medical Diagnosis Acute hip pain, bilateral    Referring Provider (PT) Earl Lagos, MD      ROM / Strength   AROM / PROM / Strength PROM;Strength      PROM   Overall PROM Comments Right hip PROM grossly WFL, pain with end range flexion and combination flex+add+IR   concordant deep anterior hip pain     Strength   Right Hip Flexion 4+/5    Right Hip Extension 4/5    Right Hip ABduction 4-/5    Left Hip Flexion 4+/5    Left Hip Extension 4/5    Left Hip ABduction 4/5      Flexibility   Soft Tissue Assessment /Muscle Length yes    Hamstrings [facility]                         [facility] Adult PT Treatment/Exercise - 11/28/20 0001      Exercises   Exercises Knee/Hip      Knee/Hip Exercises: Stretches   Hip Flexor Stretch 30 seconds    Hip Flexor Stretch Limitations thomas position and standing    Other Knee/Hip Stretches Standing adductor stretch x 30 seconds      Knee/Hip  Exercises: Aerobic   Recumbent Bike L2 x 5 min      Knee/Hip Exercises: Standing   Functional Squat 2 sets;10 reps    Functional Squat Limitations squat to table focusing on hip hinge technique      Knee/Hip Exercises: Supine   Bridges 2 sets;10 reps   3 sec hold   Straight Leg Raises 10 reps    Straight Leg Raises Limitations patient report grinding sensation anterior deep right hip so exercise d/c'd    Other Supine Knee/Hip Exercises Marching with green band 2 x 20      Knee/Hip Exercises: Sidelying   Clams 2 x 10 with green                  PT Education - 11/28/20 1453    Education Details HEP update    Person(s) Educated Patient    Methods Explanation;Demonstration;Verbal cues;Handout    Comprehension Verbalized understanding;Returned demonstration;Verbal cues required;Need further instruction            PT Short Term Goals - 11/23/20 1529      PT SHORT TERM GOAL #1   Title pt to be IND with inital HEP    Period Weeks    Status On-going      PT SHORT  TERM GOAL #2   Title capture FOTO on second visit and update goals.    Period Weeks    Status Achieved             PT Long Term Goals - 11/23/20 1529      PT LONG TERM GOAL #1   Title increase gross R hip strength to >/=4+/5 to promote hip stability to reduce popping with gait    Period Weeks    Status On-going      PT LONG TERM GOAL #2   Title pt to be able to stand and walk for >/= 2 hours with no report of popping and max pain of </= 2/10    Period Weeks    Status On-going      PT LONG TERM GOAL #3   Title pt to be IND with all HEP and is able to maintain and progress current LOF IND    Period Weeks    Status On-going      PT LONG TERM GOAL #4   Title increase FOTO score to >/=60% to demo improvement in function.    Time 6    Period Weeks    Status New    Target Date 12/06/20                 Plan - 11/28/20 1455    Clinical Impression Statement Patient tolerated therapy well with no adverse effects. He does exhibit greater strength deficit on right so progressed hip/gluteal strengthening with good tolerance. Patient did report concordant pain with FADIR testing and grinding sensation with SLR so possible intraarticular pathology. Updated HEP with good tolerance. Patient would benefit from continued skilled PT to reduce pain and progress strength in order to maximize functional ability.    PT Treatment/Interventions ADLs/Self Care Home Management;Electrical Stimulation;Cryotherapy;Iontophoresis 4mg /ml Dexamethasone;Moist Heat;Traction;Ultrasound;Gait training;Stair training;Functional mobility training;Therapeutic activities;Therapeutic exercise;Balance training;Neuromuscular re-education;Manual techniques;Dry needling;Passive range of motion;Taping;Patient/family education    PT Next Visit Plan review / update HEP PRN, STW for hip flexors/ TFL, gross hip strengthening emphasis on hip flexor, abduction/ extension    PT Home Exercise Plan [email or web address]    Consulted and  Agree with Plan of Care Patient  Patient will benefit from skilled therapeutic intervention in order to improve the following deficits and impairments:  Improper body mechanics,Decreased strength,Increased muscle spasms,Postural dysfunction,Pain,Decreased activity tolerance,Decreased endurance  Visit Diagnosis: Pain in right hip  Pain in left hip  Muscle weakness (generalized)     Problem List Patient Active Problem List   Diagnosis Date Noted  . Amphetamine use disorder, mild (HCC)   . Amphetamine and psychostimulant-induced psychosis with delusions (HCC)   . Healthcare maintenance 10/17/2020  . Hip pain, bilateral 10/17/2020  . 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 Injection of illicit drug within last 12 months 08/26/2019  . Bipolar 2 disorder (HCC) 03/04/2019  . Human immunodeficiency virus (HIV) disease (HCC) 10/01/2018  . Thoracic outlet syndrome 10/01/2018  . 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 Methamphetamine dependence (HCC) 12/30/2017  . Moderate recurrent major depression (HCC) 12/30/2017  . History of syphilis 12/30/2017    Rosana Hoes, PT, DPT, LAT, ATC 11/28/20  3:47 PM Phone: [phone] Fax: [phone]   [facility] Outpatient Rehabilitation [facility] [address] [place], [place], [address] Phone: [phone]   Fax:  [phone]  Name: Milon Dethloff MRN: [id number] Date of Birth: April 15, 1981

## 2020-11-28 NOTE — Telephone Encounter (Signed)
Called pt - no answer; left message to call the office . 

## 2020-11-28 NOTE — Telephone Encounter (Signed)
[facility]  [address]  [phone]   Appt 1130am  Pt is trying to get them to give them medical; pt already found a referral, he wanted to let the physician know that he found a place to go

## 2020-11-29 ENCOUNTER — Ambulatory Visit: Payer: Medicare Other | Admitting: Behavioral Health

## 2020-11-29 DIAGNOSIS — F1594 Other stimulant use, unspecified with stimulant-induced mood disorder: Secondary | ICD-10-CM

## 2020-11-29 DIAGNOSIS — F419 Anxiety disorder, unspecified: Secondary | ICD-10-CM

## 2020-11-29 DIAGNOSIS — F331 Major depressive disorder, recurrent, moderate: Secondary | ICD-10-CM

## 2020-11-29 NOTE — BH Specialist Note (Signed)
Integrated Behavioral Health via Telemedicine Visit  11/29/2020 Kenneth Hunt [id number]  Number of Integrated Behavioral Health visits: 2/6 Session Start time: 2:00pm  Session End time: 2:50PM Total time: 50 min  Pt current mailing address: [facility], [address], [place], [place] [address]  Referring Provider: Dr. Evlyn Kanner, MD Patient/Family location: Pt is in private [facility] Provider location: Clinician working remotely in private All persons participating in visit: Pt & Clinician Types of Service: Indiv Psychotherapy  I connected with Jaishawn Eggebrecht and/or Cleve Ardoin's self  via  Telephone or Engineer, civil (consulting)  (Video is Caregility application) and verified that I am speaking with the correct person using two identifiers. Discussed confidentiality: Yes  I discussed the limitations of telemedicine and the availability of in person appointments.  Discussed there is a possibility of technology failure and discussed alternative modes of communication if that failure occurs.  I discussed that engaging in this telemedicine visit, they consent to the provision of behavioral healthcare and the services will be billed under their insurance.  Patient and/or legal guardian expressed understanding and consented to Telemedicine visit: Yes  Presenting Concerns: Patient and/or family reports the following symptoms/concerns: Pt cont's to have issues w/his Recovery Support/Relapse Prevention efforts. Pt sts he has been in jail since we last spoke for 3 wks due to his Rx use & patterns of using when he is in pain, lonely, or bored & wanting human connection. Duration of problem: since initial visit w/Provider; Severity of problem: moderate to severe  Patient and/or Family's Strengths/Protective Factors: Pt is persistent & pursuing resources in his best interest.  Goals Addressed: Patient will: 1.  Reduce symptoms of: boredom by keeping busy,  loneliness by keeping engaged w/others @ [facility] & [facility], & putting his own welfare first & not that of others by making himself a priority & keeping good boundaries. 2.  Increase knowledge and/or ability of: Pt to stabilize his own emot'l reality by boundary-setting & emot'l regulation skills. 3.  Demonstrate ability to: assert his own needs & put himself first in Recovery efforts.  Progress towards Goals: Moderate usage to smoking & not injecting meth. Modulate his desire for a relationship to second priority & Recovery to first priority-keep goals in place to ensure his financial viability & independence. Quit sabotaging efforts to get clean in the face of relational security.  Cont classes @ [facility] on MWF & [facility] in the afternoons. Attend all appts mandated by Disability Review process.   Interventions: Interventions utilized:  SFBT & MI Standardized Assessments completed: Not needed today  Patient and/or Family Response: Pt receptive to call & future appts.  Assessment: Patient currently experiencing beh enhancing to relapse occurances. Suggested improved way to approach life improvement & reduction of frenetic Rx-seeking beh.  Patient may benefit from cont'd support for Recovery efforts & Relapse prevention. Pt is tied to multiple sources of support.  Plan: 1. Follow up with behavioral health clinician on : early May appt for 60 min telehealth 2. Behavioral recommendations: suggestions listed above 3. Referral(s): Pt has connected w/all sources suggested for resources to support recovery  I discussed the assessment and treatment plan with the patient and/or parent/guardian. They were provided an opportunity to ask questions and all were answered. They agreed with the plan and demonstrated an understanding of the instructions.   They were advised to call back or seek an in-person evaluation if the symptoms worsen or if the condition fails to improve as  anticipated.  [place]  Iverson Alamin, LMFT

## 2020-11-29 NOTE — Telephone Encounter (Signed)
Okay thank you

## 2020-11-30 DIAGNOSIS — F1510 Other stimulant abuse, uncomplicated: Secondary | ICD-10-CM | POA: Diagnosis not present

## 2020-12-02 DIAGNOSIS — F1510 Other stimulant abuse, uncomplicated: Secondary | ICD-10-CM | POA: Diagnosis not present

## 2020-12-05 DIAGNOSIS — F1520 Other stimulant dependence, uncomplicated: Secondary | ICD-10-CM | POA: Diagnosis not present

## 2020-12-05 DIAGNOSIS — F1510 Other stimulant abuse, uncomplicated: Secondary | ICD-10-CM | POA: Diagnosis not present

## 2020-12-07 DIAGNOSIS — F1510 Other stimulant abuse, uncomplicated: Secondary | ICD-10-CM | POA: Diagnosis not present

## 2020-12-09 DIAGNOSIS — F1510 Other stimulant abuse, uncomplicated: Secondary | ICD-10-CM | POA: Diagnosis not present

## 2020-12-12 DIAGNOSIS — F1510 Other stimulant abuse, uncomplicated: Secondary | ICD-10-CM | POA: Diagnosis not present

## 2020-12-13 DIAGNOSIS — F1510 Other stimulant abuse, uncomplicated: Secondary | ICD-10-CM | POA: Diagnosis not present

## 2020-12-13 DIAGNOSIS — F3132 Bipolar disorder, current episode depressed, moderate: Secondary | ICD-10-CM | POA: Diagnosis not present

## 2020-12-14 DIAGNOSIS — F1510 Other stimulant abuse, uncomplicated: Secondary | ICD-10-CM | POA: Diagnosis not present

## 2020-12-16 DIAGNOSIS — F1520 Other stimulant dependence, uncomplicated: Secondary | ICD-10-CM | POA: Diagnosis not present

## 2020-12-16 DIAGNOSIS — F1510 Other stimulant abuse, uncomplicated: Secondary | ICD-10-CM | POA: Diagnosis not present

## 2020-12-19 ENCOUNTER — Ambulatory Visit: Payer: Medicare (Managed Care) | Attending: Internal Medicine | Admitting: Physical Therapy

## 2020-12-19 ENCOUNTER — Other Ambulatory Visit: Payer: Self-pay

## 2020-12-19 ENCOUNTER — Encounter: Payer: Self-pay | Admitting: Physical Therapy

## 2020-12-19 DIAGNOSIS — M25552 Pain in left hip: Secondary | ICD-10-CM | POA: Insufficient documentation

## 2020-12-19 DIAGNOSIS — M6281 Muscle weakness (generalized): Secondary | ICD-10-CM | POA: Insufficient documentation

## 2020-12-19 DIAGNOSIS — M25551 Pain in right hip: Secondary | ICD-10-CM | POA: Diagnosis not present

## 2020-12-19 DIAGNOSIS — F1510 Other stimulant abuse, uncomplicated: Secondary | ICD-10-CM | POA: Diagnosis not present

## 2020-12-19 DIAGNOSIS — F1520 Other stimulant dependence, uncomplicated: Secondary | ICD-10-CM | POA: Diagnosis not present

## 2020-12-19 NOTE — Therapy (Signed)
[facility] Outpatient Rehabilitation [facility] [address] [place], [place], [address] Phone: [phone]   Fax:  [phone]  Physical Therapy Treatment / ERO  Patient Details  Name: Kenneth Hunt MRN: [id number] Date of Birth: 11/04/1980 Referring Provider (PT): Earl Lagos, MD   Encounter Date: 12/19/2020   PT End of Session - 12/19/20 1604    Visit Number 4    Number of Visits 10    Date for PT Re-Evaluation 01/30/21    Authorization Type MCR: kx mod 15th visit FOTO 11 visit.    Progress Note Due on Visit 10    PT Start Time 1530    PT Stop Time 1615    PT Time Calculation (min) 45 min    Activity Tolerance Patient tolerated treatment well    Behavior During Therapy WFL for tasks assessed/performed           Past Medical History:  Diagnosis Date  . Anxiety   . Brachial plexus disorders 07/31/2018   Seen by [facility] neurology. MRI brain and cervical spine showed bilateral neural foraminal cysts C4-C5, C5-C6, C6-C7, brain within normal limits.  - continue cymbalta, flexeril, neurontin - f/u with Dr. Debbra Riding [facility] neurology 03/28/19  . Depression   . 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 HIV (human immunodeficiency virus infection) (HCC)   . TOS (thoracic outlet syndrome)     Past Surgical History:  Procedure Laterality Date  . tos surgery      There were no vitals filed for this visit.   Subjective Assessment - 12/19/20 1535    Subjective Patient reports he has been having more discomfort in the front of the right hip that developed last week, it hurt more with coughing and it was tender. Currently the pain is better than it originally was.    Limitations Walking    How long can you walk comfortably? Approximately 1-2 hours but with increased pain    Patient Stated Goals to decrease pain    Currently in Pain? Yes    Pain Score 6     Pain Location Hip    Pain Orientation Right    deep   Pain Descriptors / Indicators Aching    Pain Type Chronic pain    Pain Onset More than a month ago    Pain Frequency Intermittent              [facility] PT Assessment - 12/19/20 0001      Assessment   Medical Diagnosis Acute hip pain, bilateral    Referring Provider (PT) Earl Lagos, MD    Next MD Visit 01/12/2021      Precautions   Precautions None      Restrictions   Weight Bearing Restrictions No      Balance Screen   Has the patient fallen in the past 6 months No      Prior Function   Level of Independence Independent      Observation/Other Assessments   Observations Patient appears in no apparent distress    Focus on Therapeutic Outcomes (FOTO)  46% functional status      PROM   Overall PROM Comments Right hip PROM grossly WFL except slighty limited hip IR, no pain noted at end range this visit      Strength   Right Hip Flexion 4+/5    Right Hip Extension 4/5    Right Hip External Rotation  4/5    Right  Hip Internal Rotation 4/5    Right Hip ABduction 4-/5    Left Hip Flexion 4+/5    Left Hip Extension 4+/5    Left Hip External Rotation 4/5    Left Hip Internal Rotation 4/5    Left Hip ABduction 4/5                         [facility] Adult PT Treatment/Exercise - 12/19/20 0001      Self-Care   Self-Care Other Self-Care Comments    Other Self-Care Comments  FOTO, POC      Exercises   Exercises Knee/Hip      Knee/Hip Exercises: Stretches   Hip Flexor Stretch 3 reps;30 seconds    Hip Flexor Stretch Limitations 1/2 kneeling hip flexor stretch with rear foot elevated      Knee/Hip Exercises: Aerobic   Recumbent Bike L2 x 4 min      Knee/Hip Exercises: Supine   Bridges Limitations Figure-4 bridge 2 x 8    Straight Leg Raises 2 sets;10 reps    Other Supine Knee/Hip Exercises Dead bug 2 x 10   leg extension     Knee/Hip Exercises: Sidelying   Clams 2 x 10 with blue                  PT Education - 12/19/20 1603     Education Details HEP update, POC, FOTO    Person(s) Educated Patient    Methods Explanation;Demonstration;Verbal cues;Handout    Comprehension Verbalized understanding;Returned demonstration;Verbal cues required;Need further instruction            PT Short Term Goals - 12/19/20 1619      PT SHORT TERM GOAL #1   Title pt to be IND with inital HEP    Period Weeks    Status Achieved      PT SHORT TERM GOAL #2   Title capture FOTO on second visit and update goals.    Period Weeks    Status Achieved             PT Long Term Goals - 12/19/20 1619      PT LONG TERM GOAL #1   Title increase gross R hip strength to >/=4+/5 to promote hip stability to reduce popping with gait    Baseline hip strength grossly 4/5 MMT    Time 6    Period Weeks    Status On-going    Target Date 01/30/21      PT LONG TERM GOAL #2   Title pt to be able to stand and walk for >/= 2 hours with no report of popping and max pain of </= 2/10    Baseline Patient reports pain 6/10 with extended periods of walking    Time 6    Period Weeks    Status On-going    Target Date 01/30/21      PT LONG TERM GOAL #3   Title pt to be IND with all HEP and is able to maintain and progress current LOF IND    Baseline progressing with HEP    Time 6    Period Weeks    Status On-going    Target Date 01/30/21      PT LONG TERM GOAL #4   Title increase FOTO score to >/=60% to demo improvement in function.    Baseline 46%    Time 6    Period Weeks    Status On-going  Target Date 01/30/21                 Plan - 12/19/20 1610    Clinical Impression Statement Patient tolerated therapy well with no adverse effects. He does exhibit improved hip strength and reports improved functional level on FOTO, but continues to report right hip pain with activity and extended periods of walking. Progressed with hip strengthening and stretching this visit with good tolerance. He does require occasional cueing for  exercise technique and pelvic control. HEP udpated this visit with good tolerance and POC extended for 6 more weeks at 1x/week frequency. Patient would benefit from continued skilled PT to reduce pain and progress strength in order to maximize functional ability.    PT Frequency 1x / week    PT Duration 6 weeks    PT Treatment/Interventions ADLs/Self Care Home Management;Electrical Stimulation;Cryotherapy;Iontophoresis 4mg /ml Dexamethasone;Moist Heat;Traction;Ultrasound;Gait training;Stair training;Functional mobility training;Therapeutic activities;Therapeutic exercise;Balance training;Neuromuscular re-education;Manual techniques;Dry needling;Passive range of motion;Taping;Patient/family education    PT Next Visit Plan review / update HEP PRN, STW for hip flexors/ TFL, gross hip strengthening emphasis on hip flexor, abduction/ extension    PT Home Exercise Plan [email or web address]    Consulted and Agree with Plan of Care Patient           Patient will benefit from skilled therapeutic intervention in order to improve the following deficits and impairments:  Improper body mechanics,Decreased strength,Increased muscle spasms,Postural dysfunction,Pain,Decreased activity tolerance,Decreased endurance  Visit Diagnosis: Pain in right hip  Pain in left hip  Muscle weakness (generalized)     Problem List Patient Active Problem List   Diagnosis Date Noted  . Amphetamine use disorder, mild (HCC)   . Amphetamine and psychostimulant-induced psychosis with delusions (HCC)   . Healthcare maintenance 10/17/2020  . Hip pain, bilateral 10/17/2020  . 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 Injection of illicit drug within last 12 months 08/26/2019  . Bipolar 2 disorder (HCC) 03/04/2019  . Human immunodeficiency virus (HIV) disease (HCC) 10/01/2018  . Thoracic outlet syndrome 10/01/2018  . 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 Methamphetamine dependence (HCC) 12/30/2017  . Moderate  recurrent major depression (HCC) 12/30/2017  . History of syphilis 12/30/2017    01/01/2018, PT, DPT, LAT, ATC 12/19/20  4:27 PM Phone: [phone] Fax: [phone]   [facility] Outpatient Rehabilitation [facility] [address] [place], [place], [place] Phone: [phone]   Fax:  [phone]  Name: Mena Lienau MRN: Gwynn Burly Date of Birth: [birth year]

## 2020-12-19 NOTE — Patient Instructions (Signed)
Access Code: [email or web address] URL: [email or web address] Date: 12/19/2020 Prepared by: Rosana Hoes  Exercises Half Kneeling Hip Flexor Stretch with Chair - 1 x daily - 7 x weekly - 3 reps - 30 hold Standing Hip Flexor Stretch - 1 x daily - 7 x weekly - 2 reps - 30 hold Side Lunge Adductor Stretch - 1 x daily - 7 x weekly - 2 reps - 30 hold Supine Active Straight Leg Raise - 1 x daily - 7 x weekly - 3 sets - 10 reps Figure 4 Bridge - 1 x daily - 7 x weekly - 3 sets - 8 reps Supine Dead Bug with Leg Extension - 1 x daily - 5 x weekly - 3 sets - 10 reps Clamshell with Resistance - 1 x daily - 5 x weekly - 3 sets - 10 reps Squat with Chair Touch - 1 x daily - 5 x weekly - 3 sets - 10 reps

## 2020-12-21 DIAGNOSIS — F1510 Other stimulant abuse, uncomplicated: Secondary | ICD-10-CM | POA: Diagnosis not present

## 2020-12-22 ENCOUNTER — Telehealth: Payer: Self-pay

## 2020-12-22 NOTE — Telephone Encounter (Signed)
Patient called wanting to check on status of request sent by social security for disability determination. Advised patient that medical records requests are processed by health information department, gave patient their phone number and advised him to follow up with social security and possibly ask them to resend request. Patient verbalized understanding and has no further questions.   Sandie Ano, RN

## 2020-12-23 DIAGNOSIS — F1510 Other stimulant abuse, uncomplicated: Secondary | ICD-10-CM | POA: Diagnosis not present

## 2020-12-26 DIAGNOSIS — F1510 Other stimulant abuse, uncomplicated: Secondary | ICD-10-CM | POA: Diagnosis not present

## 2020-12-26 DIAGNOSIS — F1520 Other stimulant dependence, uncomplicated: Secondary | ICD-10-CM | POA: Diagnosis not present

## 2020-12-27 ENCOUNTER — Other Ambulatory Visit: Payer: Self-pay

## 2020-12-27 ENCOUNTER — Ambulatory Visit: Payer: Medicare (Managed Care) | Admitting: Behavioral Health

## 2020-12-27 DIAGNOSIS — F331 Major depressive disorder, recurrent, moderate: Secondary | ICD-10-CM

## 2020-12-27 DIAGNOSIS — F3181 Bipolar II disorder: Secondary | ICD-10-CM

## 2020-12-27 DIAGNOSIS — F419 Anxiety disorder, unspecified: Secondary | ICD-10-CM

## 2020-12-27 NOTE — BH Specialist Note (Signed)
Integrated Behavioral Health via Telemedicine Visit  12/27/2020 Kenneth Hunt [id number]  Number of Integrated Behavioral Health visits: 3/6 Session Start time: 2:00pm  Session End time: 2:45pm Total time: 45   Referring Provider: Dr. Evlyn Kanner, MD Patient/Family location: Pt is @ [facility] in private [facility] Provider location: [facility] Office All persons participating in visit: Pt & Clinician Types of Service: Individual psychotherapy  I connected with Kenneth Hunt and/or Kenneth Hunt's sefl via  Telephone or Video Enabled Telemedicine Application  (Video is Caregility application) and verified that I am speaking with the correct person using two identifiers. Discussed confidentiality: Yes   I discussed the limitations of telemedicine and the availability of in person appointments.  Discussed there is a possibility of technology failure and discussed alternative modes of communication if that failure occurs.  I discussed that engaging in this telemedicine visit, they consent to the provision of behavioral healthcare and the services will be billed under their insurance.  Patient and/or legal guardian expressed understanding and consented to Telemedicine visit: Yes   Presenting Concerns: Patient and/or family reports the following symptoms/concerns: elevated Duration of problem: years; Severity of problem: moderate  Patient and/or Family's Strengths/Protective Factors: Social and Emotional competence, Concrete supports in place (healthy food, safe environments, etc.) and Sense of purpose  Goals Addressed: Patient will: 1.  Reduce symptoms of: anxiety, depression and stress  2.  Increase knowledge and/or ability of: coping skills, healthy habits, self-management skills and stress reduction  3.  Demonstrate ability to: Increase healthy adjustment to current life circumstances and Decrease self-medicating behaviors  Progress towards  Goals: Ongoing  Interventions: Interventions utilized:  Solution-Focused Strategies and Supportive Counseling Standardized Assessments completed: Not Needed  Patient and/or Family Response: Pt receptive to session today & requests future appt  Assessment: Patient currently experiencing elevated anxiety & stress due to upcoming Court Date in mid-June for his Felony charge in [place]. Pt is frustrated w/his Px limitations invlg his R arm weakness & his hip pain. Pt also has stress rxn to his upcoming Re-Eval for Disability. This occurs on Fri, May 20/2022. He feels his health & mental health are all hanging in the balance. He realizes it will be difficult to find a job if his charges are not dropped to a misdemeanor.  Pt has found a Therapist, sports through [employer] in [place]. This Provider is prescribing Vivitrol injections monthly & Aristada for his mood swings. Pt feels these are helping him. He has adjusted how much walking he does by using [employer]. He also walks slower.   Pt is staying @ [facility] & has 8 Housemates. Pt is attending [employer] wkly & Pt spends his day hours from 9:30am until about 3:00pm @ [facility]. Pt attends [facility] for Outpatient care MWF.      Patient may benefit from cont'd psychotherapy consistent w/twice monthly, support for cont'd compliance w/medications & encouragement for keeping a busy schedule to prevent relapse.  Plan: 1. Follow up with behavioral health clinician on : 2-3 wks for 60 min f:f. 2. Behavioral recommendations: Keep note of concerns btwn sessions to focus your time in visits. Keep reminding yourself how much you have accomplished & will cont to progress from as you move forward. 3. Referral(s): Integrated [facility] (In Clinic)  I discussed the assessment and treatment plan with the patient and/or parent/guardian. They were provided an opportunity to ask questions and all were answered. They agreed with the plan and  demonstrated an understanding  of the instructions.   They were advised to call back or seek an in-person evaluation if the symptoms worsen or if the condition fails to improve as anticipated.  Deneise Lever, LMFT

## 2020-12-28 DIAGNOSIS — F1510 Other stimulant abuse, uncomplicated: Secondary | ICD-10-CM | POA: Diagnosis not present

## 2020-12-29 ENCOUNTER — Ambulatory Visit: Payer: Medicare (Managed Care) | Admitting: Physical Therapy

## 2020-12-30 DIAGNOSIS — F1510 Other stimulant abuse, uncomplicated: Secondary | ICD-10-CM | POA: Diagnosis not present

## 2021-01-02 DIAGNOSIS — F1510 Other stimulant abuse, uncomplicated: Secondary | ICD-10-CM | POA: Diagnosis not present

## 2021-01-03 ENCOUNTER — Ambulatory Visit: Payer: Medicare (Managed Care) | Admitting: Physical Therapy

## 2021-01-03 ENCOUNTER — Encounter: Payer: Self-pay | Admitting: Physical Therapy

## 2021-01-03 ENCOUNTER — Other Ambulatory Visit: Payer: Self-pay

## 2021-01-03 DIAGNOSIS — M6281 Muscle weakness (generalized): Secondary | ICD-10-CM

## 2021-01-03 DIAGNOSIS — M25551 Pain in right hip: Secondary | ICD-10-CM

## 2021-01-03 DIAGNOSIS — M25552 Pain in left hip: Secondary | ICD-10-CM

## 2021-01-03 NOTE — Therapy (Signed)
[facility] Outpatient Rehabilitation [facility] [address] [place], [place], [address] Phone: [phone]   Fax:  [phone]  Physical Therapy Treatment  Patient Details  Name: Kenneth Hunt MRN: [id number] Date of Birth: 1981-03-10 Referring Provider (PT): Earl Lagos, MD   Encounter Date: 01/03/2021   PT End of Session - 01/03/21 0922    Visit Number 5    Number of Visits 10    Date for PT Re-Evaluation 01/30/21    Authorization Type MCR: kx mod 15th visit FOTO 11 visit.    Progress Note Due on Visit 10    PT Start Time 0915    PT Stop Time 1000    PT Time Calculation (min) 45 min    Activity Tolerance Patient tolerated treatment well    Behavior During Therapy WFL for tasks assessed/performed           Past Medical History:  Diagnosis Date  . Anxiety   . Brachial plexus disorders 07/31/2018   Seen by [facility] neurology. MRI brain and cervical spine showed bilateral neural foraminal cysts C4-C5, C5-C6, C6-C7, brain within normal limits.  - continue cymbalta, flexeril, neurontin - f/u with Dr. Debbra Riding [facility] neurology 03/28/19  . Depression   . 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 HIV (human immunodeficiency virus infection) (HCC)   . TOS (thoracic outlet syndrome)     Past Surgical History:  Procedure Laterality Date  . tos surgery      There were no vitals filed for this visit.   Subjective Assessment - 01/03/21 0915    Subjective Patient reports a little bit of mild pain in the hip but nothing like when first started. Still gets aggravated when he walks extended distances repeatedly.    Patient Stated Goals to decrease pain    Currently in Pain? Yes    Pain Score 5     Pain Location Hip    Pain Orientation Right   deep   Pain Descriptors / Indicators Aching    Pain Type Chronic pain    Pain Onset More than a month ago    Pain Frequency Intermittent    Aggravating Factors   Walking long distances repeatedly              [facility] PT Assessment - 01/03/21 0001      Strength   Right Hip Flexion 4+/5    Right Hip Extension 4/5    Right Hip ABduction 4-/5                         [facility] Adult PT Treatment/Exercise - 01/03/21 0001      Exercises   Exercises Knee/Hip      Knee/Hip Exercises: Stretches   Hip Flexor Stretch 2 reps;30 seconds    Hip Flexor Stretch Limitations 1/2 kneeling hip flexor stretch      Knee/Hip Exercises: Aerobic   Recumbent Bike L2 x 4 min      Knee/Hip Exercises: Standing   Functional Squat Limitations Trialed deadlift but patient unable to perform due to RUE previous injury, he did exhibit difficulty coordinating proper hip hinge technique    Other Standing Knee Exercises Clamshell with red band 2 x 15 each    Other Standing Knee Exercises Rear foot elevated split squat 2 x 10 each   patient reported greater difficulty on right     Knee/Hip Exercises: Supine   Bridges 2 sets;10 reps  Bridges Limitations legs extended on chair with alternating hip flexion    Other Supine Knee/Hip Exercises Eccentric hip flexor into thomas position 2 x 10 each                  PT Education - 01/03/21 0921    Education Details HEP update    Person(s) Educated Patient    Methods Explanation;Demonstration;Verbal cues;Handout    Comprehension Verbalized understanding;Need further instruction;Returned demonstration;Verbal cues required            PT Short Term Goals - 12/19/20 1619      PT SHORT TERM GOAL #1   Title pt to be IND with inital HEP    Period Weeks    Status Achieved      PT SHORT TERM GOAL #2   Title capture FOTO on second visit and update goals.    Period Weeks    Status Achieved             PT Long Term Goals - 12/19/20 1619      PT LONG TERM GOAL #1   Title increase gross R hip strength to >/=4+/5 to promote hip stability to reduce popping with gait    Baseline hip strength grossly 4/5  MMT    Time 6    Period Weeks    Status On-going    Target Date 01/30/21      PT LONG TERM GOAL #2   Title pt to be able to stand and walk for >/= 2 hours with no report of popping and max pain of </= 2/10    Baseline Patient reports pain 6/10 with extended periods of walking    Time 6    Period Weeks    Status On-going    Target Date 01/30/21      PT LONG TERM GOAL #3   Title pt to be IND with all HEP and is able to maintain and progress current LOF IND    Baseline progressing with HEP    Time 6    Period Weeks    Status On-going    Target Date 01/30/21      PT LONG TERM GOAL #4   Title increase FOTO score to >/=60% to demo improvement in function.    Baseline 46%    Time 6    Period Weeks    Status On-going    Target Date 01/30/21                 Plan - 01/03/21 1201    Clinical Impression Statement Patient tolerated therapy well with no adverse effects. Therapy focused on continued hip strengthening to improve walking tolerance. Progressed to more standing strengthening this visit and patient did require cueng for proper technique and lumbopelvic control. Patient unable to perform deadlift exercise due to previous RUE plexus injury but he did exhibit great difficulty coordinating hip hinge technique. He reports feeling right hip weaker with exercises. Updated HEP to include rear foot elevated split squat to progress strength. Patient would benefit from continued skilled PT to reduce pain and progress strength in order to improve walking and maximize functional ability.    PT Treatment/Interventions ADLs/Self Care Home Management;Electrical Stimulation;Cryotherapy;Iontophoresis 4mg /ml Dexamethasone;Moist Heat;Traction;Ultrasound;Gait training;Stair training;Functional mobility training;Therapeutic activities;Therapeutic exercise;Balance training;Neuromuscular re-education;Manual techniques;Dry needling;Passive range of motion;Taping;Patient/family education    PT Next  Visit Plan review / update HEP PRN, STW for hip flexors/ TFL, gross hip strengthening emphasis on hip flexor, abduction/ extension    PT Home Exercise  Plan [email or web address]    Consulted and Agree with Plan of Care Patient           Patient will benefit from skilled therapeutic intervention in order to improve the following deficits and impairments:  Improper body mechanics,Decreased strength,Increased muscle spasms,Postural dysfunction,Pain,Decreased activity tolerance,Decreased endurance  Visit Diagnosis: Pain in right hip  Pain in left hip  Muscle weakness (generalized)     Problem List Patient Active Problem List   Diagnosis Date Noted  . Amphetamine use disorder, mild (HCC)   . Amphetamine and psychostimulant-induced psychosis with delusions (HCC)   . Healthcare maintenance 10/17/2020  . Hip pain, bilateral 10/17/2020  . 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 Injection of illicit drug within last 12 months 08/26/2019  . Bipolar 2 disorder (HCC) 03/04/2019  . Human immunodeficiency virus (HIV) disease (HCC) 10/01/2018  . Thoracic outlet syndrome 10/01/2018  . 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 Methamphetamine dependence (HCC) 12/30/2017  . Moderate recurrent major depression (HCC) 12/30/2017  . History of syphilis 12/30/2017    Rosana Hoes, PT, DPT, LAT, ATC 01/03/21  12:09 PM Phone: [phone] Fax: [phone]   [facility] Outpatient Rehabilitation [facility] [address] [place], [place], [address] Phone: [phone]   Fax:  [phone]  Name: Kenneth Hunt MRN: [id number] Date of Birth: 1981/05/16

## 2021-01-03 NOTE — Patient Instructions (Signed)
Access Code: [email or web address] URL: [email or web address] Date: 01/03/2021 Prepared by: Rosana Hoes  Exercises Half Kneeling Hip Flexor Stretch with Chair - 1 x daily - 7 x weekly - 3 reps - 30 hold Standing Hip Flexor Stretch - 1 x daily - 7 x weekly - 2 reps - 30 hold Side Lunge Adductor Stretch - 1 x daily - 7 x weekly - 2 reps - 30 hold Supine Active Straight Leg Raise - 1 x daily - 7 x weekly - 3 sets - 10 reps Figure 4 Bridge - 1 x daily - 7 x weekly - 3 sets - 8 reps Supine Dead Bug with Leg Extension - 1 x daily - 5 x weekly - 3 sets - 10 reps Clamshell with Resistance - 1 x daily - 5 x weekly - 3 sets - 10 reps Squat with Chair Touch - 1 x daily - 5 x weekly - 3 sets - 10 reps Single Leg Lunge with Foot on Bench - 1 x daily - 7 x weekly - 3 sets - 10 reps

## 2021-01-04 DIAGNOSIS — F1510 Other stimulant abuse, uncomplicated: Secondary | ICD-10-CM | POA: Diagnosis not present

## 2021-01-06 DIAGNOSIS — F1510 Other stimulant abuse, uncomplicated: Secondary | ICD-10-CM | POA: Diagnosis not present

## 2021-01-09 DIAGNOSIS — F1510 Other stimulant abuse, uncomplicated: Secondary | ICD-10-CM | POA: Diagnosis not present

## 2021-01-09 DIAGNOSIS — F1520 Other stimulant dependence, uncomplicated: Secondary | ICD-10-CM | POA: Diagnosis not present

## 2021-01-11 DIAGNOSIS — F1510 Other stimulant abuse, uncomplicated: Secondary | ICD-10-CM | POA: Diagnosis not present

## 2021-01-12 ENCOUNTER — Encounter: Payer: Self-pay | Admitting: Physical Therapy

## 2021-01-12 ENCOUNTER — Ambulatory Visit (INDEPENDENT_AMBULATORY_CARE_PROVIDER_SITE_OTHER): Payer: Medicare (Managed Care) | Admitting: Student

## 2021-01-12 ENCOUNTER — Encounter: Payer: Self-pay | Admitting: Student

## 2021-01-12 ENCOUNTER — Other Ambulatory Visit: Payer: Self-pay

## 2021-01-12 ENCOUNTER — Ambulatory Visit: Payer: Medicare (Managed Care) | Attending: Internal Medicine | Admitting: Physical Therapy

## 2021-01-12 VITALS — BP 128/83 | HR 89 | Temp 98.4°F | Ht 63.0 in | Wt 171.8 lb

## 2021-01-12 DIAGNOSIS — M25552 Pain in left hip: Secondary | ICD-10-CM | POA: Diagnosis not present

## 2021-01-12 DIAGNOSIS — M25551 Pain in right hip: Secondary | ICD-10-CM | POA: Diagnosis not present

## 2021-01-12 DIAGNOSIS — M6281 Muscle weakness (generalized): Secondary | ICD-10-CM | POA: Diagnosis not present

## 2021-01-12 DIAGNOSIS — G540 Brachial plexus disorders: Secondary | ICD-10-CM | POA: Diagnosis not present

## 2021-01-12 DIAGNOSIS — F1520 Other stimulant dependence, uncomplicated: Secondary | ICD-10-CM

## 2021-01-12 DIAGNOSIS — B20 Human immunodeficiency virus [HIV] disease: Secondary | ICD-10-CM | POA: Diagnosis not present

## 2021-01-12 DIAGNOSIS — F3181 Bipolar II disorder: Secondary | ICD-10-CM

## 2021-01-12 DIAGNOSIS — Z139 Encounter for screening, unspecified: Secondary | ICD-10-CM

## 2021-01-12 MED ORDER — GABAPENTIN 400 MG PO CAPS: 400.0000 mg | ORAL_CAPSULE | Freq: Three times a day (TID) | ORAL | 2 refills | Status: DC

## 2021-01-12 NOTE — Progress Notes (Signed)
   CC: neuropathy  HPI:  Mr.Babyboy Hanrahan is a 40 y.o. 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  Please see problem-based list for further details, assessments, and plans.  Past Medical History:  Diagnosis Date  . Anxiety   . 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 MRI brain and cervical spine showed bilateral neural foraminal cysts C4-C5, C5-C6, C6-C7, brain within normal limits.  - continue cymbalta, flexeril, neurontin - f/u with Dr. Debbra Riding [facility] neurology 03/28/19  . Depression   . 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 HIV (human immunodeficiency virus infection) (HCC)   . TOS (thoracic outlet syndrome)    Review of Systems:  As per HPI  Physical Exam:  Vitals:   01/12/21 1514  BP: 128/83  Pulse: 89  Temp: 98.4 F (36.9 C)  TempSrc: Oral  SpO2: 99%  Weight: 171 lb 12.8 oz (77.9 kg)  Height: 5\' 3"  (1.6 m)   Physical Exam: General: Sitting in chair in no acute distress CV: Regular rate, rhythm. No m/r/g. Pulm: Normal work of breathing on room air. Clear to auscultation bilaterally. MSK: Normal bulk, tone. No pitting edema bilaterally. Neuro: Awake, alert, answering questions appropriately. Grip strength 3/5 on right. RUE/LUE 4/5. Otherwise motor 5/5 throughout. Sensation in tact. Psych: Normal mood, affect. Tangential speech.  Assessment & Plan:   See Encounters Tab for problem based charting.  Patient discussed with Dr. 

## 2021-01-12 NOTE — Therapy (Signed)
[facility] Outpatient Rehabilitation [facility] [address] [place], [place], [address] Phone: [phone]   Fax:  [phone]  Physical Therapy Treatment  Patient Details  Name: Kenneth Hunt MRN: [id number] Date of Birth: August 29, 1980 Referring Provider (PT): Earl Lagos, MD   Encounter Date: 01/12/2021   PT End of Session - 01/12/21 1614    Visit Number 6    Number of Visits 10    Date for PT Re-Evaluation 01/30/21    Authorization Type MCR: kx mod 15th visit FOTO 11 visit.    Progress Note Due on Visit 10    PT Start Time 1615    PT Stop Time 1655    PT Time Calculation (min) 40 min    Activity Tolerance Patient tolerated treatment well    Behavior During Therapy WFL for tasks assessed/performed           Past Medical History:  Diagnosis Date  . Anxiety   . Brachial plexus disorders 07/31/2018   Seen by [facility] neurology. MRI brain and cervical spine showed bilateral neural foraminal cysts C4-C5, C5-C6, C6-C7, brain within normal limits.  - continue cymbalta, flexeril, neurontin - f/u with Dr. Debbra Riding [facility] neurology 03/28/19  . Depression   . 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 HIV (human immunodeficiency virus infection) (HCC)   . TOS (thoracic outlet syndrome)     Past Surgical History:  Procedure Laterality Date  . tos surgery      There were no vitals filed for this visit.   Subjective Assessment - 01/12/21 1618    Subjective Patient reports continued hip pain. He has been consistent with exercises and they are going well.    Patient Stated Goals to decrease pain    Currently in Pain? Yes    Pain Score 5     Pain Location Hip    Pain Orientation Right   deep   Pain Descriptors / Indicators Aching    Pain Type Chronic pain    Pain Onset More than a month ago    Pain Frequency Constant              [facility] PT Assessment - 01/12/21 0001      Observation/Other  Assessments   Focus on Therapeutic Outcomes (FOTO)  36% functional status                         [facility] Adult PT Treatment/Exercise - 01/12/21 0001      Exercises   Exercises Knee/Hip      Knee/Hip Exercises: Aerobic   Nustep L5 x 5 min with LE while taking subjective      Knee/Hip Exercises: Standing   Lunge Walking - Round Trips Lateral band walk with green around knees 2 x 15    Other Standing Knee Exercises SL RDL with slider 2 x 10    Other Standing Knee Exercises Rear foot elevated split squat 2 x 10 each      Knee/Hip Exercises: Supine   Other Supine Knee/Hip Exercises Eccentric hip flexor into thomas position 2 x 20 each   2# ankle weight used for 2nd set     Knee/Hip Exercises: Prone   Other Prone Exercises Quadruped hydrant 2 x 15 with red    Other Prone Exercises Quadruped donkey kick with green 2 x 15  PT Education - 01/12/21 1614    Education Details HEP update, FOTO    Person(s) Educated Patient    Methods Explanation;Demonstration;Verbal cues;Handout    Comprehension Verbalized understanding;Returned demonstration;Verbal cues required;Need further instruction            PT Short Term Goals - 12/19/20 1619      PT SHORT TERM GOAL #1   Title pt to be IND with inital HEP    Period Weeks    Status Achieved      PT SHORT TERM GOAL #2   Title capture FOTO on second visit and update goals.    Period Weeks    Status Achieved             PT Long Term Goals - 12/19/20 1619      PT LONG TERM GOAL #1   Title increase gross R hip strength to >/=4+/5 to promote hip stability to reduce popping with gait    Baseline hip strength grossly 4/5 MMT    Time 6    Period Weeks    Status On-going    Target Date 01/30/21      PT LONG TERM GOAL #2   Title pt to be able to stand and walk for >/= 2 hours with no report of popping and max pain of </= 2/10    Baseline Patient reports pain 6/10 with extended periods of walking     Time 6    Period Weeks    Status On-going    Target Date 01/30/21      PT LONG TERM GOAL #3   Title pt to be IND with all HEP and is able to maintain and progress current LOF IND    Baseline progressing with HEP    Time 6    Period Weeks    Status On-going    Target Date 01/30/21      PT LONG TERM GOAL #4   Title increase FOTO score to >/=60% to demo improvement in function.    Baseline 46%    Time 6    Period Weeks    Status On-going    Target Date 01/30/21                 Plan - 01/12/21 1615    Clinical Impression Statement Patient tolerated therapy well with no adverse effects. He is tolerating progressions in hip strengthening well and reports improvement in pain this visit but did decline on FOTO since last assessment. Therapy focused on continued strength progression and incorporated further standing exercises to improve functionality. Updated HEP with good tolerance. Patient would benefit from continued skilled PT to reduce pain and progress strength in order to improve walking and maximize functional ability.    PT Treatment/Interventions ADLs/Self Care Home Management;Electrical Stimulation;Cryotherapy;Iontophoresis 4mg /ml Dexamethasone;Moist Heat;Traction;Ultrasound;Gait training;Stair training;Functional mobility training;Therapeutic activities;Therapeutic exercise;Balance training;Neuromuscular re-education;Manual techniques;Dry needling;Passive range of motion;Taping;Patient/family education    PT Next Visit Plan review / update HEP PRN, STW for hip flexors/ TFL, gross hip strengthening emphasis on hip flexor, abduction/ extension    PT Home Exercise Plan [email or web address]    Consulted and Agree with Plan of Care Patient           Patient will benefit from skilled therapeutic intervention in order to improve the following deficits and impairments:  Improper body mechanics,Decreased strength,Increased muscle spasms,Postural dysfunction,Pain,Decreased activity  tolerance,Decreased endurance  Visit Diagnosis: Pain in right hip  Pain in left hip  Muscle weakness (generalized)     Problem  List Patient Active Problem List   Diagnosis Date Noted  . Amphetamine use disorder, mild (HCC)   . Amphetamine and psychostimulant-induced psychosis with delusions (HCC)   . Healthcare maintenance 10/17/2020  . Hip pain, bilateral 10/17/2020  . 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 Injection of illicit drug within last 12 months 08/26/2019  . Bipolar 2 disorder (HCC) 03/04/2019  . Human immunodeficiency virus (HIV) disease (HCC) 10/01/2018  . Thoracic outlet syndrome 10/01/2018  . 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 Methamphetamine dependence (HCC) 12/30/2017  . Moderate recurrent major depression (HCC) 12/30/2017  . History of syphilis 12/30/2017    Rosana Hoes, PT, DPT, LAT, ATC 01/12/21  5:02 PM Phone: [phone] Fax: [phone]   [facility] Outpatient Rehabilitation [facility] [address] [place], [place], [address] Phone: [phone]   Fax:  [phone]  Name: Kenneth Hunt MRN: [id number] Date of Birth: 1980/08/31

## 2021-01-12 NOTE — Patient Instructions (Signed)
Kenneth Hunt, it was a pleasure seeing you today!  I have ordered the following labs today:  Lab Orders  No laboratory test(s) ordered today     Tests ordered today:  None ordered today  Referrals ordered today:    Referral Orders     AMB Referral to [facility] Coordin[facility]   I have ordered the following medication/changed the following medications:   Stop the following medications: Medications Discontinued During This Encounter  Medication Reason  . gabapentin (NEURONTIN) 300 MG capsule      Start the following medications: Meds ordered this encounter  Medications  . gabapentin (NEURONTIN) 400 MG capsule    Sig: Take 1 capsule (400 mg total) by mouth 3 (three) times daily.    Dispense:  90 capsule    Refill:  2     Follow-up: 6 months   Today we discussed your neuropathy in your arms. I have increased your gabapentin to 400mg  three times daily. I have also referred you to our social worker, who will give you a call in the next few days.  Please make sure to arrive 15 minutes prior to your next appointment. If you arrive late, you may be asked to reschedule.   We look forward to seeing you next time. Please call our clinic at [phone] if you have any questions or concerns. The best time to call is Monday-Friday from 9am-4pm, but there is someone available 24/7. If after hours or the weekend, call the main hospital number and ask for the Internal Medicine Resident On-Call. If you need medication refills, please notify your pharmacy one week in advance and they will send [phone] a request.  Thank you for letting us take part in your care. Wishing you the best!  Thank you, [place], MD

## 2021-01-12 NOTE — Patient Instructions (Signed)
Access Code: [email or web address] URL: [email or web address] Date: 01/12/2021 Prepared by: Rosana Hoes  Exercises Half Kneeling Hip Flexor Stretch with Chair - 1 x daily - 7 x weekly - 3 reps - 30 hold Standing Hip Flexor Stretch - 1 x daily - 7 x weekly - 2 reps - 30 hold Side Lunge Adductor Stretch - 1 x daily - 7 x weekly - 2 reps - 30 hold Supine Active Straight Leg Raise - 1 x daily - 7 x weekly - 3 sets - 10 reps Figure 4 Bridge - 1 x daily - 7 x weekly - 3 sets - 8 reps Supine Dead Bug with Leg Extension - 1 x daily - 5 x weekly - 3 sets - 10 reps Thomas Stretch on Table - 1 x daily - 5 x weekly - 2 sets - 20 reps Clamshell with Resistance - 1 x daily - 5 x weekly - 3 sets - 10 reps Hydrant with Resistance - 1 x daily - 5 x weekly - 3 sets - 10 reps Quadruped Bent Leg Hip Extension - 1 x daily - 5 x weekly - 3 sets - 10 reps Squat with Chair Touch - 1 x daily - 5 x weekly - 3 sets - 10 reps Single Leg Lunge with Foot on Bench - 1 x daily - 7 x weekly - 3 sets - 10 reps Side Stepping with Resistance at Thighs - 1 x daily - 5 x weekly - 3 sets - 20 reps

## 2021-01-13 ENCOUNTER — Telehealth: Payer: Self-pay | Admitting: *Deleted

## 2021-01-13 DIAGNOSIS — Z139 Encounter for screening, unspecified: Secondary | ICD-10-CM

## 2021-01-13 DIAGNOSIS — F1510 Other stimulant abuse, uncomplicated: Secondary | ICD-10-CM | POA: Diagnosis not present

## 2021-01-13 HISTORY — DX: Encounter for screening, unspecified: Z13.9

## 2021-01-13 NOTE — Assessment & Plan Note (Signed)
Kenneth Hunt says that he is staying in a house with others who have struggled with substance abuse. Mentions that he is having some financial strain, as he gets his checks once weekly but often runs out of money. He also is reporting that he will need to get his [place] ID, but in order to do this needs his social security card. We discussed that we have a Child psychotherapist here in the clinic that might be able to help with his housing and financial strains. He is agreeable to the consult. - Community care management referral

## 2021-01-13 NOTE — Telephone Encounter (Signed)
   Telephone encounter was:  Successful.  01/13/2021 Name: Kenneth Hunt MRN: [id number] DOB: 21-Mar-1981  Kenneth Hunt is a 40 y.o. year old male who is a primary care patient of Evlyn Kanner, MD . The community resource team was consulted for assistance with Housing   Care guide performed the following interventions: Patient provided with information about care guide support team and interviewed to confirm resource needs.Patient emailedlist for housing and other resouces.  Follow Up Plan:  No further follow up planned at this time. The patient has been provided with needed resources. Alois Cliche -[facility] Guide , Embedded Care Coordination [facility], Care Management  [phone] [address] [place] , [place] [address] Email : [email or web address]

## 2021-01-13 NOTE — Assessment & Plan Note (Signed)
Patient has been seeing Dr. Monna Fam here in the clinic as well as a psychiatrist in [facility]. He recently was re-started on Aristada. He feels as though his mood has improved since being on the medication, although sometimes feels like he's "just going through the motions." Encouraged Mr. Visser to continue going to his psychiatrist, as it appears he is making strides and is motivated to get his medical conditions under control.

## 2021-01-13 NOTE — Progress Notes (Signed)
Internal Medicine Clinic Attending ? ?Case discussed with Dr. Braswell  At the time of the visit.  We reviewed the resident?s history and exam and pertinent patient test results.  I agree with the assessment, diagnosis, and plan of care documented in the resident?s note.  ?

## 2021-01-13 NOTE — Assessment & Plan Note (Signed)
Kenneth Hunt reports compliance with Biktarvy. He states that he is due for a follow-up appointment with infectious disease soon. - Continue Biktarvy per infectious disease

## 2021-01-13 NOTE — Assessment & Plan Note (Addendum)
Mr. Muse says he is continuing to have bilateral neuropathic pain in his bilateral arms. Mentions that he has continued to work with physical therapy for this and has seen an improvement of symptoms. He has only missed a couple of sessions in total. He says the Cymbalta and gabapentin have been working well, although gabapentin doesn't work quite as well. He currently is not exhibiting adverse effects from the gabapentin. - Increase gabapentin 400mg  TID - Cymbalta 90mg  daily

## 2021-01-13 NOTE — Assessment & Plan Note (Signed)
Kenneth Hunt mentions that he has not used crystal meth in over a month. He says he is seeing a psychiatrist in [facility], although he has difficulties getting there due to transportation. He states that he has started taking Vivitrol to help with the cravings. Mentions that the cravings aren't as bad, but are still there. Encouraged him to continue following up with his psychiatrist and offered reassurance. - Follow-up with psychiatrist - Community care management referral

## 2021-01-16 DIAGNOSIS — F1520 Other stimulant dependence, uncomplicated: Secondary | ICD-10-CM | POA: Diagnosis not present

## 2021-01-16 DIAGNOSIS — F1510 Other stimulant abuse, uncomplicated: Secondary | ICD-10-CM | POA: Diagnosis not present

## 2021-01-17 ENCOUNTER — Ambulatory Visit: Payer: Medicare (Managed Care) | Admitting: Physical Therapy

## 2021-01-17 ENCOUNTER — Ambulatory Visit: Payer: Medicare (Managed Care) | Admitting: Behavioral Health

## 2021-01-18 DIAGNOSIS — F1510 Other stimulant abuse, uncomplicated: Secondary | ICD-10-CM | POA: Diagnosis not present

## 2021-01-19 ENCOUNTER — Encounter: Payer: Medicare (Managed Care) | Admitting: Physical Therapy

## 2021-01-20 DIAGNOSIS — F1510 Other stimulant abuse, uncomplicated: Secondary | ICD-10-CM | POA: Diagnosis not present

## 2021-01-23 DIAGNOSIS — F1520 Other stimulant dependence, uncomplicated: Secondary | ICD-10-CM | POA: Diagnosis not present

## 2021-01-23 DIAGNOSIS — F1510 Other stimulant abuse, uncomplicated: Secondary | ICD-10-CM | POA: Diagnosis not present

## 2021-01-24 ENCOUNTER — Ambulatory Visit: Payer: Medicare (Managed Care) | Admitting: Behavioral Health

## 2021-01-24 DIAGNOSIS — F419 Anxiety disorder, unspecified: Secondary | ICD-10-CM

## 2021-01-24 DIAGNOSIS — F331 Major depressive disorder, recurrent, moderate: Secondary | ICD-10-CM

## 2021-01-24 NOTE — BH Specialist Note (Signed)
Integrated Behavioral Health Follow Up In-Person Visit  MRN: [id number] Name: Kenneth Hunt  Number of Integrated Behavioral Health Clinician visits: 4/6 Session Start time: 3:00pm  Session End time: 3:45pm Total time: 45  minutes  Types of Service: Individual psychotherapy & Health Promotion  Interpretor:No. Interpretor Name and Language: n/a  Subjective: Kenneth Hunt is a 40 y.o. male accompanied by  self Patient was referred by Dr. Evlyn Kanner, MD for mental health issues & coping w/current circumstances of homelessness. Patient reports the following symptoms/concerns: Pt is relieved his Disability was not declined; it is cont'd until the next Review. Pt is visibly relieved.  Duration of problem: months since notification; Severity of problem: moderate  Objective: Mood: Depressed, but trending hopeful due to good news of Disability cont'tn today and Affect: Appropriate & congruent Risk of harm to self or others: No plan to harm self or others  Life Context: Family and Social: Pt is living in [facility] & is doing well. The others in the house are compliant w/the [employer]. Pt does not get triggered by unhealthy beh. Pt spends time @ [facility]. He also attends IOP Grp sessions @ [facility] 3 X weekly. Pt is utilizing resources for meals. School/Work: Pt is not working. Pt does not attend Sch @ this time.  Self-Care: Pt has been taking it slow in a new relationship. He feels he has little to offer. Discussed he has greater things to offer than money to someone of significance. Life Changes: Pt has pending Court Dates in [place] & H Pt. He has a Arts administrator to assist him.   Patient and/or Family's Strengths/Protective Factors: Social connections, Social and Emotional competence, Concrete supports in place (healthy food, safe environments, etc.), and Sense of purpose. 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 Pt is compliant  w/his Psychiatrist's orders; Kennon Holter, MD @ [facility].  Goals Addressed: Patient will:  Reduce symptoms of: anxiety, depression, and stress   Increase knowledge and/or ability of: coping skills, healthy habits, self-management skills, and stress reduction   Demonstrate ability to: Increase healthy adjustment to current life circumstances, Increase adequate support systems for patient/family, and Decrease self-medicating behaviors  Progress towards Goals: Ongoing  Interventions: Interventions utilized:  CBT Cognitive Behavioral Therapy, Medication Monitoring, and Supportive Counseling Standardized Assessments completed:  screeners prn  Patient and/or Family Response: Pt receptive to f:f visit today. Requested Pt to eval the usefulness of psychotherapy. Pt feels it is helpful & requests future visit.  Patient Centered Plan: Patient is on the following Treatment Plan(s): Pt will cont w/his resources in place. Provided Pt w/list of Food Pantries to assist w/meals, food supply. Assessment: Patient currently experiencing relief due to his Disability status. Pt is excited for new relationship, but keeping it realistic.  Patient may benefit from cont'd use of all avail resources, including his housing. Pt may want to think LT & save from his Disability checks so he is not anxious upon next scheduled review w/Disability.   Plan: Follow up with behavioral health clinician on : 2-3 wks f:f for 60 min Behavioral recommendations: Cont to be safe & keep self busy w/your usual activities. Try to re-initiate your writing; you enjoy Poetry & I am glad to share this w/you @ your own discretion. Referral(s): Integrated [employer] (In Clinic) "From scale of 1-10, how likely are you to follow plan?": 9  Deneise Lever, LMFT

## 2021-01-25 DIAGNOSIS — F1510 Other stimulant abuse, uncomplicated: Secondary | ICD-10-CM | POA: Diagnosis not present

## 2021-01-26 ENCOUNTER — Ambulatory Visit: Payer: Medicare (Managed Care) | Admitting: Physical Therapy

## 2021-01-27 DIAGNOSIS — F1510 Other stimulant abuse, uncomplicated: Secondary | ICD-10-CM | POA: Diagnosis not present

## 2021-01-30 DIAGNOSIS — F1510 Other stimulant abuse, uncomplicated: Secondary | ICD-10-CM | POA: Diagnosis not present

## 2021-01-30 DIAGNOSIS — F1520 Other stimulant dependence, uncomplicated: Secondary | ICD-10-CM | POA: Diagnosis not present

## 2021-02-01 DIAGNOSIS — F1510 Other stimulant abuse, uncomplicated: Secondary | ICD-10-CM | POA: Diagnosis not present

## 2021-02-02 ENCOUNTER — Ambulatory Visit: Payer: Medicare (Managed Care) | Admitting: Physical Therapy

## 2021-02-02 ENCOUNTER — Encounter: Payer: Self-pay | Admitting: Physical Therapy

## 2021-02-02 ENCOUNTER — Other Ambulatory Visit: Payer: Self-pay

## 2021-02-02 DIAGNOSIS — M6281 Muscle weakness (generalized): Secondary | ICD-10-CM

## 2021-02-02 DIAGNOSIS — M25551 Pain in right hip: Secondary | ICD-10-CM

## 2021-02-02 DIAGNOSIS — M25552 Pain in left hip: Secondary | ICD-10-CM

## 2021-02-02 NOTE — Therapy (Addendum)
[place], [place], [address] Phone: [phone]   Fax:  [phone]  Physical Therapy Treatment / ERO / Discharge  Patient Details  Name: Kenneth Hunt MRN: [id number] Date of Birth: 05/05/1981 Referring Provider (PT): Aldine Contes, MD   Encounter Date: 02/02/2021   PT End of Session - 02/02/21 1449     Visit Number 7    Number of Visits 13    Date for PT Re-Evaluation 03/16/21    Authorization Type MCR: kx mod 15th visit FOTO 11 visit.    Progress Note Due on Visit 10    PT Start Time 1445    PT Stop Time 1525    PT Time Calculation (min) 40 min    Activity Tolerance Patient tolerated treatment well    Behavior During Therapy [facility] for tasks assessed/performed             Past Medical History:  Diagnosis Date   Anxiety    Brachial plexus disorders 07/31/2018   Seen by [facility] neurology. MRI brain and cervical spine showed bilateral neural foraminal cysts C4-C5, C5-C6, C6-C7, brain within normal limits.  - continue cymbalta, flexeril, neurontin - f/u with Dr. Elana Alm [facility] neurology 03/28/19   Depression    History of syphilis 12/30/2017   RPR 1:1 12/25/17 s/p adequate treatment   HIV (human immunodeficiency virus infection) ([place])    TOS (thoracic outlet syndrome)     Past Surgical History:  Procedure Laterality Date   tos surgery      There were no vitals filed for this visit.   Subjective Assessment - 02/02/21 1447     Subjective Patient reports he is doing well, he did notice some pain about 3 days ago in right hip with extended periods of walking and doing bicycle sit-ups, but currently not having any pain. Overall he feels it has gotten better, he expects that he will have flare ups but states it is not as bad as it was. Pain continues to be located deep within hip.    How long can you walk comfortably? Patient reports he can walk 20 minutes then pain  increased to 5-6/10, so he slows his walking speed    Patient Stated Goals to decrease pain    Currently in Pain? No/denies    Pain Location Hip    Pain Orientation Right                [facility] PT Assessment - 02/02/21 0001       Assessment   Medical Diagnosis Acute hip pain, bilateral    Referring Provider (PT) Aldine Contes, MD      Precautions   Precautions None      Restrictions   Weight Bearing Restrictions No      Balance Screen   Has the patient fallen in the past 6 months No      Prior Function   Level of Independence Independent      Cognition   Overall Cognitive Status Within Functional Limits for tasks assessed      Observation/Other Assessments   Focus on Therapeutic Outcomes (FOTO)  57% functional status      PROM   Overall PROM Comments Right hip PROM grossly WFL except slighty limited hip IR, no pain noted at end ranges this visit      Strength   Right Hip Flexion 4+/5    Right Hip Extension 4/5    Right Hip ABduction 4/5  Palpation   Palpation comment Non-TTP                           [facility] Adult PT Treatment/Exercise - 02/02/21 0001       Self-Care   Self-Care Other Self-Care Comments    Other Self-Care Comments  POC update, FOTO, progress toward goals, possible etiology of continued symptoms      Exercises   Exercises Knee/Hip      Knee/Hip Exercises: Aerobic   Nustep L5 x 5 min with LE while taking subjective      Knee/Hip Exercises: Standing   Other Standing Knee Exercises Rear foot elevated split squat 2 x 10 each      Knee/Hip Exercises: Supine   Other Supine Knee/Hip Exercises Eccentric hip flexor into thomas position 2 x 20 each    Other Supine Knee/Hip Exercises Dead bug 2 x 20      Knee/Hip Exercises: Sidelying   Other Sidelying Knee/Hip Exercises Side planl x 30 sec each      Knee/Hip Exercises: Prone   Other Prone Exercises Front plank x 30 sec                    PT Education -  02/02/21 1449     Education Details HEP, FOTO    Person(s) Educated Patient    Methods Explanation;Demonstration;Verbal cues    Comprehension Verbalized understanding;Returned demonstration;Verbal cues required;Need further instruction              PT Short Term Goals - 12/19/20 1619       PT SHORT TERM GOAL #1   Title pt to be IND with inital HEP    Period Weeks    Status Achieved      PT SHORT TERM GOAL #2   Title capture FOTO on second visit and update goals.    Period Weeks    Status Achieved               PT Long Term Goals - 02/02/21 1453       PT LONG TERM GOAL #1   Title increase gross R hip strength to >/=4+/5 to promote hip stability to reduce popping with gait    Baseline hip strength grossly 4/5 MMT    Time 6    Period Weeks    Status On-going    Target Date 03/16/21      PT LONG TERM GOAL #2   Title pt to be able to stand and walk for >/= 2 hours with no report of popping and max pain of </= 2/10    Baseline Patient reports about 20 minutes then hip pain increased to 5-6/10    Time 6    Period Weeks    Status On-going    Target Date 03/16/21      PT LONG TERM GOAL #3   Title pt to be IND with all HEP and is able to maintain and progress current LOF IND    Baseline progressing with HEP    Time 6    Period Weeks    Status On-going    Target Date 03/16/21      PT LONG TERM GOAL #4   Title increase FOTO score to >/=60% to demo improvement in function.    Baseline 46%    Time 6    Period Weeks    Status On-going    Target Date 03/16/21  Plan - 02/02/21 1450     Clinical Impression Statement Patient tolerated therapy well with no adverse effects. He seems to be progressing toward established goals, exhibiting improvement in hip strength and reporting improved functional status on FOTO. He does continue to report limitations and increased pain with walking tolerance. His pain continues to be deep in the right hip  and seems consistent with possible labral pathology. Updated his HEP this visit to focus on continued hip strength. Patient would benefit from continued skilled PT to reduce pain and progress strength in order to improve walking and maximize functional ability.    Examination-Activity Limitations --    PT Frequency 1x / week    PT Duration 6 weeks    PT Treatment/Interventions ADLs/Self Care Home Management;Electrical Stimulation;Cryotherapy;Iontophoresis 76m/ml Dexamethasone;Moist Heat;Traction;Ultrasound;Gait training;Stair training;Functional mobility training;Therapeutic activities;Therapeutic exercise;Balance training;Neuromuscular re-education;Manual techniques;Dry needling;Passive range of motion;Taping;Patient/family education    PT Next Visit Plan Review and update HEP as needed, continue focus on hip strengthening and lumbopelvic control    PT Home Exercise Plan [id number]    Consulted and Agree with Plan of Care Patient             Patient will benefit from skilled therapeutic intervention in order to improve the following deficits and impairments:  Improper body mechanics, Decreased strength, Increased muscle spasms, Postural dysfunction, Pain, Decreased activity tolerance, Decreased endurance  Visit Diagnosis: Pain in right hip  Pain in left hip  Muscle weakness (generalized)     Problem List Patient Active Problem List   Diagnosis Date Noted   Encounter for screening involving social determinants of health (SDoH) 01/13/2021   Amphetamine and psychostimulant-induced psychosis with delusions ([place]    Healthcare maintenance 10/17/2020   Hip pain, bilateral 10/17/2020   Injection of illicit drug within last 12 months 08/26/2019   Bipolar 2 disorder ([place] 03/04/2019   Human immunodeficiency virus (HIV) disease ([facility] 10/01/2018   Thoracic outlet syndrome 10/01/2018   Brachial plexus disorders 07/31/2018   Methamphetamine dependence ([place] 12/30/2017   Moderate recurrent  major depression ([place] 12/30/2017   History of syphilis 12/30/2017    CHilda Blades PT, DPT, LAT, ATC 02/02/21  4:43 PM Phone: [phone]: 3[facility] [place] [id number]: [phone]  Fax:  [phone] Name: CAudry PecinaMRN: [id number] of Birth: 4December 04, 1982    PHYSICAL THERAPY DISCHARGE SUMMARY  Visits from Start of Care: 7  Current functional level related to goals / functional outcomes: See goals   Remaining deficits: Current status unknown   Education / Equipment: HEP   Patient agrees to discharge. Patient goals were partially met. Patient is being discharged due to not returning since the last visit.

## 2021-02-06 ENCOUNTER — Ambulatory Visit (INDEPENDENT_AMBULATORY_CARE_PROVIDER_SITE_OTHER): Payer: Medicare (Managed Care)

## 2021-02-06 ENCOUNTER — Ambulatory Visit (INDEPENDENT_AMBULATORY_CARE_PROVIDER_SITE_OTHER): Payer: Medicare (Managed Care) | Admitting: Infectious Diseases

## 2021-02-06 ENCOUNTER — Encounter: Payer: Self-pay | Admitting: Infectious Diseases

## 2021-02-06 ENCOUNTER — Other Ambulatory Visit: Payer: Self-pay

## 2021-02-06 DIAGNOSIS — Z8619 Personal history of other infectious and parasitic diseases: Secondary | ICD-10-CM | POA: Diagnosis not present

## 2021-02-06 DIAGNOSIS — A539 Syphilis, unspecified: Secondary | ICD-10-CM

## 2021-02-06 DIAGNOSIS — F1990 Other psychoactive substance use, unspecified, uncomplicated: Secondary | ICD-10-CM

## 2021-02-06 DIAGNOSIS — Z7182 Exercise counseling: Secondary | ICD-10-CM | POA: Diagnosis not present

## 2021-02-06 DIAGNOSIS — Z23 Encounter for immunization: Secondary | ICD-10-CM | POA: Diagnosis not present

## 2021-02-06 DIAGNOSIS — B20 Human immunodeficiency virus [HIV] disease: Secondary | ICD-10-CM

## 2021-02-06 DIAGNOSIS — Z7185 Encounter for immunization safety counseling: Secondary | ICD-10-CM

## 2021-02-06 MED ORDER — BIKTARVY 50-200-25 MG PO TABS: 1.0000 | ORAL_TABLET | Freq: Every day | ORAL | 5 refills | Status: DC

## 2021-02-06 NOTE — Progress Notes (Signed)
   Covid-19 Vaccination Clinic  Name:  Lakota Markgraf    MRN: [id number] DOB: 20-Jun-1981  02/06/2021  Mr. Durley was observed post Covid-19 immunization for 15 minutes without incident. He was provided with Vaccine Information Sheet and instruction to access the V-Safe system.   Mr. Bernardini was instructed to call 911 with any severe reactions post vaccine: Difficulty breathing  Swelling of face and throat  A fast heartbeat  A bad rash all over body  Dizziness and weakness   Immunizations Administered     Name Date Dose VIS Date Route   PFIZER Comrnaty(Gray TOP) Covid-19 Vaccine 02/06/2021  3:10 PM 0.3 mL 07/21/2020 Intramuscular   Manufacturer: [employer], [employer]   Lot: [id number]   NDC: [phone]

## 2021-02-06 NOTE — Patient Instructions (Signed)
Please continue your Biktarvy ever day   Stop by the lab on your way out to update your labs.   Please make sure we have the correct phone number for you so we can call you when they result.   We gave you your pfizer booster today. I would expect some soreness in the arm you get it. Can do ice to the arm if it is sore. A few days of ibuprofen (3 tabs twice a day with food) is OK and should not hurt your kidneys. If you wanted to try tylenol first instead to see if that helps that's fine too.   Follow up in 4-6 months. We can do your labs ahead of time if you like so we have results available same day as our visit.

## 2021-02-06 NOTE — Progress Notes (Signed)
Name: Kenneth Hunt  DOB: 24-Apr-1981 MRN: [id number] PCP: Sanjuan Dame, MD     Brief Narrative:  Kenneth Hunt is a 40 y.o. male with well controlled HIV. Dx   Transferred care from [place] in [place]  [phone])  VL < 20 recently with CD4 ~340 on Biktarvy  HIV Risk: MSM History of OIs: none known  Intake Labs 07/23/2019: Hep B sAg (-), sAb (-), cAb (-); Hep A (immune), Hep C (-) Quantiferon (-) HLA B*5701 (-) G6PD: () Toxo IgG: (-)   Previous Regimens: Biktarvy   Genotypes: None on record   Subjective:   Chief Complaint  Patient presents with   Follow-up      HPI: Here today for routine follow-up care.  Kenneth Hunt has been doing very well over the last interval since her last office visit.  He has completed drug rehab program and has been sober for over 2 months now.  He is planned to live in a halfway house continue to work.  He states it is very hard and difficult every day but so far it has been worth it.  Has noted some weight gain since he stopped using meth.  Wonders if he should try to lose some weight placement health risk.  Kenneth Hunt got his lab results from February when he had a one-time appointment at [facility] clinic. In a new relationship with male partner. No STI exposures or symptoms.   Working with his PCP team.  Participating in physical therapy for his hip.  Has been somewhat helpful.   Is received the [employer] shot once but no further COVID-vaccine boosters.  He is very leery about the mRNA vaccine but open to discussing it.   Review of Systems  Constitutional:  Negative for appetite change, chills, fatigue, fever and unexpected weight change.  Eyes:  Negative for visual disturbance.  Respiratory:  Negative for cough and shortness of breath.   Cardiovascular:  Negative for chest pain and leg swelling.  Gastrointestinal:  Negative for abdominal pain, diarrhea and nausea.  Genitourinary:   Negative for dysuria, genital sores and penile discharge.  Musculoskeletal:  Negative for joint swelling.  Skin:  Negative for color change and rash.  Neurological:  Negative for dizziness and headaches.  Hematological:  Negative for adenopathy.  Psychiatric/Behavioral:  Negative for sleep disturbance. The patient is not nervous/anxious.    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 MRI brain and cervical spine showed bilateral neural foraminal cysts C4-C5, C5-C6, C6-C7, brain within normal limits.  - continue cymbalta, flexeril, neurontin - f/u with Dr. Elana Alm [facility] neurology 03/28/19   Depression    History of syphilis 12/30/2017   RPR 1:1 12/25/17 s/p adequate treatment   HIV (human immunodeficiency virus infection) ([place])    TOS (thoracic outlet syndrome)     Outpatient Medications Prior to Visit  Medication Sig Dispense Refill   acetaminophen (TYLENOL) 500 MG tablet Take 1 tablet (500 mg total) by mouth every 6 (six) hours as needed. 30 tablet 0   DULoxetine (CYMBALTA) 30 MG capsule Take 3 capsules (90 mg total) by mouth daily. 90 capsule 2   gabapentin (NEURONTIN) 400 MG capsule Take 1 capsule (400 mg total) by mouth 3 (three) times daily. 90 capsule 2   hydrOXYzine (ATARAX/VISTARIL) 25 MG tablet Take 1 tablet (25 mg total) by mouth 3 (three) times daily as needed. [place]  tablet 0   bictegravir-emtricitabine-tenofovir AF (BIKTARVY) 50-200-25 MG TABS tablet Take 1 tablet by mouth daily. 30 tablet 5   bictegravir-emtricitabine-tenofovir AF (BIKTARVY) 50-200-25 MG TABS tablet TAKE 1 TABLET BY MOUTH DAILY. 30 tablet 0   triamcinolone ointment (KENALOG) 0.5 % Apply 1 application topically 2 (two) times daily. (Patient not taking: Reported on 02/06/2021) 30 g 0   No facility-administered medications prior to visit.     Allergies  Allergen Reactions   Haloperidol Other (See Comments)    Tardive  dyskinesia    Social History   Tobacco Use   Smoking status: Never   Smokeless tobacco: Never  Vaping Use   Vaping Use: Never used  Substance Use Topics   Alcohol use: Not Currently   Drug use: Yes    Types: IV, Methamphetamines    Comment: last used last nght    Social History   Substance and Sexual Activity  Sexual Activity Yes   Partners: Male   Comment: declined condoms; 1/21     Objective:   Vitals:   02/06/21 1442  BP: 125/83  Pulse: 98  Temp: 98.2 F (36.8 C)  TempSrc: Oral  SpO2: 97%  Weight: 173 lb 4 oz (78.6 kg)   Body mass index is 30.69 kg/m.  Physical Exam Vitals reviewed.  Constitutional:      Appearance: He is well-developed.     Comments: Seated comfortably in chair during visit.   HENT:     Mouth/Throat:     Dentition: Normal dentition. No dental abscesses.  Cardiovascular:     Rate and Rhythm: Normal rate and regular rhythm.     Heart sounds: Normal heart sounds.  Pulmonary:     Effort: Pulmonary effort is normal.     Breath sounds: Normal breath sounds.  Abdominal:     General: There is no distension.     Palpations: Abdomen is soft.     Tenderness: There is no abdominal tenderness.  Lymphadenopathy:     Cervical: No cervical adenopathy.  Skin:    General: Skin is warm and dry.     Findings: No rash.  Neurological:     Mental Status: He is alert and oriented to person, place, and time.  Psychiatric:        Judgment: Judgment normal.     Comments: In good spirits today and engaged in care discussion.     Lab Results Lab Results  Component Value Date   WBC 14.6 (H) 11/12/2020   HGB 14.5 11/12/2020   HCT 43.3 11/12/2020   MCV 93.5 11/12/2020   PLT 240 11/12/2020    Lab Results  Component Value Date   CREATININE 1.07 11/12/2020   BUN 16 11/12/2020   NA 139 11/12/2020   K 5.4 (H) 11/12/2020   CL 107 11/12/2020   CO2 23 11/12/2020    Lab Results  Component Value Date   ALT 32 11/12/2020   AST 32 11/12/2020    ALKPHOS 67 11/12/2020   BILITOT 0.9 11/12/2020    Lab Results  Component Value Date   CHOL 155 07/23/2019   HDL 55 07/23/2019   LDLCALC 85 07/23/2019   TRIG 60 07/23/2019   CHOLHDL 2.8 07/23/2019   HIV 1 RNA Quant  Date Value  06/27/2020 <20 Copies/mL (H)  04/06/2020 <20 Copies/mL (H)  12/17/2019 <20 NOT DETECTED copies/mL   CD4 T Cell Abs (/uL)  Date Value  02/06/2021 595  06/27/2020 859  04/06/2020 689     Assessment & Plan:  Problem List Items Addressed This Visit       High   Human immunodeficiency virus (HIV) disease ([place]) (Chronic)    Doing very well on once daily Biktarvy.  Recent viral load has been undetectable in February 2022.  CD4 count has been stable, 700-900.  Continue Biktarvy once daily. Politely declined further STI screening today.  He did except condoms. We will have him return to clinic in 4 to 6 months for routine follow-up care pertinent to HIV.        Relevant Medications   bictegravir-emtricitabine-tenofovir AF (BIKTARVY) 50-200-25 MG TABS tablet   Other Relevant Orders   HIV-1 RNA quant-no reflex-bld   T-helper cell (CD4)- ([facility] clinic only) (Completed)   RPR (Completed)     Medium   Injection of illicit drug within last 12 months    Following liver function tests for any abnormalities indicating acquisition of hepatitis.  He is vaccinated against hepatitis B with protective immunity.  I will check hepatitis C antibody screen at next lab draw. 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         Unprioritized   Vaccine counseling    Spent a significant time talking about the recommendations to boost antibody production for COVID with mRNA vaccine following J&J.  After time spent discussing and answering questions he agreed to receive a dose today.  We talked about side effect management and things that would be concerning to call to report.  Did well during the 15-minute monitoring without issues and patient was released from the  visit.       RESOLVED: Syphilis   Relevant Medications   bictegravir-emtricitabine-tenofovir AF (BIKTARVY) 50-200-25 MG TABS tablet   History of syphilis    We will repeat RPR today for therapeutic monitoring of past or new infection.        Janene Madeira, MSN, NP-C [facility] [place] Pager: [phone] Office: [phone]  02/08/21  9:37 AM

## 2021-02-07 LAB — T-HELPER CELL (CD4) - (RCID CLINIC ONLY)
CD4 % Helper T Cell: 32 % — ABNORMAL LOW (ref 33–65)
CD4 T Cell Abs: 595 /uL (ref 400–1790)

## 2021-02-08 ENCOUNTER — Other Ambulatory Visit: Payer: Self-pay | Admitting: Family

## 2021-02-08 DIAGNOSIS — B20 Human immunodeficiency virus [HIV] disease: Secondary | ICD-10-CM

## 2021-02-08 DIAGNOSIS — Z7185 Encounter for immunization safety counseling: Secondary | ICD-10-CM | POA: Insufficient documentation

## 2021-02-08 LAB — HIV-1 RNA QUANT-NO REFLEX-BLD
HIV 1 RNA Quant: <20 Copies/mL — ABNORMAL HIGH
HIV-1 RNA Quant, Log: <1.3 Log cps/mL — ABNORMAL HIGH

## 2021-02-08 LAB — RPR: RPR Ser Ql: NONREACTIVE

## 2021-02-08 NOTE — Assessment & Plan Note (Deleted)
We will repeat RPR today for therapeutic monitoring of past or new infection.  

## 2021-02-08 NOTE — Assessment & Plan Note (Signed)
Doing very well on once daily Biktarvy.  Recent viral load has been undetectable in February 2022.  CD4 count has been stable, 700-900.  Continue Biktarvy once daily. Politely declined further STI screening today.  He did except condoms. We will have him return to clinic in 4 to 6 months for routine follow-up care pertinent to HIV.

## 2021-02-08 NOTE — Assessment & Plan Note (Signed)
>>  ASSESSMENT AND PLAN FOR INJECTION OF ILLICIT DRUG WITHIN LAST 12 MONTHS WRITTEN ON 02/08/2021  9:37 AM BY DIXON, STEPHANIE N, NP  Following liver function tests for any abnormalities indicating acquisition of hepatitis.  He is vaccinated against hepatitis B with protective immunity.  I will check hepatitis C antibody screen at next lab draw. 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

## 2021-02-08 NOTE — Assessment & Plan Note (Signed)
We will repeat RPR today for therapeutic monitoring of past or new infection.

## 2021-02-08 NOTE — Assessment & Plan Note (Signed)
Spent a significant time talking about the recommendations to boost antibody production for COVID with mRNA vaccine following J&J.  After time spent discussing and answering questions he agreed to receive a dose today.  We talked about side effect management and things that would be concerning to call to report.  Did well during the 15-minute monitoring without issues and patient was released from the visit.

## 2021-02-08 NOTE — Assessment & Plan Note (Signed)
Following liver function tests for any abnormalities indicating acquisition of hepatitis.  He is vaccinated against hepatitis B with protective immunity.  I will check hepatitis C antibody screen at next lab draw. 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

## 2021-02-09 ENCOUNTER — Encounter: Payer: Self-pay | Admitting: Infectious Diseases

## 2021-02-09 ENCOUNTER — Ambulatory Visit: Payer: Medicare (Managed Care) | Admitting: Physical Therapy

## 2021-02-09 ENCOUNTER — Ambulatory Visit: Payer: Medicare (Managed Care) | Admitting: Behavioral Health

## 2021-02-09 DIAGNOSIS — F419 Anxiety disorder, unspecified: Secondary | ICD-10-CM

## 2021-02-09 DIAGNOSIS — F3181 Bipolar II disorder: Secondary | ICD-10-CM

## 2021-02-09 NOTE — BH Specialist Note (Addendum)
Integrated Behavioral Health via Telemedicine Visit  02/09/2021 Kenneth Hunt [id number]  Number of Integrated Behavioral Health visits: 5/6 Session Start time: 2:00pm  Session End time: 2:30pm Total time: 30  Referring Provider: Dr. Evlyn Kanner, MD Patient/Family location: Pt in housing in private [place] Provider location: [facility] Office All persons participating in visit: Pt & Clinician Types of Service: Individual psychotherapy  I connected with Kenneth Hunt and/or Kenneth Hunt's  self  via  Telephone or Video Enabled Telemedicine Application  (Video is Caregility application) and verified that I am speaking with the correct person using two identifiers. Discussed confidentiality: Yes   I discussed the limitations of telemedicine and the availability of in person appointments.  Discussed there is a possibility of technology failure and discussed alternative modes of communication if that failure occurs.  I discussed that engaging in this telemedicine visit, they consent to the provision of behavioral healthcare and the services will be billed under their insurance.  Patient and/or legal guardian expressed understanding and consented to Telemedicine visit: Yes   Presenting Concerns: Patient and/or family reports the following symptoms/concerns: elevated concerns for upcoming Court Date on 02/27/21. He has completed his IOP @ [facility] & is dating. Duration of problem: years; Severity of problem: moderate  Patient and/or Family's Strengths/Protective Factors: Social connections, Social and Emotional competence, Concrete supports in place (healthy food, safe environments, etc.), and Physical Health (exercise, healthy diet, medication compliance, etc.)  Goals Addressed: Patient will:  Reduce symptoms of: anxiety, depression, and stress   Increase knowledge and/or ability of: coping skills, healthy habits, and stress reduction   Demonstrate ability to: Increase healthy  adjustment to current life circumstances  Progress towards Goals: Ongoing  Interventions: Interventions utilized:  Solution-Focused Strategies and Supportive Counseling Standardized Assessments completed:  screeners prn  Patient and/or Family Response: Pt receptive to call today & requests  Assessment: Patient currently experiencing reduced anx/dep due to positive steps he is taking for his Recovery Support/Relapse Prevention efforts. Pt has secured his Birth Cert through his Str in [place], he has a cc of his [employer], & his [place] id is on the way. 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 He is also taking the written/computerized portion of the Driver's Test in one mos.  Pt has completed the IOP @ [facility].  Patient may benefit from cont'd support for his efforts, goals, & input on relational issues w/new Partner.  Plan: Follow up with behavioral health clinician on : after 02/27/21 Court Date Behavioral recommendations: Stay the positive course & prevent risk of relapse through shifting your mindset to inc'd healthy boundaries for self. Referral(s): Integrated [employer] (In Clinic)  I discussed the assessment and treatment plan with the patient and/or parent/guardian. They were provided an opportunity to ask questions and all were answered. They agreed with the plan and demonstrated an understanding of the instructions.   They were advised to call back or seek an in-person evaluation if the symptoms worsen or if the condition fails to improve as anticipated.  Deneise Lever, LMFT

## 2021-02-09 NOTE — Progress Notes (Signed)
Please call Kenneth Hunt to let him know that his viral load is undetectable and his CD4 is right around 600. No changes from me - continue his biktarvy everyday and working on his health.

## 2021-02-10 ENCOUNTER — Telehealth: Payer: Self-pay

## 2021-02-10 ENCOUNTER — Telehealth: Payer: Self-pay | Admitting: Physical Therapy

## 2021-02-10 NOTE — Telephone Encounter (Signed)
-----   Message from Blanchard Kelch, NP sent at 02/09/2021  7:26 PM EDT ----- Please call Kenneth Hunt to let him know that his viral load is undetectable and his CD4 is right around 600. No changes from me - continue his biktarvy everyday and working on his health.

## 2021-02-10 NOTE — Telephone Encounter (Signed)
Advised patient his viral load is undetectable and CD4 count is around 600 which is exactly what we want to see and that Judeth Cornfield would like for him to continue his Biktarvy daily. Patient verbalized understanding and has no further questions.   Sandie Ano, RN

## 2021-02-10 NOTE — Telephone Encounter (Signed)
Attempted to contact patient due to missed PT appointment. Left VM informing patient of missed appointment and of his next scheduled appointment on 02/16/2021. Patient reminded of attendance policy and that he has multiple no-shows, advised we will not cancel any appointments at this time, but any further no-shows would be grounds for discharge.  Rosana Hoes, PT, DPT, LAT, ATC 02/10/21  9:05 AM Phone: [phone] Fax: [phone]

## 2021-02-14 DIAGNOSIS — F1120 Opioid dependence, uncomplicated: Secondary | ICD-10-CM | POA: Diagnosis not present

## 2021-02-14 DIAGNOSIS — F3132 Bipolar disorder, current episode depressed, moderate: Secondary | ICD-10-CM | POA: Diagnosis not present

## 2021-02-14 DIAGNOSIS — F1510 Other stimulant abuse, uncomplicated: Secondary | ICD-10-CM | POA: Diagnosis not present

## 2021-02-16 ENCOUNTER — Ambulatory Visit: Payer: Medicare (Managed Care) | Attending: Internal Medicine | Admitting: Physical Therapy

## 2021-02-23 ENCOUNTER — Ambulatory Visit: Payer: Medicare (Managed Care) | Admitting: Physical Therapy

## 2021-02-23 ENCOUNTER — Telehealth: Payer: Self-pay | Admitting: Physical Therapy

## 2021-02-23 NOTE — Telephone Encounter (Signed)
Attempted to contact patient due to missed PT appointment. Left VM informing patient of missed appointment and advised patient that he would be discharged from PT due to attendance policy, he will need new PT referral if he would like to return to therapy.  Rosana Hoes, PT, DPT, LAT, ATC 02/23/21  3:57 PM Phone: [phone] Fax: [phone]

## 2021-03-02 ENCOUNTER — Encounter: Payer: Medicare (Managed Care) | Admitting: Physical Therapy

## 2021-03-06 ENCOUNTER — Emergency Department (HOSPITAL_COMMUNITY): Payer: Medicare (Managed Care)

## 2021-03-06 ENCOUNTER — Emergency Department (HOSPITAL_COMMUNITY)
Admission: EM | Admit: 2021-03-06 | Discharge: 2021-03-07 | Disposition: A | Discharge status: Home / Self Care | Payer: Medicare (Managed Care) | Attending: Student | Admitting: Student

## 2021-03-06 DIAGNOSIS — S299XXA Unspecified injury of thorax, initial encounter: Secondary | ICD-10-CM | POA: Diagnosis not present

## 2021-03-06 DIAGNOSIS — Z5321 Procedure and treatment not carried out due to patient leaving prior to being seen by health care provider: Secondary | ICD-10-CM | POA: Diagnosis not present

## 2021-03-06 DIAGNOSIS — R0789 Other chest pain: Secondary | ICD-10-CM | POA: Diagnosis not present
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
## 2021-03-06 NOTE — ED Notes (Signed)
Called pt x3. No answer.  
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
## 2021-03-07 ENCOUNTER — Ambulatory Visit: Payer: Medicare (Managed Care) | Admitting: Behavioral Health

## 2021-03-07 ENCOUNTER — Other Ambulatory Visit: Payer: Self-pay

## 2021-03-07 DIAGNOSIS — F3181 Bipolar II disorder: Secondary | ICD-10-CM

## 2021-03-07 DIAGNOSIS — F419 Anxiety disorder, unspecified: Secondary | ICD-10-CM

## 2021-03-07 DIAGNOSIS — F331 Major depressive disorder, recurrent, moderate: Secondary | ICD-10-CM

## 2021-03-07 NOTE — BH Specialist Note (Signed)
Integrated Behavioral Health via Telemedicine Visit  03/07/2021 Kenneth Hunt [id number]  Number of Integrated Behavioral Health visits: 6/6 Session Start time: 10:30am  Session End time: 11:00am Total time: 30  Referring Provider: Dr. Evlyn Kanner, MD Patient/Family location: Pt is @ [facility] in private space [facility] Provider location: [facility] Office All persons participating in visit: Pt & Clinician Types of Service: Individual psychotherapy  I connected with Kenneth Hunt and/or Kenneth Hunt's  self  via  Telephone or Video Enabled Telemedicine Application  (Video is Caregility application) and verified that I am speaking with the correct person using two identifiers. Discussed confidentiality:  6th visit  I discussed the limitations of telemedicine and the availability of in person appointments.  Discussed there is a possibility of technology failure and discussed alternative modes of communication if that failure occurs.  I discussed that engaging in this telemedicine visit, they consent to the provision of behavioral healthcare and the services will be billed under their insurance.  Patient and/or legal guardian expressed understanding and consented to Telemedicine visit:  6th visit  Presenting Concerns: Patient and/or family reports the following symptoms/concerns: elevated dep due to end of unhealthy relationship Duration of problem: months; Severity of problem: mild  Patient and/or Family's Strengths/Protective Factors: Social connections, Social and Emotional competence, Concrete supports in place (healthy food, safe environments, etc.), Sense of purpose, and Pt resilience factors  Goals Addressed: Patient will:  Reduce symptoms of: anxiety, depression, and stress; Pt is basically homeless, but is staying @ [address]  Increase knowledge and/or ability of: healthy habits, self-management skills, and stress reduction   Demonstrate ability to:  Increase healthy adjustment to current life circumstances, Increase adequate support systems for patient/family, and Decrease self-medicating behaviors-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 He can cut one year off his Probation if he abides by the Rx Testing requirements & Spontaneous Searches. He is determined to do this. 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 He is happy his Disability is in tact, although the [employer] is permanent, he is positive about keeping busy so he does not return to using. His former Partner of one month has ended the relationship angrily.  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   Progress towards Goals: Ongoing  Interventions: Interventions utilized:  Motivational Interviewing, Solution-Focused Strategies, and Supportive Counseling Standardized Assessments completed:  screeners prn  Patient and/or Family Response: Pt receptive to call today & requests future ck-in appt on telehealth.   Assessment: Patient currently experiencing acceptance of the Court's dec to enforce the penalties for the [facility] Charges. Pt humble about doing what he needs to do to end the Probation sooner. Pt has many future-oriented plans to stay in his best health.   Patient may benefit from cont'd support & telehealth sessions as he moves through this transition.  Plan: Follow up with behavioral health clinician on : 2-3 wks on tlehealth for 30 min Behavioral recommendations: cont to treat yourself well & think about what you want in relationships Referral(s): Integrated [employer] (In Clinic)  I discussed the assessment and treatment plan with the patient and/or parent/guardian. They were provided an opportunity to ask questions and all were answered. They agreed with the plan and  demonstrated an understanding of the  instructions.   They were advised to call back or seek an in-person evaluation if the symptoms worsen or if the condition fails to improve as anticipated.  Deneise Lever, LMFT

## 2021-03-29 ENCOUNTER — Ambulatory Visit: Payer: Medicare (Managed Care) | Admitting: Behavioral Health

## 2021-03-29 DIAGNOSIS — F3181 Bipolar II disorder: Secondary | ICD-10-CM

## 2021-03-29 NOTE — BH Specialist Note (Signed)
Integrated Behavioral Health via Telemedicine Visit  03/29/2021 Kenneth Hunt [id number]  Number of Integrated Behavioral Health visits: 7 Session Start time: 2:30pm  Session End time: 3:00pm Total time: 30  Referring Provider: Dr. Evlyn Kanner, MD Patient/Family location: Pt is walking to [place] [facility] Provider location: [facility] Office All persons participating in visit: Pt & Clinician Types of Service: Individual psychotherapy  I connected with Kenneth Hunt and/or Kenneth Hunt's  self  via  Telephone or Video Enabled Telemedicine Application  (Video is Caregility application) and verified that I am speaking with the correct person using two identifiers. Discussed confidentiality:  7 visit  I discussed the limitations of telemedicine and the availability of in person appointments.  Discussed there is a possibility of technology failure and discussed alternative modes of communication if that failure occurs.  I discussed that engaging in this telemedicine visit, they consent to the provision of behavioral healthcare and the services will be billed under their insurance.  Patient and/or legal guardian expressed understanding and consented to Telemedicine visit:  7th visit  Presenting Concerns: Patient and/or family reports the following symptoms/concerns: good progress w/the process of Probation; his male PO is understanding, just wanting him to be honest about Rx use if his Recovery/Relapse Prevention is compromised.  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 which Pt is handling well; Severity of problem: moderate w/no reported return to use events  Patient and/or Family's Strengths/Protective Factors: Social connections, Social and Emotional competence, Concrete supports in place (healthy food, safe environments, etc.), Sense of purpose, and Physical Health (exercise, healthy diet, medication compliance,  etc.)  Goals Addressed: Patient will:  Reduce symptoms of: anxiety and depression   Increase knowledge and/or ability of: coping skills and healthy habits   Demonstrate ability to: Increase healthy adjustment to current life circumstances  Progress towards Goals: Ongoing  Interventions: Interventions utilized:  Solution-Focused Strategies, Behavioral Activation, and Supportive Counseling Standardized Assessments completed: Not Needed  Patient and/or Family Response: Pt is receptive to call today & agrees to once monthly telehealth calls & ability of Pt to request more frequency if needed  Assessment: Patient currently experiencing improved outcomes for Recovery/Relapse Prevention. Pt has completed his time @ [facility] & fulfilled mandatory processes for his charges. Pt c/o being lonely & having to redirect this energy @ times. Pt is still living @ [employer] in private room & doing well. Pt is taking advantage of the opportunities @ [facility] to learn, exp & express himself w/others.   Patient may benefit from once monthly ck-ins & further Cslg availabilty prn.  Plan: Follow up with behavioral health clinician on : one mos for 30 min telehealth ck-in Behavioral recommendations: Cont your routine & connect w/others. Stay healthy & care for needs. Be honest w/the PO as she has gained trust in you & is using her discretion which promotes greater freedom for you.  Referral(s): Integrated [employer] (In Clinic)  I discussed the assessment and treatment plan with the patient and/or parent/guardian. They were provided an opportunity to ask questions and all were answered. They agreed with the plan and demonstrated an understanding of the instructions.   They were advised to call back or seek an in-person evaluation if the symptoms worsen or if the condition fails to improve as anticipated.  Deneise Lever, LMFT

## 2021-04-06 ENCOUNTER — Encounter: Payer: Self-pay | Admitting: Infectious Diseases

## 2021-04-11 ENCOUNTER — Telehealth: Payer: Self-pay

## 2021-04-11 DIAGNOSIS — F1120 Opioid dependence, uncomplicated: Secondary | ICD-10-CM | POA: Diagnosis not present

## 2021-04-11 DIAGNOSIS — F1510 Other stimulant abuse, uncomplicated: Secondary | ICD-10-CM | POA: Diagnosis not present

## 2021-04-11 DIAGNOSIS — F3132 Bipolar disorder, current episode depressed, moderate: Secondary | ICD-10-CM | POA: Diagnosis not present

## 2021-04-11 NOTE — Telephone Encounter (Signed)
Returned call to patient. No answer. Left message on VM requesting return call.  °

## 2021-04-11 NOTE — Telephone Encounter (Signed)
Requesting to speak with a nurse about meds. Please call pt back.  

## 2021-04-11 NOTE — Telephone Encounter (Signed)
Patient called, states he was tested for STIs at the health department and that his rectal swab came back positive for gonorrhea and chlamydia. Advised him to contact the HD to set up treatment. He is requesting that treatment be done at our office.   RN called [facility], left voicemail requesting call back in order to request test results.   [facility]: [phone] ext. [phone]  Sandie Ano, RN

## 2021-04-11 NOTE — Telephone Encounter (Signed)
Thank you. I agree 

## 2021-04-11 NOTE — Telephone Encounter (Signed)
Patient returned call. States he needs refill on biktarvy. Explained this is being managed by [facility].  Also, states he was told by [facility] that he tested positive for gonorrhea and chlamydia. Advised him to contact [facility] for treatment. He will call them now.

## 2021-04-12 NOTE — Telephone Encounter (Signed)
Left second voicemail requesting call back at [facility].   Sandie Ano, RN

## 2021-04-13 NOTE — Telephone Encounter (Signed)
I spoke with Donnelly Angelica with [facility] regarding  to patient's STI testing. Per [facility] patient was tested at [facility] on 04/06/21 and will need treatment. Donnelly Angelica will be reaching out to the scheduler with the health department to get the patient scheduled to come there for treatment. The scheduler should be reaching out to the patient tomorrow.  I attempted to contact the patient to follow up with him and patient did not answer. No secured voicemail and no message was left. I will try to reach our to patient tomorrow.  Taygen Acklin T Pricilla Loveless

## 2021-04-13 NOTE — Telephone Encounter (Signed)
Left voicemail for patient requesting call back as we have not heard back from the health department. Patient should either be treated there, or call to request that his results be faxed to [place].   Sandie Ano, RN

## 2021-04-14 NOTE — Telephone Encounter (Signed)
Spoke with the patient and advised him to be look out for a phone call from the health department regarding getting him scheduled for STI treatment.  Mazzie Brodrick T Pricilla Loveless

## 2021-04-21 ENCOUNTER — Ambulatory Visit (INDEPENDENT_AMBULATORY_CARE_PROVIDER_SITE_OTHER): Payer: Medicare (Managed Care)

## 2021-04-21 ENCOUNTER — Other Ambulatory Visit: Payer: Self-pay

## 2021-04-21 DIAGNOSIS — Z23 Encounter for immunization: Secondary | ICD-10-CM

## 2021-04-21 NOTE — Progress Notes (Signed)
    [facility]  Name:  Jamontae Thwaites    MRN: [id number] DOB: Jul 14, 1981   04/21/2021  Mr. Suddreth was observed post JYNNEOS immunization for 15 minutes without incident. He was provided with Vaccine Information Sheet and instruction to access the V-Safe system.   Mr. Stoutenburg was instructed to call 911 with any severe reactions post vaccine: Difficulty breathing  Swelling of face and throat  A fast heartbeat  A bad rash all over body  Dizziness and weakness

## 2021-04-24 ENCOUNTER — Ambulatory Visit: Payer: Medicare (Managed Care) | Admitting: Behavioral Health

## 2021-05-01 ENCOUNTER — Ambulatory Visit: Payer: Medicare (Managed Care) | Admitting: Behavioral Health

## 2021-05-07 ENCOUNTER — Other Ambulatory Visit: Payer: Self-pay | Admitting: Student

## 2021-05-07 DIAGNOSIS — G540 Brachial plexus disorders: Secondary | ICD-10-CM

## 2021-05-22 ENCOUNTER — Telehealth: Payer: Self-pay | Admitting: Behavioral Health

## 2021-05-22 ENCOUNTER — Ambulatory Visit: Payer: Medicare (Managed Care) | Admitting: Behavioral Health

## 2021-05-22 NOTE — Telephone Encounter (Signed)
Contacted Pt for today's IBH Telehealth session & lft msg for Pt to call [facility] & r/s @ his convenience.   Dr. Monna Fam

## 2021-05-25 ENCOUNTER — Other Ambulatory Visit: Payer: Self-pay

## 2021-05-25 ENCOUNTER — Ambulatory Visit (INDEPENDENT_AMBULATORY_CARE_PROVIDER_SITE_OTHER): Payer: Medicare (Managed Care)

## 2021-05-25 DIAGNOSIS — Z23 Encounter for immunization: Secondary | ICD-10-CM | POA: Diagnosis not present

## 2021-05-25 NOTE — Progress Notes (Signed)
    [facility]  Name:  Ekam Besson    MRN: [id number] DOB: Oct 04, 1980   05/25/2021  Mr. Dase was provided with Vaccine Information Sheet and instruction to access the V-Safe system.   Mr. Evett was instructed to call 911 with any severe reactions post vaccine: Difficulty breathing  Swelling of face and throat  A fast heartbeat  A bad rash all over body  Dizziness and weakness    Sandie Ano, RN

## 2021-06-02 ENCOUNTER — Ambulatory Visit: Payer: Medicare (Managed Care)

## 2021-06-06 DIAGNOSIS — F3132 Bipolar disorder, current episode depressed, moderate: Secondary | ICD-10-CM | POA: Diagnosis not present

## 2021-06-06 DIAGNOSIS — F1120 Opioid dependence, uncomplicated: Secondary | ICD-10-CM | POA: Diagnosis not present

## 2021-06-06 DIAGNOSIS — F1510 Other stimulant abuse, uncomplicated: Secondary | ICD-10-CM | POA: Diagnosis not present

## 2021-06-13 ENCOUNTER — Other Ambulatory Visit: Payer: Self-pay

## 2021-06-13 DIAGNOSIS — G540 Brachial plexus disorders: Secondary | ICD-10-CM

## 2021-06-14 MED ORDER — GABAPENTIN 400 MG PO CAPS: 400.0000 mg | ORAL_CAPSULE | Freq: Three times a day (TID) | ORAL | 2 refills | Status: DC

## 2021-06-22 ENCOUNTER — Encounter: Payer: Self-pay | Admitting: Student

## 2021-06-22 ENCOUNTER — Encounter: Payer: Self-pay | Admitting: *Deleted

## 2021-06-22 NOTE — Progress Notes (Signed)
Things That May Be Affecting Your Health:  Alcohol  Hearing loss  Pain    Depression  Home Safety X Sexual Health   Diabetes  Lack of physical activity  Stress   Difficulty with daily activities  Loneliness  Tiredness  X Drug use  Medicines  Tobacco use   Falls  Motor Vehicle Safety  Weight   Food choices  Oral Health  Other    YOUR PERSONALIZED HEALTH PLAN : 1. Schedule your next subsequent Medicare Wellness visit in one year 2. Attend all of your regular appointments to address your medical issues 3. Complete the preventative screenings and services   Annual Wellness Visit   Medicare Covered Preventative Screenings and Services  Services & Screenings Men and Women Who How Often Need? Date of Last Service Action  Abdominal Aortic Aneurysm Adults with AAA risk factors Once      Alcohol Misuse and Counseling All Adults Screening once a year if no alcohol misuse. Counseling up to 4 face to face sessions.     Bone Density Measurement  Adults at risk for osteoporosis Once every 2 yrs      Lipid Panel Z13.6 All adults without CV disease Once every 5 yrs       Colorectal Cancer  Stool sample or Colonoscopy All adults 50 and older  Once every year Every 10 years        Depression All Adults Once a year  Today   Diabetes Screening Blood glucose, post glucose load, or GTT Z13.1 All adults at risk Pre-diabetics Once per year Twice per year      Diabetes  Self-Management Training All adults Diabetics 10 hrs first year; 2 hours subsequent years. Requires Copay     Glaucoma Diabetics Family history of glaucoma African Americans 50 yrs + Hispanic Americans 65 yrs + Annually - requires coppay      Hepatitis C Z72.89 or F19.20 High Risk for HCV Born between 1945 and 1965 Annually Once      HIV Z11.4 All adults based on risk Annually btw ages 48 & 76 regardless of risk Annually > 65 yrs if at increased risk X     Lung Cancer Screening Asymptomatic adults aged 40-77 with 30  pack yr history and current smoker OR quit within the last 15 yrs Annually Must have counseling and shared decision making documentation before first screen      Medical Nutrition Therapy Adults with  Diabetes Renal disease Kidney transplant within past 3 yrs 3 hours first year; 2 hours subsequent years     Obesity and Counseling All adults Screening once a year Counseling if BMI 30 or higher  Today   Tobacco Use Counseling Adults who use tobacco  Up to 8 visits in one year     Vaccines Z23 Hepatitis B Influenza  Pneumonia  Adults  Once Once every flu season Two different vaccines separated by one year X    Next Annual Wellness Visit People with Medicare Every year  Today     Services & Screenings Women Who How Often Need  Date of Last Service Action  Mammogram  Z12.31 Women over 40 One baseline ages 62-39. Annually ager 40 yrs+      Pap tests All women Annually if high risk. Every 2 yrs for normal risk women      Screening for cervical cancer with  Pap (Z01.419 nl or Z01.411abnl) & HPV Z11.51 Women aged 44 to 36 Once every 5 yrs  Screening pelvic and breast exams All women Annually if high risk. Every 2 yrs for normal risk women     Sexually Transmitted Diseases Chlamydia Gonorrhea Syphilis All at risk adults Annually for non pregnant females at increased risk         Services & Screenings Men Who How Ofter Need  Date of Last Service Action  Prostate Cancer - DRE & PSA Men over 50 Annually.  DRE might require a copay.        Sexually Transmitted Diseases Syphilis All at risk adults Annually for men at increased risk X     Health Maintenance List Health Maintenance  Topic Date Due   Pneumococcal Vaccine 61-3 Years old (1 - PCV) Never done   INFLUENZA VACCINE  03/13/2021   COVID-19 Vaccine (3 - Booster for Janssen series) 04/03/2021   TETANUS/TDAP  10/18/2030   Hepatitis C Screening  Completed   HIV Screening  Completed   HPV VACCINES  Aged Out

## 2021-06-22 NOTE — Progress Notes (Unsigned)

## 2021-06-28 ENCOUNTER — Ambulatory Visit: Payer: Medicare (Managed Care)

## 2021-07-10 ENCOUNTER — Ambulatory Visit: Payer: Medicare (Managed Care)

## 2021-07-20 ENCOUNTER — Other Ambulatory Visit: Payer: Self-pay

## 2021-07-20 ENCOUNTER — Encounter: Payer: Self-pay | Admitting: Internal Medicine

## 2021-07-20 ENCOUNTER — Ambulatory Visit (INDEPENDENT_AMBULATORY_CARE_PROVIDER_SITE_OTHER): Payer: Medicare (Managed Care) | Admitting: Internal Medicine

## 2021-07-20 VITALS — BP 121/73 | HR 101 | Temp 98.1°F | Ht 63.0 in | Wt 176.1 lb

## 2021-07-20 DIAGNOSIS — G540 Brachial plexus disorders: Secondary | ICD-10-CM

## 2021-07-20 DIAGNOSIS — Z23 Encounter for immunization: Secondary | ICD-10-CM

## 2021-07-20 DIAGNOSIS — Z0000 Encounter for general adult medical examination without abnormal findings: Secondary | ICD-10-CM

## 2021-07-20 MED ORDER — GABAPENTIN 400 MG PO CAPS: 400.0000 mg | ORAL_CAPSULE | Freq: Three times a day (TID) | ORAL | 2 refills | Status: DC

## 2021-07-20 MED ORDER — DULOXETINE HCL 30 MG PO CPEP
ORAL_CAPSULE | ORAL | 2 refills | Status: DC
Start: 2021-07-20 — End: 2022-01-17

## 2021-07-20 NOTE — Patient Instructions (Signed)
Thank you, Mr.Reshad Sheckler for allowing [place] to provide your care today. Today we discussed TOS, anxiety vaccines.    Labs/Tests Ordered: Lab Orders  No laboratory test(s) ordered today     Referrals Ordered:  Referral Orders  No referral(s) requested today     Medication Changes:  Medications Discontinued During This Encounter  Medication Reason   DULoxetine (CYMBALTA) 30 MG capsule Reorder   gabapentin (NEURONTIN) 400 MG capsule Reorder     Meds ordered this encounter  Medications   gabapentin (NEURONTIN) 400 MG capsule    Sig: Take 1 capsule (400 mg total) by mouth 3 (three) times daily.    Dispense:  90 capsule    Refill:  2   DULoxetine (CYMBALTA) 30 MG capsule    Sig: TAKE 3 CAPSULES(90 MG) BY MOUTH DAILY    Dispense:  90 capsule    Refill:  2     Health Maintenance Screening: There are no preventive care reminders to display for this patient.   Instructions:   Follow up: 1 year   Remember: If you have any questions or concerns, call our clinic at [phone] or after hours call [phone] and ask for the internal medicine resident on call.  Dellia Cloud, D.O. [facility]

## 2021-07-21 ENCOUNTER — Encounter: Payer: Self-pay | Admitting: Internal Medicine

## 2021-07-21 NOTE — Assessment & Plan Note (Signed)
Gave flu vaccine

## 2021-07-21 NOTE — Assessment & Plan Note (Addendum)
Refilled gabapentin and Cymbalta. States that he is tolerating these medications well without side effects.

## 2021-07-21 NOTE — Progress Notes (Signed)
Subjective:  CC: TOS  HPI:  [name] is a 40 y.o. male with a past medical history stated below and presents today for TOS. Please see problem based assessment and plan for additional details.  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 MRI brain and cervical spine showed bilateral neural foraminal cysts C4-C5, C5-C6, C6-C7, brain within normal limits.  - continue cymbalta, flexeril, neurontin - f/u with Dr. Debbra Riding [facility] neurology 03/28/19   Depression    History of syphilis 12/30/2017   RPR 1:1 12/25/17 s/p adequate treatment   HIV (human immunodeficiency virus infection) (HCC)    TOS (thoracic outlet syndrome)     Current Outpatient Medications on File Prior to Visit  Medication Sig Dispense Refill   acetaminophen (TYLENOL) 500 MG tablet Take 1 tablet (500 mg total) by mouth every 6 (six) hours as needed. 30 tablet 0   bictegravir-emtricitabine-tenofovir AF (BIKTARVY) 50-200-25 MG TABS tablet Take 1 tablet by mouth daily. 30 tablet 5   hydrOXYzine (ATARAX/VISTARIL) 25 MG tablet Take 1 tablet (25 mg total) by mouth 3 (three) times daily as needed. 30 tablet 0   triamcinolone ointment (KENALOG) 0.5 % Apply 1 application topically 2 (two) times daily. (Patient not taking: Reported on 02/06/2021) 30 g 0   No current facility-administered medications on file prior to visit.    Family History  Problem Relation Age of Onset   COPD Mother     Social History   Socioeconomic History   Marital status: Single    Spouse name: Not on file   Number of children: Not on file   Years of education: Not on file   Highest education level: Not on file  Occupational History   Not on file  Tobacco Use   Smoking status: Never   Smokeless tobacco: Never  Vaping Use   Vaping Use: Never used  Substance and Sexual Activity   Alcohol use: Not Currently   Drug use: Yes    Types: IV,  Methamphetamines    Comment: last used last nght   Sexual activity: Yes    Partners: Male    Comment: declined condoms; 1/21  Other Topics Concern   Not on file  Social History Narrative   Not on file   Social Determinants of Health   Financial Resource Strain: Not on file  Food Insecurity: Not on file  Transportation Needs: Not on file  Physical Activity: Not on file  Stress: Not on file  Social Connections: Not on file  Intimate Partner Violence: Not on file    Review of Systems: ROS negative except for what is noted on the assessment and plan.  Objective:   Vitals:   07/20/21 1453  BP: 121/73  Pulse: (!) 101  Temp: 98.1 F (36.7 C)  TempSrc: Oral  SpO2: 98%  Weight: 176 lb 1.6 oz (79.9 kg)  Height: 5\' 3"  (1.6 m)    Physical Exam: Gen: A&O x3 and in no apparent distress, well appearing and nourished. Neck: no masses or nodules, AROM intact. CV: RRR, no murmurs, S1/S2 presents  Resp: Clear to ascultation bilaterally  Abd: BS (+) x4, soft, non-tender abdomen, without hepatosplenomegaly or masses MSK: Grossly normal AROM and strength x4 extremities. Skin: good skin turgor, no rashes, unusual bruising, or prominent lesions.  Neuro: No focal deficits, grossly normal sensation and coordination.  Psych: Oriented x3 and responding appropriately. Intact memory, normal  mood, judgement, affect, and insight.    Assessment & Plan:  See Encounters Tab for problem based charting.  Patient discussed with Dr. Jeral Pinch, D.O. [facility]  PGY-3 Pager: [phone]  Phone: [phone] Date 07/21/2021  Time 5:08 PM

## 2021-07-31 NOTE — Progress Notes (Signed)
Internal Medicine Clinic Attending  Case discussed with Dr. Coe  At the time of the visit.  We reviewed the resident's history and exam and pertinent patient test results.  I agree with the assessment, diagnosis, and plan of care documented in the resident's note.  

## 2021-08-01 DIAGNOSIS — F3132 Bipolar disorder, current episode depressed, moderate: Secondary | ICD-10-CM | POA: Diagnosis not present

## 2021-08-01 DIAGNOSIS — F1510 Other stimulant abuse, uncomplicated: Secondary | ICD-10-CM | POA: Diagnosis not present

## 2021-08-01 DIAGNOSIS — F1120 Opioid dependence, uncomplicated: Secondary | ICD-10-CM | POA: Diagnosis not present

## 2021-08-02 ENCOUNTER — Other Ambulatory Visit: Payer: Self-pay

## 2021-08-02 ENCOUNTER — Encounter: Payer: Self-pay | Admitting: Infectious Diseases

## 2021-08-02 ENCOUNTER — Ambulatory Visit (INDEPENDENT_AMBULATORY_CARE_PROVIDER_SITE_OTHER): Payer: Medicare (Managed Care)

## 2021-08-02 ENCOUNTER — Ambulatory Visit: Payer: Medicare (Managed Care) | Admitting: Infectious Diseases

## 2021-08-02 ENCOUNTER — Other Ambulatory Visit (HOSPITAL_COMMUNITY)
Admission: RE | Admit: 2021-08-02 | Discharge: 2021-08-02 | Disposition: A | Discharge status: Home / Self Care | Payer: Medicare (Managed Care) | Source: Ambulatory Visit | Attending: Infectious Diseases | Admitting: Infectious Diseases

## 2021-08-02 ENCOUNTER — Ambulatory Visit (INDEPENDENT_AMBULATORY_CARE_PROVIDER_SITE_OTHER): Payer: Medicare (Managed Care) | Admitting: Infectious Diseases

## 2021-08-02 VITALS — BP 127/84 | HR 118 | Temp 98.4°F | Wt 174.0 lb

## 2021-08-02 DIAGNOSIS — Z23 Encounter for immunization: Secondary | ICD-10-CM

## 2021-08-02 DIAGNOSIS — Z113 Encounter for screening for infections with a predominantly sexual mode of transmission: Secondary | ICD-10-CM

## 2021-08-02 DIAGNOSIS — F1990 Other psychoactive substance use, unspecified, uncomplicated: Secondary | ICD-10-CM | POA: Diagnosis not present

## 2021-08-02 DIAGNOSIS — B20 Human immunodeficiency virus [HIV] disease: Secondary | ICD-10-CM | POA: Diagnosis not present

## 2021-08-02 MED ORDER — BIKTARVY 50-200-25 MG PO TABS
1.0000 | ORAL_TABLET | Freq: Every day | ORAL | 5 refills | Status: DC
Start: 2021-08-02 — End: 2022-02-07

## 2021-08-02 NOTE — Progress Notes (Signed)
Name: Kenneth Hunt  DOB: 03/27/1981 MRN: [id number] PCP: Sanjuan Dame, MD     Brief Narrative:  Kenneth Hunt is a 40 y.o. male with well controlled HIV. Dx   Transferred care from [place] in [place]  [id number])  VL < 20 recently with CD4 ~340 on Biktarvy  HIV Risk: MSM History of OIs: none known  Intake Labs 07/23/2019: Hep B sAg (-), sAb (-), cAb (-); Hep A (immune), Hep C (-) Quantiferon (-) HLA B*5701 (-) G6PD: () Toxo IgG: (-)   Previous Regimens: Biktarvy   Genotypes: None on record   Subjective:   Chief Complaint  Patient presents with   Follow-up    B20      HPI: Kenneth Hunt is here for 52mfolow up. He has been doing well on Biktarvy and no missed doses of medications. No concern over side effects. Has insurance access with [employer].   Asking about Shingrix vaccine - has had a rash in the past that has come up a few times on his back, unilateral, always same spot. It was quite itchy. May have been triggered by meth injection but it is always in same spot and same quality. He is very interested in getting shingrix if available for him.   Also would like to get COVID bialent booster today. Last dose 424mgo.   Working with [employer] strict with drug use. Had a positive UDS recently.  Injecting meth once a month- not having trouble with cravings but uses more as a "treat" for himself.   Would like test of cure for chlamydia we treated before.    Review of Systems  Constitutional:  Negative for appetite change, chills, fatigue, fever and unexpected weight change.  Eyes:  Negative for visual disturbance.  Respiratory:  Negative for cough and shortness of breath.   Cardiovascular:  Negative for chest pain and leg swelling.  Gastrointestinal:  Negative for abdominal pain, diarrhea and nausea.  Genitourinary:  Negative for dysuria, genital sores and penile discharge.  Musculoskeletal:  Negative for joint swelling.   Skin:  Negative for color change and rash.  Neurological:  Negative for dizziness and headaches.  Hematological:  Negative for adenopathy.  Psychiatric/Behavioral:  Negative for sleep disturbance. The patient is not nervous/anxious.     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 and cervical spine showed bilateral neural foraminal cysts C4-C5, C5-C6, C6-C7, brain within normal limits.  - continue cymbalta, flexeril, neurontin - [facility]   Depression    History of syphilis 12/30/2017   RPR 1:1 12/25/17 s/p adequate treatment   [place] virus infection) [place]   TOS (thoracic outlet syndrome)     Outpatient Medications Prior to Visit  Medication Sig Dispense Refill   DULoxetine (CYMBALTA) 30 MG capsule TAKE 3 CAPSULES(90 MG) BY MOUTH DAILY 90 capsule 2   gabapentin (NEURONTIN) 400 MG capsule Take 1 capsule (400 mg total) by mouth 3 (three) times daily. 90 capsule 2   bictegravir-emtricitabine-tenofovir AF (BIKTARVY) 50-200-25 MG TABS tablet Take 1 tablet by mouth daily. 30 tablet 5   acetaminophen (TYLENOL) 500 MG tablet Take 1 tablet (500 mg total) by mouth every 6 (six) hours as needed. (Patient not taking: Reported on 08/02/2021) 30 tablet 0   hydrOXYzine (ATARAX/VISTARIL) 25 MG tablet Take 1 tablet (25 mg total) by mouth 3 (three) times  daily as needed. (Patient not taking: Reported on 08/02/2021) 30 tablet 0   triamcinolone ointment (KENALOG) 0.5 % Apply 1 application topically 2 (two) times daily. (Patient not taking: Reported on 02/06/2021) 30 g 0   No facility-administered medications prior to visit.     Allergies  Allergen Reactions   Haloperidol Other (See Comments)    Tardive dyskinesia    Social History   Tobacco Use   Smoking status: Never   Smokeless tobacco: Never  Vaping Use   Vaping Use: Never used   Substance Use Topics   Alcohol use: Not Currently   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, Methamphetamines    Comment: last used last nght    Social History   Substance and Sexual Activity  Sexual Activity Yes   Partners: Male   Comment: declined condoms     Objective:   Vitals:   08/02/21 1454  BP: 127/84  Pulse: (!) 118  Temp: 98.4 F (36.9 C)  TempSrc: Oral  Weight: 174 lb (78.9 kg)   Body mass index is 30.82 kg/m.  Physical Exam Vitals reviewed.  Constitutional:      Appearance: He is well-developed.     Comments: Seated comfortably in chair during visit.   HENT:     Mouth/Throat:     Dentition: Normal dentition. No dental abscesses.  Cardiovascular:     Rate and Rhythm: Normal rate and regular rhythm.     Heart sounds: Normal heart sounds.  Pulmonary:     Effort: Pulmonary effort is normal.     Breath sounds: Normal breath sounds.  Abdominal:     General: There is no distension.     Palpations: Abdomen is soft.     Tenderness: There is no abdominal tenderness.  Lymphadenopathy:     Cervical: No cervical adenopathy.  Skin:    General: Skin is warm and dry.     Findings: No rash.  Neurological:     Mental Status: He is alert and oriented to person, place, and time.  Psychiatric:        Judgment: Judgment normal.     Comments: In good spirits today and engaged in care discussion.     Lab Results Lab Results  Component Value Date   WBC 14.6 (H) 11/12/2020   HGB 14.5 11/12/2020   HCT 43.3 11/12/2020   MCV 93.5 11/12/2020   PLT 240 11/12/2020    Lab Results  Component Value Date   CREATININE 1.07 11/12/2020   BUN 16 11/12/2020   NA 139 11/12/2020   K 5.4 (H) 11/12/2020   CL 107 11/12/2020   CO2 23 11/12/2020    Lab Results  Component Value Date   ALT 32 11/12/2020   AST 32 11/12/2020   ALKPHOS 67 11/12/2020   BILITOT 0.9 11/12/2020    Lab Results  Component Value Date   CHOL 155 07/23/2019   HDL 55 07/23/2019   LDLCALC 85  07/23/2019   TRIG 60 07/23/2019   CHOLHDL 2.8 07/23/2019   HIV 1 RNA Quant (Copies/mL)  Date Value  02/06/2021 <20 (H)  06/27/2020 <20 (H)  04/06/2020 <20 (H)   CD4 T Cell Abs (/uL)  Date Value  02/06/2021 595  06/27/2020 859  04/06/2020 689     Assessment & Plan:   Problem List Items Addressed This Visit       High   Human immunodeficiency virus (HIV) disease ([place]) (Chronic)    Doing well on Biktarvy once daily  with excellent viral suppression and healthy CD4 recovery. Update pertinent labs today for monitoring.  COVID Bivalent booster today Flu previously received.  I think Shingrix is a good idea for him given suspicious sounding previous rash and immunocompromising condition. I will ask him to cal his insurance to see if it is covered - can get at local pharmacy.   Return in about 6 months (around 01/31/2022).       Relevant Medications   bictegravir-emtricitabine-tenofovir AF (BIKTARVY) 50-200-25 MG TABS tablet   Other Relevant Orders   HIV 1 RNA quant-no reflex-bld   T-helper cells (CD4) count     Medium    Injection of illicit drug within last 12 months    Hep C antibody for high risk screening today.       Relevant Orders   Hepatitis C Antibody   Other Visit Diagnoses     Routine screening for STI (sexually transmitted infection)    -  Primary   Relevant Orders   Urine cytology ancillary only   Cytology (oral, anal, urethral) ancillary only      Janene Madeira, MSN, NP-[facility] [place] [facility] [place] Pager: [phone] Office: [phone]  08/02/21  3:36 PM

## 2021-08-02 NOTE — Assessment & Plan Note (Signed)
Doing well on Biktarvy once daily with excellent viral suppression and healthy CD4 recovery. Update pertinent labs today for monitoring.  COVID Bivalent booster today Flu previously received.  I think Shingrix is a good idea for him given suspicious sounding previous rash and immunocompromising condition. I will ask him to cal his insurance to see if it is covered - can get at local pharmacy.   Return in about 6 months (around 01/31/2022).

## 2021-08-02 NOTE — Assessment & Plan Note (Signed)
Hep C antibody for high risk screening today.

## 2021-08-02 NOTE — Progress Notes (Signed)
° °  Covid-19 Vaccination Clinic  Name:  Kenneth Hunt    MRN: [id number] DOB: 1980/10/15  08/02/2021  [name] was observed post Covid-19 immunization for 15 minutes without incident. He was provided with Vaccine Information Sheet and instruction to access the V-Safe system.   [name] was instructed to call 911 with any severe reactions post vaccine: Difficulty breathing  Swelling of face and throat  A fast heartbeat  A bad rash all over body  Dizziness and weakness   Immunizations Administered     Name Date Dose VIS Date Route   Pfizer Covid-19 Vaccine Bivalent Booster 08/02/2021  4:18 PM 0.3 mL 04/12/2021 Intramuscular   Manufacturer: [employer], [employer]   Lot: [id number]   NDC: [address]-6      Harriet Pho

## 2021-08-02 NOTE — Assessment & Plan Note (Signed)
>>  ASSESSMENT AND PLAN FOR INJECTION OF ILLICIT DRUG WITHIN LAST 12 MONTHS WRITTEN ON 08/02/2021  3:36 PM BY DIXON, STEPHANIE N, NP  Hep C antibody for high risk screening today.

## 2021-08-02 NOTE — Patient Instructions (Addendum)
Please stop by the lab on your way   Call your insurance to see if you can get the Shingrix vaccine (2 shots 8 weeks apart). Tell them that you have had shingles before (it sounds like from your description of the rash you had it could have very well been) and that you have a immunocompromising condition.  You can get this vaccine at local pharmacy.   Continue your biktarvy everyday. Refills have been sent in - Will let you know your results over mychart portal.   Please schedule a return visit in 26m. We can do labs same day.

## 2021-08-03 LAB — URINE CYTOLOGY ANCILLARY ONLY
Chlamydia: NEGATIVE
Comment: NEGATIVE
Comment: NORMAL
Neisseria Gonorrhea: NEGATIVE

## 2021-08-03 LAB — CYTOLOGY, (ORAL, ANAL, URETHRAL) ANCILLARY ONLY
Chlamydia: NEGATIVE
Comment: NEGATIVE
Comment: NORMAL
Neisseria Gonorrhea: NEGATIVE

## 2021-08-04 LAB — T-HELPER CELLS (CD4) COUNT (NOT AT ARMC)
CD4 % Helper T Cell: 27 % — ABNORMAL LOW (ref 33–65)
CD4 T Cell Abs: 989 /uL (ref 400–1790)

## 2021-08-05 LAB — HEPATITIS C ANTIBODY
Hepatitis C Ab: NONREACTIVE
SIGNAL TO CUT-OFF: 0.05 (ref <1.00)

## 2021-08-05 LAB — HIV-1 RNA QUANT-NO REFLEX-BLD
HIV 1 RNA Quant: <20 Copies/mL — ABNORMAL HIGH
HIV-1 RNA Quant, Log: <1.3 Log cps/mL — ABNORMAL HIGH

## 2021-08-09 ENCOUNTER — Institutional Professional Consult (permissible substitution): Payer: Medicare (Managed Care) | Admitting: Behavioral Health

## 2021-08-22 ENCOUNTER — Telehealth: Payer: Self-pay

## 2021-08-22 NOTE — Telephone Encounter (Signed)
Patient called wanting to go over recent lab results. Relayed that viral load was undetectable, CD4 count was 989, he was negative for gonorrhea, chlamydia, and hepatitis C. Patient verbalized understanding and has no further questions.   Sandie Ano, RN

## 2021-08-29 ENCOUNTER — Ambulatory Visit: Payer: Medicare (Managed Care) | Admitting: Behavioral Health

## 2021-08-29 DIAGNOSIS — F419 Anxiety disorder, unspecified: Secondary | ICD-10-CM

## 2021-08-29 DIAGNOSIS — F331 Major depressive disorder, recurrent, moderate: Secondary | ICD-10-CM

## 2021-08-29 NOTE — BH Specialist Note (Signed)
Integrated Behavioral Health via Telemedicine Visit  08/29/2021 Kenneth Hunt [id number]  Number of Integrated Behavioral Health visits: 8 Session Start time: 1:30pm  Session End time: 2:00pm Total time: 30  Referring Provider: Dr. Marijo Conception, MD Patient/Family location: Pt is @ [facility] in private [facility] Provider location: [facility] Office All persons participating in visit: Pt & Clinician Types of Service: Individual psychotherapy  I connected with Kenneth Hunt and/or Kenneth Hunt's  self  via  Telephone or Video Enabled Telemedicine Application  (Video is Caregility application) and verified that I am speaking with the correct person using two identifiers. Discussed confidentiality:  8th visit  I discussed the limitations of telemedicine and the availability of in person appointments.  Discussed there is a possibility of technology failure and discussed alternative modes of communication if that failure occurs.  I discussed that engaging in this telemedicine visit, they consent to the provision of behavioral healthcare and the services will be billed under their insurance.  Patient and/or legal guardian expressed understanding and consented to Telemedicine visit:  8th visit  Presenting Concerns: Patient and/or family reports the following symptoms/concerns: dec in anx/dep due to progress w/his Duration of problem: months; Severity of problem: moderate; Pt has recently exp'd a setback in his recovery. He had a sudden urge to use & called up a familiar dealer. He knows he will eventually block this contacts number.  More immediately, Pt & Clinician identified he may want to recognize what is happening prior to "the sudden urge" so he can deal w/the craving when he is less vulnerable. Pt agreed.   Patient and/or Family's Strengths/Protective Factors: Social and Emotional competence, Concrete supports in place (healthy food, safe environments, etc.), Sense of purpose,  and Physical Health (exercise, healthy diet, medication compliance, etc.)  Goals Addressed: Patient will:  Reduce symptoms of: anxiety and depression   Increase knowledge and/or ability of: coping skills, healthy habits, and stress reduction   Demonstrate ability to: Increase healthy adjustment to current life circumstances and Increase motivation to adhere to plan of care  Progress towards Goals: Ongoing  Interventions: Interventions utilized:  Solution-Focused Strategies, Behavioral Activation, and Supportive Counseling Standardized Assessments completed:  screeners prn  Patient and/or Family Response: Pt receptive to call today after a few moments. Pt requests future call.  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 He is trying to "stretch myself" outside his comfort zone. Pt acknowledged he has leadership skills, but is tentative to label them as such.  Pt is compliant w/his medications & Psychiatry visits. Pt has also created a POA w/[facility] to monitor his use w/routine UA testing. Pt is also seeking a Civil engineer, contracting in the community.   Patient may benefit from cont'd ck-in calls to monitor his progress, dvlp ideas, & address his Recovery needs.  Plan: Follow up with behavioral health clinician on : 2-3 wks for 30 min telehealth visit Behavioral recommendations: Write a gratitude list in your Journal to assist managing your Sx of dep Referral(s): Integrated [employer] (In Clinic)  I discussed the assessment and treatment plan with the patient and/or parent/guardian. They were provided an opportunity to ask questions and all were answered. They agreed with the plan and demonstrated an understanding of the instructions.   They were advised to call back or seek an in-person evaluation if the symptoms worsen or if the condition  fails  to improve as anticipated.  Deneise Lever, LMFT

## 2021-09-12 DIAGNOSIS — F1520 Other stimulant dependence, uncomplicated: Secondary | ICD-10-CM | POA: Diagnosis not present

## 2021-09-12 DIAGNOSIS — F319 Bipolar disorder, unspecified: Secondary | ICD-10-CM | POA: Diagnosis not present

## 2021-09-12 DIAGNOSIS — F32A Depression, unspecified: Secondary | ICD-10-CM | POA: Diagnosis not present

## 2021-09-20 ENCOUNTER — Ambulatory Visit: Payer: Medicare (Managed Care) | Admitting: Behavioral Health

## 2021-09-20 ENCOUNTER — Telehealth: Payer: Self-pay | Admitting: Behavioral Health

## 2021-09-20 NOTE — Telephone Encounter (Signed)
Lft msgs for Pt to r/s 30 min ck-in appt w/[facility] @ his convenience.  Dr. Monna Fam

## 2021-10-12 DIAGNOSIS — F3132 Bipolar disorder, current episode depressed, moderate: Secondary | ICD-10-CM | POA: Diagnosis not present

## 2021-10-12 DIAGNOSIS — F1510 Other stimulant abuse, uncomplicated: Secondary | ICD-10-CM | POA: Diagnosis not present
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
## 2021-12-07 DIAGNOSIS — F1510 Other stimulant abuse, uncomplicated: Secondary | ICD-10-CM | POA: Diagnosis not present

## 2021-12-07 DIAGNOSIS — F3132 Bipolar disorder, current episode depressed, moderate: Secondary | ICD-10-CM | POA: Diagnosis not present

## 2021-12-22 ENCOUNTER — Telehealth: Payer: Self-pay

## 2021-12-22 ENCOUNTER — Ambulatory Visit (INDEPENDENT_AMBULATORY_CARE_PROVIDER_SITE_OTHER): Payer: Medicare (Managed Care) | Admitting: Internal Medicine

## 2021-12-22 DIAGNOSIS — Z0000 Encounter for general adult medical examination without abnormal findings: Secondary | ICD-10-CM

## 2021-12-22 NOTE — Progress Notes (Deleted)
Subjective:   Kenneth Hunt is a 41 y.o. male who presents for an Initial Medicare Annual Wellness Visit. I connected with  Glover Haberl on 12/22/21 by a audio enabled telemedicine application and verified that I am speaking with the correct person using two identifiers.  Patient Location: Home  Provider Location: Office/Clinic  I discussed the limitations of evaluation and management by telemedicine. The patient expressed understanding and agreed to proceed.  Review of Systems    Defer to PCP       Objective:    There were no vitals filed for this visit. There is no height or weight on file to calculate BMI.     07/20/2021    2:43 PM 01/12/2021    3:21 PM 11/13/2020    3:51 AM 11/12/2020    6:59 PM 10/25/2020    3:47 PM 10/17/2020   10:04 AM 12/21/2019    6:32 PM  Advanced Directives  Does Patient Have a Medical Advance Directive? No No No No No No No  Would patient like information on creating a medical advance directive? No - Patient declined No - Patient declined No - Patient declined No - Patient declined Yes (MAU/Ambulatory/Procedural Areas - Information given) No - Patient declined No - Patient declined    Current Medications (verified) Outpatient Encounter Medications as of 12/22/2021  Medication Sig   bictegravir-emtricitabine-tenofovir AF (BIKTARVY) 50-200-25 MG TABS tablet Take 1 tablet by mouth daily.   DULoxetine (CYMBALTA) 30 MG capsule TAKE 3 CAPSULES(90 MG) BY MOUTH DAILY   gabapentin (NEURONTIN) 400 MG capsule Take 1 capsule (400 mg total) by mouth 3 (three) times daily.   No facility-administered encounter medications on file as of 12/22/2021.    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 MRI brain and cervical spine showed bilateral neural foraminal cysts C4-C5, C5-C6, C6-C7, brain within normal limits.  - continue  cymbalta, flexeril, neurontin - f/u with Dr. Debbra Riding [facility] neurology 03/28/19   Depression    History of syphilis 12/30/2017   RPR 1:1 12/25/17 s/p adequate treatment   HIV (human immunodeficiency virus infection) (HCC)    TOS (thoracic outlet syndrome)    Past Surgical History:  Procedure Laterality Date   tos surgery     Family History  Problem Relation Age of Onset   COPD Mother    Social History   Socioeconomic History   Marital status: Single    Spouse name: Not on file   Number of children: Not on file   Years of education: Not on file   Highest education level: Not on file  Occupational History   Not on file  Tobacco Use   Smoking status: Never   Smokeless tobacco: Never  Vaping Use   Vaping Use: Never used  Substance and Sexual Activity   Alcohol use: Not Currently   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, Methamphetamines    Comment: last used last nght   Sexual activity: Yes    Partners: Male    Comment: declined condoms  Other Topics Concern   Not on file  Social History Narrative   Not on file   Social Determinants of Health   Financial Resource Strain: Not on file  Food Insecurity: Not on file  Transportation Needs: Not on file  Physical Activity: Not on file  Stress: Not on file  Social Connections:  Not on file    Tobacco Counseling Counseling given: Not Answered   Clinical Intake:                 Diabetic?No         Activities of Daily Living    07/20/2021    2:44 PM 01/12/2021    3:21 PM  In your present state of health, do you have any difficulty performing the following activities:  Hearing? 0 0  Vision? 0 0  Difficulty concentrating or making decisions? 0 0  Walking or climbing stairs? 0 1  Comment  hip pain  Dressing or bathing? 0 0  Doing errands, shopping? 0 1  Comment  Bus    Patient Care Team: Evlyn Kanner, MD as PCP - General (Internal Medicine)  Indicate any recent Medical Services you may  have received from other than [facility] providers in the past year (date may be approximate).     Assessment:   This is a routine wellness examination for Kenneth Hunt.  Hearing/Vision screen No results found.  Dietary issues and exercise activities discussed:     Goals Addressed   None   Depression Screen    08/02/2021    2:58 PM 07/20/2021    2:45 PM 02/06/2021    2:43 PM 01/12/2021    4:18 PM 10/21/2020    9:15 AM 10/17/2020   10:14 AM  PHQ 2/9 Scores  PHQ - 2 Score 0 1 2 5 1 6   PHQ- 9 Score  4 7 22  15     Fall Risk    08/02/2021    2:58 PM 07/20/2021    2:44 PM 02/06/2021    2:43 PM 01/12/2021    3:20 PM 10/21/2020    9:15 AM  Fall Risk   Falls in the past year? 0 0 0 1 0  Number falls in past yr:    0   Injury with Fall?    0   Risk for fall due to :  No Fall Risks  History of fall(s);Impaired balance/gait   Risk for fall due to: Comment    muscle   Follow up  Falls evaluation completed  Falls prevention discussed Falls evaluation completed    FALL RISK PREVENTION PERTAINING TO THE HOME:  Any stairs in or around the home? {YES/NO:21197} If so, are there any without handrails? {YES/NO:21197} Home free of loose throw rugs in walkways, pet beds, electrical cords, etc? {YES/NO:21197} Adequate lighting in your home to reduce risk of falls? {YES/NO:21197}  ASSISTIVE DEVICES UTILIZED TO PREVENT FALLS:  Life alert? {YES/NO:21197} Use of a cane, walker or w/c? {YES/NO:21197} Grab bars in the bathroom? {YES/NO:21197} Shower chair or bench in shower? {YES/NO:21197} Elevated toilet seat or a handicapped toilet? {YES/NO:21197}  TIMED UP AND GO:  Was the test performed? No .  Length of time to ambulate 10 feet: N/A sec.   {Appearance of [id number]  Cognitive Function:        Immunizations Immunization History  Administered Date(s) Administered   Hepb-cpg 08/24/2019   Influenza,inj,Quad PF,6+ Mos 06/27/2020, 07/20/2021   Influenza-Unspecified 05/14/2019   Janssen  (J&J) SARS-COV-2 Vaccination 11/03/2019   PFIZER Comirnaty(Gray Top)Covid-19 Tri-Sucrose Vaccine 02/06/2021   Pfizer Covid-19 Vaccine Bivalent Booster 62yrs & up 08/02/2021   Tdap 10/17/2020   Vaccinia,smallpox Monkeypox Vaccine Live,pf 04/21/2021, 05/25/2021    TDAP status: Up to date  Flu Vaccine status: Up to date   Covid-19 vaccine status: Completed vaccines  Qualifies for Shingles Vaccine? {YES/NO:21197}  Zostavax completed {  YES/NO:21197}  {Shingrix Completed?:2101804}  Screening Tests Health Maintenance  Topic Date Due   INFLUENZA VACCINE  03/13/2022   TETANUS/TDAP  10/18/2030   COVID-19 Vaccine  Completed   Hepatitis C Screening  Completed   HIV Screening  Completed   HPV VACCINES  Aged Out    Health Maintenance  There are no preventive care reminders to display for this patient.  {Colorectal cancer screening:2101809}  Lung Cancer Screening: (Low Dose CT Chest recommended if Age 89-80 years, 30 pack-year currently smoking OR have quit w/in 15years.) does not qualify.   Lung Cancer Screening Referral: N/A  Additional Screening:  Hepatitis C Screening: does not qualify; Completed 08/02/2021  Vision Screening: Recommended annual ophthalmology exams for early detection of glaucoma and other disorders of the eye. Is the patient up to date with their annual eye exam?  {YES/NO:21197} Who is the provider or what is the name of the office in which the patient attends annual eye exams? *** If pt is not established with a provider, would they like to be referred to a provider to establish care? {YES/NO:21197}.   Dental Screening: Recommended annual dental exams for proper oral hygiene  Community Resource Referral / Chronic Care Management: CRR required this visit?  No   CCM required this visit?  No      Plan:     I have personally reviewed and noted the following in the patient's chart:   Medical and social history Use of alcohol, tobacco or illicit drugs   Current medications and supplements including opioid prescriptions. Patient is not currently taking opioid prescriptions. Functional ability and status Nutritional status Physical activity Advanced directives List of other physicians Hospitalizations, surgeries, and ER visits in previous 12 months Vitals Screenings to include cognitive, depression, and falls Referrals and appointments  In addition, I have reviewed and discussed with patient certain preventive protocols, quality metrics, and best practice recommendations. A written personalized care plan for preventive services as well as general preventive health recommendations were provided to patient.     Cala Bradford, CMA   12/22/2021   Nurse Notes: Non Face-to-Face minute visit

## 2021-12-22 NOTE — Telephone Encounter (Signed)
Called patient x4 and lvm to complete medicare awv patient will need to be rescheduled. ?

## 2021-12-25 NOTE — Progress Notes (Signed)
Called patient x4 and lvm to complete medicare awv patient will need to be rescheduled. ?

## 2021-12-27 DIAGNOSIS — F1521 Other stimulant dependence, in remission: Secondary | ICD-10-CM | POA: Diagnosis not present

## 2022-01-02 DIAGNOSIS — F1521 Other stimulant dependence, in remission: Secondary | ICD-10-CM | POA: Diagnosis not present

## 2022-01-10 DIAGNOSIS — F1520 Other stimulant dependence, uncomplicated: Secondary | ICD-10-CM | POA: Diagnosis not present

## 2022-01-15 ENCOUNTER — Other Ambulatory Visit: Payer: Self-pay

## 2022-01-15 DIAGNOSIS — G540 Brachial plexus disorders: Secondary | ICD-10-CM

## 2022-01-15 MED ORDER — GABAPENTIN 400 MG PO CAPS: 400.0000 mg | ORAL_CAPSULE | Freq: Three times a day (TID) | ORAL | 2 refills | Status: DC

## 2022-01-16 DIAGNOSIS — F1521 Other stimulant dependence, in remission: Secondary | ICD-10-CM | POA: Diagnosis not present

## 2022-01-17 ENCOUNTER — Other Ambulatory Visit: Payer: Self-pay | Admitting: *Deleted

## 2022-01-17 DIAGNOSIS — F1521 Other stimulant dependence, in remission: Secondary | ICD-10-CM | POA: Diagnosis not present

## 2022-01-17 DIAGNOSIS — G540 Brachial plexus disorders: Secondary | ICD-10-CM

## 2022-01-17 MED ORDER — DULOXETINE HCL 30 MG PO CPEP: ORAL_CAPSULE | ORAL | 2 refills | Status: DC

## 2022-01-24 DIAGNOSIS — F1521 Other stimulant dependence, in remission: Secondary | ICD-10-CM | POA: Diagnosis not present

## 2022-01-24 DIAGNOSIS — Z79899 Other long term (current) drug therapy: Secondary | ICD-10-CM | POA: Diagnosis not present

## 2022-01-31 DIAGNOSIS — Z79899 Other long term (current) drug therapy: Secondary | ICD-10-CM | POA: Diagnosis not present

## 2022-01-31 DIAGNOSIS — F3132 Bipolar disorder, current episode depressed, moderate: Secondary | ICD-10-CM | POA: Diagnosis not present

## 2022-01-31 DIAGNOSIS — F1510 Other stimulant abuse, uncomplicated: Secondary | ICD-10-CM | POA: Diagnosis not present

## 2022-01-31 DIAGNOSIS — F1521 Other stimulant dependence, in remission: Secondary | ICD-10-CM | POA: Diagnosis not present

## 2022-02-06 ENCOUNTER — Telehealth: Payer: Self-pay | Admitting: *Deleted

## 2022-02-06 NOTE — Telephone Encounter (Signed)
Call from pt stating he gets Aristada injections for bi-polar at an office in [facility]. Wants to know if he could get them here instead of traveling to [place]. I told the pt we do not have this medication in our office.

## 2022-02-07 ENCOUNTER — Telehealth: Payer: Self-pay | Admitting: Behavioral Health

## 2022-02-07 ENCOUNTER — Encounter: Payer: Self-pay | Admitting: Infectious Diseases

## 2022-02-07 ENCOUNTER — Ambulatory Visit (INDEPENDENT_AMBULATORY_CARE_PROVIDER_SITE_OTHER): Payer: Medicare (Managed Care) | Admitting: Infectious Diseases

## 2022-02-07 ENCOUNTER — Other Ambulatory Visit: Payer: Self-pay

## 2022-02-07 VITALS — BP 105/70 | HR 81 | Temp 98.3°F | Wt 175.0 lb

## 2022-02-07 DIAGNOSIS — F1521 Other stimulant dependence, in remission: Secondary | ICD-10-CM

## 2022-02-07 DIAGNOSIS — B20 Human immunodeficiency virus [HIV] disease: Secondary | ICD-10-CM

## 2022-02-07 DIAGNOSIS — F1990 Other psychoactive substance use, unspecified, uncomplicated: Secondary | ICD-10-CM | POA: Diagnosis not present

## 2022-02-07 DIAGNOSIS — Z23 Encounter for immunization: Secondary | ICD-10-CM | POA: Diagnosis not present

## 2022-02-07 DIAGNOSIS — Z79899 Other long term (current) drug therapy: Secondary | ICD-10-CM | POA: Diagnosis not present

## 2022-02-07 MED ORDER — BIKTARVY 50-200-25 MG PO TABS: 1.0000 | ORAL_TABLET | Freq: Every day | ORAL | 11 refills | Status: DC

## 2022-02-07 NOTE — Telephone Encounter (Signed)
Pt avail for call today. He has secured a Provider @ [facility] to administer his Julianne Rice inj that he receives q55mos. Pt is happy to have a more convenient location since his transportation is limited.  Pt is cont'g to care for himself well. He attends [facility] & contributes much to this resource for himself & others. Pt is following his care for his health & his Recovery.  Dr. Monna Fam

## 2022-02-07 NOTE — Addendum Note (Signed)
Addended by: Philippa Chester on: 02/07/2022 03:36 PM   Modules accepted: Orders

## 2022-02-07 NOTE — Progress Notes (Signed)
Name: Kenneth Hunt  DOB: 05-22-1981 MRN: [id number] PCP: Sanjuan Dame, MD     Brief Narrative:  Kenneth Hunt is a 41 y.o. male with well controlled HIV. Dx   Transferred care from [place] in [place]  [phone])  VL < 20 recently with CD4 ~340 on Biktarvy  HIV Risk: MSM History of OIs: none known  Intake Labs 07/23/2019: Hep B sAg (-), sAb (-), cAb (-); Hep A (immune), Hep C (-) Quantiferon (-) HLA B*5701 (-) G6PD: () Toxo IgG: (-)   Previous Regimens: Biktarvy   Genotypes: None on record   Subjective:   Chief Complaint  Patient presents with   Follow-up      HPI: Kenneth Hunt is here for 106mfolow up. He has been doing well on Biktarvy and no missed doses of medications. No concern over side effects. Has insurance access with [employer].   Asking about Shingrix vaccine - has had a rash in the past that has come up a few times on his back, unilateral, always same spot. It was quite itchy. May have been triggered by meth injection but it is always in same spot and same quality. He is very interested in getting shingrix if available for him.   Also would like to get COVID bialent booster today. Last dose 423mgo.   Working with [employer] strict with drug use. Had a positive UDS recently.  Injecting meth once a month- not having trouble with cravings but uses more as a "treat" for himself.      Review of Systems  Constitutional:  Negative for appetite change, chills, fatigue, fever and unexpected weight change.  Eyes:  Negative for visual disturbance.  Respiratory:  Negative for cough and shortness of breath.   Cardiovascular:  Negative for chest pain and leg swelling.  Gastrointestinal:  Negative for abdominal pain, diarrhea and nausea.  Genitourinary:  Negative for dysuria, genital sores and penile discharge.  Musculoskeletal:  Negative for joint swelling.  Skin:  Negative for color change and rash.  Neurological:  Negative  for dizziness and headaches.  Hematological:  Negative for adenopathy.  Psychiatric/Behavioral:  Negative for sleep disturbance. The patient is not nervous/anxious.      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 brain and cervical spine showed bilateral neural foraminal cysts C4-C5, C5-C6, C6-C7, brain within normal limits.  - continue cymbalta, flexeril, neurontin - [facility]   Depression    History of syphilis 12/30/2017   RPR 1:1 12/25/17 s/p adequate treatment   HIV [place] virus infection) [place]   TOS (thoracic outlet syndrome)     Outpatient Medications Prior to Visit  Medication Sig Dispense Refill   DULoxetine (CYMBALTA) 30 MG capsule TAKE 3 CAPSULES(90 MG) BY MOUTH DAILY 90 capsule 2   gabapentin (NEURONTIN) 400 MG capsule Take 1 capsule (400 mg total) by mouth 3 (three) times daily. 90 capsule 2   bictegravir-emtricitabine-tenofovir AF (BIKTARVY) 50-200-25 MG TABS tablet Take 1 tablet by mouth daily. 30 tablet 5   No facility-administered medications prior to visit.     Allergies  Allergen Reactions   Haloperidol Other (See Comments)    Tardive dyskinesia    Social History   Tobacco Use   Smoking status: Never   Smokeless tobacco: Never  Vaping Use   Vaping Use: Never used  Substance Use Topics  Alcohol use: Not Currently   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, Methamphetamines    Comment: last used last nght    Social History   Substance and Sexual Activity  Sexual Activity Yes   Partners: Male   Comment: declined condoms   Social History   Substance and Sexual Activity  Sexual Activity Yes   Partners: Male   Comment: declined condoms    Upstream - 02/07/22 1506       Pregnancy Intention Screening   Does the patient want to become pregnant in the next year? No    Does the patient's partner want  to become pregnant in the next year? No    Would the patient like to discuss contraceptive options today? No              Objective:   Vitals:   02/07/22 1506  BP: 105/70  Pulse: 81  Temp: 98.3 F (36.8 C)  TempSrc: Temporal  Weight: 175 lb (79.4 kg)   Body mass index is 31 kg/m.  Physical Exam Vitals reviewed.  Constitutional:      Appearance: He is well-developed.     Comments: Seated comfortably in chair during visit.   HENT:     Mouth/Throat:     Dentition: Normal dentition. No dental abscesses.  Cardiovascular:     Rate and Rhythm: Normal rate and regular rhythm.     Heart sounds: Normal heart sounds.  Pulmonary:     Effort: Pulmonary effort is normal.     Breath sounds: Normal breath sounds.  Abdominal:     General: There is no distension.     Palpations: Abdomen is soft.     Tenderness: There is no abdominal tenderness.  Lymphadenopathy:     Cervical: No cervical adenopathy.  Skin:    General: Skin is warm and dry.     Findings: No rash.  Neurological:     Mental Status: He is alert and oriented to person, place, and time.  Psychiatric:        Judgment: Judgment normal.     Comments: In good spirits today and engaged in care discussion.      Lab Results Lab Results  Component Value Date   WBC 14.6 (H) 11/12/2020   HGB 14.5 11/12/2020   HCT 43.3 11/12/2020   MCV 93.5 11/12/2020   PLT 240 11/12/2020    Lab Results  Component Value Date   CREATININE 1.07 11/12/2020   BUN 16 11/12/2020   NA 139 11/12/2020   K 5.4 (H) 11/12/2020   CL 107 11/12/2020   CO2 23 11/12/2020    Lab Results  Component Value Date   ALT 32 11/12/2020   AST 32 11/12/2020   ALKPHOS 67 11/12/2020   BILITOT 0.9 11/12/2020    Lab Results  Component Value Date   CHOL 155 07/23/2019   HDL 55 07/23/2019   LDLCALC 85 07/23/2019   TRIG 60 07/23/2019   CHOLHDL 2.8 07/23/2019   HIV 1 RNA Quant (Copies/mL)  Date Value  08/02/2021 <20 (H)  02/06/2021 <20 (H)   06/27/2020 <20 (H)   CD4 T Cell Abs (/uL)  Date Value  08/02/2021 989  02/06/2021 595  06/27/2020 859     Assessment & Plan:   Problem List Items Addressed This Visit       High   Human immunodeficiency virus (HIV) disease ([place]) - Primary (Chronic)    Very well controlled on once daily Biktarvy. No concerns with  access or adherence to medication. He is tolerating the medication well without side effects. No drug interactions identified.  No changes with health insurance. No dental needs today.  No concern over anxious/depressed mood.  Sexual health discussed including screening needs.   Has primary care team for health maintenance / screenings.   New recommendations for anal cancer screening for PLWH will be out soon - Needs anal cancer screening at next office visit - will help collect a swab sample and send for testing. This will be annually going forward.   Return in about 6 months (around 08/09/2022).       Relevant Medications   bictegravir-emtricitabine-tenofovir AF (BIKTARVY) 50-200-25 MG TABS tablet   Other Relevant Orders   COMPLETE METABOLIC PANEL WITH GFR   CBC   T-helper cells (CD4) count (not at [facility])   HIV 1 RNA quant-no reflex-bld   RPR     Medium    Injection of illicit drug within last 12 months    Screening annually for hepatitis C.  Hepatitis B #2 vaccine today. Will follow up antibody titer at next lab draw to ensure immunity in 93m       Methamphetamine use disorder, moderate, in early remission ([facility]    Discussed today - Currently working with [employer] [facility] Does have occasional relapses. Spent time discussing today. May have to go back for inpatient rehab at some point.        DDoren Custard MSN, NP-C RTexas [facility] Hospitalfor Infectious DJunctionPager: [phone]: [phone] 02/07/22  3:31 PM

## 2022-02-07 NOTE — Assessment & Plan Note (Signed)
Screening annually for hepatitis C.  Hepatitis B #2 vaccine today. Will follow up antibody titer at next lab draw to ensure immunity in 80m.

## 2022-02-07 NOTE — Assessment & Plan Note (Addendum)
>>  ASSESSMENT AND PLAN FOR METHAMPHETAMINE USE DISORDER, MODERATE, IN EARLY REMISSION (HCC) WRITTEN ON 02/07/2022  3:26 PM BY Jamine Highfill, Sharion Davidson, NP  Discussed today - Currently working with [employer] and [employer]. Does have occasional relapses. Spent time discussing today. May have to go back for inpatient rehab at some point.    >>ASSESSMENT AND PLAN FOR INJECTION OF ILLICIT DRUG WITHIN LAST 12 MONTHS WRITTEN ON 02/07/2022  3:25 PM BY Patrici Minnis N, NP  Screening annually for hepatitis C.  Hepatitis B #2 vaccine today. Will follow up antibody titer at next lab draw to ensure immunity in 39m.

## 2022-02-07 NOTE — Telephone Encounter (Signed)
Dr Monna Fam, it would great if you could contact the pt. Thanks

## 2022-02-07 NOTE — Assessment & Plan Note (Signed)
Very well controlled on once daily Biktarvy. No concerns with access or adherence to medication. He is tolerating the medication well without side effects. No drug interactions identified.  No changes with health insurance. No dental needs today.  No concern over anxious/depressed mood.  Sexual health discussed including screening needs.   Has primary care team for health maintenance / screenings.   New recommendations for anal cancer screening for PLWH will be out soon - Needs anal cancer screening at next office visit - will help collect a swab sample and send for testing. This will be annually going forward.   Return in about 6 months (around 08/09/2022).

## 2022-02-07 NOTE — Patient Instructions (Signed)
We gave you your pneumonia booster today - last one!  Would recommend we get your meningitis booster in 2025 Flu vaccine in the fall (around October)  Refills have been sent in for your biktarvy.   See you in 6 months!

## 2022-02-08 LAB — T-HELPER CELLS (CD4) COUNT (NOT AT ARMC)
CD4 % Helper T Cell: 33 % (ref 33–65)
CD4 T Cell Abs: 635 /uL (ref 400–1790)

## 2022-02-10 LAB — CBC
HCT: 44 % (ref 38.5–50.0)
Hemoglobin: 15.5 g/dL (ref 13.2–17.1)
MCH: 31.7 pg (ref 27.0–33.0)
MCHC: 35.2 g/dL (ref 32.0–36.0)
MCV: 90 fL (ref 80.0–100.0)
MPV: 10 fL (ref 7.5–12.5)
Platelets: 287 10*3/uL (ref 140–400)
RBC: 4.89 10*6/uL (ref 4.20–5.80)
RDW: 13.2 % (ref 11.0–15.0)
WBC: 7 10*3/uL (ref 3.8–10.8)

## 2022-02-10 LAB — COMPLETE METABOLIC PANEL WITH GFR
AG Ratio: 1.5 (calc) (ref 1.0–2.5)
ALT: 16 U/L (ref 9–46)
AST: 15 U/L (ref 10–40)
Albumin: 4.3 g/dL (ref 3.6–5.1)
Alkaline phosphatase (APISO): 61 U/L (ref 36–130)
BUN: 11 mg/dL (ref 7–25)
CO2: 29 mmol/L (ref 20–32)
Calcium: 9.4 mg/dL (ref 8.6–10.3)
Chloride: 105 mmol/L (ref 98–110)
Creat: 0.94 mg/dL (ref 0.60–1.29)
Globulin: 2.9 g/dL (calc) (ref 1.9–3.7)
Glucose, Bld: 105 mg/dL — ABNORMAL HIGH (ref 65–99)
Potassium: 4.2 mmol/L (ref 3.5–5.3)
Sodium: 140 mmol/L (ref 135–146)
Total Bilirubin: 0.5 mg/dL (ref 0.2–1.2)
Total Protein: 7.2 g/dL (ref 6.1–8.1)
eGFR: 103 mL/min/{1.73_m2} (ref >=60)

## 2022-02-10 LAB — RPR: RPR Ser Ql: NONREACTIVE

## 2022-02-10 LAB — HIV-1 RNA QUANT-NO REFLEX-BLD
HIV 1 RNA Quant: NOT DETECTED Copies/mL
HIV-1 RNA Quant, Log: NOT DETECTED Log cps/mL

## 2022-02-15 DIAGNOSIS — F1521 Other stimulant dependence, in remission: Secondary | ICD-10-CM | POA: Diagnosis not present

## 2022-02-21 ENCOUNTER — Telehealth: Payer: Self-pay

## 2022-02-21 NOTE — Telephone Encounter (Signed)
Patient called and wanted ID provider to be aware the will be enrolling into a rehab program [facility] located in [place]. Patient has requested last office note and lab to take with him during enrollment. Patient will come by the office to pick up office notes and labs.  Staff acknowledged that we are proud of what patient is doing by taking the first steps to recovery. Patient was very appreciative of phone conversation. Routing to provider to make aware.  Valarie Cones

## 2022-03-09 ENCOUNTER — Encounter: Payer: Self-pay | Admitting: Infectious Diseases

## 2022-06-28 ENCOUNTER — Ambulatory Visit (INDEPENDENT_AMBULATORY_CARE_PROVIDER_SITE_OTHER): Payer: Medicare (Managed Care) | Admitting: Student

## 2022-06-28 ENCOUNTER — Other Ambulatory Visit: Payer: Self-pay

## 2022-06-28 ENCOUNTER — Ambulatory Visit: Payer: Medicare (Managed Care)

## 2022-06-28 ENCOUNTER — Encounter: Payer: Self-pay | Admitting: Student

## 2022-06-28 VITALS — BP 119/60 | HR 87 | Temp 97.1°F | Ht 63.0 in | Wt 182.2 lb

## 2022-06-28 DIAGNOSIS — G540 Brachial plexus disorders: Secondary | ICD-10-CM | POA: Diagnosis not present

## 2022-06-28 DIAGNOSIS — F1521 Other stimulant dependence, in remission: Secondary | ICD-10-CM | POA: Diagnosis not present

## 2022-06-28 NOTE — Patient Instructions (Signed)
[name], it was a pleasure seeing you today!  Today we discussed: - I am glad things are going so well for you! Please make sure to follow-up with Kenneth Hunt in the ID clinic for lab work and routine follow-up. Please let us know if you need any refills! We will see you in six months.   Follow-up: 6 months   Please make sure to arrive 15 minutes prior to your next appointment. If you arrive late, you may be asked to reschedule.   We look forward to seeing you next time. Please call our clinic at [phone] if you have any questions or concerns. The best time to call is Monday-Friday from 9am-4pm, but there is someone available 24/7. If after hours or the weekend, call the main hospital number and ask for the Internal Medicine Resident On-Call. If you need medication refills, please notify your pharmacy one week in advance and they will send [place] a request.  Thank you for letting us take part in your care. Wishing you the best!  Thank you, Evlyn Kanner, MD

## 2022-07-01 NOTE — Assessment & Plan Note (Addendum)
Mr. Suddreth is doing well on his current medications. His neuropathy is well-controlled on Cymbalta and gabapentin. Last labs with ID in June with normal renal function. He is due to return in the next couple months, will defer blood work to that visit. We will also obtain records from where he had this procedure done to make sure no maintenance monitoring is needed.  - Continue Cymbalta 90mg  daily - Continue gabapentin 400mg  three times daily - Follow-up renal function with ID's labs in a couple months - Records request

## 2022-07-01 NOTE — Progress Notes (Signed)
   CC: follow-up  HPI:  Kenneth Hunt is a 41 y.o. person with medical history as below presenting to [name] for follow-up  Please see problem-based list for further details, assessments, and plans.  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 MRI brain and cervical spine showed bilateral neural foraminal cysts C4-C5, C5-C6, C6-C7, brain within normal limits.  - continue cymbalta, flexeril, neurontin - f/u with Dr. Debbra [facility] neurology 03/28/19   Depression    History of syphilis 12/30/2017   RPR 1:1 12/25/17 s/p adequate treatment   HIV (human immunodeficiency virus infection) (HCC)    TOS (thoracic outlet syndrome)    Review of Systems:  As per HPI  Physical Exam:  Vitals:   06/28/22 1440  BP: 119/60  Pulse: 87  Temp: (!) 97.1 F (36.2 C)  TempSrc: Oral  SpO2: 99%  Weight: 182 lb 3.2 oz (82.6 kg)  Height: 5\' 3"  (1.6 m)   General: Resting comfortably in no acute distress CV: Regular rate, rhythm. No murmurs appreciated. Warm extremities. Pulm: Normal work of breathing on room air. Clear to auscultation bilaterally.  MSK: Normal bulk, tone. No peripheral edema noted. Skin: Warm, dry. No rashes or lesions appreciated. Neuro: Awake, alert, conversing appropriately. Grossly non-focal. Psych: Normal mood, affect, speech.   Assessment & Plan:   Methamphetamine use disorder, moderate, in early remission [facility]) Kenneth Hunt reports he recently went to rehabilitation and has been doing well since. Reports last use was ~3 months ago. Congratulated him on his success and encouraged him to let [place] know if he begins to feel cravings.   Thoracic outlet syndrome Kenneth Hunt is doing well on his current medications. His neuropathy is well-controlled on Cymbalta and gabapentin. Last labs with ID in June with normal renal function. He is due to return in the next couple months, will  defer blood work to that visit. We will also obtain records from where he had this procedure done to make sure no maintenance monitoring is needed.  - Continue Cymbalta 90mg  daily - Continue gabapentin 400mg  three times daily - Follow-up renal function with ID's labs in a couple months - Records request  Patient discussed with Dr. July, MD Internal Medicine PGY-3 Pager: [phone]

## 2022-07-01 NOTE — Assessment & Plan Note (Signed)
Kenneth Hunt reports he recently went to rehabilitation and has been doing well since. Reports last use was ~3 months ago. Congratulated him on his success and encouraged him to let us know if he begins to feel cravings.

## 2022-07-02 NOTE — Progress Notes (Signed)
Internal Medicine Clinic Attending  Case discussed with Dr. Marijo Conception  At the time of the visit.  We reviewed the resident's history and exam and pertinent patient test results.  I agree with the assessment, diagnosis, and plan of care documented in the resident's note.   Patient has a history of thoracic outlet syndrome s/p surgical removal of a tumor many years ago in [place]. Unclear if this was benign or malignant, but likely benign as he is not following with oncology. We are attempting to obtain records. Rest per resident note.

## 2022-07-23 ENCOUNTER — Other Ambulatory Visit: Payer: Self-pay | Admitting: Student

## 2022-07-23 DIAGNOSIS — G540 Brachial plexus disorders: Secondary | ICD-10-CM

## 2022-07-25 DIAGNOSIS — F1510 Other stimulant abuse, uncomplicated: Secondary | ICD-10-CM | POA: Diagnosis not present

## 2022-07-25 DIAGNOSIS — F3132 Bipolar disorder, current episode depressed, moderate: Secondary | ICD-10-CM | POA: Diagnosis not present

## 2022-08-20 ENCOUNTER — Other Ambulatory Visit: Payer: Self-pay

## 2022-08-20 ENCOUNTER — Encounter: Payer: Self-pay | Admitting: Infectious Diseases

## 2022-08-20 ENCOUNTER — Other Ambulatory Visit (HOSPITAL_COMMUNITY)
Admission: RE | Admit: 2022-08-20 | Discharge: 2022-08-20 | Disposition: A | Discharge status: Home / Self Care | Payer: Medicare (Managed Care) | Source: Ambulatory Visit | Attending: Infectious Diseases | Admitting: Infectious Diseases

## 2022-08-20 ENCOUNTER — Ambulatory Visit (INDEPENDENT_AMBULATORY_CARE_PROVIDER_SITE_OTHER): Payer: Medicare (Managed Care) | Admitting: Infectious Diseases

## 2022-08-20 VITALS — BP 124/77 | HR 97 | Temp 97.9°F | Ht 63.5 in | Wt 181.0 lb

## 2022-08-20 DIAGNOSIS — Z21 Asymptomatic human immunodeficiency virus [HIV] infection status: Secondary | ICD-10-CM | POA: Insufficient documentation

## 2022-08-20 DIAGNOSIS — B20 Human immunodeficiency virus [HIV] disease: Secondary | ICD-10-CM

## 2022-08-20 DIAGNOSIS — Z0000 Encounter for general adult medical examination without abnormal findings: Secondary | ICD-10-CM

## 2022-08-20 DIAGNOSIS — R5383 Other fatigue: Secondary | ICD-10-CM

## 2022-08-20 NOTE — Patient Instructions (Addendum)
Continue your Bison everyday.   Will update you with a phone call on results we collected today.   Follow up in 6 - 8 months

## 2022-08-20 NOTE — Assessment & Plan Note (Signed)
Very well controlled on once daily Biktarvy. No concerns with access or adherence to medication. They are tolerating the medication well without side effects. No drug interactions identified. Pertinent lab tests ordered today.  No changes to insurance coverage.  No dental needs today.  Will look for specific sexual health counselor that may be of help to him and contact him back.  Sexual health and family planning discussed - anal cancer screening as above. STI screening offered and deferred.  Vaccines updated today - see health maintenance section.   RTC for routine care 6-8 months

## 2022-08-20 NOTE — Assessment & Plan Note (Signed)
Up to date on all vaccines.  We discussed anal cancer screen today via anal pap smear. He elected to self collect. Procedure described in detail and sent for HPV. Next steps discussed should this return abnormal. Recommend minimum yearly screening.

## 2022-08-20 NOTE — Assessment & Plan Note (Addendum)
Has noticed some post prandial fatigue over the last 1 month. No associated symptoms for hypoglycemia reported during our discussion. Sounds like he could push hydration a bit more during the morning hours which could certainly contribute to afternoon fatigue.  No new medications to explain it. He takes meds in AM/PM so no explanation there.  Will screen A1c to ensure no DM. If he were to be struggling with post prandial hypoglycemia I feel like he would have symptoms that were more frequently throughout the day.

## 2022-08-20 NOTE — Progress Notes (Addendum)
Name: Kenneth Hunt  DOB: [birth year] MRN: [id number] PCP: Evlyn Kanner, MD     Brief Narrative:  Arlander Fridman is a 42 y.o. male with well controlled HIV.  Transferred care from [facility] in [place]  [phone])  VL < 20 recently with CD4 ~340 on Biktarvy  HIV Risk: MSM History of OIs: none known  Intake Labs 07/23/2019: Hep B sAg (-), sAb (-), cAb (-); Hep A (immune), Hep C (-) Quantiferon (-) HLA B*5701 (-) G6PD: () Toxo IgG: (-)   Previous Regimens: Biktarvy   Genotypes: None on record    Subjective:   Chief Complaint  Patient presents with   Follow-up    B20       HPI: Kenneth Hunt is here for 41m folow up. Continues to do well on Biktarvy everyday without any significant lapses in doses and no concern for side effects.   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   No sexual health concerns. Has not had anal cancer screen in the past; mainly receptive partner.   He has noticed that he is very tired after afternoon meal - just tired to the point of where he would like to nap but does not. No changes in urinary frequency, no increased thirst. Takes meds in AM and PMs. Nothing with lunch.  Happens 30 - 60 min after this meal. Urine is amber in color usually.  Has been going on for about 1 month now. No associated dizziness or sweating, palpitations etc.     Review of Systems  Constitutional:  Negative for appetite change, chills, fatigue, fever and unexpected weight change.  Eyes:  Negative for visual disturbance.  Respiratory:  Negative for cough and shortness of breath.   Cardiovascular:  Negative for chest pain and leg swelling.  Gastrointestinal:  Negative for abdominal pain, diarrhea and nausea.  Genitourinary:  Negative for dysuria, genital sores and penile discharge.  Musculoskeletal:  Negative for joint swelling.  Skin:  Negative for color change and rash.   Neurological:  Negative for dizziness and headaches.  Hematological:  Negative for adenopathy.  Psychiatric/Behavioral:  Negative for sleep disturbance. The patient is not nervous/anxious.      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 MRI brain and cervical spine showed bilateral neural foraminal cysts C4-C5, C5-C6, C6-C7, brain within normal limits.  - continue cymbalta, flexeril, neurontin - f/u with Dr. 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     bictegravir-emtricitabine-tenofovir AF (BIKTARVY) 50-200-25 MG TABS tablet Take 1 tablet by mouth daily. 30 tablet 11   DULoxetine (CYMBALTA) 30 MG capsule TAKE 3 CAPSULES(90 MG) BY MOUTH DAILY 90 capsule 2   gabapentin (NEURONTIN) 400 MG capsule TAKE 1 CAPSULE(400 MG) BY MOUTH THREE TIMES DAILY 90 capsule 2   No facility-administered medications prior to visit.     Allergies  Allergen Reactions   Haloperidol Other (See Comments)    Tardive dyskinesia    Social History   Tobacco Use   Smoking status: Never   Smokeless tobacco: Never  Vaping Use   Vaping Use: Never used  Substance Use Topics   Alcohol use: Not Currently   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, Methamphetamines    Comment: last used last nght    Social History   Substance and Sexual Activity  Sexual Activity Yes   Partners: Male   Comment: declined condoms   Social History   Substance and Sexual Activity  Sexual Activity Yes   Partners: Male   Comment: declined condoms       Objective:   Vitals:   08/20/22 1350  BP: 124/77   Pulse: 97  Temp: 97.9 F (36.6 C)  TempSrc: Temporal  Weight: 181 lb (82.1 kg)  Height: 5' 3.5" (1.613 m)   Body mass index is 31.56 kg/m.  Physical Exam Vitals reviewed.  Constitutional:      Appearance: He is well-developed.     Comments: Seated comfortably in chair during visit.   HENT:     Mouth/Throat:     Dentition: Normal dentition. No dental abscesses.  Cardiovascular:     Rate and Rhythm: Normal rate and regular rhythm.     Heart sounds: Normal heart sounds.  Pulmonary:     Effort: Pulmonary effort is normal.     Breath sounds: Normal breath sounds.  Abdominal:     General: There is no distension.     Palpations: Abdomen is soft.     Tenderness: There is no abdominal tenderness.  Lymphadenopathy:     Cervical: No cervical adenopathy.  Skin:    General: Skin is warm and dry.     Findings: No rash.  Neurological:     Mental Status: He is alert and oriented to person, place, and time.  Psychiatric:        Judgment: Judgment normal.     Comments: In good spirits today and engaged in care discussion.      Lab Results Lab Results  Component Value Date   WBC 7.9 08/20/2022   HGB 16.1 08/20/2022   HCT 47.8 08/20/2022   MCV 93 08/20/2022   PLT 313 08/20/2022    Lab Results  Component Value Date   CREATININE 0.94 02/07/2022   BUN 11 02/07/2022   NA 140 02/07/2022   K 4.2 02/07/2022   CL 105 02/07/2022   CO2 29 02/07/2022    Lab Results  Component Value Date   ALT 16 02/07/2022   AST 15 02/07/2022   ALKPHOS 67 11/12/2020   BILITOT 0.5 02/07/2022    Lab Results  Component Value Date   CHOL 155 07/23/2019   HDL 55 07/23/2019   LDLCALC 85 07/23/2019   TRIG 60 07/23/2019   CHOLHDL 2.8 07/23/2019   HIV 1 RNA Quant (Copies/mL)  Date Value  08/20/2022 Not Detected  02/07/2022 Not Detected  08/02/2021 <20 (H)   CD4 T Cell Abs (/uL)  Date Value  02/07/2022 635  08/02/2021 989  02/06/2021 595     Assessment & Plan:   Problem List Items  Addressed This Visit       High   Asymptomatic HIV infection, CD4 >=500 ([place]) - Primary    Very well controlled on once daily Biktarvy. No concerns with access or adherence to medication. They are tolerating the medication well without side effects. No drug interactions identified. Pertinent lab tests ordered today.  No changes to insurance coverage.  No dental needs today.  Will look for specific sexual health counselor that may be of help to him  and contact him back.  Sexual health and family planning discussed - anal cancer screening as above. STI screening offered and deferred.  Vaccines updated today - see health maintenance section.   RTC for routine care 6-8 months         Unprioritized   Healthcare maintenance    Up to date on all vaccines.  We discussed anal cancer screen today via anal pap smear. He elected to self collect. Procedure described in detail and sent for HPV. Next steps discussed should this return abnormal. Recommend minimum yearly screening.       Fatigue    Has noticed some post prandial fatigue over the last 1 month. No associated symptoms for hypoglycemia reported during our discussion. Sounds like he could push hydration a bit more during the morning hours which could certainly contribute to afternoon fatigue.  No new medications to explain it. He takes meds in AM/PM so no explanation there.  Will screen A1c to ensure no DM. If he were to be struggling with post prandial hypoglycemia I feel like he would have symptoms that were more frequently throughout the day.         Rexene Alberts, MSN, NP-C [facility]  [email or web address] Pager: [phone] Office: [phone] [facility] Main Line: [phone] *Secure Chat Communication Welcome   08/27/22  1:28 PM

## 2022-08-22 LAB — HELPER T-LYMPH-CD4 (ARMC ONLY)
% CD 4 Pos. Lymph.: 32.6 % (ref 30.8–58.5)
Absolute CD 4 Helper: 619 /uL (ref 359–1519)
Basophils Absolute: 0 10*3/uL (ref 0.0–0.2)
Basos: 1 %
EOS (ABSOLUTE): 0 10*3/uL (ref 0.0–0.4)
Eos: 1 %
Hematocrit: 47.8 % (ref 37.5–51.0)
Hemoglobin: 16.1 g/dL (ref 13.0–17.7)
Immature Grans (Abs): 0 10*3/uL (ref 0.0–0.1)
Immature Granulocytes: 0 %
Lymphocytes Absolute: 1.9 10*3/uL (ref 0.7–3.1)
Lymphs: 25 %
MCH: 31.4 pg (ref 26.6–33.0)
MCHC: 33.7 g/dL (ref 31.5–35.7)
MCV: 93 fL (ref 79–97)
Monocytes Absolute: 0.5 10*3/uL (ref 0.1–0.9)
Monocytes: 6 %
Neutrophils Absolute: 5.4 10*3/uL (ref 1.4–7.0)
Neutrophils: 67 %
Platelets: 313 10*3/uL (ref 150–450)
RBC: 5.13 x10E6/uL (ref 4.14–5.80)
RDW: 11.9 % (ref 11.6–15.4)
WBC: 7.9 10*3/uL (ref 3.4–10.8)

## 2022-08-22 LAB — RPR: RPR Ser Ql: NONREACTIVE

## 2022-08-22 LAB — HEMOGLOBIN A1C
Hgb A1c MFr Bld: 5.6 % of total Hgb (ref <5.7)
Mean Plasma Glucose: 114 mg/dL
eAG (mmol/L): 6.3 mmol/L

## 2022-08-22 LAB — HIV-1 RNA QUANT-NO REFLEX-BLD
HIV 1 RNA Quant: NOT DETECTED Copies/mL
HIV-1 RNA Quant, Log: NOT DETECTED Log cps/mL

## 2022-08-23 LAB — CYTOLOGY - PAP

## 2022-08-27 NOTE — Progress Notes (Signed)
I adjusted the problem list to reflect "asymptomatic HIV infection, CD4 > 500"

## 2022-08-29 ENCOUNTER — Telehealth: Payer: Self-pay | Admitting: Infectious Diseases

## 2022-08-29 DIAGNOSIS — R85619 Unspecified abnormal cytological findings in specimens from anus: Secondary | ICD-10-CM

## 2022-08-29 DIAGNOSIS — R85612 Low grade squamous intraepithelial lesion on cytologic smear of anus (LGSIL): Secondary | ICD-10-CM

## 2022-08-29 NOTE — Telephone Encounter (Signed)
I called and spoke to Kenneth Hunt about his anal pap smear screening. 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 We discussed referral for anoscopy for further management today. Will send him information about Dr. Tammi Sou so he can expect call to set up appointment.   I welcomed and answered all questions.

## 2022-09-28 ENCOUNTER — Encounter: Payer: Medicare (Managed Care) | Admitting: Student

## 2022-10-02 ENCOUNTER — Encounter: Payer: Medicare (Managed Care) | Admitting: Student

## 2022-10-07 ENCOUNTER — Emergency Department (HOSPITAL_COMMUNITY): Payer: Medicare (Managed Care)

## 2022-10-07 ENCOUNTER — Encounter (HOSPITAL_COMMUNITY): Payer: Self-pay | Admitting: Emergency Medicine

## 2022-10-07 ENCOUNTER — Emergency Department (HOSPITAL_COMMUNITY)
Admission: EM | Admit: 2022-10-07 | Discharge: 2022-10-07 | Disposition: A | Discharge status: Home / Self Care | Payer: Medicare (Managed Care) | Attending: Emergency Medicine | Admitting: Emergency Medicine

## 2022-10-07 ENCOUNTER — Emergency Department (HOSPITAL_BASED_OUTPATIENT_CLINIC_OR_DEPARTMENT_OTHER): Payer: Medicare (Managed Care)

## 2022-10-07 ENCOUNTER — Other Ambulatory Visit: Payer: Self-pay

## 2022-10-07 ENCOUNTER — Encounter (HOSPITAL_COMMUNITY): Payer: Medicare (Managed Care)

## 2022-10-07 DIAGNOSIS — R269 Unspecified abnormalities of gait and mobility: Secondary | ICD-10-CM | POA: Diagnosis not present

## 2022-10-07 DIAGNOSIS — L0390 Cellulitis, unspecified: Secondary | ICD-10-CM | POA: Diagnosis not present

## 2022-10-07 DIAGNOSIS — Z21 Asymptomatic human immunodeficiency virus [HIV] infection status: Secondary | ICD-10-CM | POA: Insufficient documentation

## 2022-10-07 DIAGNOSIS — L03032 Cellulitis of left toe: Secondary | ICD-10-CM | POA: Diagnosis not present

## 2022-10-07 DIAGNOSIS — R200 Anesthesia of skin: Secondary | ICD-10-CM | POA: Diagnosis not present

## 2022-10-07 DIAGNOSIS — M79672 Pain in left foot: Secondary | ICD-10-CM | POA: Diagnosis not present

## 2022-10-07 LAB — CBC WITH DIFFERENTIAL/PLATELET
Abs Immature Granulocytes: 0.04 10*3/uL (ref 0.00–0.07)
Basophils Absolute: 0.1 10*3/uL (ref 0.0–0.1)
Basophils Relative: 1 %
Eosinophils Absolute: 0.1 10*3/uL (ref 0.0–0.5)
Eosinophils Relative: 1 %
HCT: 42.4 % (ref 39.0–52.0)
Hemoglobin: 14.7 g/dL (ref 13.0–17.0)
Immature Granulocytes: 0 %
Lymphocytes Relative: 37 %
Lymphs Abs: 3.3 10*3/uL (ref 0.7–4.0)
MCH: 31 pg (ref 26.0–34.0)
MCHC: 34.7 g/dL (ref 30.0–36.0)
MCV: 89.5 fL (ref 80.0–100.0)
Monocytes Absolute: 0.7 10*3/uL (ref 0.1–1.0)
Monocytes Relative: 8 %
Neutro Abs: 4.7 10*3/uL (ref 1.7–7.7)
Neutrophils Relative %: 53 %
Platelets: 321 10*3/uL (ref 150–400)
RBC: 4.74 MIL/uL (ref 4.22–5.81)
RDW: 12.4 % (ref 11.5–15.5)
WBC: 8.9 10*3/uL (ref 4.0–10.5)
nRBC: 0 % (ref 0.0–0.2)

## 2022-10-07 LAB — SEDIMENTATION RATE: Sed Rate: 9 mm/hr (ref 0–16)

## 2022-10-07 LAB — BASIC METABOLIC PANEL
Anion gap: 7 (ref 5–15)
BUN: 10 mg/dL (ref 6–20)
CO2: 25 mmol/L (ref 22–32)
Calcium: 9 mg/dL (ref 8.9–10.3)
Chloride: 103 mmol/L (ref 98–111)
Creatinine, Ser: 0.87 mg/dL (ref 0.61–1.24)
GFR, Estimated: >60 mL/min (ref >60)
Glucose, Bld: 102 mg/dL — ABNORMAL HIGH (ref 70–99)
Potassium: 3.7 mmol/L (ref 3.5–5.1)
Sodium: 135 mmol/L (ref 135–145)

## 2022-10-07 LAB — LACTIC ACID, PLASMA: Lactic Acid, Venous: 1.3 mmol/L (ref 0.5–1.9)

## 2022-10-07 LAB — C-REACTIVE PROTEIN: CRP: 0.9 mg/dL (ref <1.0)

## 2022-10-07 MED ORDER — IOHEXOL 350 MG/ML SOLN
100.0000 mL | Freq: Once | INTRAVENOUS | Status: AC | PRN
  Administered 2022-10-07: 100 mL via INTRAVENOUS

## 2022-10-07 MED ORDER — CEPHALEXIN 500 MG PO CAPS: 500.0000 mg | ORAL_CAPSULE | Freq: Four times a day (QID) | ORAL | 0 refills | Status: AC

## 2022-10-07 NOTE — ED Provider Notes (Signed)
[facility] Provider Note   CSN: [id number] Arrival date & time: 10/07/22  1641     History  Chief Complaint  Patient presents with   Foot Pain    Kenneth Hunt is a 42 y.o. male.  With past medical history of bipolar, HIV, substance abuse, thoracic outlet syndrome who presents to the emergency department with foot pain.  Patient states that he got new boots about 2 weeks ago.  He states that he was wearing them for about 7 hours a day and he felt like they were pinching his feet.  He states that 1 morning he woke up and had pain in both of his feet and arches that "felt like his arches had collapsed."  He states that that sensation went away.  States that then about a week ago he started noticing blistering to his left second and third toes.  He states that the blistering popped on his left third toe and then he started having discoloration.  He states that he is having mild pain but denies having numbness or tingling.  He denies having fever or chills.  He denies having any recent IV drug use, most recently months ago.  He denies having any pain in his leg.  He denies any abdominal pain.   Foot Pain       Home Medications Prior to Admission medications   Medication Sig Start Date End Date Taking? Authorizing Provider  cephALEXin (KEFLEX) 500 MG capsule Take 1 capsule (500 mg total) by mouth 4 (four) times daily for 7 days. 10/07/22 10/14/22 Yes Mickie Hillier, PA-C  ARISTADA [id number] MG/3.9ML prefilled syringe Inject 1,064 mg into the muscle once. 03/18/19   [provider]  bictegravir-emtricitabine-tenofovir AF (BIKTARVY) 50-200-25 MG TABS tablet Take 1 tablet by mouth daily. 02/07/22   E[place] Callas, NP  DULoxetine (CYMBALTA) 30 MG capsule TAKE 3 CAPSULES(90 MG) BY MOUTH DAILY 01/17/22   Sanjuan Dame, MD  gabapentin (NEURONTIN) 400 MG capsule TAKE 1 CAPSULE(400 MG) BY MOUTH THREE TIMES DAILY 07/24/22   Sanjuan Dame,  MD      Allergies    Haloperidol    Review of Systems   Review of Systems  Musculoskeletal:  Positive for gait problem.  Skin:  Positive for color change.  All other systems reviewed and are negative.   Physical Exam Updated Vital Signs BP (!) 111/43   Pulse 81   Temp 97.9 F (36.6 C)   Resp 18   Ht '5\' 3"'$  (1.6 m)   Wt 79.4 kg   SpO2 98%   BMI 31.00 kg/m  Physical Exam Vitals and nursing note reviewed.  Constitutional:      Appearance: Normal appearance. He is not ill-appearing.  HENT:     Head: Normocephalic.  Eyes:     General: No scleral icterus.    Extraocular Movements: Extraocular movements intact.  Cardiovascular:     Rate and Rhythm: Normal rate and regular rhythm.     Heart sounds: Normal heart sounds. No murmur heard.    Comments: Pulse discrepancy to the left lower extremity.  Right DP pulses 2+, easily palpable.  The left lower extremity is thready at best.  Bilateral lower extremities are warm and dry with less than 2s cap refill.  There is no discoloration to the left foot or lower leg. Pulmonary:     Effort: Pulmonary effort is normal. No respiratory distress.     Breath sounds: Normal breath sounds.  Abdominal:     General: Bowel sounds are normal.     Palpations: Abdomen is soft.  Skin:    General: Skin is warm and dry.     Capillary Refill: Capillary refill takes less than 2 seconds.     Findings: No rash.     Comments: No janeway lesions or osler's nodes  Neurological:     General: No focal deficit present.     Mental Status: He is alert and oriented to person, place, and time. Mental status is at baseline.  Psychiatric:        Mood and Affect: Mood normal.        Behavior: Behavior normal.     ED Results / Procedures / Treatments   Labs (all labs ordered are listed, but only abnormal results are displayed) Labs Reviewed  BASIC METABOLIC PANEL - Abnormal; Notable for the following components:      Result Value   Glucose, Bld 102 (*)     All other components within normal limits  CULTURE, BLOOD (ROUTINE X 2)  CULTURE, BLOOD (ROUTINE X 2)  LACTIC ACID, PLASMA  C-REACTIVE PROTEIN  CBC WITH DIFFERENTIAL/PLATELET  SEDIMENTATION RATE  CBC WITH DIFFERENTIAL/PLATELET  LACTIC ACID, PLASMA    EKG None  Radiology CT Angio Aortobifemoral W and/or Wo Contrast  Result Date: 10/07/2022 CLINICAL DATA:  Lower extremity arterial dissection, known or suspected ischemic toes. Left foot pain. EXAM: CT ANGIOGRAPHY OF ABDOMINAL AORTA WITH ILIOFEMORAL RUNOFF TECHNIQUE: Multidetector CT imaging of the abdomen, pelvis and lower extremities was performed using the standard protocol during bolus administration of intravenous contrast. Multiplanar CT image reconstructions and MIPs were obtained to evaluate the vascular anatomy. RADIATION DOSE REDUCTION: This exam was performed according to the departmental dose-optimization program which includes automated exposure control, adjustment of the mA and/or kV according to patient size and/or use of iterative reconstruction technique. CONTRAST:  149m OMNIPAQUE IOHEXOL 350 MG/ML SOLN COMPARISON:  None Available. FINDINGS: VASCULAR Aorta: Normal caliber aorta without aneurysm, dissection, vasculitis or significant stenosis. Celiac: Patent without evidence of aneurysm, dissection, vasculitis or significant stenosis. SMA: Patent without evidence of aneurysm, dissection, vasculitis or significant stenosis. Renals: Both renal arteries are patent without evidence of aneurysm, dissection, vasculitis, fibromuscular dysplasia or significant stenosis. IMA: Patent without evidence of aneurysm, dissection, vasculitis or significant stenosis. RIGHT Lower Extremity Inflow: Common, internal and external iliac arteries are patent without evidence of aneurysm, dissection, vasculitis or significant stenosis. Outflow: Common, superficial and profunda femoral arteries and the popliteal artery are patent without evidence of aneurysm,  dissection, vasculitis or significant stenosis. Runoff: Patent three vessel runoff to the ankle. LEFT Lower Extremity Inflow: Common, internal and external iliac arteries are patent without evidence of aneurysm, dissection, vasculitis or significant stenosis. Outflow: Common, superficial and profunda femoral arteries and the popliteal artery are patent without evidence of aneurysm, dissection, vasculitis or significant stenosis. Runoff: Patent three vessel runoff to the ankle. Veins: No obvious venous abnormality within the limitations of this arterial phase study. Review of the MIP images confirms the above findings. NON-VASCULAR Lower chest: No acute abnormality. Hepatobiliary: No focal liver abnormality is seen. No gallstones, gallbladder wall thickening, or biliary dilatation. Pancreas: Unremarkable Spleen: Unremarkable Adrenals/Urinary Tract: Adrenal glands are unremarkable. Kidneys are normal, without renal calculi, focal lesion, or hydronephrosis. Bladder is unremarkable. Stomach/Bowel: Stomach is within normal limits. Appendix appears normal. No evidence of bowel wall thickening, distention, or inflammatory changes. Lymphatic: No pathologic adenopathy within the abdomen and pelvis. Reproductive: Prostate is unremarkable. Other: No  abdominal wall hernia or abnormality. No abdominopelvic ascites. Musculoskeletal: No acute or significant osseous findings. IMPRESSION: 1. Normal examination. No evidence of dissection, aneurysm, vasculitis, or hemodynamically significant stenosis within the lower extremity arterial vasculature. Three-vessel runoff to the ankles bilaterally. 2. No acute intra-abdominal pathology identified. Electronically Signed   By: Fidela Salisbury M.D.   On: 10/07/2022 21:59   VAS [place] ABI WITH/WO TBI  Result Date: 10/07/2022  LOWER EXTREMITY DOPPLER STUDY Patient Name:  ATARI LEISENRING  Date of Exam:   10/07/2022 Medical Rec #: [id number]            Accession #:    [id number] Date of Birth:  Jul 03, 1981            Patient Gender: M Patient Age:   49 years Exam Location:  [facility] Procedure:      VAS [place] ABI WITH/WO TBI Referring Phys: Theodis Blaze --------------------------------------------------------------------------------  Indications: Ulceration, patient describes having blisters on left 3rd and 4th              toes after pinching from boots. Blisters burst leaving black              discoloration to the 3rd and 4th toes. Small area of discoloration              also noted to left great toe. Other Factors: HIV, Thoracic outlet syndrome.  Comparison Study: No prior study on file Performing Technologist: Sharion Dove RVS  Examination Guidelines: A complete evaluation includes at minimum, Doppler waveform signals and systolic blood pressure reading at the level of bilateral brachial, anterior tibial, and posterior tibial arteries, when vessel segments are accessible. Bilateral testing is considered an integral part of a complete examination. Photoelectric Plethysmograph (PPG) waveforms and toe systolic pressure readings are included as required and additional duplex testing as needed. Limited examinations for reoccurring indications may be performed as noted.  ABI Findings: +---------+------------------+-----+-----------+--------+ Right    Rt Pressure (mmHg)IndexWaveform   Comment  +---------+------------------+-----+-----------+--------+ Brachial 106                    triphasic           +---------+------------------+-----+-----------+--------+ PTA      140               1.20 multiphasic         +---------+------------------+-----+-----------+--------+ DP       142               1.21 multiphasic         +---------+------------------+-----+-----------+--------+ Great Toe120               1.03 Normal              +---------+------------------+-----+-----------+--------+ +---------+------------------+-----+-----------+-------+ Left     Lt Pressure  (mmHg)IndexWaveform   Comment +---------+------------------+-----+-----------+-------+ Brachial 117                    triphasic          +---------+------------------+-----+-----------+-------+ PTA      145               1.24 multiphasic        +---------+------------------+-----+-----------+-------+ DP       123               1.05 multiphasic        +---------+------------------+-----+-----------+-------+ Great Toe128               1.09 Normal             +---------+------------------+-----+-----------+-------+ +-------+-----------+-----------+------------+------------+  ABI/TBIToday's ABIToday's TBIPrevious ABIPrevious TBI +-------+-----------+-----------+------------+------------+ Right  1.2        1.03                                +-------+-----------+-----------+------------+------------+ Left   1.24       1.09                                +-------+-----------+-----------+------------+------------+   Summary: Right: Resting right ankle-brachial index is within normal range. The right toe-brachial index is normal. Left: Resting left ankle-brachial index is within normal range. The left toe-brachial index is normal. *See table(s) above for measurements and observations.     Preliminary    DG Foot Complete Left  Result Date: 10/07/2022 CLINICAL DATA:  Left foot pain for 2 weeks. Discoloration inflammation noted to the third and fourth toes. EXAM: LEFT FOOT - COMPLETE 3+ VIEW COMPARISON:  None Available. FINDINGS: Normal bone mineralization. Minimal posterior calcaneal heel spur. No acute fracture or dislocation. Mild lateral second DIP degenerative osteophytosis. No cortical erosion. No subcutaneous air. IMPRESSION: 1. Mild second DIP osteoarthritis. 2. Minimal posterior calcaneal heel spur. 3. No cortical erosion to indicate radiographic evidence of osteomyelitis. Electronically Signed   By: Yvonne Kendall M.D.   On: 10/07/2022 18:06    Procedures Procedures    Medications Ordered in ED Medications  iohexol (OMNIPAQUE) 350 MG/ML injection 100 mL (100 mLs Intravenous Contrast Given 10/07/22 2143)    ED Course/ Medical Decision Making/ A&P    Medical Decision Making Amount and/or Complexity of Data Reviewed Labs: ordered. Radiology: ordered.  Risk Prescription drug management.  Initial Impression and Ddx 42 year old male who presents to the emergency department with foot pain. Patient PMH that increases complexity of ED encounter: HIV, thoracic outlet syndrome, bipolar, substance abuse  Interpretation of Diagnostics I independent reviewed and interpreted the labs as followed: CBC without leukocytosis, CMP within normal limits, reactive agents are within normal limits, lactic is negative  - I independently visualized the following imaging with scope of interpretation limited to determining acute life threatening conditions related to emergency care: ABI is normal, plain film of the left foot without osteomyelitis, CT angio runoff is also without any acute findings  Patient Reassessment and Ultimate Disposition/Management Initial exam is concerning for possible gangrene to the left third and fourth digit.  He does have a pulse discrepancy to the left dorsalis pedis pulse.  He did have blisters to these feet and I guess it is possible that he has blood blisters here, however given the pulse discrepancy I have higher concern for an arterial clot.  Additionally he has a small area of what looks like pus on the plantar surface of the third digit.  I am obtaining labs including lactic, acute phase reactants, blood cultures, plain film to rule out osteomyelitis, ABIs and a CT runoff.  ABI with normal flow, plain film of the left foot with no evidence of osteomyelitis.  Still pending lab work and CT angio runoff.  2253: CT angio runoff is normal.  His lab work is reassuring including a negative lactic, CRP, ESR.  This may all just be in fact his tight  shoes that have caused blistering and blood blister to the end of the toes.  I am going to place him on Keflex as the toes do not appear to be infected with cellulitis.  No  evidence of osteo-.  I have given him strict return precautions if this darkening spreads as he does have a palpable pulse discrepancy in this left foot.  He verbalized understanding.  I otherwise feel that he is safe for discharge at this time. Considered consulting vascular surgery, however, given normal studies do not feel this is appropriate at this time.  The patient has been appropriately medically screened and/or stabilized in the ED. I have low suspicion for any other emergent medical condition which would require further screening, evaluation or treatment in the ED or require inpatient management. At time of discharge the patient is hemodynamically stable and in no acute distress. I have discussed work-up results and diagnosis with patient and answered all questions. Patient is agreeable with discharge plan. We discussed strict return precautions for returning to the emergency department and they verbalized understanding.    Patient management required discussion with the following services or consulting groups:  None  Complexity of Problems Addressed Acute complicated illness or Injury  Additional Data Reviewed and Analyzed Further history obtained from: Past medical history and medications listed in the EMR, Prior ED visit notes, Recent Consult notes, Care Everywhere, and Prior labs/imaging results  Patient Encounter Risk Assessment Prescriptions and SDOH impact on management  Final Clinical Impression(s) / ED Diagnoses Final diagnoses:  Cellulitis of toe of left foot    Rx / DC Orders ED Discharge Orders          Ordered    cephALEXin (KEFLEX) 500 MG capsule  4 times daily        10/07/22 2251              Mickie Hillier, PA-C 10/07/22 2255    Tegeler, Gwenyth Allegra, MD 10/07/22 [phone]

## 2022-10-07 NOTE — ED Triage Notes (Addendum)
Pt c/o left foot pain for approx 2 weeks due to his boots. Pt states the foot pain is better but toe pain has persisted in his toes. Discoloration and inflammation noted to 3rd and 4th toes. Sensation and mobility intact.

## 2022-10-07 NOTE — ED Notes (Signed)
To ct

## 2022-10-07 NOTE — ED Notes (Signed)
Blood not drawn until 1040  not time for a lactic acid

## 2022-10-07 NOTE — ED Notes (Signed)
Pt wanted a snack   dr tegeler  wants him to hold until after his c-t

## 2022-10-07 NOTE — Progress Notes (Signed)
VASCULAR LAB    ABI has been performed.  See CV proc for preliminary results.   Remiel Corti, RVT 10/07/2022, 7:20 PM

## 2022-10-07 NOTE — Discharge Instructions (Signed)
You were seen in the emergency department today for discoloration to your toes.  I am placing on antibiotics that you are to take over the next 7 days for infection.  You need to return to the emergency department if the dark discoloration at the end of the toe starts to spread further.  Please follow-up with your primary care providers as scheduled.

## 2022-10-08 LAB — VAS US ABI WITH/WO TBI
Left ABI: 1.24
Right ABI: 1.2

## 2022-10-09 ENCOUNTER — Ambulatory Visit (INDEPENDENT_AMBULATORY_CARE_PROVIDER_SITE_OTHER): Payer: Medicare (Managed Care)

## 2022-10-09 ENCOUNTER — Ambulatory Visit: Payer: Medicare (Managed Care) | Admitting: Student

## 2022-10-09 ENCOUNTER — Encounter: Payer: Self-pay | Admitting: Student

## 2022-10-09 VITALS — BP 122/74 | HR 97 | Temp 97.6°F | Ht 63.0 in | Wt 178.1 lb

## 2022-10-09 DIAGNOSIS — Z0000 Encounter for general adult medical examination without abnormal findings: Secondary | ICD-10-CM

## 2022-10-09 DIAGNOSIS — S90425A Blister (nonthermal), left lesser toe(s), initial encounter: Secondary | ICD-10-CM

## 2022-10-09 DIAGNOSIS — T148XXA Other injury of unspecified body region, initial encounter: Secondary | ICD-10-CM | POA: Diagnosis not present

## 2022-10-09 NOTE — Patient Instructions (Signed)
[name], it was a pleasure seeing you today!  Today we discussed: - Continue taking your antibiotics for the toe wounds. If your symptoms worsen or you start to lose sensation in them please come back to clinic.   Follow-up: 6 months   Please make sure to arrive 15 minutes prior to your next appointment. If you arrive late, you may be asked to reschedule.   We look forward to seeing you next time. Please call our clinic at [phone] if you have any questions or concerns. The best time to call is Monday-Friday from 9am-4pm, but there is someone available 24/7. If after hours or the weekend, call the main hospital number and ask for the Internal Medicine Resident On-Call. If you need medication refills, please notify your pharmacy one week in advance and they will send [place] a request.  Thank you for letting us take part in your care. Wishing you the best!  Thank you, Sanjuan Dame, MD

## 2022-10-09 NOTE — Assessment & Plan Note (Signed)
Patient is presenting today to [facility] after recent visit to ED for toe injuries. Reports he was wearing tight-fitting boots for a few days then subsequently had blisters on his toes. Soon these blisters turned dark, therefore he went to the Emergency Department for further evaluation. During that evaluation, XR, CTA, and ABI's were obtained - all of which returned normal. Lactic and inflammatory markers were also negative. Patient was discharged on 7d of Keflex. Since that time, Kenneth Hunt has been tolerating the medication well, states the erythema around the toes has significantly improved. He still has some pain in the area but overall feels like it is improving. On exam today, he does have full function and sensation in his feet. The discoloration appears subdermal, most likely representing blood blisters from tight-fitting shoes. No fluctuance, purulence, or overt signs of infection. I have instructed him to continue with Keflex for possible cellulitis and return precautions in case his symptoms worsen.  - Continue cephalexin '500mg'$  QID x7d (last day 10/14/22) - Return precautions given

## 2022-10-09 NOTE — Progress Notes (Signed)
Subjective:   Kenneth Hunt is a 42 y.o. male who presents for an Initial Medicare Annual Wellness Visit. I connected with  Kenneth Hunt on 10/09/22 by a  Face-To-Face  enabled telemedicine application and verified that I am speaking with the correct person using two identifiers.  Patient Location: Other:  Office/Clinic  Provider Location: Office/Clinic  I discussed the limitations of evaluation and management by telemedicine. The patient expressed understanding and agreed to proceed.  Review of Systems    Defer to PCP       Objective:    Today's Vitals   10/09/22 1004 10/09/22 1005  BP: 122/74   Pulse: 97   Temp: 97.6 F (36.4 C)   TempSrc: Oral   SpO2: 96%   Weight: 178 lb 1.6 oz (80.8 kg)   Height: '5\' 3"'$  (1.6 m)   PainSc:  5    Body mass index is 31.55 kg/m.     10/09/2022   10:05 AM 10/09/2022    8:45 AM 10/07/2022    4:50 PM 06/28/2022    2:46 PM 07/20/2021    2:43 PM 01/12/2021    3:21 PM 11/13/2020    3:51 AM  Advanced Directives  Does Patient Have a Medical Advance Directive? No No No No No No No  Would patient like information on creating a medical advance directive? No - Patient declined No - Patient declined No - Patient declined No - Patient declined No - Patient declined No - Patient declined No - Patient declined    Current Medications (verified) Outpatient Encounter Medications as of 10/09/2022  Medication Sig   ARISTADA 1064 MG/3.9ML prefilled syringe Inject 1,064 mg into the muscle once.   bictegravir-emtricitabine-tenofovir AF (BIKTARVY) 50-200-25 MG TABS tablet Take 1 tablet by mouth daily.   cephALEXin (KEFLEX) 500 MG capsule Take 1 capsule (500 mg total) by mouth 4 (four) times daily for 7 days.   DULoxetine (CYMBALTA) 30 MG capsule TAKE 3 CAPSULES(90 MG) BY MOUTH DAILY   gabapentin (NEURONTIN) 400 MG capsule TAKE 1 CAPSULE(400 MG) BY MOUTH THREE TIMES DAILY   No facility-administered encounter medications on file as of 10/09/2022.     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 MRI brain and cervical spine showed bilateral neural foraminal cysts C4-C5, C5-C6, C6-C7, brain within normal limits.  - continue cymbalta, flexeril, neurontin - f/u with Dr. Elana Alm [facility] neurology 03/28/19   Depression    History of syphilis 12/30/2017   RPR 1:1 12/25/17 s/p adequate treatment   HIV (human immunodeficiency virus infection) ([place])    TOS (thoracic outlet syndrome)    Past Surgical History:  Procedure Laterality Date   tos surgery     Family History  Problem Relation Age of Onset   COPD Mother    Social History   Socioeconomic History   Marital status: Single    Spouse name: Not on file   Number of children: Not on file   Years of education: Not on file   Highest education level: Not on file  Occupational History   Not on file  Tobacco Use   Smoking status: Never   Smokeless tobacco: Never  Vaping Use   Vaping Use: Never used  Substance and Sexual Activity   Alcohol use: Not Currently   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, Methamphetamines    Comment: last used  last nght   Sexual activity: Yes    Partners: Male    Comment: declined condoms  Other Topics Concern   Not on file  Social History Narrative   Not on file   Social Determinants of Health   Financial Resource Strain: Low Risk  (10/09/2022)   Overall Financial Resource Strain (CARDIA)    Difficulty of Paying Living Expenses: Not very hard  Food Insecurity: No Food Insecurity (10/09/2022)   Hunger Vital Sign    Worried About Running Out of Food in the Last Year: Never true    Ran Out of Food in the Last Year: Never true  Transportation Needs: No Transportation Needs (10/09/2022)   PRAPARE - Hydrologist (Medical): No    Lack of Transportation (Non-Medical): No  Physical  Activity: Inactive (10/09/2022)   Exercise Vital Sign    Days of Exercise per Week: 0 days    Minutes of Exercise per Session: 0 min  Stress: Stress Concern Present (10/09/2022)   [place]    Feeling of Stress : Rather much  Social Connections: Moderately Integrated (10/09/2022)   Social Connection and Isolation Panel [NHANES]    Frequency of Communication with Friends and Family: More than three times a week    Frequency of Social Gatherings with Friends and Family: Three times a week    Attends Religious Services: More than 4 times per year    Active Member of Clubs or Organizations: No    Attends Music therapist: More than 4 times per year    Marital Status: Never married    Tobacco Counseling Counseling given: Not Answered   Clinical Intake:  Pre-visit preparation completed: Yes  Pain : 0-10 Pain Score: 5  Pain Type: Acute pain Pain Location: Foot Pain Orientation: Left Pain Descriptors / Indicators: Pressure Pain Onset: 1 to 4 weeks ago Pain Frequency: Constant     Nutritional Risks: None Diabetes: No  How often do you need to have someone help you when you read instructions, pamphlets, or other written materials from your doctor or pharmacy?: 1 - Never What is the last grade level you completed in school?: associates  Diabetic?No  Interpreter Needed?: No  Information entered by :: Hester Forget,cma   Activities of Daily Living    10/09/2022   10:06 AM 10/09/2022    8:44 AM  In your present state of health, do you have any difficulty performing the following activities:  Hearing? 0 0  Vision? 0 0  Difficulty concentrating or making decisions? 0 0  Walking or climbing stairs? 0 0  Dressing or bathing? 0 0  Doing errands, shopping? 0 0    Patient Care Team: Sanjuan Dame, MD as PCP - General (Internal Medicine)  Indicate any recent Medical Services you may have received  from other than [facility] providers in the past year (date may be approximate).     Assessment:   This is a routine wellness examination for Kenneth Hunt.  Hearing/Vision screen No results found.  Dietary issues and exercise activities discussed:     Goals Addressed   None   Depression Screen    10/09/2022   10:06 AM 10/09/2022    8:44 AM 08/20/2022    1:51 PM 06/28/2022    2:44 PM 02/07/2022    3:06 PM 08/02/2021    2:58 PM 07/20/2021    2:45 PM  PHQ 2/9 Scores  PHQ - 2 Score 0 0  1 0 0 0 1  PHQ- 9 Score       4    Fall Risk    10/09/2022   10:05 AM 10/09/2022    8:44 AM 08/20/2022    1:51 PM 06/28/2022    2:45 PM 02/07/2022    3:06 PM  Fall Risk   Falls in the past year? 0 0 0 0 0  Number falls in past yr: 0 0  0 0  Injury with Fall? 0 0  0 0  Risk for fall due to : No Fall Risks No Fall Risks No Fall Risks No Fall Risks No Fall Risks  Follow up Falls evaluation completed;Falls prevention discussed Falls evaluation completed;Falls prevention discussed Falls evaluation completed Falls evaluation completed;Falls prevention discussed Falls evaluation completed    FALL RISK PREVENTION PERTAINING TO THE HOME:  Any stairs in or around the home? No  If so, are there any without handrails? No  Home free of loose throw rugs in walkways, pet beds, electrical cords, etc? Yes  Adequate lighting in your home to reduce risk of falls? Yes   ASSISTIVE DEVICES UTILIZED TO PREVENT FALLS:  Life alert? No  Use of a cane, walker or w/c? No  Grab bars in the bathroom? No  Shower chair or bench in shower? No  Elevated toilet seat or a handicapped toilet? No   TIMED UP AND GO:  Was the test performed? Yes .  Length of time to ambulate 10 feet: 1 min.   Gait slow and steady without use of assistive device  Cognitive Function:        10/09/2022   10:06 AM  6CIT Screen  What Year? 0 points  What month? 0 points  What time? 0 points  Count back from 20 0 points  Months in reverse 0  points  Repeat phrase 0 points  Total Score 0 points    Immunizations Immunization History  Administered Date(s) Administered   Hepb-cpg 08/24/2019, 02/07/2022   Influenza,inj,Quad PF,6+ Mos 06/27/2020, 07/20/2021   Influenza-Unspecified 05/14/2019, 05/13/2022   Janssen (J&J) SARS-COV-2 Vaccination 11/03/2019   Meningococcal polysaccharide vaccine (MPSV4) 09/02/2018   PFIZER Comirnaty(Gray Top)Covid-19 Tri-Sucrose Vaccine 02/06/2021   PNEUMOCOCCAL CONJUGATE-20 02/07/2022   Pfizer Covid-19 Vaccine Bivalent Booster 44yr & up 08/02/2021   Pfizer Fall 2023 Covid-19 Vaccine 588yrthru 1141yr10/08/2021   Tdap 10/17/2020   Vaccinia,smallpox Monkeypox Vaccine Live,pf 04/21/2021, 05/25/2021    TDAP status: Up to date  Flu Vaccine status: Up to date  Pneumococcal vaccine status: Due, Education has been provided regarding the importance of this vaccine. Advised may receive this vaccine at local pharmacy or Health Dept. Aware to provide a copy of the vaccination record if obtained from local pharmacy or Health Dept. Verbalized acceptance and understanding.  Covid-19 vaccine status: Completed vaccines  Qualifies for Shingles Vaccine? No   Zostavax completed No   Shingrix Completed?: No.    Education has been provided regarding the importance of this vaccine. Patient has been advised to call insurance company to determine out of pocket expense if they have not yet received this vaccine. Advised may also receive vaccine at local pharmacy or Health Dept. Verbalized acceptance and understanding.  Screening Tests Health Maintenance  Topic Date Due   COVID-19 Vaccine (4 - 2023-24 season) 05/13/2022   Medicare Annual Wellness (AWV)  10/10/2023   DTaP/Tdap/Td (2 - Td or Tdap) 10/18/2030   INFLUENZA VACCINE  Completed   Hepatitis C Screening  Completed   HIV Screening  Completed   HPV VACCINES  Aged Out    Health Maintenance  Health Maintenance Due  Topic Date Due   COVID-19 Vaccine (4 -  2023-24 season) 05/13/2022      Lung Cancer Screening: (Low Dose CT Chest recommended if Age 22-80 years, 30 pack-year currently smoking OR have quit w/in 15years.) does not qualify.   Lung Cancer Screening Referral: N/A  Additional Screening:  Hepatitis C Screening: does not qualify; Completed 07/23/2019  Vision Screening: Recommended annual ophthalmology exams for early detection of glaucoma and other disorders of the eye. Is the patient up to date with their annual eye exam?  No  Who is the provider or what is the name of the office in which the patient attends annual eye exams? N/A If pt is not established with a provider, would they like to be referred to a provider to establish care? No .   Dental Screening: Recommended annual dental exams for proper oral hygiene  Community Resource Referral / Chronic Care Management: CRR required this visit?  No   CCM required this visit?  No      Plan:     I have personally reviewed and noted the following in the patient's chart:   Medical and social history Use of alcohol, tobacco or illicit drugs  Current medications and supplements including opioid prescriptions. Patient is currently taking opioid prescriptions. Information provided to patient regarding non-opioid alternatives. Patient advised to discuss non-opioid treatment plan with their provider. Functional ability and status Nutritional status Physical activity Advanced directives List of other physicians Hospitalizations, surgeries, and ER visits in previous 12 months Vitals Screenings to include cognitive, depression, and falls Referrals and appointments  In addition, I have reviewed and discussed with patient certain preventive protocols, quality metrics, and best practice recommendations. A written personalized care plan for preventive services as well as general preventive health recommendations were provided to patient.     Kerin Perna, [facility]   10/09/2022   Nurse  Notes: Face-To-Face  Kenneth Hunt , Thank you for taking time to come for your Medicare Wellness Visit. I appreciate your ongoing commitment to your health goals. Please review the following plan we discussed and let me know if I can assist you in the future.   These are the goals we discussed:  Goals   None     This is a list of the screening recommended for you and due dates:  Health Maintenance  Topic Date Due   COVID-19 Vaccine (4 - 2023-24 season) 05/13/2022   Medicare Annual Wellness Visit  10/10/2023   DTaP/Tdap/Td vaccine (2 - Td or Tdap) 10/18/2030   Flu Shot  Completed   Hepatitis C Screening: USPSTF Recommendation to screen - Ages 18-79 yo.  Completed   HIV Screening  Completed   HPV Vaccine  Aged Out

## 2022-10-09 NOTE — Progress Notes (Signed)
   CC: toe pain  HPI:  Kenneth Hunt is a 42 y.o. person with medical history as below presenting to [facility] for toe pain.  Please see problem-based list for further details, assessments, and plans.  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 MRI brain and cervical spine showed bilateral neural foraminal cysts C4-C5, C5-C6, C6-C7, brain within normal limits.  - continue cymbalta, flexeril, neurontin - f/u with Dr. Elana Alm [facility] neurology 03/28/19   Depression    History of syphilis 12/30/2017   RPR 1:1 12/25/17 s/p adequate treatment   HIV (human immunodeficiency virus infection) ([place])    TOS (thoracic outlet syndrome)    Review of Systems:  As per HPI  Physical Exam:  Vitals:   10/09/22 0843  BP: 122/74  Pulse: 97  Temp: 97.6 F (36.4 C)  TempSrc: Oral  SpO2: 96%  Weight: 178 lb 1.6 oz (80.8 kg)  Height: 5' 3"$  (1.6 m)   General: Resting comfortably in no acute distress CV: Dorsalis pedis pulse 2+ on R, 1+ on L. Cap refill <2s bilateral toes. Pulm: Normal work of breathing on room air. Skin: Warm, dry throughout. Tips of third and fourth toes on L with discoloration, mildly tender to palpation, no open wounds appreciated. No purulence or erythema. Mild discoloration on medial portion of great 1st toe on L.  Neuro: Awake, alert, sensation in tact in tact in toes. Psych: Normal mood, affect, speech.     Assessment & Plan:   Blood blister Patient is presenting today to [facility] after recent visit to ED for toe injuries. Reports he was wearing tight-fitting boots for a few days then subsequently had blisters on his toes. Soon these blisters turned dark, therefore he went to the Emergency Department for further evaluation. During that evaluation, XR, CTA, and ABI's were obtained - all of which returned normal. Lactic and inflammatory markers were also negative. Patient was discharged on  7d of Keflex. Since that time, Mr. Lipper has been tolerating the medication well, states the erythema around the toes has significantly improved. He still has some pain in the area but overall feels like it is improving. On exam today, he does have full function and sensation in his feet. The discoloration appears subdermal, most likely representing blood blisters from tight-fitting shoes. No fluctuance, purulence, or overt signs of infection. I have instructed him to continue with Keflex for possible cellulitis and return precautions in case his symptoms worsen.  - Continue cephalexin 556m QID x7d (last day 10/14/22) - Return precautions given  Patient discussed with Dr. HDenita Lung MD Internal Medicine PGY-3 Pager: [phone]

## 2022-10-10 DIAGNOSIS — F3132 Bipolar disorder, current episode depressed, moderate: Secondary | ICD-10-CM | POA: Diagnosis not present

## 2022-10-10 DIAGNOSIS — F1510 Other stimulant abuse, uncomplicated: Secondary | ICD-10-CM | POA: Diagnosis not present

## 2022-10-12 LAB — CULTURE, BLOOD (ROUTINE X 2)
Culture: NO GROWTH
Culture: NO GROWTH
Special Requests: ADEQUATE

## 2022-10-12 NOTE — Progress Notes (Signed)
Internal Medicine Clinic Attending  Case discussed with the resident.  I reviewed the AWV findings.  I agree with the assessment, diagnosis, and plan of care documented in the AWV note.

## 2022-10-12 NOTE — Progress Notes (Signed)
Internal Medicine Clinic Attending  Case discussed with the resident at the time of the visit.  We reviewed the resident's history and exam and pertinent patient test results.  I agree with the assessment, diagnosis, and plan of care documented in the resident's note.  

## 2022-10-15 ENCOUNTER — Telehealth: Payer: Self-pay

## 2022-10-15 NOTE — Telephone Encounter (Signed)
Pt called and informed of Dr Lorelee Cover response. Pt stated he will wait another couple of days and  if his toes are not better, he will call back to schedule an appt.

## 2022-10-15 NOTE — Telephone Encounter (Signed)
Would hold off antibiotics for now. If the redness is worsening or he starts having other symptoms I would have him come in the office.

## 2022-10-15 NOTE — Telephone Encounter (Signed)
Pt's states his toe is red, requesting antibiotic. Please call pt back.

## 2022-10-15 NOTE — Telephone Encounter (Signed)
Return pt's call - stated he saw Dr Collene Gobble last week who took a picture of his toes. Stated he has finished the abx which was prescribed by ED doctor. Stated his toes are still red and sore; wants to know if Dr Collene Gobble could order another course of abx?

## 2022-10-16 ENCOUNTER — Telehealth: Payer: Self-pay

## 2022-10-16 NOTE — Telephone Encounter (Signed)
Patient called stating that he tested positive for rectal gonorrhea at the health dept. He said he was told by the health dept to contact our office for treatment. Patient states that he just finished cephalexin for blisters on his feet (wanted to inform you).   If okay to get him in for treatment will call patient back and get him scheduled.  [place], CMA

## 2022-10-16 NOTE — Telephone Encounter (Signed)
Patient aware. Patient scheduled for treatment tomorrow 10/17/2022. Patient will contact health dept to see if he had a syphilis test done and will call me back.   [place], CMA

## 2022-10-17 ENCOUNTER — Other Ambulatory Visit: Payer: Self-pay

## 2022-10-17 ENCOUNTER — Ambulatory Visit (INDEPENDENT_AMBULATORY_CARE_PROVIDER_SITE_OTHER): Payer: Medicare (Managed Care)

## 2022-10-17 DIAGNOSIS — A549 Gonococcal infection, unspecified: Secondary | ICD-10-CM | POA: Diagnosis not present

## 2022-10-17 MED ORDER — CEFTRIAXONE SODIUM 500 MG IJ SOLR
500.0000 mg | Freq: Once | INTRAMUSCULAR | Status: AC
  Administered 2022-10-17: 500 mg via INTRAMUSCULAR

## 2022-10-17 NOTE — Telephone Encounter (Signed)
Patient had RPR done at [facility] - waiting on results.

## 2022-10-19 DIAGNOSIS — B20 Human immunodeficiency virus [HIV] disease: Secondary | ICD-10-CM | POA: Diagnosis not present

## 2022-10-19 DIAGNOSIS — R85612 Low grade squamous intraepithelial lesion on cytologic smear of anus (LGSIL): Secondary | ICD-10-CM | POA: Diagnosis not present

## 2022-10-19 DIAGNOSIS — D013 Carcinoma in situ of anus and anal canal: Secondary | ICD-10-CM | POA: Diagnosis not present

## 2022-10-24 ENCOUNTER — Telehealth: Payer: Self-pay

## 2022-10-24 NOTE — Telephone Encounter (Signed)
Pt  is requesting a call back ...  He is wanting to set up an appt with with bianca .ibuprofen told him that when he comes in on the 19 to  also ask Dr Coy Saunas  so he can also put an referral in

## 2022-10-25 NOTE — Telephone Encounter (Signed)
I dont know how ibuprofen  got in that message

## 2022-10-30 ENCOUNTER — Encounter: Payer: Medicare (Managed Care) | Admitting: Student

## 2022-11-06 ENCOUNTER — Telehealth: Payer: Self-pay

## 2022-11-06 DIAGNOSIS — F3181 Bipolar II disorder: Secondary | ICD-10-CM

## 2022-11-06 NOTE — Telephone Encounter (Signed)
Patient called he is requesting a referral to see Wyatt Portela for behavioral health.

## 2022-11-07 ENCOUNTER — Ambulatory Visit: Payer: Medicare (Managed Care)

## 2022-11-07 NOTE — Addendum Note (Signed)
Addended bySanjuan Dame on: 11/07/2022 11:01 AM   Modules accepted: Orders

## 2022-11-30 ENCOUNTER — Encounter (HOSPITAL_COMMUNITY): Payer: Self-pay

## 2022-11-30 ENCOUNTER — Ambulatory Visit (HOSPITAL_COMMUNITY)
Admission: EM | Admit: 2022-11-30 | Discharge: 2022-11-30 | Disposition: A | Discharge status: Home / Self Care | Payer: Medicare (Managed Care) | Attending: Emergency Medicine | Admitting: Emergency Medicine

## 2022-11-30 DIAGNOSIS — L03114 Cellulitis of left upper limb: Secondary | ICD-10-CM

## 2022-11-30 HISTORY — DX: Other psychoactive substance abuse, uncomplicated: F19.10

## 2022-11-30 MED ORDER — DOXYCYCLINE HYCLATE 100 MG PO CAPS: 100.0000 mg | ORAL_CAPSULE | Freq: Two times a day (BID) | ORAL | 0 refills | Status: AC

## 2022-11-30 NOTE — Discharge Instructions (Addendum)
Please take medication as prescribed. Take with food to avoid upset stomach.  Monitor the area of redness. If at any point it begins to spread, pain worsens, or you have fever/chills, or chest pain, please go immediately to the emergency department right away.   If there is no change after 24-48 hours, please go to the emergency department

## 2022-11-30 NOTE — ED Triage Notes (Signed)
Pt states that in elbow of left arm. Started 2 days ago. Hx of IV drug usage. Last usage 2 weeks.

## 2022-11-30 NOTE — ED Provider Notes (Signed)
[facility]    CSN: [id number] Arrival date & time: 11/30/22  1817      History   Chief Complaint Chief Complaint  Patient presents with   Rash    HPI Kenneth Hunt is a 42 y.o. male.  2-day history of rash on the left inner arm A little red. Felt firm beneath skin. Reports history of IV drug use. Last injection was left AC 2 weeks ago. Denies fever, chills, weakness, numbness/tingling, dizziness, chest pain, or shortness of breath.  No cardiac history. No valve replacements.   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 MRI brain and cervical spine showed bilateral neural foraminal cysts C4-C5, C5-C6, C6-C7, brain within normal limits.  - continue cymbalta, flexeril, neurontin - f/u with Dr. 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 Authorizing Provider  ARISTADA [phone] MG/3.9ML prefilled syringe Inject 1,064 mg into the muscle once. 03/18/19  Yes [provider]  bictegravir-emtricitabine-tenofovir AF (BIKTARVY) 50-200-25 MG TABS tablet Take 1 tablet by mouth daily. 02/07/22  Yes Blanchard Kelch, NP  doxycycline (VIBRAMYCIN) 100 MG capsule Take 1 capsule (100 mg total) by mouth 2 (two) times daily for 5 days. 11/30/22 12/05/22 Yes Meilani Edmundson, Lurena Joiner, PA-C  DULoxetine (CYMBALTA) 30 MG capsule TAKE 3 CAPSULES(90 MG) BY MOUTH DAILY 01/17/22  Yes Evlyn Kanner, MD  gabapentin (NEURONTIN) 400 MG capsule TAKE 1 CAPSULE(400 MG) BY MOUTH THREE TIMES DAILY 07/24/22  Yes Evlyn Kanner, MD    Family History Family History  Problem Relation Age of Onset   COPD Mother     Social History Social History   Tobacco Use   Smoking status: Never   Smokeless tobacco: Never  Vaping Use   Vaping Use: Never used  Substance Use Topics   Alcohol use: Not Currently   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, Methamphetamines    Comment: last used last nght     Allergies   Haloperidol   Review of Systems Review of Systems  Skin:  Positive for rash.   As per HPI  Physical Exam Triage Vital Signs ED Triage Vitals  Enc Vitals Group     BP 11/30/22 1900 101/71     Pulse Rate 11/30/22 1858 92  Resp 11/30/22 1858 16     Temp 11/30/22 1858 98.4 F (36.9 C)     Temp Source 11/30/22 1858 Oral     SpO2 11/30/22 1858 97 %     Weight 11/30/22 1857 175 lb (79.4 kg)     Height --      Head Circumference --      Peak Flow --      Pain Score 11/30/22 1857 0     Pain Loc --      Pain Edu? --      Excl. in GC? --    No data found.  Updated Vital Signs BP 101/71 (BP Location: Left Arm)   Pulse 92   Temp 98.4 F (36.9 C) (Oral)   Resp 16   Wt 175 lb (79.4 kg)   SpO2 97%   BMI 31.00 kg/m   Physical Exam Vitals and  nursing note reviewed.  Constitutional:      General: He is not in acute distress.    Appearance: He is not ill-appearing or diaphoretic.  HENT:     Mouth/Throat:     Mouth: Mucous membranes are moist.     Pharynx: Oropharynx is clear. No posterior oropharyngeal erythema.  Cardiovascular:     Rate and Rhythm: Normal rate and regular rhythm.     Heart sounds: Normal heart sounds. No murmur heard. Pulmonary:     Effort: Pulmonary effort is normal.     Breath sounds: Normal breath sounds.  Musculoskeletal:        General: Normal range of motion.     Cervical back: Normal range of motion.  Skin:    General: Skin is warm and dry.     Findings: Erythema present.          Comments: About 2 inch diameter of erythema over the left AC. 2 small healed puncture wounds. There is no streaking. Area has a small spot of induration.   Neurological:     General: No focal deficit present.     Mental Status: He is alert and oriented to person, place, and time.      UC Treatments / Results  Labs (all labs ordered are listed, but only abnormal results are displayed) Labs Reviewed - No data to display  EKG   Radiology No results found.  Procedures Procedures (including critical care time)  Medications Ordered in UC Medications - No data to display  Initial Impression / Assessment and Plan / UC Course  I have reviewed the triage vital signs and the nursing notes.  Pertinent labs & imaging results that were available during my care of the patient were reviewed by me and considered in my medical decision making (see chart for details).  At this time, no red flags.  No signs of sepsis, low concern for joint involvement, no sign of necrotizing or gangrenous infection. Low concern for DVT. No circumferential swelling - limited area of involvement.   Cellulitis. Discussion with patient that IV drug use puts his risk of infection a lot higher. Given the small area of skin changes without  streaking, no systemic symptoms, and stable vitals in clinic, we will attempt outpatient treatment with doxy BID x 5 days. Very strict ED precautions were verbalized twice and patient voiced understanding. Understands can turn into life/limb threatening infection.   Final Clinical Impressions(s) / UC Diagnoses   Final diagnoses:  Cellulitis of left upper extremity     Discharge Instructions      Please take  medication as prescribed. Take with food to avoid upset stomach.  Monitor the area of redness. If at any point it begins to spread, pain worsens, or you have fever/chills, or chest pain, please go immediately to the emergency department right away.   If there is no change after 24-48 hours, please go to the emergency department     ED Prescriptions     Medication Sig Dispense Auth. Provider   doxycycline (VIBRAMYCIN) 100 MG capsule Take 1 capsule (100 mg total) by mouth 2 (two) times daily for 5 days. 10 capsule Trell Secrist, Lurena Joiner, PA-C      PDMP not reviewed this encounter.

## 2022-12-08 ENCOUNTER — Encounter (HOSPITAL_COMMUNITY): Payer: Self-pay

## 2022-12-08 ENCOUNTER — Other Ambulatory Visit: Payer: Self-pay

## 2022-12-08 ENCOUNTER — Emergency Department (HOSPITAL_COMMUNITY)
Admission: EM | Admit: 2022-12-08 | Discharge: 2022-12-08 | Disposition: A | Discharge status: Home / Self Care | Payer: Medicare (Managed Care) | Attending: Emergency Medicine | Admitting: Emergency Medicine

## 2022-12-08 ENCOUNTER — Emergency Department (HOSPITAL_COMMUNITY): Payer: Medicare (Managed Care)

## 2022-12-08 DIAGNOSIS — K6289 Other specified diseases of anus and rectum: Secondary | ICD-10-CM | POA: Diagnosis present

## 2022-12-08 DIAGNOSIS — K604 Rectal fistula: Secondary | ICD-10-CM | POA: Diagnosis not present

## 2022-12-08 DIAGNOSIS — Z21 Asymptomatic human immunodeficiency virus [HIV] infection status: Secondary | ICD-10-CM | POA: Diagnosis not present

## 2022-12-08 DIAGNOSIS — R600 Localized edema: Secondary | ICD-10-CM | POA: Diagnosis not present

## 2022-12-08 LAB — CBC WITH DIFFERENTIAL/PLATELET
Abs Immature Granulocytes: 0.05 10*3/uL (ref 0.00–0.07)
Basophils Absolute: 0.1 10*3/uL (ref 0.0–0.1)
Basophils Relative: 1 %
Eosinophils Absolute: 0.1 10*3/uL (ref 0.0–0.5)
Eosinophils Relative: 1 %
HCT: 41.9 % (ref 39.0–52.0)
Hemoglobin: 14.4 g/dL (ref 13.0–17.0)
Immature Granulocytes: 1 %
Lymphocytes Relative: 27 %
Lymphs Abs: 2.9 10*3/uL (ref 0.7–4.0)
MCH: 30.3 pg (ref 26.0–34.0)
MCHC: 34.4 g/dL (ref 30.0–36.0)
MCV: 88.2 fL (ref 80.0–100.0)
Monocytes Absolute: 0.7 10*3/uL (ref 0.1–1.0)
Monocytes Relative: 6 %
Neutro Abs: 6.9 10*3/uL (ref 1.7–7.7)
Neutrophils Relative %: 64 %
Platelets: 294 10*3/uL (ref 150–400)
RBC: 4.75 MIL/uL (ref 4.22–5.81)
RDW: 12.9 % (ref 11.5–15.5)
WBC: 10.6 10*3/uL — ABNORMAL HIGH (ref 4.0–10.5)
nRBC: 0 % (ref 0.0–0.2)

## 2022-12-08 LAB — BASIC METABOLIC PANEL
Anion gap: 9 (ref 5–15)
BUN: 11 mg/dL (ref 6–20)
CO2: 27 mmol/L (ref 22–32)
Calcium: 9.3 mg/dL (ref 8.9–10.3)
Chloride: 102 mmol/L (ref 98–111)
Creatinine, Ser: 0.92 mg/dL (ref 0.61–1.24)
GFR, Estimated: >60 mL/min (ref >60)
Glucose, Bld: 109 mg/dL — ABNORMAL HIGH (ref 70–99)
Potassium: 3.9 mmol/L (ref 3.5–5.1)
Sodium: 138 mmol/L (ref 135–145)

## 2022-12-08 MED ORDER — AMOXICILLIN-POT CLAVULANATE 875-125 MG PO TABS
1.0000 | ORAL_TABLET | Freq: Once | ORAL | Status: AC
  Administered 2022-12-08: 1 via ORAL
  Filled 2022-12-08: qty 1

## 2022-12-08 MED ORDER — MORPHINE SULFATE (PF) 4 MG/ML IV SOLN
4.0000 mg | Freq: Once | INTRAVENOUS | Status: AC
  Administered 2022-12-08: 4 mg via INTRAVENOUS
  Filled 2022-12-08: qty 1

## 2022-12-08 MED ORDER — IOHEXOL 350 MG/ML SOLN
75.0000 mL | Freq: Once | INTRAVENOUS | Status: AC | PRN
  Administered 2022-12-08: 75 mL via INTRAVENOUS

## 2022-12-08 MED ORDER — LIDOCAINE-EPINEPHRINE-TETRACAINE (LET) TOPICAL GEL
3.0000 mL | Freq: Once | TOPICAL | Status: AC
  Administered 2022-12-08: 3 mL via TOPICAL
  Filled 2022-12-08: qty 3

## 2022-12-08 MED ORDER — ONDANSETRON HCL 4 MG/2ML IJ SOLN
4.0000 mg | Freq: Once | INTRAMUSCULAR | Status: AC
  Administered 2022-12-08: 4 mg via INTRAVENOUS
  Filled 2022-12-08: qty 2

## 2022-12-08 MED ORDER — AMOXICILLIN-POT CLAVULANATE 875-125 MG PO TABS: 1.0000 | ORAL_TABLET | Freq: Two times a day (BID) | ORAL | 0 refills | Status: DC

## 2022-12-08 NOTE — ED Provider Notes (Signed)
[place] EMERGENCY DEPARTMENT AT [facility] Provider Note   CSN: [id number] Arrival date & time: 12/08/22  1713    History  Chief Complaint  Patient presents with   Rectal Pain    Kenneth Hunt is a 42 y.o. male hx of HIV, prior syphilis, gonorrhea followed by [facility], on Biktarvy here for evaluation of rectal pain.  Approximately 4 days ago after rough anal intercourse.  Area is hard to touch and has a "pimple" at the end of it.  No fever.  No pain with bowel movements.  No scrotal pain.  No dysuria.  Compliant with home meds. No bloody stool.  HPI     Home Medications Prior to Admission medications   Medication Sig Start Date End Date Taking? Authorizing Provider  amoxicillin-clavulanate (AUGMENTIN) 875-125 MG tablet Take 1 tablet by mouth every 12 (twelve) hours. 12/08/22  Yes Gerrica Cygan A, PA-C  ARISTADA 1064 MG/3.9ML prefilled syringe Inject 1,064 mg into the muscle once. 03/18/19   [provider]  bictegravir-emtricitabine-tenofovir AF (BIKTARVY) 50-200-25 MG TABS tablet Take 1 tablet by mouth daily. 02/07/22   Blanchard Kelch, NP  DULoxetine (CYMBALTA) 30 MG capsule TAKE 3 CAPSULES(90 MG) BY MOUTH DAILY 01/17/22   Evlyn Kanner, MD  gabapentin (NEURONTIN) 400 MG capsule TAKE 1 CAPSULE(400 MG) BY MOUTH THREE TIMES DAILY 07/24/22   Evlyn Kanner, MD      Allergies    Haloperidol    Review of Systems   Review of Systems  Constitutional: Negative.   HENT: Negative.    Respiratory: Negative.    Cardiovascular: Negative.   Gastrointestinal:  Positive for rectal pain.  Genitourinary: Negative.   Musculoskeletal: Negative.   Skin: Negative.   Neurological: Negative.   All other systems reviewed and are negative.   Physical Exam Updated Vital Signs BP (!) 103/44 (BP Location: Right Arm)   Pulse 88   Temp 98.4 F (36.9 C) (Oral)   Resp 16   SpO2 96%  Physical Exam Vitals and nursing note reviewed. Exam conducted with a  chaperone present.  Constitutional:      General: He is not in acute distress.    Appearance: He is well-developed. He is not ill-appearing, toxic-appearing or diaphoretic.  HENT:     Head: Normocephalic and atraumatic.  Eyes:     Pupils: Pupils are equal, round, and reactive to light.  Cardiovascular:     Rate and Rhythm: Normal rate and regular rhythm.  Pulmonary:     Effort: Pulmonary effort is normal. No respiratory distress.  Abdominal:     General: There is no distension.     Palpations: Abdomen is soft.  Genitourinary:    Rectum: Tenderness present.       Comments: Male paramedic chaperone in room.  Patient with induration at the 9 oclock position,scant draining, mild surrounding erythema. Musculoskeletal:        General: Normal range of motion.     Cervical back: Normal range of motion and neck supple.  Skin:    General: Skin is warm and dry.  Neurological:     General: No focal deficit present.     Mental Status: He is alert and oriented to person, place, and time.     ED Results / Procedures / Treatments   Labs (all labs ordered are listed, but only abnormal results are displayed) Labs Reviewed  CBC WITH DIFFERENTIAL/PLATELET - Abnormal; Notable for the following components:      Result Value   WBC  10.6 (*)    All other components within normal limits  BASIC METABOLIC PANEL - Abnormal; Notable for the following components:   Glucose, Bld 109 (*)    All other components within normal limits    EKG None  Radiology CT PELVIS W CONTRAST  Result Date: 12/08/2022 CLINICAL DATA:  Perianal abscess or fistula suspected. EXAM: CT PELVIS WITH CONTRAST TECHNIQUE: Multidetector CT imaging of the pelvis was performed using the standard protocol following the bolus administration of intravenous contrast. RADIATION DOSE REDUCTION: This exam was performed according to the departmental dose-optimization program which includes automated exposure control, adjustment of the mA  and/or kV according to patient size and/or use of iterative reconstruction technique. CONTRAST:  75mL OMNIPAQUE IOHEXOL 350 MG/ML SOLN COMPARISON:  CT angiogram 10/07/2022 FINDINGS: Urinary Tract:  No abnormality visualized. Bowel: There is some soft tissue thickening just beneath the skin surface tracking from the right anal region to the inferior medial right buttocks. There is no evidence for soft tissue gas. There is mild surrounding subcutaneous edema in this region. Findings may represent a fistula. No focal abscess. Visualized bowel is otherwise within normal limits. Vascular/Lymphatic: No pathologically enlarged lymph nodes. No significant vascular abnormality seen. Reproductive:  Prostate gland within normal limits. Other: Subcutaneous densities in the bilateral gluteal regions appear unchanged and may represent old granulomas. There is no ascites. There is no focal abdominal wall hernia. Musculoskeletal: No suspicious bone lesions identified. IMPRESSION: There is soft tissue thickening just beneath the skin surface tracking from the right anal region to the inferior medial right buttocks. There is mild surrounding subcutaneous edema in this region. Findings may represent a fistula. No focal abscess. Electronically Signed   By: Darliss Cheney M.D.   On: 12/08/2022 21:22    Procedures Procedures    Medications Ordered in ED Medications  lidocaine-EPINEPHrine-tetracaine (LET) topical gel (3 mLs Topical Given 12/08/22 1947)  morphine (PF) 4 MG/ML injection 4 mg (4 mg Intravenous Given 12/08/22 2023)  ondansetron (ZOFRAN) injection 4 mg (4 mg Intravenous Given 12/08/22 2023)  iohexol (OMNIPAQUE) 350 MG/ML injection 75 mL (75 mLs Intravenous Contrast Given 12/08/22 2107)  amoxicillin-clavulanate (AUGMENTIN) 875-125 MG per tablet 1 tablet (1 tablet Oral Given 12/08/22 2240)   ED Course/ Medical Decision Making/ A&P    42 year old history of HIV, prior substance abuse, syphilis here for evaluation of  rectal pain.  Began 4 days ago however worsening. He has appointment with Rexene Alberts next Friday however could not wait. He looked at the area and noted a "pimple" like lesion on the side of his rectum. Symptoms began after a "rough" receptive anal intercourse.  He has no systemic symptoms.  On exam he does have abscess however given location to anal sphincter will get CT imaging to ensure no extension into rectum.  Labs and imaging personally viewed and interpreted:  CBC leukocytosis 10.6 BMP glucose 109 CT Pelvis no drainable fluid collection does show possible fistula, subcu edema  Patient reassessed. I discussed his labs and imaging.  Will plan on discussing with general surgery  CONSULT with gen surgery, Dr. Sophronia Simas.  She has reviewed patient's imaging.  She does not see any drainable fluid collection however does suspect he likely has a fistula.  She recommends starting on antibiotics and following outpatient in clinic.  I discussed this with patient.  He is agreeable.  He is afebrile, nonseptic, not ill-appearing first dose of antibiotics given here.  Will have him follow-up outpatient, return for new or worsening symptoms  The patient has been appropriately medically screened and/or stabilized in the ED. I have low suspicion for any other emergent medical condition which would require further screening, evaluation or treatment in the ED or require inpatient management.  Patient is hemodynamically stable and in no acute distress.  Patient able to ambulate in department prior to ED.  Evaluation does not show acute pathology that would require ongoing or additional emergent interventions while in the emergency department or further inpatient treatment.  I have discussed the diagnosis with the patient and answered all questions.  Pain is been managed while in the emergency department and patient has no further complaints prior to discharge.  Patient is comfortable with plan discussed  in room and is stable for discharge at this time.  I have discussed strict return precautions for returning to the emergency department.  Patient was encouraged to follow-up with PCP/specialist refer to at discharge.                              Medical Decision Making Amount and/or Complexity of Data Reviewed External Data Reviewed: labs, radiology and notes. Labs: ordered. Decision-making details documented in ED Course. Radiology: ordered and independent interpretation performed. Decision-making details documented in ED Course.  Risk OTC drugs. Prescription drug management. Diagnosis or treatment significantly limited by social determinants of health.          Final Clinical Impression(s) / ED Diagnoses Final diagnoses:  Rectal fistula    Rx / DC Orders ED Discharge Orders          Ordered    amoxicillin-clavulanate (AUGMENTIN) 875-125 MG tablet  Every 12 hours        12/08/22 2234              Khadijah Mastrianni A, PA-C 12/08/22 2315    Glyn Ade, MD 12/09/22 1453

## 2022-12-08 NOTE — Discharge Instructions (Addendum)
It was a pleasure taking care of you here in the emergency department  Your CT scan today showed a possible fistula in your rectum.  I spoke with the general surgeon who recommended starting on antibiotics and warm compresses  Please make sure to call the number listed in your discharge paperwork on Monday to schedule an appointment.  Let them know you are seen in the emergency department you were supposed to follow-up  Return for new or worsening symptoms

## 2022-12-08 NOTE — ED Triage Notes (Signed)
Pt came in via POV d/t the last 4 days having a Rt sided anal fissure (per pt) that is hard to the touch & has a pustule at the end of it. Pt reports he has had "rough anal sex lately" & is concerned that he may need ABT so he came in for eval.

## 2022-12-12 ENCOUNTER — Other Ambulatory Visit: Payer: Self-pay

## 2022-12-12 DIAGNOSIS — F3132 Bipolar disorder, current episode depressed, moderate: Secondary | ICD-10-CM | POA: Diagnosis not present

## 2022-12-12 DIAGNOSIS — G540 Brachial plexus disorders: Secondary | ICD-10-CM

## 2022-12-12 DIAGNOSIS — F1510 Other stimulant abuse, uncomplicated: Secondary | ICD-10-CM | POA: Diagnosis not present

## 2022-12-12 MED ORDER — GABAPENTIN 400 MG PO CAPS: ORAL_CAPSULE | ORAL | 2 refills | Status: DC

## 2022-12-12 MED ORDER — DULOXETINE HCL 30 MG PO CPEP: ORAL_CAPSULE | ORAL | 2 refills | Status: DC

## 2022-12-13 ENCOUNTER — Telehealth: Payer: Self-pay

## 2022-12-13 NOTE — Telephone Encounter (Signed)
Patient called office stating he would like to cancel upcoming appointment with [facility]. States that he has established care with another provider and will follow up with them.  Appointment canceled. Juanita Laster, RMA

## 2022-12-14 ENCOUNTER — Ambulatory Visit: Payer: Medicare (Managed Care) | Admitting: Infectious Diseases

## 2022-12-17 DIAGNOSIS — K6289 Other specified diseases of anus and rectum: Secondary | ICD-10-CM | POA: Diagnosis not present

## 2022-12-25 ENCOUNTER — Ambulatory Visit: Payer: Medicare (Managed Care) | Admitting: Licensed Clinical Social Worker

## 2022-12-25 ENCOUNTER — Encounter: Payer: Medicare (Managed Care) | Admitting: Licensed Clinical Social Worker

## 2022-12-25 DIAGNOSIS — F3181 Bipolar II disorder: Secondary | ICD-10-CM

## 2022-12-25 NOTE — BH Specialist Note (Signed)
Integrated Behavioral Health via Telemedicine Visit  12/25/2022 Kenneth Hunt [id number]  Number of Integrated Behavioral Health Clinician visits: No data recorded Session Start time: 1430   Session End time: 1517  Total time in minutes: 47   Referring Provider: Miguel Aschoff, MD Patient/Family location: Home [facility] Provider location: Office All persons participating in visit: [facility] and Patient Types of Service: Telephone visit and Introduction only  I connected with Kenneth Hunt  via  Telephone or Video Enabled Telemedicine Application  (Video is Caregility application) and verified that I am speaking with the correct person using two identifiers. Discussed confidentiality: Yes   I discussed the limitations of telemedicine and the availability of in person appointments.  Discussed there is a possibility of technology failure and discussed alternative modes of communication if that failure occurs.  I discussed that engaging in this telemedicine visit, they consent to the provision of behavioral healthcare .  Patient and/or legal guardian expressed understanding and consented to Telemedicine visit: Yes   Presenting Concerns: Patient and/or family reports the following symptoms/concerns: Anxiety  Patient and/or Family's Strengths/Protective Factors: Sense of purpose  Goals Addressed: Patient will:  Reduce symptoms of: anxiety    Progress towards Goals: Ongoing  Interventions: Interventions utilized:  Supportive Counseling and Link to [employer] Standardized Assessments completed: PHQ-SADS     10/09/2022   10:06 AM 10/09/2022    8:44 AM 08/20/2022    1:51 PM  PHQ-SADS Last 3 Score only  PHQ Adolescent Score 0 0 1      Assessment: Patient currently experiencing anxiety related to medical diagnosis.  Patient is on probation and wanting to begin therapy in hopes to be release from probation.  [facility] recommended substance abuse support groups at  [employer] every Tuesday and Thursday. Patient goal is to stay sober. Choice of drug is Meth.  [facility] recommended patient volunteer at [employer]. [facility] sent patient info.  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    Patient may benefit from ongoing therapy, attending support groups and volunteering.  Plan: Follow up with behavioral health clinician on : Patient will follow up with [facility] after surgery and schedule appointment with [facility].   I discussed the assessment and treatment plan with the patient and/or parent/guardian. They were provided an opportunity to ask questions and all were answered. They agreed with the plan and demonstrated an understanding of the instructions.   They were advised to call back or seek an in-person evaluation if the symptoms worsen or if the condition fails to improve as anticipated.  Christen Butter, MSW, LCSW-A She/Her Behavioral Health Clinician [facility]  Internal Medicine Center Direct [phone]  Fax [phone] Main Office Phone: [phone] [address] [place] [place], [place] [address] Website: [facility] Internal Medicine [facility] [place]  [place], [place]  [employer]

## 2022-12-28 DIAGNOSIS — B20 Human immunodeficiency virus [HIV] disease: Secondary | ICD-10-CM | POA: Diagnosis not present

## 2022-12-28 DIAGNOSIS — K6282 Dysplasia of anus: Secondary | ICD-10-CM | POA: Diagnosis not present

## 2022-12-31 DIAGNOSIS — K610 Anal abscess: Secondary | ICD-10-CM | POA: Diagnosis not present

## 2023-01-15 ENCOUNTER — Encounter: Payer: Self-pay | Admitting: *Deleted

## 2023-01-28 ENCOUNTER — Ambulatory Visit: Payer: Self-pay | Admitting: General Surgery

## 2023-01-28 DIAGNOSIS — K603 Anal fistula: Secondary | ICD-10-CM | POA: Diagnosis not present

## 2023-01-28 NOTE — H&P (Signed)
PROVIDER:  Elenora Gamma, MD  MRN: [id number] DOB: March 16, 1981 DATE OF ENCOUNTER: 01/28/2023  Subjective  Chief Complaint: No chief complaint on file.     History of Present Illness:  .  42 year old HIV-positive male who presents to the office for evaluation of rectal pain.  He was seen by our PA and was thought to have a possible fistula.  He was placed on antibiotics and began to have less pain.  He has been noticing drainage for ~2 months.  We tried another course of antibiotics but he continues to have purulent drainage. 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 MRI brain and cervical spine showed bilateral neural foraminal cysts C4-C5, C5-C6, C6-C7, brain within normal limits.  - continue cymbalta, flexeril, neurontin - f/u with Dr. 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/30/2017   RPR 1:1 12/25/17 s/p adequate treatment   HIV (human immunodeficiency virus infection) (HCC)    IV drug abuse (HCC)    TOS (thoracic outlet syndrome)    Past Surgical History:  Procedure Laterality Date   tos surgery     Family History  Problem Relation Age of Onset   COPD Mother    Social History   Socioeconomic History   Marital status: Single    Spouse name: Not on file   Number of children: Not on file   Years of education: Not on file   Highest education level: Not on file  Occupational History   Not on file  Tobacco Use   Smoking status: Never   Smokeless tobacco: Never  Vaping Use   Vaping Use: Never used  Substance and Sexual Activity   Alcohol use: Not Currently   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, Methamphetamines    Comment: last used last nght   Sexual activity: Yes    Partners: Male    Comment: declined condoms  Other Topics Concern   Not on file  Social History Narrative   Not on file   Social Determinants of Health    Financial Resource Strain: Low Risk  (10/09/2022)   Overall Financial Resource Strain (CARDIA)    Difficulty of Paying Living Expenses: Not very hard  Food Insecurity: No Food Insecurity (10/09/2022)   Hunger Vital Sign    Worried About Running Out of Food in the Last Year: Never true    Ran Out of Food in the Last Year: Never true  Transportation Needs: No Transportation Needs (10/09/2022)   PRAPARE - Administrator, Civil Service (Medical): No    Lack of Transportation (Non-Medical): No  Physical Activity: Inactive (10/09/2022)   Exercise Vital Sign    Days of Exercise per Week: 0 days    Minutes of Exercise per Session: 0 min  Stress: Stress Concern Present (10/09/2022)   [employer] of Occupational Health - Occupational Stress Questionnaire    Feeling of Stress : Rather much  Social Connections: Moderately Integrated (10/09/2022)   Social Connection and Isolation Panel [NHANES]    Frequency of Communication with Friends and Family: More than three times a week    Frequency of Social Gatherings with Friends and Family: Three times a week    Attends Religious Services: More than 4 times per year    Active Member of Clubs or Organizations: No    Attends Banker Meetings: More  than 4 times per year    Marital Status: Never married  Intimate Partner Violence: Not At Risk (10/09/2022)   Humiliation, Afraid, Rape, and Kick questionnaire    Fear of Current or Ex-Partner: No    Emotionally Abused: No    Physically Abused: No    Sexually Abused: No    Current Outpatient Medications:    amoxicillin-clavulanate (AUGMENTIN) 875-125 MG tablet, Take 1 tablet by mouth every 12 (twelve) hours., Disp: 14 tablet, Rfl: 0   ARISTADA 1064 MG/3.9ML prefilled syringe, Inject 1,064 mg into the muscle once., Disp: , Rfl:    bictegravir-emtricitabine-tenofovir AF (BIKTARVY) 50-200-25 MG TABS tablet, Take 1 tablet by mouth daily., Disp: 30 tablet, Rfl: 11   DULoxetine  (CYMBALTA) 30 MG capsule, TAKE 3 CAPSULES(90 MG) BY MOUTH DAILY, Disp: 90 capsule, Rfl: 2   gabapentin (NEURONTIN) 400 MG capsule, TAKE 1 CAPSULE(400 MG) BY MOUTH THREE TIMES DAILY, Disp: 180 capsule, Rfl: 2 Allergies  Allergen Reactions   Haloperidol Other (See Comments)    Tardive dyskinesia   Review of Systems - Negative except as stated above      Objective:   Vitals:  01/28/23 1539 PainSc: 0-No pain    Exam Gen: NAD Abd: soft Rectal: RA lesion draining purulence.  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  We discussed that this could continue to close but is unlikely.  He would like to wait another month or so to see if it will close on its own.  In the meantime we will work on getting him scheduled for exam under anesthesia with fistulotomy versus seton.  We discussed the indications for seton placement and the need for additional surgery if a seton is placed.  We also discussed postoperative pain and discomfort with fistulotomy.  All questions were answered.     Vanita Panda, MD Colon and Rectal Surgery [facility]

## 2023-01-28 NOTE — H&P (View-Only) (Signed)
 PROVIDER:  Eleasha Cataldo [name], MD  MRN: [id number] DOB: 09/16/1980 DATE OF ENCOUNTER: 01/28/2023  Subjective  Chief Complaint: No chief complaint on file.     History of Present Illness:  .  42-year-old HIV-positive male who presents to the office for evaluation of rectal pain.  He was seen by our PA and was thought to have a possible fistula.  He was placed on antibiotics and began to have less pain.  He has been noticing drainage for ~2 months.  We tried another course of antibiotics but he continues to have purulent drainage. 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 MRI brain and cervical spine showed bilateral neural foraminal cysts C4-C5, C5-C6, C6-C7, brain within normal limits.  - continue cymbalta, flexeril, neurontin - f/u with Dr. 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 12/25/17 s/p adequate treatment   HIV (human immunodeficiency virus infection) (HCC)    IV drug abuse (HCC)    TOS (thoracic outlet syndrome)    Past Surgical History:  Procedure Laterality Date   tos surgery     Family History  Problem Relation Age of Onset   COPD Mother    Social History   Socioeconomic History   Marital status: Single    Spouse name: Not on file   Number of children: Not on file   Years of education: Not on file   Highest education level: Not on file  Occupational History   Not on file  Tobacco Use   Smoking status: Never   Smokeless tobacco: Never  Vaping Use   Vaping Use: Never used  Substance and Sexual Activity   Alcohol use: Not Currently   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, Methamphetamines    Comment: last used last nght   Sexual activity: Yes    Partners: Male    Comment: declined condoms  Other Topics Concern   Not on file  Social History Narrative   Not on file   Social Determinants of Health    Financial Resource Strain: Low Risk  (10/09/2022)   Overall Financial Resource Strain (CARDIA)    Difficulty of Paying Living Expenses: Not very hard  Food Insecurity: No Food Insecurity (10/09/2022)   Hunger Vital Sign    Worried About Running Out of Food in the Last Year: Never true    Ran Out of Food in the Last Year: Never true  Transportation Needs: No Transportation Needs (10/09/2022)   PRAPARE - Transportation    Lack of Transportation (Medical): No    Lack of Transportation (Non-Medical): No  Physical Activity: Inactive (10/09/2022)   Exercise Vital Sign    Days of Exercise per Week: 0 days    Minutes of Exercise per Session: 0 min  Stress: Stress Concern Present (10/09/2022)   [employer] of Occupational [employer] - Occupational Stress Questionnaire    Feeling of Stress : Rather much  Social Connections: Moderately Integrated (10/09/2022)   Social Connection and Isolation Panel [NHANES]    Frequency of Communication with Friends and Family: More than three times a week    Frequency of Social Gatherings with Friends and Family: Three times a week    Attends Religious Services: More than 4 times per year    Active Member of Clubs or Organizations: No    Attends Club or Organization Meetings: More   than 4 times per year    Marital Status: Never married  Intimate Partner Violence: Not At Risk (10/09/2022)   Humiliation, Afraid, Rape, and Kick questionnaire    Fear of Current or Ex-Partner: No    Emotionally Abused: No    Physically Abused: No    Sexually Abused: No    Current Outpatient Medications:    amoxicillin-clavulanate (AUGMENTIN) 875-125 MG tablet, Take 1 tablet by mouth every 12 (twelve) hours., Disp: 14 tablet, Rfl: 0   ARISTADA 1064 MG/3.9ML prefilled syringe, Inject 1,064 mg into the muscle once., Disp: , Rfl:    bictegravir-emtricitabine-tenofovir AF (BIKTARVY) 50-200-25 MG TABS tablet, Take 1 tablet by mouth daily., Disp: 30 tablet, Rfl: 11   DULoxetine  (CYMBALTA) 30 MG capsule, TAKE 3 CAPSULES(90 MG) BY MOUTH DAILY, Disp: 90 capsule, Rfl: 2   gabapentin (NEURONTIN) 400 MG capsule, TAKE 1 CAPSULE(400 MG) BY MOUTH THREE TIMES DAILY, Disp: 180 capsule, Rfl: 2 Allergies  Allergen Reactions   Haloperidol Other (See Comments)    Tardive dyskinesia   Review of Systems - Negative except as stated above      Objective:   Vitals:  01/28/23 1539 PainSc: 0-No pain    Exam Gen: NAD Abd: soft Rectal: RA lesion draining purulence.  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  We discussed that this could continue to close but is unlikely.  He would like to wait another month or so to see if it will close on its own.  In the meantime we will work on getting him scheduled for exam under anesthesia with fistulotomy versus seton.  We discussed the indications for seton placement and the need for additional surgery if a seton is placed.  We also discussed postoperative pain and discomfort with fistulotomy.  All questions were answered.     Kenneth Hunt C Tzippy Testerman, MD Colon and Rectal Surgery [facility]   

## 2023-01-31 ENCOUNTER — Encounter (HOSPITAL_BASED_OUTPATIENT_CLINIC_OR_DEPARTMENT_OTHER): Payer: Self-pay | Admitting: General Surgery

## 2023-02-04 NOTE — Progress Notes (Signed)
    Dental Referral Follow-up  Kenneth Hunt  42 y.o., male    Referral Date: 12/20/2022   Seen Date: Currently not scheduled  Dental Contact: [facility] @ [phone]    Patient reports No current dental problems at this time.  Patient provided with contact for [facility] @ [phone] to schedule a dental appointment also informed patient that they will be reach out to him as well for scheduling soon.   Valarie Cones, LPN

## 2023-02-05 ENCOUNTER — Encounter (HOSPITAL_BASED_OUTPATIENT_CLINIC_OR_DEPARTMENT_OTHER): Payer: Self-pay | Admitting: General Surgery

## 2023-02-05 ENCOUNTER — Other Ambulatory Visit: Payer: Self-pay

## 2023-02-05 NOTE — Progress Notes (Signed)
Spoke w/ via phone for pre-op interview---pt Lab needs dos----urine drug screen               Lab results------none COVID test -----patient states asymptomatic no test needed Arrive at -------745 am 02-22-2023 NPO after MN NO Solid Food.  Clear liquids from MN until---645 am Med rec completed Medications to take morning of surgery -----Biktarvy, Cymbalta, Gabapentin Diabetic medication -----n/a Patient instructed no nail polish to be worn day of surgery Patient instructed to bring photo id and insurance card day of surgery Patient aware to have Driver (ride ) / caregiver   Jonny Ruiz sturgess friend  for 24 hours after surgery  Patient Special Instructions -----none Pre-Op special Instructions -----none Patient verbalized understanding of instructions that were given at this phone interview. Patient denies shortness of breath, chest pain, fever, cough at this phone interview.

## 2023-02-19 ENCOUNTER — Telehealth (HOSPITAL_COMMUNITY): Payer: Self-pay

## 2023-02-20 DIAGNOSIS — F1120 Opioid dependence, uncomplicated: Secondary | ICD-10-CM | POA: Diagnosis not present

## 2023-02-21 NOTE — Progress Notes (Signed)
Rn called to advised patient of change in arrival time. Patient to arrive at 0700 instead of 0745. Patient agreed and will arrive at 0700.

## 2023-02-22 ENCOUNTER — Ambulatory Visit (HOSPITAL_BASED_OUTPATIENT_CLINIC_OR_DEPARTMENT_OTHER)
Admission: RE | Admit: 2023-02-22 | Discharge: 2023-02-22 | Disposition: A | Discharge status: Home / Self Care | Payer: Medicare (Managed Care) | Attending: General Surgery | Admitting: General Surgery

## 2023-02-22 ENCOUNTER — Encounter (HOSPITAL_BASED_OUTPATIENT_CLINIC_OR_DEPARTMENT_OTHER)
Admission: RE | Disposition: A | Discharge status: Home / Self Care | Payer: Self-pay | Source: Home / Self Care | Attending: General Surgery

## 2023-02-22 ENCOUNTER — Other Ambulatory Visit: Payer: Self-pay

## 2023-02-22 ENCOUNTER — Ambulatory Visit (HOSPITAL_BASED_OUTPATIENT_CLINIC_OR_DEPARTMENT_OTHER): Payer: Medicare (Managed Care) | Admitting: Certified Registered Nurse Anesthetist

## 2023-02-22 ENCOUNTER — Encounter (HOSPITAL_BASED_OUTPATIENT_CLINIC_OR_DEPARTMENT_OTHER): Payer: Self-pay | Admitting: General Surgery

## 2023-02-22 DIAGNOSIS — F32A Depression, unspecified: Secondary | ICD-10-CM | POA: Insufficient documentation

## 2023-02-22 DIAGNOSIS — K603 Anal fistula: Secondary | ICD-10-CM | POA: Insufficient documentation

## 2023-02-22 DIAGNOSIS — A630 Anogenital (venereal) warts: Secondary | ICD-10-CM | POA: Insufficient documentation

## 2023-02-22 DIAGNOSIS — F418 Other specified anxiety disorders: Secondary | ICD-10-CM

## 2023-02-22 DIAGNOSIS — F1590 Other stimulant use, unspecified, uncomplicated: Secondary | ICD-10-CM | POA: Insufficient documentation

## 2023-02-22 DIAGNOSIS — K6289 Other specified diseases of anus and rectum: Secondary | ICD-10-CM | POA: Insufficient documentation

## 2023-02-22 DIAGNOSIS — G540 Brachial plexus disorders: Secondary | ICD-10-CM | POA: Diagnosis not present

## 2023-02-22 DIAGNOSIS — D013 Carcinoma in situ of anus and anal canal: Secondary | ICD-10-CM | POA: Insufficient documentation

## 2023-02-22 DIAGNOSIS — F419 Anxiety disorder, unspecified: Secondary | ICD-10-CM | POA: Diagnosis not present

## 2023-02-22 DIAGNOSIS — Z01818 Encounter for other preprocedural examination: Secondary | ICD-10-CM

## 2023-02-22 DIAGNOSIS — Z21 Asymptomatic human immunodeficiency virus [HIV] infection status: Secondary | ICD-10-CM | POA: Insufficient documentation

## 2023-02-22 HISTORY — PX: EVALUATION UNDER ANESTHESIA WITH ANAL FISSUROTOMY: SHX5622

## 2023-02-22 HISTORY — DX: Anal fistula, unspecified: K60.30

## 2023-02-22 HISTORY — DX: Bipolar disorder, unspecified: F31.9

## 2023-02-22 HISTORY — PX: MINOR FULGERATION OF ANAL CONDYLOMA: SHX6467

## 2023-02-22 HISTORY — DX: Anal fistula: K60.3

## 2023-02-22 LAB — RAPID URINE DRUG SCREEN, HOSP PERFORMED
Amphetamines: POSITIVE — AB
Barbiturates: NOT DETECTED
Benzodiazepines: NOT DETECTED
Cocaine: NOT DETECTED
Opiates: NOT DETECTED
Tetrahydrocannabinol: NOT DETECTED

## 2023-02-22 SURGERY — EXAM UNDER ANESTHESIA WITH ANAL FISSUROTOMY
Anesthesia: Monitor Anesthesia Care | Site: Anus

## 2023-02-22 MED ORDER — GLYCOPYRROLATE 0.2 MG/ML IJ SOLN
INTRAMUSCULAR | Status: DC | PRN
  Administered 2023-02-22: .2 mg via INTRAVENOUS

## 2023-02-22 MED ORDER — MIDAZOLAM HCL 2 MG/2ML IJ SOLN
INTRAMUSCULAR | Status: DC | PRN
  Administered 2023-02-22: 2 mg via INTRAVENOUS

## 2023-02-22 MED ORDER — OXYCODONE HCL 5 MG/5ML PO SOLN: 5.0000 mg | Freq: Once | ORAL | Status: DC | PRN

## 2023-02-22 MED ORDER — ONDANSETRON HCL 4 MG/2ML IJ SOLN: 4.0000 mg | Freq: Once | INTRAMUSCULAR | Status: DC | PRN

## 2023-02-22 MED ORDER — ONDANSETRON HCL 4 MG/2ML IJ SOLN
INTRAMUSCULAR | Status: DC | PRN
  Administered 2023-02-22: 4 mg via INTRAVENOUS

## 2023-02-22 MED ORDER — BUPIVACAINE-EPINEPHRINE 0.25% -1:200000 IJ SOLN
INTRAMUSCULAR | Status: DC | PRN
  Administered 2023-02-22: 30 mL

## 2023-02-22 MED ORDER — ACETAMINOPHEN 500 MG PO TABS
1000.0000 mg | ORAL_TABLET | ORAL | Status: AC
  Administered 2023-02-22: 1000 mg via ORAL

## 2023-02-22 MED ORDER — FENTANYL CITRATE (PF) 250 MCG/5ML IJ SOLN
INTRAMUSCULAR | Status: DC | PRN
  Administered 2023-02-22 (×2): 25 ug via INTRAVENOUS

## 2023-02-22 MED ORDER — OXYCODONE HCL 5 MG PO TABS: 5.0000 mg | ORAL_TABLET | Freq: Four times a day (QID) | ORAL | 0 refills | Status: DC | PRN

## 2023-02-22 MED ORDER — 0.9 % SODIUM CHLORIDE (POUR BTL) OPTIME
TOPICAL | Status: DC | PRN
  Administered 2023-02-22: 500 mL

## 2023-02-22 MED ORDER — FENTANYL CITRATE (PF) 100 MCG/2ML IJ SOLN: 25.0000 ug | INTRAMUSCULAR | Status: DC | PRN

## 2023-02-22 MED ORDER — ACETAMINOPHEN 500 MG PO TABS
ORAL_TABLET | ORAL | Status: AC
  Filled 2023-02-22: qty 2

## 2023-02-22 MED ORDER — MIDAZOLAM HCL 2 MG/2ML IJ SOLN
INTRAMUSCULAR | Status: AC
  Filled 2023-02-22: qty 2

## 2023-02-22 MED ORDER — MEPERIDINE HCL 25 MG/ML IJ SOLN: 6.2500 mg | INTRAMUSCULAR | Status: DC | PRN

## 2023-02-22 MED ORDER — HEMOSTATIC AGENTS (NO CHARGE) OPTIME
TOPICAL | Status: DC | PRN
  Administered 2023-02-22: 1 via TOPICAL

## 2023-02-22 MED ORDER — PROPOFOL 10 MG/ML IV BOLUS
INTRAVENOUS | Status: DC | PRN
  Administered 2023-02-22: 20 mg via INTRAVENOUS

## 2023-02-22 MED ORDER — ACETAMINOPHEN 325 MG PO TABS: 325.0000 mg | ORAL_TABLET | ORAL | Status: DC | PRN

## 2023-02-22 MED ORDER — BUPIVACAINE LIPOSOME 1.3 % IJ SUSP
INTRAMUSCULAR | Status: DC | PRN
  Administered 2023-02-22: 20 mL

## 2023-02-22 MED ORDER — DEXMEDETOMIDINE HCL IN NACL 80 MCG/20ML IV SOLN
INTRAVENOUS | Status: DC | PRN
  Administered 2023-02-22: 8 ug via INTRAVENOUS

## 2023-02-22 MED ORDER — ACETAMINOPHEN 160 MG/5ML PO SOLN: 325.0000 mg | ORAL | Status: DC | PRN

## 2023-02-22 MED ORDER — PROPOFOL 500 MG/50ML IV EMUL
INTRAVENOUS | Status: DC | PRN
  Administered 2023-02-22: 180 ug/kg/min via INTRAVENOUS

## 2023-02-22 MED ORDER — OXYCODONE HCL 5 MG PO TABS: 5.0000 mg | ORAL_TABLET | Freq: Once | ORAL | Status: DC | PRN

## 2023-02-22 MED ORDER — FENTANYL CITRATE (PF) 100 MCG/2ML IJ SOLN
INTRAMUSCULAR | Status: AC
  Filled 2023-02-22: qty 2

## 2023-02-22 MED ORDER — LIDOCAINE 2% (20 MG/ML) 5 ML SYRINGE
INTRAMUSCULAR | Status: DC | PRN
  Administered 2023-02-22: 40 mg via INTRAVENOUS

## 2023-02-22 MED ORDER — LACTATED RINGERS IV SOLN: INTRAVENOUS | Status: DC

## 2023-02-22 SURGICAL SUPPLY — 53 items
APL SKNCLS STERI-STRIP NONHPOA (GAUZE/BANDAGES/DRESSINGS) ×6
BENZOIN TINCTURE PRP APPL 2/3 (GAUZE/BANDAGES/DRESSINGS) ×6 IMPLANT
BLADE EXTENDED COATED 6.5IN (ELECTRODE) IMPLANT
BLADE SURG 10 STRL SS (BLADE) IMPLANT
BRIEF MESH DISP LRG (UNDERPADS AND DIAPERS) ×3 IMPLANT
COVER BACK TABLE 60X90IN (DRAPES) ×3 IMPLANT
COVER MAYO STAND STRL (DRAPES) ×3 IMPLANT
DRAPE HYSTEROSCOPY (MISCELLANEOUS) IMPLANT
DRAPE LAPAROTOMY 100X72 PEDS (DRAPES) ×3 IMPLANT
DRAPE SHEET LG 3/4 BI-LAMINATE (DRAPES) IMPLANT
DRAPE UTILITY XL STRL (DRAPES) ×3 IMPLANT
ELECT REM PT RETURN 9FT ADLT (ELECTROSURGICAL) ×3
ELECTRODE REM PT RTRN 9FT ADLT (ELECTROSURGICAL) ×3 IMPLANT
GAUZE 4X4 16PLY ~~LOC~~+RFID DBL (SPONGE) ×3 IMPLANT
GAUZE PAD ABD 8X10 STRL (GAUZE/BANDAGES/DRESSINGS) ×3 IMPLANT
GAUZE SPONGE 4X4 12PLY STRL (GAUZE/BANDAGES/DRESSINGS) ×3 IMPLANT
GLOVE BIO SURGEON STRL SZ 6.5 (GLOVE) ×3 IMPLANT
GLOVE INDICATOR 6.5 STRL GRN (GLOVE) ×3 IMPLANT
GOWN STRL REUS W/TWL XL LVL3 (GOWN DISPOSABLE) ×3 IMPLANT
HYDROGEN PEROXIDE 16OZ (MISCELLANEOUS) ×3 IMPLANT
IV CATH 14GX2 1/4 (CATHETERS) ×3 IMPLANT
IV CATH 18G SAFETY (IV SOLUTION) ×3 IMPLANT
KIT SIGMOIDOSCOPE (SET/KITS/TRAYS/PACK) IMPLANT
KIT TURNOVER CYSTO (KITS) ×3 IMPLANT
LEGGING LITHOTOMY PAIR STRL (DRAPES) IMPLANT
LOOP VASCLR MAXI BLUE 18IN ST (MISCELLANEOUS) IMPLANT
LOOP VASCULAR MAXI 18 BLUE (MISCELLANEOUS)
LOOPS VASCLR MAXI BLUE 18IN ST (MISCELLANEOUS) IMPLANT
NDL HYPO 22X1.5 SAFETY MO (MISCELLANEOUS) ×2 IMPLANT
NEEDLE HYPO 22X1.5 SAFETY MO (MISCELLANEOUS) ×3 IMPLANT
NS IRRIG 500ML POUR BTL (IV SOLUTION) ×3 IMPLANT
PACK BASIN DAY SURGERY FS (CUSTOM PROCEDURE TRAY) ×3 IMPLANT
PAD ARMBOARD 7.5X6 YLW CONV (MISCELLANEOUS) IMPLANT
PENCIL SMOKE EVACUATOR (MISCELLANEOUS) ×3 IMPLANT
SLEEVE SCD COMPRESS KNEE MED (STOCKING) ×3 IMPLANT
SPIKE FLUID TRANSFER (MISCELLANEOUS) ×3 IMPLANT
SPONGE HEMORRHOID 8X3CM (HEMOSTASIS) ×1 IMPLANT
SPONGE SURGIFOAM ABS GEL 12-7 (HEMOSTASIS) IMPLANT
SUCTION TUBE FRAZIER 10FR DISP (SUCTIONS) IMPLANT
SUT CHROMIC 2 0 SH (SUTURE) ×1 IMPLANT
SUT CHROMIC 3 0 SH 27 (SUTURE) ×1 IMPLANT
SUT ETHIBOND 0 (SUTURE) IMPLANT
SUT VIC AB 2-0 SH 27 (SUTURE)
SUT VIC AB 2-0 SH 27XBRD (SUTURE) IMPLANT
SUT VIC AB 3-0 SH 18 (SUTURE) IMPLANT
SUT VIC AB 3-0 SH 27 (SUTURE)
SUT VIC AB 3-0 SH 27XBRD (SUTURE) IMPLANT
SYR CONTROL 10ML LL (SYRINGE) ×3 IMPLANT
TOWEL OR 17X24 6PK STRL BLUE (TOWEL DISPOSABLE) ×3 IMPLANT
TRAY DSU PREP LF (CUSTOM PROCEDURE TRAY) ×3 IMPLANT
TUBE CONNECTING 12X1/4 (SUCTIONS) ×3 IMPLANT
VASCULAR TIE MAXI BLUE 18IN ST (MISCELLANEOUS)
YANKAUER SUCT BULB TIP NO VENT (SUCTIONS) ×3 IMPLANT

## 2023-02-22 NOTE — Interval H&P Note (Signed)
History and Physical Interval Note:  02/22/2023 7:23 AM  Kenneth Hunt  has presented today for surgery, with the diagnosis of ANAL FISTULA.  The various methods of treatment have been discussed with the patient and family. After consideration of risks, benefits and other options for treatment, the patient has consented to  Procedure(s): INTERROGATION EXAM UNDER ANESTHESIA WITH ANAL FISTULOTOMY (N/A) FISTULOTOMY VS SETON (N/A) as a surgical intervention.  The patient's history has been reviewed, patient examined, no change in status, stable for surgery.  I have reviewed the patient's chart and labs.  Questions were answered to the patient's satisfaction.     Vanita Panda, MD  Colorectal and General Surgery [facility]

## 2023-02-22 NOTE — Anesthesia Postprocedure Evaluation (Signed)
Anesthesia Post Note  Patient: Kenneth Hunt  Procedure(s) Performed: INTERROGATION EXAM UNDER ANESTHESIA WITH ANAL FISTULOTOMY (Anus) ABLATION OF ANAL CONDYLOMA (Anus)     Patient location during evaluation: PACU Anesthesia Type: MAC Level of consciousness: awake and alert Pain management: pain level controlled Vital Signs Assessment: post-procedure vital signs reviewed and stable Respiratory status: spontaneous breathing, nonlabored ventilation, respiratory function stable and patient connected to nasal cannula oxygen Cardiovascular status: stable and blood pressure returned to baseline Postop Assessment: no apparent nausea or vomiting Anesthetic complications: no   No notable events documented.  Last Vitals:  Vitals:   02/22/23 0945 02/22/23 1000  BP: (!) 95/56 (!) 103/45  Pulse: 86 72  Resp: 10 11  Temp: 36.8 C   SpO2: 97% 100%    Last Pain:  Vitals:   02/22/23 1000  TempSrc:   PainSc: 0-No pain                 Oral Remache

## 2023-02-22 NOTE — Discharge Instructions (Addendum)
ANORECTAL SURGERY: POST OP INSTRUCTIONS Take your usually prescribed home medications unless otherwise directed. DIET: During the first few hours after surgery sip on some liquids until you are able to urinate.  It is normal to not urinate for several hours after this surgery.  If you feel uncomfortable, please contact the office for instructions.  After you are able to urinate,you may eat, if you feel like it.  Follow a light bland diet the first 24 hours after arrival home, such as soup, liquids, crackers, etc.  Be sure to include lots of fluids daily (6-8 glasses).  Avoid fast food or heavy meals, as your are more likely to get nauseated.  Eat a low fat diet the next few days after surgery.  Limit caffeine intake to 1-2 servings a day. PAIN CONTROL: Pain is best controlled by a usual combination of several different methods TOGETHER: Muscle relaxation: Soak in a warm bath (or Sitz bath) three times a day and after bowel movements.  Continue to do this until all pain is resolved. Over the counter pain medication Prescription pain medication Most patients will experience some swelling and discomfort in the anus/rectal area and incisions.  Heat such as warm towels, sitz baths, warm baths, etc to help relax tight/sore spots and speed recovery.  Some people prefer to use ice, especially in the first couple days after surgery, as it may decrease the pain and swelling, or alternate between ice & heat.  Experiment to what works for you.  Swelling and bruising can take several weeks to resolve.  Pain can take even longer to completely resolve. It is helpful to take an over-the-counter pain medication regularly for the first few weeks.  Choose one of the following that works best for you: Naproxen (Aleve, etc)  Two 220mg  tabs twice a day Ibuprofen (Advil, etc) Three 200mg  tabs four times a day (every meal & bedtime) A  prescription for pain medication (such as percocet, oxycodone, hydrocodone, etc) should be  given to you upon discharge.  Take your pain medication as prescribed.  If you are having problems/concerns with the prescription medicine (does not control pain, nausea, vomiting, rash, itching, etc), please call us [phone] to see if we need to switch you to a different pain medicine that will work better for you and/or control your side effect better. If you need a refill on your pain medication, please contact your pharmacy.  They will contact our office to request authorization. Prescriptions will not be filled after 5 pm or on week-ends. KEEP YOUR BOWELS REGULAR and AVOID CONSTIPATION The goal is one to two soft bowel movements a day.  You should at least have a bowel movement every other day. Avoid getting constipated.  Between the surgery and the pain medications, it is common to experience some constipation. This can be very painful after rectal surgery.  Increasing fluid intake and taking a fiber supplement (such as Metamucil, Citrucel, FiberCon, etc) 1-2 times a day regularly will usually help prevent this problem from occurring.  A stool softener like colace is also recommended.  This can be purchased over the counter at your pharmacy.  You can take it up to 3 times a day.  If you do not have a bowel movement after 24 hrs since your surgery, take one does of milk of magnesia.  If you still haven't had a bowel movement 8-12 hours after that dose, take another dose.  If you don't have a bowel movement 48 hrs after surgery,  purchase a Fleets enema from the drug store and administer gently per package instructions.  If you still are having trouble with your bowel movements after that, please call the office for further instructions. If you develop diarrhea or have many loose bowel movements, simplify your diet to bland foods & liquids for a few days.  Stop any stool softeners and decrease your fiber supplement.  Switching to mild anti-diarrheal medications (Kayopectate, Pepto Bismol) can help.   If this worsens or does not improve, please call us.  Wound Care Remove your bandages before your first bowel movement or 8 hours after surgery.     Remove any wound packing material at this tim,e as well.  You do not need to repack the wound unless instructed otherwise.  Wear an absorbent pad or soft cotton gauze in your underwear to catch any drainage and help keep the area clean. You should change this every 2-3 hours while awake. Keep the area clean and dry.  Bathe / shower every day, especially after bowel movements.  Keep the area clean by showering / bathing over the incision / wound.   It is okay to soak an open wound to help wash it.  Wet wipes or showers / gentle washing after bowel movements is often less traumatic than regular toilet paper. You may have some styrofoam-like soft packing in the rectum which will come out with the first bowel movement.  You will often notice bleeding with bowel movements.  This should slow down by the end of the first week of surgery Expect some drainage.  This should slow down, too, by the end of the first week of surgery.  Wear an absorbent pad or soft cotton gauze in your underwear until the drainage stops. Do Not sit on a rubber or pillow ring.  This can make you symptoms worse.  You may sit on a soft pillow if needed.  ACTIVITIES as tolerated:   You may resume regular (light) daily activities beginning the next day--such as daily self-care, walking, climbing stairs--gradually increasing activities as tolerated.  If you can walk 30 minutes without difficulty, it is safe to try more intense activity such as jogging, treadmill, bicycling, low-impact aerobics, swimming, etc. Save the most intensive and strenuous activity for last such as sit-ups, heavy lifting, contact sports, etc  Refrain from any heavy lifting or straining until you are off narcotics for pain control.   You may drive when you are no longer taking prescription pain medication, you can  comfortably sit for long periods of time, and you can safely maneuver your car and apply brakes. You may have sexual intercourse when it is comfortable.  FOLLOW UP in our office Please call [facility] at [phone] to set up an appointment to see your surgeon in the office for a follow-up appointment approximately 3-4 weeks after your surgery. Make sure that you call for this appointment the day you arrive home to insure a convenient appointment time. 10. IF YOU HAVE DISABILITY OR FAMILY LEAVE FORMS, BRING THEM TO THE OFFICE FOR PROCESSING.  DO NOT GIVE THEM TO YOUR DOCTOR.     WHEN TO CALL us [phone]: Poor pain control Reactions / problems with new medications (rash/itching, nausea, etc)  Fever over 101.5 F (38.5 C) Inability to urinate Nausea and/or vomiting Worsening swelling or bruising Continued bleeding from incision. Increased pain, redness, or drainage from the incision  The clinic staff is available to answer your questions during regular business hours (8:30am-5pm).  Please don't hesitate to call and ask to speak to one of our nurses for clinical concerns.   A surgeon from [facility] is always on call at the hospitals   If you have a medical emergency, go to the nearest emergency room or call 911.    [facility] [address], [place], [place]  [address] ? MAIN: [phone] ? TOLL FREE: [phone] ? FAX [phone] [email or web address]   No acetaminophen/Tylenol until after 3:30 pm today if needed.   Post Anesthesia Home Care Instructions  Activity: Get plenty of rest for the remainder of the day. A responsible individual must stay with you for 24 hours following the procedure.  For the next 24 hours, DO NOT: -Drive a car -Advertising copywriter -Drink alcoholic beverages -Take any medication unless instructed by your physician -Make any legal decisions or sign important papers.  Meals: Start  with liquid foods such as gelatin or soup. Progress to regular foods as tolerated. Avoid greasy, spicy, heavy foods. If nausea and/or vomiting occur, drink only clear liquids until the nausea and/or vomiting subsides. Call your physician if vomiting continues.  Special Instructions/Symptoms: Your throat may feel dry or sore from the anesthesia or the breathing tube placed in your throat during surgery. If this causes discomfort, gargle with warm salt water. The discomfort should disappear within 24 hours.   Information for Discharge Teaching: EXPAREL (bupivacaine liposome injectable suspension)   Your surgeon or anesthesiologist gave you EXPAREL(bupivacaine) to help control your pain after surgery.  EXPAREL is a local anesthetic that provides pain relief by numbing the tissue around the surgical site. EXPAREL is designed to release pain medication over time and can control pain for up to 72 hours. Depending on how you respond to EXPAREL, you may require less pain medication during your recovery.  Possible side effects: Temporary loss of sensation or ability to move in the area where bupivacaine was injected. Nausea, vomiting, constipation Rarely, numbness and tingling in your mouth or lips, lightheadedness, or anxiety may occur. Call your doctor right away if you think you may be experiencing any of these sensations, or if you have other questions regarding possible side effects.  Follow all other discharge instructions given to you by your surgeon or nurse. Eat a healthy diet and drink plenty of water or other fluids.  If you return to the hospital for any reason within 96 hours following the administration of EXPAREL, it is important for health care providers to know that you have received this anesthetic. A teal colored band has been placed on your arm with the date, time and amount of EXPAREL you have received in order to alert and inform your health care providers. Please leave this  armband in place for the full 96 hours following administration, and then you may remove the band. (May remove Tuesday 02/26/23)

## 2023-02-22 NOTE — Transfer of Care (Signed)
Immediate Anesthesia Transfer of Care Note  Patient: Kenneth Hunt  Procedure(s) Performed: INTERROGATION EXAM UNDER ANESTHESIA WITH ANAL FISTULOTOMY (Anus) ABLATION OF ANAL CONDYLOMA (Anus)  Patient Location: PACU  Anesthesia Type:MAC  Level of Consciousness: sedated  Airway & Oxygen Therapy: Patient Spontanous Breathing and Patient connected to nasal cannula oxygen  Post-op Assessment: Report given to RN and Post -op Vital signs reviewed and stable  Post vital signs: Reviewed and stable  Last Vitals:  Vitals Value Taken Time  BP 118/101 02/22/23 0923  Temp    Pulse 100 02/22/23 0927  Resp 8 02/22/23 0927  SpO2 97 % 02/22/23 0927  Vitals shown include unfiled device data.  Last Pain:  Vitals:   02/22/23 0724  TempSrc: Oral  PainSc: 0-No pain      Patients Stated Pain Goal: 3 (02/22/23 0724)  Complications: No notable events documented.

## 2023-02-22 NOTE — Op Note (Addendum)
02/22/2023  9:17 AM  PATIENT:  Kenneth Hunt  43 y.o. male  Patient Care Team: Colbert Coyer, Alyson Locket, MD as PCP - General  PRE-OPERATIVE DIAGNOSIS:  ANAL FISTULA  POST-OPERATIVE DIAGNOSIS:  ANAL FISTULA, ANAL CONDYLOMA  PROCEDURE:  INTERROGATION OF ANAL FISTULA TRACT, FISTULOTOMY, ABLATION OF ANAL CONDYLOMA    Surgeon(s): Romie Levee, MD  ASSISTANT: none   ANESTHESIA:   local and MAC  SPECIMEN:  Source of Specimen:  condyloma  DISPOSITION OF SPECIMEN:  PATHOLOGY  COUNTS:  YES  PLAN OF CARE: Discharge to home after PACU  PATIENT DISPOSITION:  PACU - hemodynamically stable.  INDICATION: 42 y.o. M with perirectal abscess and anal fistula.  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  MAC anesthesia was induced without difficulty. The patient was then positioned in prone jackknife position with buttocks gently taped apart.  The patient was then prepped and draped in usual sterile fashion.  SCDs were noted to be in place prior to the initiation of anesthesia. A surgical timeout was performed indicating the correct patient, procedure, positioning and need for preoperative antibiotics.  A rectal block was performed using Marcaine with epinephrine mixed with Experel.    I began with a digital rectal exam.  There were several soft masses palpated within the anal canal.  I then placed a Hill-Ferguson anoscope into the anal canal and evaluated this completely.  There were moderate sized condylomatous lesions throughout the anal canal.  I then inserted an S shaped fistula probe into the external opening.  This did not appear to traverse any external  sphincter fibers and approximately 10% of the internal anal sphincter.  I decided to perform a fistulotomy.  This was completed with electrocautery.  Given the extent of the anal condyloma, I was concerned for seeding of the fistula tract and therefore I decided to go ahead with ablation of the anal condyloma.  I remove several specimens for pathology.  I then use electrocautery to ablate all the condylomatous lesions that were noted.  Once this was complete hemostasis was achieved using interrupted 3-0 chromic sutures and electrocautery.  I then marsupialized the fistula tract with a 2-0 chromic suture.  Once this was complete the area was infused with subcutaneous Marcaine mixed with Exparel for postoperative pain control.  A Gelfoam was placed and a dressing was applied.  The patient was then awakened from anesthesia and sent to the postanesthesia care unit in stable condition.  All counts were correct per operating room staff.  Vanita Panda, MD  Colorectal and General Surgery [facility]

## 2023-02-22 NOTE — Anesthesia Preprocedure Evaluation (Addendum)
Anesthesia Evaluation  Patient identified by MRN, date of birth, ID band Patient awake    Reviewed: Allergy & Precautions, H&P , NPO status , Patient's Chart, lab work & pertinent test results  Airway Mallampati: II  TM Distance: >3 FB Neck ROM: Full    Dental no notable dental hx. (+) Teeth Intact, Poor Dentition, Dental Advisory Given   Pulmonary neg pulmonary ROS   Pulmonary exam normal breath sounds clear to auscultation       Cardiovascular Exercise Tolerance: Good negative cardio ROS Normal cardiovascular exam Rhythm:Regular Rate:Normal     Neuro/Psych  PSYCHIATRIC DISORDERS Anxiety Depression Bipolar Disorder   Seen by [facility] neurology. MRI brain and cervical spine showed bilateral neural foraminal cysts C4-C5, C5-C6, C6-C7, brain within normal limits.  Neuromuscular disease negative neurological ROS  negative psych ROS   GI/Hepatic negative GI ROS, Neg liver ROS,,,(+)     substance abuse  IV drug use  Endo/Other  negative endocrine ROS    Renal/GU negative Renal ROS  negative genitourinary   Musculoskeletal negative musculoskeletal ROS (+)    Abdominal   Peds negative pediatric ROS (+)  Hematology negative hematology ROS (+) HIV  Anesthesia Other Findings   Reproductive/Obstetrics negative OB ROS                             Anesthesia Physical Anesthesia Plan  ASA: 3  Anesthesia Plan: MAC   Post-op Pain Management: Minimal or no pain anticipated   Induction: Intravenous  PONV Risk Score and Plan: 2 and Treatment may vary due to age or medical condition and Propofol infusion  Airway Management Planned: Mask, Natural Airway and Simple Face Mask  Additional Equipment: None  Intra-op Plan:   Post-operative Plan:   Informed Consent: I have reviewed the patients History and Physical, chart, labs and discussed the procedure including the risks, benefits and  alternatives for the proposed anesthesia with the patient or authorized representative who has indicated his/her understanding and acceptance.       Plan Discussed with: Anesthesiologist and CRNA  Anesthesia Plan Comments:        Anesthesia Quick Evaluation

## 2023-02-25 ENCOUNTER — Encounter (HOSPITAL_BASED_OUTPATIENT_CLINIC_OR_DEPARTMENT_OTHER): Payer: Self-pay | Admitting: General Surgery

## 2023-02-25 DIAGNOSIS — F1120 Opioid dependence, uncomplicated: Secondary | ICD-10-CM | POA: Diagnosis not present

## 2023-02-25 LAB — SURGICAL PATHOLOGY

## 2023-03-06 DIAGNOSIS — F3132 Bipolar disorder, current episode depressed, moderate: Secondary | ICD-10-CM | POA: Diagnosis not present

## 2023-03-06 DIAGNOSIS — F1510 Other stimulant abuse, uncomplicated: Secondary | ICD-10-CM | POA: Diagnosis not present

## 2023-03-07 ENCOUNTER — Other Ambulatory Visit: Payer: Self-pay | Admitting: Infectious Diseases

## 2023-03-07 DIAGNOSIS — B20 Human immunodeficiency virus [HIV] disease: Secondary | ICD-10-CM

## 2023-03-08 DIAGNOSIS — F1520 Other stimulant dependence, uncomplicated: Secondary | ICD-10-CM | POA: Diagnosis not present

## 2023-03-14 ENCOUNTER — Encounter: Payer: Self-pay | Admitting: Infectious Diseases

## 2023-03-14 ENCOUNTER — Other Ambulatory Visit: Payer: Self-pay

## 2023-03-14 ENCOUNTER — Ambulatory Visit (INDEPENDENT_AMBULATORY_CARE_PROVIDER_SITE_OTHER): Payer: Medicare (Managed Care) | Admitting: Infectious Diseases

## 2023-03-14 VITALS — BP 126/75 | HR 84 | Temp 97.9°F | Wt 184.0 lb

## 2023-03-14 DIAGNOSIS — F1521 Other stimulant dependence, in remission: Secondary | ICD-10-CM | POA: Diagnosis not present

## 2023-03-14 DIAGNOSIS — R85612 Low grade squamous intraepithelial lesion on cytologic smear of anus (LGSIL): Secondary | ICD-10-CM

## 2023-03-14 DIAGNOSIS — Z21 Asymptomatic human immunodeficiency virus [HIV] infection status: Secondary | ICD-10-CM

## 2023-03-14 DIAGNOSIS — Z113 Encounter for screening for infections with a predominantly sexual mode of transmission: Secondary | ICD-10-CM

## 2023-03-14 DIAGNOSIS — B20 Human immunodeficiency virus [HIV] disease: Secondary | ICD-10-CM | POA: Diagnosis not present

## 2023-03-14 MED ORDER — BIKTARVY 50-200-25 MG PO TABS
1.0000 | ORAL_TABLET | Freq: Every day | ORAL | 11 refills | Status: DC
Start: 2023-03-14 — End: 2024-03-18

## 2023-03-14 NOTE — Patient Instructions (Signed)
   At your ability, check into them online or call to schedule an appointment if your insurance is accepted (from what I could see online they accept BCBS plans).

## 2023-03-14 NOTE — Progress Notes (Addendum)
Name: Kenneth Hunt  DOB: 27-Aug-1980 MRN: [id number] PCP: Philomena Doheny, MD     Brief Narrative:  Kenneth Hunt is a 42 y.o. male with well controlled HIV.  Transferred care from [facility] in [place]  [phone]) HIV Risk: MSM History of OIs: none known  Intake Labs 07/23/2019: Hep B sAg (-), sAb (-), cAb (-); Hep A (immune), Hep C (-) Quantiferon (-) HLA B*5701 (-) G6PD: () Toxo IgG: (-)   Previous Regimens: Biktarvy   Genotypes: None on record    Subjective:   Chief Complaint  Patient presents with   Follow-up      HPI: Kenneth Hunt is here for 57m folow up. Continues to do well on Biktarvy everyday without any significant lapses in doses and no concern for side effects. Has been getting refills from pharmacy timely.   Still working on staying clean - has been having cravings - talking to supportive family helps with this. A big trigger for use pertains to sexual preference; interested in potential cbt regarding sexual practices.   Had HRA / ablation for HSIL with Dr. Cresenciano Genre at [facility] clinic. Recently with anal fistula excision with Dr. Maisie Fus and feels that has been recovering well.     Review of Systems  Constitutional:  Negative for appetite change, chills, fatigue, fever and unexpected weight change.  Eyes:  Negative for visual disturbance.  Respiratory:  Negative for cough and shortness of breath.   Cardiovascular:  Negative for chest pain and leg swelling.  Gastrointestinal:  Negative for abdominal pain, diarrhea and nausea.  Genitourinary:  Negative for dysuria, genital sores and penile discharge.  Musculoskeletal:  Negative for joint swelling.  Skin:  Negative for color change and rash.  Neurological:  Negative for dizziness and headaches.  Hematological:  Negative for adenopathy.  Psychiatric/Behavioral:  Negative for sleep disturbance. The patient is not nervous/anxious.      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 MRI brain and cervical spine showed bilateral neural foraminal cysts C4-C5, C5-C6, C6-C7, brain within normal limits.  - continue cymbalta, flexeril, neurontin - f/u with Dr. Debbra Riding [facility] neurology 03/28/19   Depression    History of syphilis 12/30/2017   RPR 1:1 12/25/17 s/p adequate treatment   HIV (human immunodeficiency virus infection) (HCC)    IV drug abuse (HCC)    TOS (thoracic outlet syndrome)     Outpatient Medications Prior to Visit  Medication Sig Dispense Refill   ARISTADA 1064 MG/3.9ML prefilled syringe Inject 1,064 mg into the muscle once. Q 2 months     DULoxetine (CYMBALTA) 30 MG capsule TAKE 3 CAPSULES(90 MG) BY MOUTH DAILY 90 capsule 2   gabapentin (NEURONTIN) 400 MG capsule TAKE 1 CAPSULE(400 MG) BY MOUTH THREE TIMES DAILY 180 capsule 2   bictegravir-emtricitabine-tenofovir AF (BIKTARVY) 50-200-25 MG TABS tablet TAKE 1 TABLET BY MOUTH DAILY 30 tablet 0   oxyCODONE (OXY IR/ROXICODONE) 5 MG immediate release tablet Take 1 tablet (5 mg total) by mouth every 6 (six) hours as needed for severe pain. (Patient not taking: Reported on 03/14/2023) 30 tablet 0   No facility-administered medications prior to visit.     Allergies  Allergen Reactions   Haloperidol Other (See Comments)    Tardive dyskinesia    Social History   Tobacco Use   Smoking status: Never   Smokeless  tobacco: Never  Vaping Use   Vaping status: Never Used  Substance Use Topics   Alcohol use: Not Currently   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, Methamphetamines    Comment: last used  Jan 05 2023 ( 1 month ago per pt on 02-05-2023)    Social History   Substance and Sexual Activity  Sexual Activity Yes   Partners: Male   Comment: declined condoms   Social History   Substance and Sexual Activity  Sexual Activity Yes   Partners: Male   Comment:  declined condoms       Objective:   Vitals:   03/14/23 1425  BP: 126/75  Pulse: 84  Temp: 97.9 F (36.6 C)  TempSrc: Oral  SpO2: 98%  Weight: 184 lb (83.5 kg)   Body mass index is 32.59 kg/m.  Physical Exam Vitals reviewed.  Constitutional:      Appearance: He is well-developed.     Comments: Seated comfortably in chair during visit.   HENT:     Mouth/Throat:     Dentition: Normal dentition. No dental abscesses.  Cardiovascular:     Rate and Rhythm: Normal rate and regular rhythm.     Heart sounds: Normal heart sounds.  Pulmonary:     Effort: Pulmonary effort is normal.     Breath sounds: Normal breath sounds.  Abdominal:     General: There is no distension.     Palpations: Abdomen is soft.     Tenderness: There is no abdominal tenderness.  Lymphadenopathy:     Cervical: No cervical adenopathy.  Skin:    General: Skin is warm and dry.     Findings: No rash.  Neurological:     Mental Status: He is alert and oriented to person, place, and time.  Psychiatric:        Judgment: Judgment normal.     Comments: In good spirits today and engaged in care discussion.      Lab Results Lab Results  Component Value Date   WBC 10.6 (H) 12/08/2022   HGB 14.4 12/08/2022   HCT 41.9 12/08/2022   MCV 88.2 12/08/2022   PLT 294 12/08/2022    Lab Results  Component Value Date   CREATININE 0.92 12/08/2022   BUN 11 12/08/2022   NA 138 12/08/2022   K 3.9 12/08/2022   CL 102 12/08/2022   CO2 27 12/08/2022    Lab Results  Component Value Date   ALT 16 02/07/2022   AST 15 02/07/2022   ALKPHOS 67 11/12/2020   BILITOT 0.5 02/07/2022    Lab Results  Component Value Date   CHOL 155 07/23/2019   HDL 55 07/23/2019   LDLCALC 85 07/23/2019   TRIG 60 07/23/2019   CHOLHDL 2.8 07/23/2019   HIV 1 RNA Quant (Copies/mL)  Date Value  08/20/2022 Not Detected  02/07/2022 Not Detected  08/02/2021 <20 (H)   CD4 T Cell Abs (/uL)  Date Value  02/07/2022 635  08/02/2021 989   02/06/2021 595     Assessment & Plan:   Problem List Items Addressed This Visit       High   Asymptomatic HIV infection, CD4 >=500 (HCC)    Doing well and taking Biktarvy regularly without concern for adherence. Will defer labs today and have him return in 37m to recheck. Yearly lab assessment given his good adherence I would be comfortable with and he is in agreement.  In care with Dr. Rober Minion at [facility] at [facility]  Will check lipid panel for cardiac risk to see if benefit of adding statin for primary prevention of heart disease for PLWH.  Flu/covid boosters recommended in Fall around October.   Return in about 6 months (around 09/14/2023).       Relevant Medications   bictegravir-emtricitabine-tenofovir AF (BIKTARVY) 50-200-25 MG TABS tablet     Medium    Methamphetamine use disorder, moderate, in early remission [facility])    Doing well trying to stay clean. He still has cravings, completed rehab program. Nothing really helps the cravings medication wise, but behaviorally would like to pursue some CBT surrounding sexual practices to help break that trigger for him --> resources provided       Other Visit Diagnoses     LGSIL Pap smear of anus    -  Primary   Human immunodeficiency virus (HIV) disease (HCC)       Relevant Medications   bictegravir-emtricitabine-tenofovir AF (BIKTARVY) 50-200-25 MG TABS tablet   Other Relevant Orders   Hepatitis B surface antibody,qualitative   HIV 1 RNA quant-no reflex-bld   T-helper cells (CD4) count   COMPLETE METABOLIC PANEL WITH GFR   Lipid panel   Routine screening for STI (sexually transmitted infection)       Relevant Orders   RPR        Rexene Alberts, MSN, NP-C [facility]  [email or web address] Pager: [phone] Office: [phone] [facility] Main Line: [phone] *Secure Chat Communication Welcome   03/19/23  11:21 AM

## 2023-03-14 NOTE — Assessment & Plan Note (Addendum)
Doing well and taking Biktarvy regularly without concern for adherence. Will defer labs today and have him return in 29m to recheck. Yearly lab assessment given his good adherence I would be comfortable with and he is in agreement.  In care with Dr. Rober Minion at [facility] clinic at [facility] Will check lipid panel for cardiac risk to see if benefit of adding statin for primary prevention of heart disease for PLWH.  Flu/covid boosters recommended in Fall around October.   Return in about 6 months (around 09/14/2023).

## 2023-03-15 DIAGNOSIS — F1120 Opioid dependence, uncomplicated: Secondary | ICD-10-CM | POA: Diagnosis not present

## 2023-03-15 DIAGNOSIS — F1520 Other stimulant dependence, uncomplicated: Secondary | ICD-10-CM | POA: Diagnosis not present

## 2023-03-19 NOTE — Assessment & Plan Note (Addendum)
Doing well trying to stay clean. He still has cravings, completed rehab program. Nothing really helps the cravings medication wise, but behaviorally would like to pursue some CBT surrounding sexual practices to help break that trigger for him --> resources provided

## 2023-04-03 DIAGNOSIS — F1120 Opioid dependence, uncomplicated: Secondary | ICD-10-CM | POA: Diagnosis not present

## 2023-04-03 DIAGNOSIS — F1520 Other stimulant dependence, uncomplicated: Secondary | ICD-10-CM | POA: Diagnosis not present

## 2023-04-08 DIAGNOSIS — F1520 Other stimulant dependence, uncomplicated: Secondary | ICD-10-CM | POA: Diagnosis not present

## 2023-04-17 DIAGNOSIS — F1120 Opioid dependence, uncomplicated: Secondary | ICD-10-CM | POA: Diagnosis not present

## 2023-04-22 DIAGNOSIS — F1120 Opioid dependence, uncomplicated: Secondary | ICD-10-CM | POA: Diagnosis not present

## 2023-04-24 ENCOUNTER — Encounter: Payer: Self-pay | Admitting: Student

## 2023-04-24 ENCOUNTER — Ambulatory Visit: Payer: Medicare (Managed Care) | Admitting: Student

## 2023-04-24 VITALS — BP 108/65 | HR 84 | Temp 98.1°F | Ht 63.0 in | Wt 186.7 lb

## 2023-04-24 DIAGNOSIS — Z113 Encounter for screening for infections with a predominantly sexual mode of transmission: Secondary | ICD-10-CM

## 2023-04-24 DIAGNOSIS — M21372 Foot drop, left foot: Secondary | ICD-10-CM | POA: Insufficient documentation

## 2023-04-24 NOTE — Assessment & Plan Note (Signed)
Patient presents to the clinic today with acute concerns for the inability to dorsiflex his left foot.  He states he was walking on a trail about 1 week ago and he fell. He states that he landed on his left knee and he has since not been able to pick up his left foot. He states that the pain has resolved and his concern now is that it is affecting the way that he walks. He states that he has to pick up his left leg higher that his right leg to walk. He denies any sensory changes.  On exam, patient does have steppage gait.  Patient is able to distinguish between sharp and dull stimuli bilaterally.  2+ pedal pulses.  On exam, patient has 0/5 strength on dorsiflexion of left lower extremity, but has 5/5 strength noted on right dorsiflexion.  Patient has 4/5 strength noted to plantarflexion on left lower extremity, but has 5/5 strength noted to right lower extremity on plantarflexion.  Concern at this time is for foot drop, could be related to peroneal nerve injury.  Did palpate the fibular head, and patient had no bony tenderness, so less likely fracture, but I do wonder patient has entrapment of his common peroneal nerve.  This could also be related to mononeuritis.  Will examine further with MRI.  Plan: -MRI of left knee -Refer patient to physical therapy -Provided patient handout for foot drop exercises

## 2023-04-24 NOTE — Progress Notes (Signed)
CC: Foot drop  HPI:  [name] is a 42 y.o. male presents to the clinic with acute concerns of foot drop.  Please see assessment and plan for full HPI.  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 MRI brain and cervical spine showed bilateral neural foraminal cysts C4-C5, C5-C6, C6-C7, brain within normal limits.  - continue cymbalta, flexeril, neurontin - f/u with Dr. Debbra Riding [facility] neurology 03/28/19   Depression    History of syphilis 12/30/2017   RPR 1:1 12/25/17 s/p adequate treatment   HIV (human immunodeficiency virus infection) (HCC)    IV drug abuse (HCC)    TOS (thoracic outlet syndrome)      Current Outpatient Medications:    ARISTADA 1064 MG/3.9ML prefilled syringe, Inject 1,064 mg into the muscle once. Q 2 months, Disp: , Rfl:    bictegravir-emtricitabine-tenofovir AF (BIKTARVY) 50-200-25 MG TABS tablet, Take 1 tablet by mouth daily., Disp: 30 tablet, Rfl: 11   DULoxetine (CYMBALTA) 30 MG capsule, TAKE 3 CAPSULES(90 MG) BY MOUTH DAILY, Disp: 90 capsule, Rfl: 2   gabapentin (NEURONTIN) 400 MG capsule, TAKE 1 CAPSULE(400 MG) BY MOUTH THREE TIMES DAILY, Disp: 180 capsule, Rfl: 2   oxyCODONE (OXY IR/ROXICODONE) 5 MG immediate release tablet, Take 1 tablet (5 mg total) by mouth every 6 (six) hours as needed for severe pain. (Patient not taking: Reported on 03/14/2023), Disp: 30 tablet, Rfl: 0  Review of Systems:   MSK: Patient endorses left foot weakness   Physical Exam:  Vitals:   04/24/23 1504  BP: 108/65  Pulse: 84  Temp: 98.1 F (36.7 C)  TempSrc: Oral  SpO2: 98%  Weight: 186 lb 11.2 oz (84.7 kg)  Height: 5\' 3"  (1.6 m)   General: Patient is sitting comfortably in the room, steppage gait  Head: Normocephalic, atraumatic  MSK: 0/5 strength on dorsiflexion of left lower extremity, 5/5 strength noted on right  dorsiflexion. 4/5 strength noted to plantarflexion on left lower extremity, 5/5 strength noted to right lower extremity on plantarflexion. Able to distinguish between sharp and dull stimuli bilaterally to lower extremities. No bony tenderness appreciated at fibular head, patella or tibial plateau   Assessment & Plan:   Left foot drop Patient presents to the clinic today with acute concerns for the inability to dorsiflex his left foot.  He states he was walking on a trail about 1 week ago and he fell. He states that he landed on his left knee and he has since not been able to pick up his left foot. He states that the pain has resolved and his concern now is that it is affecting the way that he walks. He states that he has to pick up his left leg higher that his right leg to walk. He denies any sensory changes.  On exam, patient does have steppage gait.  Patient is able to distinguish between sharp and dull stimuli bilaterally.  2+ pedal pulses.  On exam, patient has 0/5 strength on dorsiflexion of left lower extremity, but has 5/5 strength noted on right dorsiflexion.  Patient has 4/5 strength noted to plantarflexion on left lower extremity, but has 5/5 strength noted to right lower extremity on plantarflexion.  Concern at this time is for foot drop, could be related to peroneal nerve injury.  Did palpate the fibular head, and patient had no bony  tenderness, so less likely fracture, but I do wonder patient has entrapment of his common peroneal nerve.  This could also be related to mononeuritis.  Will examine further with MRI.  Plan: -MRI of left knee -Refer patient to physical therapy -Provided patient handout for foot drop exercises  Patient discussed with Dr. Alphonsus Sias, DO PGY-2 Internal Medicine Resident  Pager: [phone]

## 2023-04-24 NOTE — Patient Instructions (Signed)
Kenneth Hunt,Thank you for allowing me to take part in your care today.  Here are your instructions.  1.  Regarding your knee injury, I am referring him for an MRI.  Please wait to schedule this appointment.  2.  In the meantime, please do physical therapy, as this will help you regain function in your foot.  3.  I will call you with results.  Thank you, Dr. Allena Katz  If you have any other questions please contact the internal medicine clinic at [phone] If it is after hours, please call the [place] hospital at [phone] and then ask the person who picks up for the resident on call.

## 2023-04-29 DIAGNOSIS — F1520 Other stimulant dependence, uncomplicated: Secondary | ICD-10-CM | POA: Diagnosis not present

## 2023-04-29 DIAGNOSIS — F1120 Opioid dependence, uncomplicated: Secondary | ICD-10-CM | POA: Diagnosis not present

## 2023-04-30 ENCOUNTER — Ambulatory Visit (HOSPITAL_COMMUNITY): Payer: Medicare (Managed Care)

## 2023-05-01 DIAGNOSIS — F1510 Other stimulant abuse, uncomplicated: Secondary | ICD-10-CM | POA: Diagnosis not present

## 2023-05-01 DIAGNOSIS — F3132 Bipolar disorder, current episode depressed, moderate: Secondary | ICD-10-CM | POA: Diagnosis not present

## 2023-05-07 NOTE — Addendum Note (Signed)
Addended by: Debe Coder B on: 05/07/2023 02:54 PM   Modules accepted: Level of Service

## 2023-05-07 NOTE — Progress Notes (Signed)
Internal Medicine Clinic Attending  Case discussed with the resident at the time of the visit.  We reviewed the resident's history and exam and pertinent patient test results.  I agree with the assessment, diagnosis, and plan of care documented in the resident's note.  MRI scheduled for 9/26  Debe Coder, MD

## 2023-05-08 ENCOUNTER — Ambulatory Visit (INDEPENDENT_AMBULATORY_CARE_PROVIDER_SITE_OTHER): Payer: Medicare (Managed Care) | Admitting: Student

## 2023-05-08 ENCOUNTER — Encounter: Payer: Self-pay | Admitting: Student

## 2023-05-08 VITALS — BP 111/59 | HR 81 | Temp 97.7°F | Ht 63.0 in | Wt 183.7 lb

## 2023-05-08 DIAGNOSIS — K529 Noninfective gastroenteritis and colitis, unspecified: Secondary | ICD-10-CM | POA: Diagnosis not present

## 2023-05-08 HISTORY — DX: Noninfective gastroenteritis and colitis, unspecified: K52.9

## 2023-05-08 NOTE — Assessment & Plan Note (Addendum)
Patient endorses nausea and vomiting as well as diarrhea.  He said this started on 05/07/2023.  He threw up 2 times on that day and has had several bouts of diarrhea.  He states the diarrhea is a light brown color.  He denies hematemesis hematochezia, coffee-ground emesis, or melena.  He endorses some abdominal cramping and pain which is intermittent.  He rates it as a 4 out of 10.  Denies fevers, chills, or night sweats.  Patient is HIV positive, compliant with his medications last CD4 count in the 400s.  He is enrolled in a drug treatment program which he attends on Wednesdays and Thursdays.  He has been unable to attend today and feels he is likely unable to attend tomorrow due to his symptoms.  Assessment: Symptoms consistent with viral GI illness versus food poisoning.  He states that he went to a public event on Sunday and feels like he may have picked something up and then.  No concerning finding for HIV related infection or bacterial gastroenteritis.  Plan: - Continue conservative management discussed with patient. - Return precautions given for worsening symptoms, failure to improve in the next few days, hematemesis, hematochezia, sudden inability to pass gas or stool, or new and alarming symptoms.  Patient demonstrated understanding. - Letter given for his program.

## 2023-05-08 NOTE — Progress Notes (Signed)
Subjective:  CC: Gastroenteritis  HPI:  [name] is a 42 y.o. male with a past medical history stated below and presents today for gastroenteritis. Please see problem based assessment and plan for additional details.  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 MRI brain and cervical spine showed bilateral neural foraminal cysts C4-C5, C5-C6, C6-C7, brain within normal limits.  - continue cymbalta, flexeril, neurontin - f/u with Dr. Debbra Riding [facility] neurology 03/28/19   Depression    History of syphilis 12/30/2017   RPR 1:1 12/25/17 s/p adequate treatment   HIV (human immunodeficiency virus infection) (HCC)    IV drug abuse (HCC)    TOS (thoracic outlet syndrome)     Current Outpatient Medications on File Prior to Visit  Medication Sig Dispense Refill   ARISTADA 1064 MG/3.9ML prefilled syringe Inject 1,064 mg into the muscle once. Q 2 months     bictegravir-emtricitabine-tenofovir AF (BIKTARVY) 50-200-25 MG TABS tablet Take 1 tablet by mouth daily. 30 tablet 11   DULoxetine (CYMBALTA) 30 MG capsule TAKE 3 CAPSULES(90 MG) BY MOUTH DAILY 90 capsule 2   gabapentin (NEURONTIN) 400 MG capsule TAKE 1 CAPSULE(400 MG) BY MOUTH THREE TIMES DAILY 180 capsule 2   oxyCODONE (OXY IR/ROXICODONE) 5 MG immediate release tablet Take 1 tablet (5 mg total) by mouth every 6 (six) hours as needed for severe pain. (Patient not taking: Reported on 03/14/2023) 30 tablet 0   No current facility-administered medications on file prior to visit.    Review of Systems: ROS negative except for what is noted on the assessment and plan.  Objective:   Vitals:   05/08/23 1048  BP: (!) 111/59  Pulse: 81  Temp: 97.7 F (36.5 C)  TempSrc: Oral  SpO2: 97%  Weight: 183 lb 11.2 oz (83.3 kg)  Height: 5\' 3"  (1.6 m)    Physical Exam: Constitutional: in no acute  distress Cardiovascular: regular rate and rhythm, no m/r/g Pulmonary/Chest: normal work of breathing on room air, lungs clear to auscultation bilaterally.  Abdominal: Positive bowel sounds, mild diffuse tenderness.  No right upper quadrant tenderness with the palpation.  No signs of jaundice.    Assessment & Plan:  Gastroenteritis Patient endorses nausea and vomiting as well as diarrhea.  He said this started on 05/07/2023.  He threw up 2 times on that day and has had several bouts of diarrhea.  He states the diarrhea is a light brown color.  He denies hematemesis hematochezia, coffee-ground emesis, or melena.  He endorses some abdominal cramping and pain which is intermittent.  He rates it as a 4 out of 10.  Denies fevers, chills, or night sweats.  Patient is HIV positive, compliant with his medications last CD4 count in the 400s.  He is enrolled in a drug treatment program which he attends on Wednesdays and Thursdays.  He has been unable to attend today and feels he is likely unable to attend tomorrow due to his symptoms.  Assessment: Symptoms consistent with viral GI illness versus food poisoning.  He states that he went to a public event on Sunday and feels like he may have picked something up and then.  No concerning finding for HIV related infection or bacterial gastroenteritis.  Plan: - Continue conservative management discussed with patient. - Return precautions given for worsening symptoms, failure to improve in the  next few days, hematemesis, hematochezia, sudden inability to pass gas or stool, or new and alarming symptoms.  Patient demonstrated understanding. - Letter given for his program.   Patient seen with Dr. Julian Reil MD [facility] Internal Medicine  PGY-1 Pager: [phone]  Phone: [phone] Date 05/08/2023  Time 11:42 AM

## 2023-05-08 NOTE — Patient Instructions (Signed)
Thank you, [name] for allowing [place] to provide your care today. Today we discussed GI bug.    If you fail to improve, have fever, worsening pain, blood in your stool or vomit, other concerning symptoms please reach out to [place] and seek urgent medical attention.   Follow up:  As needed    We look forward to seeing you next time. Please call our clinic at [phone] if you have any questions or concerns. The best time to call is Monday-Friday from 9am-4pm, but there is someone available 24/7. If after hours or the weekend, call the main hospital number and ask for the Internal Medicine Resident On-Call. If you need medication refills, please notify your pharmacy one week in advance and they will send [place] a request.   Thank you for trusting me with your care. Wishing you the best!  Lovie Macadamia MD [facility]

## 2023-05-09 ENCOUNTER — Ambulatory Visit (HOSPITAL_COMMUNITY): Payer: Medicare (Managed Care)

## 2023-05-13 NOTE — Addendum Note (Signed)
Addended by: Dickie La on: 05/13/2023 10:24 AM   Modules accepted: Level of Service

## 2023-05-13 NOTE — Progress Notes (Signed)
Internal Medicine Clinic Attending  I was physically present during the key portions of the resident provided service and participated in the medical decision making of patient's management care. I reviewed pertinent patient test results.  The assessment, diagnosis, and plan were formulated together and I agree with the documentation in the resident's note.  Lau, Grace, MD  

## 2023-05-15 DIAGNOSIS — F1520 Other stimulant dependence, uncomplicated: Secondary | ICD-10-CM | POA: Diagnosis not present

## 2023-05-22 DIAGNOSIS — F1520 Other stimulant dependence, uncomplicated: Secondary | ICD-10-CM | POA: Diagnosis not present

## 2023-05-29 DIAGNOSIS — F1520 Other stimulant dependence, uncomplicated: Secondary | ICD-10-CM | POA: Diagnosis not present

## 2023-06-03 ENCOUNTER — Encounter: Payer: Medicare (Managed Care) | Admitting: Student

## 2023-06-05 DIAGNOSIS — F1520 Other stimulant dependence, uncomplicated: Secondary | ICD-10-CM | POA: Diagnosis not present

## 2023-06-06 ENCOUNTER — Encounter: Payer: Medicare (Managed Care) | Admitting: Student

## 2023-06-12 DIAGNOSIS — F1520 Other stimulant dependence, uncomplicated: Secondary | ICD-10-CM | POA: Diagnosis not present

## 2023-06-13 DIAGNOSIS — F1520 Other stimulant dependence, uncomplicated: Secondary | ICD-10-CM | POA: Diagnosis not present

## 2023-07-03 DIAGNOSIS — F1510 Other stimulant abuse, uncomplicated: Secondary | ICD-10-CM | POA: Diagnosis not present

## 2023-07-03 DIAGNOSIS — F3132 Bipolar disorder, current episode depressed, moderate: Secondary | ICD-10-CM | POA: Diagnosis not present

## 2023-07-03 DIAGNOSIS — F1520 Other stimulant dependence, uncomplicated: Secondary | ICD-10-CM | POA: Diagnosis not present

## 2023-07-04 DIAGNOSIS — F1520 Other stimulant dependence, uncomplicated: Secondary | ICD-10-CM | POA: Diagnosis not present

## 2023-07-08 DIAGNOSIS — K6282 Dysplasia of anus: Secondary | ICD-10-CM | POA: Diagnosis not present

## 2023-07-10 DIAGNOSIS — F1520 Other stimulant dependence, uncomplicated: Secondary | ICD-10-CM | POA: Diagnosis not present

## 2023-07-17 DIAGNOSIS — F1520 Other stimulant dependence, uncomplicated: Secondary | ICD-10-CM | POA: Diagnosis not present

## 2023-07-18 DIAGNOSIS — F3132 Bipolar disorder, current episode depressed, moderate: Secondary | ICD-10-CM | POA: Diagnosis not present

## 2023-07-18 DIAGNOSIS — F1510 Other stimulant abuse, uncomplicated: Secondary | ICD-10-CM | POA: Diagnosis not present

## 2023-07-31 DIAGNOSIS — F1520 Other stimulant dependence, uncomplicated: Secondary | ICD-10-CM | POA: Diagnosis not present

## 2023-08-02 ENCOUNTER — Other Ambulatory Visit: Payer: Self-pay

## 2023-08-02 DIAGNOSIS — G540 Brachial plexus disorders: Secondary | ICD-10-CM

## 2023-08-02 MED ORDER — DULOXETINE HCL 30 MG PO CPEP
ORAL_CAPSULE | ORAL | 2 refills | Status: DC
Start: 2023-08-02 — End: 2023-08-27

## 2023-08-02 MED ORDER — GABAPENTIN 400 MG PO CAPS
ORAL_CAPSULE | ORAL | 2 refills | Status: DC
Start: 2023-08-02 — End: 2023-08-27

## 2023-08-27 ENCOUNTER — Ambulatory Visit: Payer: Medicare (Managed Care) | Admitting: Student

## 2023-08-27 VITALS — BP 124/81 | HR 102 | Temp 98.2°F | Ht 63.0 in | Wt 195.6 lb

## 2023-08-27 DIAGNOSIS — F331 Major depressive disorder, recurrent, moderate: Secondary | ICD-10-CM

## 2023-08-27 DIAGNOSIS — Z23 Encounter for immunization: Secondary | ICD-10-CM | POA: Diagnosis not present

## 2023-08-27 DIAGNOSIS — L219 Seborrheic dermatitis, unspecified: Secondary | ICD-10-CM | POA: Insufficient documentation

## 2023-08-27 DIAGNOSIS — Z0000 Encounter for general adult medical examination without abnormal findings: Secondary | ICD-10-CM

## 2023-08-27 DIAGNOSIS — G540 Brachial plexus disorders: Secondary | ICD-10-CM

## 2023-08-27 DIAGNOSIS — R635 Abnormal weight gain: Secondary | ICD-10-CM

## 2023-08-27 LAB — GLUCOSE, CAPILLARY: Glucose-Capillary: 114 mg/dL — ABNORMAL HIGH (ref 70–99)

## 2023-08-27 LAB — POCT GLYCOSYLATED HEMOGLOBIN (HGB A1C): Hemoglobin A1C: 5.6 % (ref 4.0–5.6)

## 2023-08-27 MED ORDER — DULOXETINE HCL 30 MG PO CPEP
ORAL_CAPSULE | ORAL | 2 refills | Status: DC
Start: 2023-08-27 — End: 2024-03-19

## 2023-08-27 MED ORDER — GABAPENTIN 400 MG PO CAPS: ORAL_CAPSULE | ORAL | 2 refills | Status: DC

## 2023-08-27 MED ORDER — GABAPENTIN 400 MG PO CAPS
ORAL_CAPSULE | ORAL | 2 refills | Status: DC
Start: 2023-08-27 — End: 2023-08-27

## 2023-08-27 MED ORDER — HYDROCORTISONE 1 % EX CREA
TOPICAL_CREAM | CUTANEOUS | 1 refills | Status: DC
Start: 2023-08-27 — End: 2023-12-10

## 2023-08-27 NOTE — Assessment & Plan Note (Signed)
 Patient taking duloxetine/Cymbalta 90 mg daily, provided refill today.

## 2023-08-27 NOTE — Assessment & Plan Note (Signed)
 Patient states he is concerned about recent weight gain. Weighed 183 lbs in September, 195 lb today. Discussed changes in metabolism with age, as well as lifestyle modifications he can try now. Discussed diet changes as well as increase in activity, such as walking. Hgb A1c today 5.6% (Hgb A1c 5.6% 08/20/2022). Will get lipid panel at [facility] 2/13 appointment. Patient agreeable to nutrition/dietician consult.  - Placed RD/nutrition consult today  - 4 month follow up in clinic, can discuss weight loss clinic/medication support for weight loss at that time

## 2023-08-27 NOTE — Progress Notes (Signed)
 Established Patient Office Visit  Subjective   Patient ID: Kenneth Hunt, male    DOB: August 08, 1981  Age: 43 y.o. MRN: [id number]  Chief Complaint  Patient presents with   Referral   Medication Refill    Kenneth Hunt is a 43 yo patient with a history listed below who is here for regular follow up of chronic medical conditions, medication refills, and discussion regarding weight gain. Patient continues to follow with [facility], next appointment Feb 13th, 2025. Please see problem-based assessment and plan for additional details.     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 MRI brain and cervical spine showed bilateral neural foraminal cysts C4-C5, C5-C6, C6-C7, brain within normal limits.  - continue cymbalta , flexeril, neurontin  - f/u with Dr. Genaro [facility] neurology 03/28/19   Depression    History of syphilis 12/30/2017   RPR 1:1 12/25/17 s/p adequate treatment   HIV (human immunodeficiency virus infection) (HCC)    IV drug abuse (HCC)    TOS (thoracic outlet syndrome)    Review of Systems  Constitutional:        Weight gain (12 lb) in 6 months      Objective:     BP 124/81 (BP Location: Left Arm, Patient Position: Sitting, Cuff Size: Small)   Pulse (!) 102   Temp 98.2 F (36.8 C) (Oral)   Ht 5' 3 (1.6 m)   Wt 195 lb 9.6 oz (88.7 kg)   SpO2 98%   BMI 34.65 kg/m  BP Readings from Last 3 Encounters:  08/27/23 124/81  05/08/23 (!) 111/59  04/24/23 108/65   Wt Readings from Last 3 Encounters:  08/27/23 195 lb 9.6 oz (88.7 kg)  05/08/23 183 lb 11.2 oz (83.3 kg)  04/24/23 186 lb 11.2 oz (84.7 kg)    Physical Exam Constitutional:      Appearance: Normal appearance.  HENT:     Head: Normocephalic and atraumatic.     Nose: Nose normal.  Eyes:     Extraocular Movements: Extraocular movements intact.     Pupils: Pupils  are equal, round, and reactive to light.  Pulmonary:     Effort: Pulmonary effort is normal.  Musculoskeletal:     Comments: Patient moving all limbs spontaneously.   Neurological:     General: No focal deficit present.     Mental Status: He is alert.  Psychiatric:        Mood and Affect: Mood normal.        Behavior: Behavior normal.     Results for orders placed or performed in visit on 08/27/23  Glucose, capillary  Result Value Ref Range   Glucose-Capillary 114 (H) 70 - 99 mg/dL  POC Hbg J8R  Result Value Ref Range   Hemoglobin A1C 5.6 4.0 - 5.6 %   HbA1c POC (<> result, manual entry)     HbA1c, POC (prediabetic range)     HbA1c, POC (controlled diabetic range)       The 10-year ASCVD risk score (Arnett DK, et al., 2019) is: 0.9%    Assessment & Plan:   Problem List Items Addressed This Visit     Brachial plexus disorders   Patient taking duloxetine /Cymbalta  90 mg daily, provided refill today.       Relevant Medications   DULoxetine  (CYMBALTA ) 30 MG capsule   gabapentin  (NEURONTIN )  400 MG capsule   Moderate recurrent major depression (HCC)   Patient taking duloxetine /Cymbalta  90 mg daily, provided refill today. Patient also taking Aristada IM injection every 2 months, going well. Tolerating well. No concerns today. Follows with Peggye Soja, NP at [facility].       Relevant Medications   DULoxetine  (CYMBALTA ) 30 MG capsule   gabapentin  (NEURONTIN ) 400 MG capsule   Thoracic outlet syndrome   Patient taking duloxetine /Cymbalta  90 mg daily, provided refill today.       Relevant Medications   DULoxetine  (CYMBALTA ) 30 MG capsule   Healthcare maintenance   Patient agreeable to flu vaccine today. Discussed need for colon cancer screening at age 43 and options including colonoscopy vs cologuard. Patient will think about options. Will follow up possible GI/colonoscopy referral at 4 month follow up visit.       Seborrheic dermatitis - Primary   Patient  describes having a facial rash that sometimes flares up. No current flare. He described it as red/inflamed skin often around nasolabial folds and possibly around forehead/eyebrows c/w seborrheic dermatitis. Discussed OTC use of selsun blue or head and shoulders 1x week for flares combined with topical steroid cream for up to 2 weeks for symptomatic improvement. Patient will call if treatment plan does not help with flares and/or if there is worsening in flares as he may need further evaluation.        Weight gain   Patient states he is concerned about recent weight gain. Weighed 183 lbs in September, 195 lb today. Discussed changes in metabolism with age, as well as lifestyle modifications he can try now. Discussed diet changes as well as increase in activity, such as walking. Hgb A1c today 5.6% (Hgb A1c 5.6% 08/20/2022). Will get lipid panel at [facility] 2/13 appointment. Patient agreeable to nutrition/dietician consult.  - Placed RD/nutrition consult today  - 4 month follow up in clinic, can discuss weight loss clinic/medication support for weight loss at that time      Relevant Orders   POC Hbg A1C (Completed)   Amb ref to Medical Nutrition Therapy-MNT   Other Visit Diagnoses       Encounter for immunization       Relevant Orders   Flu vaccine trivalent PF, 6mos and older(Flulaval,Afluria,Fluarix,Fluzone) (Completed)       Return in about 4 months (around 12/25/2023) for Health maintenance, weight gain.   Patient discussed with Dr. Guilloud.  Gerardo Caiazzo Arellano Zameza, MD

## 2023-08-27 NOTE — Assessment & Plan Note (Signed)
 Patient taking duloxetine/Cymbalta 90 mg daily, provided refill today. Patient also taking Aristada IM injection every 2 months, going well. Tolerating well. No concerns today. Follows with Armandina Gemma, NP at [facility].

## 2023-08-27 NOTE — Assessment & Plan Note (Addendum)
 Patient describes having a facial rash that sometimes flares up. No current flare. He described it as red/inflamed skin often around nasolabial folds and possibly around forehead/eyebrows c/w seborrheic dermatitis. Discussed OTC use of selsun blue or head and shoulders 1x week for flares combined with topical steroid cream for up to 2 weeks for symptomatic improvement. Patient will call if treatment plan does not help with flares and/or if there is worsening in flares as he may need further evaluation.

## 2023-08-27 NOTE — Patient Instructions (Signed)
 Thank you, [name] for allowing us  to provide your care today. Today we discussed medication refills, healthcare maintenance, facial rash, and weight gain.  I have ordered the following labs for you:  Lab Orders         Glucose, capillary         POC Hbg A1C      Tests ordered today:  - None  Referrals ordered today:   Referral Orders         Amb ref to Medical Nutrition Therapy-MNT       I have ordered the following medication/changed the following medications:   Stop the following medications: Medications Discontinued During This Encounter  Medication Reason   DULoxetine  (CYMBALTA ) 30 MG capsule Reorder   gabapentin  (NEURONTIN ) 400 MG capsule Reorder     Start the following medications: Meds ordered this encounter  Medications   gabapentin  (NEURONTIN ) 400 MG capsule    Sig: TAKE 1 CAPSULE(400 MG) BY MOUTH THREE TIMES DAILY    Dispense:  180 capsule    Refill:  2   DULoxetine  (CYMBALTA ) 30 MG capsule    Sig: TAKE 3 CAPSULES(90 MG) BY MOUTH DAILY    Dispense:  90 capsule    Refill:  2   hydrocortisone  cream 1 %    Sig: For flares: Apply to affected area 2 times daily until you see improvement in redness/rash, use for up to 14 days at a time then give your skin a break    Dispense:  30 g    Refill:  1     Follow up: 3-4 months (Around April)    Remember:   - I will call you with the results of your point of care hgb A1c test - For seborrheic dermatitis flares: Create a foam with water  and either selsun blue shampoo or head and shoulders shampoo (anti-dandruff) and apply to your face as a face wash in the shower 1/week. Apply a pea size amount of hydrocortisone  to the affected areas on your face (thin, even application) up to twice a day until you see improvement. Please do not use for more than 2 weeks at a time. Avoid getting into your eyes.  - A nutritional consult has been placed, you will get a call to follow up - Your refills for cymbalta  and  gabapentin  have been sent to your preferred pharmacy  - Please continue to follow up with [facility] - We will see you around April to discuss health maintenance including possible colonoscopy referral   Should you have any questions or concerns please call the internal medicine clinic at [phone].     Kenneth Enneking Arellano Zameza, MD PGY-1 Internal Medicine Teaching Progam [facility]

## 2023-08-27 NOTE — Assessment & Plan Note (Signed)
 Patient agreeable to flu vaccine today. Discussed need for colon cancer screening at age 43 and options including colonoscopy vs cologuard. Patient will think about options. Will follow up possible GI/colonoscopy referral at 4 month follow up visit.

## 2023-08-30 NOTE — Progress Notes (Signed)
 Internal Medicine Clinic Attending  Case discussed with the resident at the time of the visit.  We reviewed the resident's history and exam and pertinent patient test results.  I agree with the assessment, diagnosis, and plan of care documented in the resident's note.

## 2023-08-30 NOTE — Addendum Note (Signed)
Addended by: Burnell Blanks on: 08/30/2023 07:38 AM   Modules accepted: Level of Service

## 2023-09-26 ENCOUNTER — Other Ambulatory Visit: Payer: Self-pay

## 2023-09-26 ENCOUNTER — Encounter: Payer: Self-pay | Admitting: Infectious Diseases

## 2023-09-26 ENCOUNTER — Ambulatory Visit: Payer: Medicare (Managed Care) | Admitting: Infectious Diseases

## 2023-09-26 DIAGNOSIS — B20 Human immunodeficiency virus [HIV] disease: Secondary | ICD-10-CM

## 2023-09-26 DIAGNOSIS — Z113 Encounter for screening for infections with a predominantly sexual mode of transmission: Secondary | ICD-10-CM

## 2023-09-26 NOTE — Patient Instructions (Addendum)
REPRIEVE Trial - 35% reduction in heart disease risk with adding a once a day cholesterol medication   I will send you an update of your risk score after we get your lipid screening back.   Cologuard is the less invasive option for colon cancer screening

## 2023-09-26 NOTE — Progress Notes (Signed)
Name: Kenneth Hunt  DOB: Aug 02, 1981 MRN: [id number] PCP: Philomena Doheny, MD    Brief Narrative:  [name] is a 43 y.o. male with well controlled HIV.  Transferred care from [facility] in [place]  [phone]) HIV Risk: MSM History of OIs: none known  Intake Labs 07/23/2019: Hep B sAg (-), sAb (-), cAb (-); Hep A (immune), Hep C (-) Quantiferon (-) HLA B*5701 (-) G6PD: () Toxo IgG: (-)   Previous Regimens: Biktarvy   Genotypes: None on record   Subjective   Subjective:   Chief Complaint  Patient presents with   Follow-up      Discussed the use of AI scribe software for clinical note transcription with the patient, who gave verbal consent to proceed.  History of Present Illness   Kenneth Hunt is a 43 year old male with HIV who presents for routine follow-up and blood work.  He is currently on Biktarvy for HIV management and has experienced a four-day lapse in taking it. He has not had blood work in about a year and is here to have it repeated, including a CD4 count and viral load.  He has experienced a weight gain of approximately 10 pounds over the past year, with his current weight at 195 pounds. He recalls being in the 160s or 170s several years ago and was 184 pounds in August. He does not feel the weight gain is noticeable when looking at himself and describes his body type as 'stocky.' No tightness in his clothing, and he attributes some of the weight to muscle mass. He consumes a lot of coffee with sugar and creamer, which he acknowledges may contribute to his weight gain. He does not feel he overeats and mentions walking but not extensively exercising.  He has a history of a fistula that developed several months ago, which was repaired by Dr. Maisie Fus in [facility]. During the repair, some warts were also removed. He is under the care of both Dr. Maisie Fus and Dr. Maurice March for follow-up, with a visit to  Dr. Maurice March scheduled next month.  His A1c level was 5.6, which he understands to be at the upper end of normal. He is not concerned about diabetes but is aware of the concept of insulin resistance.  He is over 40 and discussed colon cancer screening options. The difference between colonoscopy and Cologuard was explained, emphasizing that Cologuard is less invasive but may require follow-up colonoscopy if positive.        Review of Systems  Constitutional:  Negative for appetite change, chills, fatigue, fever and unexpected weight change.  Eyes:  Negative for visual disturbance.  Respiratory:  Negative for cough and shortness of breath.   Cardiovascular:  Negative for chest pain and leg swelling.  Gastrointestinal:  Negative for abdominal pain, diarrhea and nausea.  Genitourinary:  Negative for dysuria, genital sores and penile discharge.  Musculoskeletal:  Negative for joint swelling.  Skin:  Negative for color change and rash.  Neurological:  Negative for dizziness and headaches.  Hematological:  Negative for adenopathy.  Psychiatric/Behavioral:  Negative for sleep disturbance. The patient is not nervous/anxious.      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 MRI brain and cervical spine showed bilateral neural foraminal cysts C4-C5, C5-C6, C6-C7, brain within normal limits.  - continue cymbalta, flexeril, neurontin -  f/u with Dr. Debbra Riding [facility] neurology 03/28/19   Depression    History of syphilis 12/30/2017   RPR 1:1 12/25/17 s/p adequate treatment   HIV (human immunodeficiency virus infection) (HCC)    IV drug abuse (HCC)    TOS (thoracic outlet syndrome)     Outpatient Medications Prior to Visit  Medication Sig Dispense Refill   ARISTADA 1064 MG/3.9ML prefilled syringe Inject 1,064 mg into the muscle once. Q 2 months      bictegravir-emtricitabine-tenofovir AF (BIKTARVY) 50-200-25 MG TABS tablet Take 1 tablet by mouth daily. 30 tablet 11   DULoxetine (CYMBALTA) 30 MG capsule TAKE 3 CAPSULES(90 MG) BY MOUTH DAILY 90 capsule 2   gabapentin (NEURONTIN) 400 MG capsule TAKE 1 CAPSULE(400 MG) BY MOUTH THREE TIMES DAILY 180 capsule 2   hydrocortisone cream 1 % For flares: Apply to affected area 2 times daily until you see improvement in redness/rash, use for up to 14 days at a time then give your skin a break (Patient not taking: Reported on 09/26/2023) 30 g 1   oxyCODONE (OXY IR/ROXICODONE) 5 MG immediate release tablet Take 1 tablet (5 mg total) by mouth every 6 (six) hours as needed for severe pain. (Patient not taking: Reported on 09/26/2023) 30 tablet 0   No facility-administered medications prior to visit.     Allergies  Allergen Reactions   Haloperidol Other (See Comments)    Tardive dyskinesia    Social History   Tobacco Use   Smoking status: Never   Smokeless tobacco: Never  Vaping Use   Vaping status: Never Used  Substance Use Topics   Alcohol use: Not Currently   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, Methamphetamines    Comment: last used  Jan 05 2023 ( 1 month ago per pt on 02-05-2023)    Social History   Substance and Sexual Activity  Sexual Activity Yes   Partners: Male   Comment: declined condoms   Social History   Substance and Sexual Activity  Sexual Activity Yes   Partners: Male   Comment: declined condoms      Objective    Objective:   Vitals:   09/26/23 1412  BP: 111/76  Pulse: 89  Temp: 97.6 F (36.4 C)  TempSrc: Oral  SpO2: 96%  Weight: 195 lb (88.5 kg)  Height: 5\' 3"  (1.6 m)   Body mass index is 34.54 kg/m.  Physical Exam Vitals reviewed.  Constitutional:      Appearance: He is well-developed.     Comments: Seated comfortably in chair during visit.   HENT:     Mouth/Throat:     Dentition: Normal dentition. No dental abscesses.  Cardiovascular:      Rate and Rhythm: Normal rate and regular rhythm.     Heart sounds: Normal heart sounds.  Pulmonary:     Effort: Pulmonary effort is normal.     Breath sounds: Normal breath sounds.  Abdominal:     General: There is no distension.     Palpations: Abdomen is soft.     Tenderness: There is no abdominal tenderness.  Lymphadenopathy:     Cervical: No cervical adenopathy.  Skin:    General: Skin is warm and dry.     Findings: No rash.  Neurological:     Mental Status: He is alert and oriented to person, place, and time.  Psychiatric:        Judgment: Judgment normal.     Comments: In good spirits today and  engaged in care discussion.      Lab Results Lab Results  Component Value Date   WBC 10.6 (H) 12/08/2022   HGB 14.4 12/08/2022   HCT 41.9 12/08/2022   MCV 88.2 12/08/2022   PLT 294 12/08/2022    Lab Results  Component Value Date   CREATININE 0.92 12/08/2022   BUN 11 12/08/2022   NA 138 12/08/2022   K 3.9 12/08/2022   CL 102 12/08/2022   CO2 27 12/08/2022    Lab Results  Component Value Date   ALT 16 02/07/2022   AST 15 02/07/2022   ALKPHOS 67 11/12/2020   BILITOT 0.5 02/07/2022    Lab Results  Component Value Date   CHOL 155 07/23/2019   HDL 55 07/23/2019   LDLCALC 85 07/23/2019   TRIG 60 07/23/2019   CHOLHDL 2.8 07/23/2019   HIV 1 RNA Quant (Copies/mL)  Date Value  08/20/2022 Not Detected  02/07/2022 Not Detected  08/02/2021 <20 (H)   CD4 T Cell Abs (/uL)  Date Value  02/07/2022 635  08/02/2021 989  02/06/2021 595      Assessment & Plan:     Weight Gain - 10 pound weight gain over the past year. Discussed the difference between subcutaneous and visceral fat. Patient does not report any changes in clothing fit. Discussed the potential contribution of sugar in coffee to weight gain. A1C is 5.6%. Declined fasting insulin today to gauge insulin resistance.  -Consider reducing sugar intake in coffee as a first step towards weight management.   HIV,  Well controlled - On Biktarvy with good adherence. Last A1C was 5.6, at the upper end of normal. Discussed the potential for insulin resistance despite normal A1C. -Continue Biktarvy as prescribed. -Check fasting insulin level, CD4 count, and viral load. -Consider adding a statin for cardiovascular disease prevention based on the REPRIEVE trial findings, pending cholesterol screening results.  Anal Warts - History of anal warts and fistula. Underwent treatment with Dr. Maisie Fus. -Continue follow-up with Dr. Maisie Fus for potential further biopsies or ablations.  Colon Cancer Screening - Discussed options for colon cancer screening given new guidelines recommending screening starting at age 71. -Discuss with internal medicine team about the possibility of using Cologuard for colon cancer screening.      No orders of the defined types were placed in this encounter.  No orders of the defined types were placed in this encounter.  Return in about 6 months (around 03/25/2024).   Rexene Alberts, MSN, NP-C [facility]ious [facility]  [email or web address] Pager: [phone] Office: [phone] [facility] Main Line: [phone] *Secure Chat Communication Welcome   09/26/23  3:51 PM

## 2023-09-29 LAB — COMPLETE METABOLIC PANEL WITH GFR
AG Ratio: 1.7 (calc) (ref 1.0–2.5)
ALT: 31 U/L (ref 9–46)
AST: 26 U/L (ref 10–40)
Albumin: 4.5 g/dL (ref 3.6–5.1)
Alkaline phosphatase (APISO): 60 U/L (ref 36–130)
BUN: 9 mg/dL (ref 7–25)
CO2: 22 mmol/L (ref 20–32)
Calcium: 9.8 mg/dL (ref 8.6–10.3)
Chloride: 103 mmol/L (ref 98–110)
Creat: 1.29 mg/dL (ref 0.60–1.29)
Globulin: 2.7 g/dL (ref 1.9–3.7)
Glucose, Bld: 91 mg/dL (ref 65–99)
Potassium: 4 mmol/L (ref 3.5–5.3)
Sodium: 140 mmol/L (ref 135–146)
Total Bilirubin: 0.5 mg/dL (ref 0.2–1.2)
Total Protein: 7.2 g/dL (ref 6.1–8.1)
eGFR: 70 mL/min/{1.73_m2} (ref >=60)

## 2023-09-29 LAB — T-HELPER CELLS (CD4) COUNT (NOT AT ARMC)
Absolute CD4: 824 {cells}/uL (ref 490–1740)
CD4 T Helper %: 32 % (ref 30–61)
Total lymphocyte count: 2555 {cells}/uL (ref 850–3900)

## 2023-09-29 LAB — LIPID PANEL
Cholesterol: 190 mg/dL (ref <200)
HDL: 71 mg/dL (ref >=40)
LDL Cholesterol (Calc): 98 mg/dL
Non-HDL Cholesterol (Calc): 119 mg/dL (ref <130)
Total CHOL/HDL Ratio: 2.7 (calc) (ref <5.0)
Triglycerides: 111 mg/dL (ref <150)

## 2023-09-29 LAB — HIV-1 RNA QUANT-NO REFLEX-BLD
HIV 1 RNA Quant: NOT DETECTED {copies}/mL
HIV-1 RNA Quant, Log: NOT DETECTED {Log}

## 2023-09-29 LAB — HEPATITIS B SURFACE ANTIBODY,QUALITATIVE: Hep B S Ab: REACTIVE — AB

## 2023-09-29 LAB — RPR: RPR Ser Ql: NONREACTIVE

## 2023-11-14 DIAGNOSIS — F1510 Other stimulant abuse, uncomplicated: Secondary | ICD-10-CM | POA: Diagnosis not present

## 2023-11-14 DIAGNOSIS — F3132 Bipolar disorder, current episode depressed, moderate: Secondary | ICD-10-CM | POA: Diagnosis not present

## 2023-11-24 ENCOUNTER — Emergency Department (HOSPITAL_COMMUNITY): Payer: Medicare (Managed Care)

## 2023-11-24 ENCOUNTER — Encounter (HOSPITAL_COMMUNITY): Payer: Medicare (Managed Care)

## 2023-11-24 ENCOUNTER — Other Ambulatory Visit: Payer: Self-pay

## 2023-11-24 ENCOUNTER — Inpatient Hospital Stay (HOSPITAL_COMMUNITY)
Admission: EM | Admit: 2023-11-24 | Discharge: 2023-12-10 | DRG: 917 | Disposition: A | Discharge status: Home / Self Care | Payer: Medicare (Managed Care) | Attending: Internal Medicine | Admitting: Internal Medicine

## 2023-11-24 ENCOUNTER — Inpatient Hospital Stay (HOSPITAL_COMMUNITY): Payer: Medicare (Managed Care)

## 2023-11-24 DIAGNOSIS — F331 Major depressive disorder, recurrent, moderate: Secondary | ICD-10-CM | POA: Diagnosis present

## 2023-11-24 DIAGNOSIS — R531 Weakness: Secondary | ICD-10-CM

## 2023-11-24 DIAGNOSIS — R1032 Left lower quadrant pain: Secondary | ICD-10-CM | POA: Diagnosis not present

## 2023-11-24 DIAGNOSIS — R1031 Right lower quadrant pain: Secondary | ICD-10-CM | POA: Diagnosis not present

## 2023-11-24 DIAGNOSIS — R9431 Abnormal electrocardiogram [ECG] [EKG]: Secondary | ICD-10-CM | POA: Diagnosis not present

## 2023-11-24 DIAGNOSIS — E871 Hypo-osmolality and hyponatremia: Secondary | ICD-10-CM | POA: Diagnosis present

## 2023-11-24 DIAGNOSIS — E872 Acidosis, unspecified: Secondary | ICD-10-CM | POA: Diagnosis not present

## 2023-11-24 DIAGNOSIS — J9601 Acute respiratory failure with hypoxia: Secondary | ICD-10-CM | POA: Diagnosis not present

## 2023-11-24 DIAGNOSIS — F15229 Other stimulant dependence with intoxication, unspecified: Secondary | ICD-10-CM | POA: Diagnosis not present

## 2023-11-24 DIAGNOSIS — R7989 Other specified abnormal findings of blood chemistry: Secondary | ICD-10-CM

## 2023-11-24 DIAGNOSIS — B20 Human immunodeficiency virus [HIV] disease: Secondary | ICD-10-CM | POA: Diagnosis not present

## 2023-11-24 DIAGNOSIS — M7989 Other specified soft tissue disorders: Secondary | ICD-10-CM

## 2023-11-24 DIAGNOSIS — D72829 Elevated white blood cell count, unspecified: Secondary | ICD-10-CM | POA: Diagnosis present

## 2023-11-24 DIAGNOSIS — Z7901 Long term (current) use of anticoagulants: Secondary | ICD-10-CM

## 2023-11-24 DIAGNOSIS — S8411XA Injury of peroneal nerve at lower leg level, right leg, initial encounter: Secondary | ICD-10-CM | POA: Diagnosis not present

## 2023-11-24 DIAGNOSIS — M21371 Foot drop, right foot: Secondary | ICD-10-CM | POA: Diagnosis present

## 2023-11-24 DIAGNOSIS — K5900 Constipation, unspecified: Secondary | ICD-10-CM | POA: Diagnosis not present

## 2023-11-24 DIAGNOSIS — E875 Hyperkalemia: Secondary | ICD-10-CM | POA: Diagnosis not present

## 2023-11-24 DIAGNOSIS — G5731 Lesion of lateral popliteal nerve, right lower limb: Secondary | ICD-10-CM | POA: Diagnosis present

## 2023-11-24 DIAGNOSIS — R109 Unspecified abdominal pain: Secondary | ICD-10-CM | POA: Diagnosis not present

## 2023-11-24 DIAGNOSIS — I82409 Acute embolism and thrombosis of unspecified deep veins of unspecified lower extremity: Secondary | ICD-10-CM | POA: Diagnosis present

## 2023-11-24 DIAGNOSIS — E878 Other disorders of electrolyte and fluid balance, not elsewhere classified: Secondary | ICD-10-CM | POA: Diagnosis present

## 2023-11-24 DIAGNOSIS — F1990 Other psychoactive substance use, unspecified, uncomplicated: Secondary | ICD-10-CM | POA: Diagnosis present

## 2023-11-24 DIAGNOSIS — F1520 Other stimulant dependence, uncomplicated: Secondary | ICD-10-CM | POA: Diagnosis not present

## 2023-11-24 DIAGNOSIS — R7401 Elevation of levels of liver transaminase levels: Secondary | ICD-10-CM | POA: Diagnosis present

## 2023-11-24 DIAGNOSIS — G540 Brachial plexus disorders: Secondary | ICD-10-CM | POA: Diagnosis present

## 2023-11-24 DIAGNOSIS — R600 Localized edema: Secondary | ICD-10-CM | POA: Diagnosis not present

## 2023-11-24 DIAGNOSIS — I82403 Acute embolism and thrombosis of unspecified deep veins of lower extremity, bilateral: Secondary | ICD-10-CM

## 2023-11-24 DIAGNOSIS — Z21 Asymptomatic human immunodeficiency virus [HIV] infection status: Secondary | ICD-10-CM | POA: Diagnosis present

## 2023-11-24 DIAGNOSIS — I10 Essential (primary) hypertension: Secondary | ICD-10-CM | POA: Diagnosis not present

## 2023-11-24 DIAGNOSIS — X58XXXA Exposure to other specified factors, initial encounter: Secondary | ICD-10-CM | POA: Diagnosis present

## 2023-11-24 DIAGNOSIS — E8779 Other fluid overload: Secondary | ICD-10-CM | POA: Diagnosis not present

## 2023-11-24 DIAGNOSIS — M6282 Rhabdomyolysis: Secondary | ICD-10-CM | POA: Diagnosis present

## 2023-11-24 DIAGNOSIS — F419 Anxiety disorder, unspecified: Secondary | ICD-10-CM | POA: Diagnosis present

## 2023-11-24 DIAGNOSIS — I82453 Acute embolism and thrombosis of peroneal vein, bilateral: Secondary | ICD-10-CM | POA: Diagnosis present

## 2023-11-24 DIAGNOSIS — F1521 Other stimulant dependence, in remission: Secondary | ICD-10-CM | POA: Diagnosis present

## 2023-11-24 DIAGNOSIS — E8770 Fluid overload, unspecified: Secondary | ICD-10-CM | POA: Diagnosis present

## 2023-11-24 DIAGNOSIS — T43651A Poisoning by methamphetamines accidental (unintentional), initial encounter: Principal | ICD-10-CM | POA: Diagnosis present

## 2023-11-24 DIAGNOSIS — E8721 Acute metabolic acidosis: Secondary | ICD-10-CM | POA: Diagnosis not present

## 2023-11-24 DIAGNOSIS — F3181 Bipolar II disorder: Secondary | ICD-10-CM | POA: Diagnosis present

## 2023-11-24 DIAGNOSIS — I82443 Acute embolism and thrombosis of tibial vein, bilateral: Secondary | ICD-10-CM | POA: Diagnosis present

## 2023-11-24 DIAGNOSIS — R000 Tachycardia, unspecified: Secondary | ICD-10-CM | POA: Diagnosis not present

## 2023-11-24 DIAGNOSIS — L2489 Irritant contact dermatitis due to other agents: Secondary | ICD-10-CM | POA: Diagnosis not present

## 2023-11-24 DIAGNOSIS — N179 Acute kidney failure, unspecified: Secondary | ICD-10-CM | POA: Diagnosis not present

## 2023-11-24 DIAGNOSIS — I824Z3 Acute embolism and thrombosis of unspecified deep veins of distal lower extremity, bilateral: Secondary | ICD-10-CM

## 2023-11-24 DIAGNOSIS — R609 Edema, unspecified: Secondary | ICD-10-CM | POA: Diagnosis not present

## 2023-11-24 DIAGNOSIS — F1590 Other stimulant use, unspecified, uncomplicated: Secondary | ICD-10-CM | POA: Diagnosis not present

## 2023-11-24 DIAGNOSIS — I824Z2 Acute embolism and thrombosis of unspecified deep veins of left distal lower extremity: Secondary | ICD-10-CM | POA: Diagnosis not present

## 2023-11-24 DIAGNOSIS — Z5181 Encounter for therapeutic drug level monitoring: Secondary | ICD-10-CM

## 2023-11-24 LAB — RENAL FUNCTION PANEL
Albumin: 2.5 g/dL — ABNORMAL LOW (ref 3.5–5.0)
Anion gap: 23 — ABNORMAL HIGH (ref 5–15)
BUN: 128 mg/dL — ABNORMAL HIGH (ref 6–20)
CO2: 11 mmol/L — ABNORMAL LOW (ref 22–32)
Calcium: 6.2 mg/dL — CL (ref 8.9–10.3)
Chloride: 91 mmol/L — ABNORMAL LOW (ref 98–111)
Creatinine, Ser: 10.95 mg/dL — ABNORMAL HIGH (ref 0.61–1.24)
GFR, Estimated: 5 mL/min — ABNORMAL LOW (ref >60)
Glucose, Bld: 91 mg/dL (ref 70–99)
Phosphorus: 9.2 mg/dL — ABNORMAL HIGH (ref 2.5–4.6)
Potassium: 5.6 mmol/L — ABNORMAL HIGH (ref 3.5–5.1)
Sodium: 125 mmol/L — ABNORMAL LOW (ref 135–145)

## 2023-11-24 LAB — I-STAT CHEM 8, ED
BUN: >130 mg/dL — ABNORMAL HIGH (ref 6–20)
Calcium, Ion: 0.71 mmol/L — CL (ref 1.15–1.40)
Chloride: 93 mmol/L — ABNORMAL LOW (ref 98–111)
Creatinine, Ser: 11.7 mg/dL — ABNORMAL HIGH (ref 0.61–1.24)
Glucose, Bld: 89 mg/dL (ref 70–99)
HCT: 44 % (ref 39.0–52.0)
Hemoglobin: 15 g/dL (ref 13.0–17.0)
Potassium: 6.1 mmol/L — ABNORMAL HIGH (ref 3.5–5.1)
Sodium: 119 mmol/L — CL (ref 135–145)
TCO2: 16 mmol/L — ABNORMAL LOW (ref 22–32)

## 2023-11-24 LAB — COMPREHENSIVE METABOLIC PANEL WITH GFR
ALT: 688 U/L — ABNORMAL HIGH (ref 0–44)
AST: 1578 U/L — ABNORMAL HIGH (ref 15–41)
Albumin: 3.1 g/dL — ABNORMAL LOW (ref 3.5–5.0)
Alkaline Phosphatase: 63 U/L (ref 38–126)
Anion gap: 24 — ABNORMAL HIGH (ref 5–15)
BUN: 127 mg/dL — ABNORMAL HIGH (ref 6–20)
CO2: 12 mmol/L — ABNORMAL LOW (ref 22–32)
Calcium: 6.4 mg/dL — CL (ref 8.9–10.3)
Chloride: 87 mmol/L — ABNORMAL LOW (ref 98–111)
Creatinine, Ser: 10.81 mg/dL — ABNORMAL HIGH (ref 0.61–1.24)
GFR, Estimated: 5 mL/min — ABNORMAL LOW (ref >60)
Glucose, Bld: 94 mg/dL (ref 70–99)
Potassium: 6.5 mmol/L (ref 3.5–5.1)
Sodium: 123 mmol/L — ABNORMAL LOW (ref 135–145)
Total Bilirubin: 1 mg/dL (ref 0.0–1.2)
Total Protein: 6.9 g/dL (ref 6.5–8.1)

## 2023-11-24 LAB — URINALYSIS, W/ REFLEX TO CULTURE (INFECTION SUSPECTED)
Bacteria, UA: NONE SEEN
Bilirubin Urine: NEGATIVE
Glucose, UA: 150 mg/dL — AB
Ketones, ur: NEGATIVE mg/dL
Leukocytes,Ua: NEGATIVE
Nitrite: NEGATIVE
Protein, ur: 100 mg/dL — AB
Specific Gravity, Urine: 1.013 (ref 1.005–1.030)
pH: 5 (ref 5.0–8.0)

## 2023-11-24 LAB — BASIC METABOLIC PANEL WITH GFR
Anion gap: 23 — ABNORMAL HIGH (ref 5–15)
BUN: 127 mg/dL — ABNORMAL HIGH (ref 6–20)
CO2: 12 mmol/L — ABNORMAL LOW (ref 22–32)
Calcium: 5.9 mg/dL — CL (ref 8.9–10.3)
Chloride: 90 mmol/L — ABNORMAL LOW (ref 98–111)
Creatinine, Ser: 11.11 mg/dL — ABNORMAL HIGH (ref 0.61–1.24)
GFR, Estimated: 5 mL/min — ABNORMAL LOW (ref >60)
Glucose, Bld: 94 mg/dL (ref 70–99)
Potassium: 5.4 mmol/L — ABNORMAL HIGH (ref 3.5–5.1)
Sodium: 125 mmol/L — ABNORMAL LOW (ref 135–145)

## 2023-11-24 LAB — I-STAT CG4 LACTIC ACID, ED
Lactic Acid, Venous: 2 mmol/L (ref 0.5–1.9)
Lactic Acid, Venous: 1.2 mmol/L (ref 0.5–1.9)

## 2023-11-24 LAB — CBC WITH DIFFERENTIAL/PLATELET
Abs Immature Granulocytes: 0.15 10*3/uL — ABNORMAL HIGH (ref 0.00–0.07)
Basophils Absolute: 0 10*3/uL (ref 0.0–0.1)
Basophils Relative: 0 %
Eosinophils Absolute: 0 10*3/uL (ref 0.0–0.5)
Eosinophils Relative: 0 %
HCT: 42.2 % (ref 39.0–52.0)
Hemoglobin: 15.2 g/dL (ref 13.0–17.0)
Immature Granulocytes: 1 %
Lymphocytes Relative: 9 %
Lymphs Abs: 1.7 10*3/uL (ref 0.7–4.0)
MCH: 30.5 pg (ref 26.0–34.0)
MCHC: 36 g/dL (ref 30.0–36.0)
MCV: 84.7 fL (ref 80.0–100.0)
Monocytes Absolute: 1.4 10*3/uL — ABNORMAL HIGH (ref 0.1–1.0)
Monocytes Relative: 7 %
Neutro Abs: 15.7 10*3/uL — ABNORMAL HIGH (ref 1.7–7.7)
Neutrophils Relative %: 83 %
Platelets: 271 10*3/uL (ref 150–400)
RBC: 4.98 MIL/uL (ref 4.22–5.81)
RDW: 12.8 % (ref 11.5–15.5)
WBC: 18.9 10*3/uL — ABNORMAL HIGH (ref 4.0–10.5)
nRBC: 0 % (ref 0.0–0.2)

## 2023-11-24 LAB — BRAIN NATRIURETIC PEPTIDE: B Natriuretic Peptide: 52.8 pg/mL (ref 0.0–100.0)

## 2023-11-24 LAB — OSMOLALITY, URINE: Osmolality, Ur: 313 mosm/kg (ref 300–900)

## 2023-11-24 LAB — RAPID URINE DRUG SCREEN, HOSP PERFORMED
Amphetamines: POSITIVE — AB
Barbiturates: NOT DETECTED
Benzodiazepines: NOT DETECTED
Cocaine: NOT DETECTED
Opiates: NOT DETECTED
Tetrahydrocannabinol: NOT DETECTED

## 2023-11-24 LAB — CREATININE, URINE, RANDOM: Creatinine, Urine: 47 mg/dL

## 2023-11-24 LAB — MAGNESIUM: Magnesium: 2.2 mg/dL (ref 1.7–2.4)

## 2023-11-24 LAB — PROTEIN / CREATININE RATIO, URINE
Creatinine, Urine: 47 mg/dL
Protein Creatinine Ratio: 3.09 mg/mg{creat} — ABNORMAL HIGH (ref 0.00–0.15)
Total Protein, Urine: 145 mg/dL

## 2023-11-24 LAB — ETHANOL: Alcohol, Ethyl (B): <10 mg/dL (ref <10)

## 2023-11-24 LAB — LIPASE, BLOOD: Lipase: 141 U/L — ABNORMAL HIGH (ref 11–51)

## 2023-11-24 LAB — CK: Total CK: >50000 U/L — ABNORMAL HIGH (ref 49–397)

## 2023-11-24 LAB — D-DIMER, QUANTITATIVE: D-Dimer, Quant: 4.53 ug{FEU}/mL — ABNORMAL HIGH (ref 0.00–0.50)

## 2023-11-24 LAB — SODIUM, URINE, RANDOM: Sodium, Ur: 81 mmol/L

## 2023-11-24 MED ORDER — SODIUM ZIRCONIUM CYCLOSILICATE 10 G PO PACK
10.0000 g | PACK | Freq: Three times a day (TID) | ORAL | Status: DC
  Administered 2023-11-24 – 2023-11-25 (×4): 10 g via ORAL
  Filled 2023-11-24 (×4): qty 1

## 2023-11-24 MED ORDER — METRONIDAZOLE 500 MG/100ML IV SOLN
500.0000 mg | Freq: Once | INTRAVENOUS | Status: AC
  Administered 2023-11-24: 500 mg via INTRAVENOUS
  Filled 2023-11-24: qty 100

## 2023-11-24 MED ORDER — HYDROMORPHONE HCL 1 MG/ML IJ SOLN
1.0000 mg | Freq: Once | INTRAMUSCULAR | Status: AC
  Administered 2023-11-24: 1 mg via INTRAVENOUS
  Filled 2023-11-24: qty 1

## 2023-11-24 MED ORDER — INSULIN ASPART 100 UNIT/ML IV SOLN
5.0000 [IU] | Freq: Once | INTRAVENOUS | Status: AC
  Administered 2023-11-24: 5 [IU] via INTRAVENOUS

## 2023-11-24 MED ORDER — ALBUTEROL SULFATE (2.5 MG/3ML) 0.083% IN NEBU
10.0000 mg | INHALATION_SOLUTION | Freq: Once | RESPIRATORY_TRACT | Status: AC
  Administered 2023-11-24: 10 mg via RESPIRATORY_TRACT
  Filled 2023-11-24: qty 12

## 2023-11-24 MED ORDER — VANCOMYCIN HCL IN DEXTROSE 1-5 GM/200ML-% IV SOLN: 1000.0000 mg | Freq: Once | INTRAVENOUS | Status: DC

## 2023-11-24 MED ORDER — SODIUM CHLORIDE 0.9 % IV SOLN: INTRAVENOUS | Status: DC

## 2023-11-24 MED ORDER — FENTANYL CITRATE PF 50 MCG/ML IJ SOSY
50.0000 ug | PREFILLED_SYRINGE | Freq: Once | INTRAMUSCULAR | Status: AC
  Administered 2023-11-24: 50 ug via INTRAVENOUS
  Filled 2023-11-24: qty 1

## 2023-11-24 MED ORDER — FUROSEMIDE 10 MG/ML IJ SOLN: 80.0000 mg | Freq: Two times a day (BID) | INTRAMUSCULAR | Status: DC

## 2023-11-24 MED ORDER — BISACODYL 5 MG PO TBEC
5.0000 mg | DELAYED_RELEASE_TABLET | Freq: Every day | ORAL | Status: DC | PRN
  Administered 2023-11-27 – 2023-12-04 (×3): 5 mg via ORAL
  Filled 2023-11-24 (×3): qty 1

## 2023-11-24 MED ORDER — HEPARIN SODIUM (PORCINE) 5000 UNIT/ML IJ SOLN: 5000.0000 [IU] | Freq: Three times a day (TID) | INTRAMUSCULAR | Status: DC

## 2023-11-24 MED ORDER — HYDROMORPHONE HCL 2 MG PO TABS
1.0000 mg | ORAL_TABLET | ORAL | Status: DC | PRN
  Administered 2023-11-25 (×3): 1 mg via ORAL
  Filled 2023-11-24 (×3): qty 1

## 2023-11-24 MED ORDER — HEPARIN BOLUS VIA INFUSION
5000.0000 [IU] | Freq: Once | INTRAVENOUS | Status: AC
  Administered 2023-11-24: 5000 [IU] via INTRAVENOUS
  Filled 2023-11-24: qty 5000

## 2023-11-24 MED ORDER — SODIUM CHLORIDE 0.9 % IV SOLN
2.0000 g | Freq: Once | INTRAVENOUS | Status: AC
  Administered 2023-11-24: 2 g via INTRAVENOUS
  Filled 2023-11-24: qty 12.5

## 2023-11-24 MED ORDER — DEXTROSE 50 % IV SOLN
1.0000 | Freq: Once | INTRAVENOUS | Status: AC
  Administered 2023-11-24: 50 mL via INTRAVENOUS
  Filled 2023-11-24: qty 50

## 2023-11-24 MED ORDER — HYDROMORPHONE HCL 1 MG/ML IJ SOLN
0.5000 mg | INTRAMUSCULAR | Status: DC | PRN
  Administered 2023-11-24 – 2023-11-26 (×2): 0.5 mg via INTRAVENOUS
  Filled 2023-11-24 (×2): qty 1

## 2023-11-24 MED ORDER — STERILE WATER FOR INJECTION IV SOLN
INTRAVENOUS | Status: DC
  Filled 2023-11-24 (×9): qty 1000
  Filled 2023-11-24: qty 150
  Filled 2023-11-24 (×3): qty 1000

## 2023-11-24 MED ORDER — HEPARIN (PORCINE) 25000 UT/250ML-% IV SOLN
2050.0000 [IU]/h | INTRAVENOUS | Status: DC
  Administered 2023-11-24: 1300 [IU]/h via INTRAVENOUS
  Administered 2023-11-25: 1600 [IU]/h via INTRAVENOUS
  Administered 2023-11-26: 1750 [IU]/h via INTRAVENOUS
  Administered 2023-11-26 – 2023-11-27 (×2): 1900 [IU]/h via INTRAVENOUS
  Filled 2023-11-24 (×5): qty 250

## 2023-11-24 MED ORDER — ALBUMIN HUMAN 25 % IV SOLN
25.0000 g | Freq: Once | INTRAVENOUS | Status: AC
  Administered 2023-11-25: 25 g via INTRAVENOUS
  Filled 2023-11-24: qty 100

## 2023-11-24 MED ORDER — CALCIUM GLUCONATE-NACL 1-0.675 GM/50ML-% IV SOLN
1.0000 g | Freq: Once | INTRAVENOUS | Status: AC
Start: 2023-11-24 — End: 2023-11-25
  Administered 2023-11-24: 1000 mg via INTRAVENOUS
  Filled 2023-11-24: qty 50

## 2023-11-24 MED ORDER — ALBUTEROL SULFATE (2.5 MG/3ML) 0.083% IN NEBU
2.5000 mg | INHALATION_SOLUTION | RESPIRATORY_TRACT | Status: DC | PRN
  Administered 2023-11-25: 2.5 mg via RESPIRATORY_TRACT
  Filled 2023-11-24: qty 3

## 2023-11-24 MED ORDER — CALCIUM GLUCONATE 10 % IV SOLN
1.0000 g | Freq: Once | INTRAVENOUS | Status: AC
  Administered 2023-11-24: 1 g via INTRAVENOUS
  Filled 2023-11-24: qty 10

## 2023-11-24 MED ORDER — VANCOMYCIN HCL 1500 MG/300ML IV SOLN
1500.0000 mg | Freq: Once | INTRAVENOUS | Status: AC
  Administered 2023-11-24: 1500 mg via INTRAVENOUS
  Filled 2023-11-24: qty 300

## 2023-11-24 MED ORDER — SEVELAMER CARBONATE 800 MG PO TABS
1600.0000 mg | ORAL_TABLET | Freq: Three times a day (TID) | ORAL | Status: DC
  Administered 2023-11-24 – 2023-12-10 (×44): 1600 mg via ORAL
  Filled 2023-11-24 (×44): qty 2

## 2023-11-24 MED ORDER — LACTATED RINGERS IV SOLN: INTRAVENOUS | Status: DC

## 2023-11-24 MED ORDER — SODIUM CHLORIDE 0.9 % IV BOLUS
1000.0000 mL | Freq: Once | INTRAVENOUS | Status: AC
  Administered 2023-11-24: 1000 mL via INTRAVENOUS

## 2023-11-24 MED ORDER — FUROSEMIDE 10 MG/ML IJ SOLN
80.0000 mg | Freq: Two times a day (BID) | INTRAMUSCULAR | Status: DC
  Administered 2023-11-24: 80 mg via INTRAVENOUS
  Filled 2023-11-24: qty 8

## 2023-11-24 NOTE — ED Provider Notes (Signed)
 [place] EMERGENCY DEPARTMENT AT [facility] Provider Note   CSN: [id number] Arrival date & time: 11/24/23  1215     History  No chief complaint on file.   Kenneth Hunt is a 43 y.o. male.  The history is provided by the patient and medical records. No language interpreter was used.  Illness Location:  Abdominal pain, fatigue, edema in legs, right leg pain Severity:  Severe Onset quality:  Gradual Duration:  3 days Timing:  Constant Progression:  Worsening Chronicity:  New Associated symptoms: abdominal pain, fatigue and rash   Associated symptoms: no chest pain, no congestion, no cough, no diarrhea, no fever, no headaches, no loss of consciousness, no nausea, no shortness of breath, no sore throat, no vomiting and no wheezing        Home Medications Prior to Admission medications   Medication Sig Start Date End Date Taking? Authorizing Provider  ARISTADA 209 808 6269 MG/3.9ML prefilled syringe Inject 1,064 mg into the muscle once. Q 2 months 03/18/19   [provider]  bictegravir-emtricitabine-tenofovir AF (BIKTARVY) 50-200-25 MG TABS tablet Take 1 tablet by mouth daily. 03/14/23   Orson Blalock, NP  DULoxetine (CYMBALTA) 30 MG capsule TAKE 3 CAPSULES(90 MG) BY MOUTH DAILY 08/27/23   Arellano Zameza, Priscila, MD  gabapentin (NEURONTIN) 400 MG capsule TAKE 1 CAPSULE(400 MG) BY MOUTH THREE TIMES DAILY 08/27/23   Arellano Zameza, Priscila, MD  hydrocortisone cream 1 % For flares: Apply to affected area 2 times daily until you see improvement in redness/rash, use for up to 14 days at a time then give your skin a break Patient not taking: Reported on 09/26/2023 08/27/23 08/26/24  Arellano Zameza, Priscila, MD  oxyCODONE (OXY IR/ROXICODONE) 5 MG immediate release tablet Take 1 tablet (5 mg total) by mouth every 6 (six) hours as needed for severe pain. Patient not taking: Reported on 09/26/2023 02/22/23   Joyce Nixon, MD      Allergies    Haloperidol     Review of Systems   Review of Systems  Constitutional:  Positive for fatigue. Negative for chills and fever.  HENT:  Negative for congestion and sore throat.   Respiratory:  Negative for cough, chest tightness, shortness of breath and wheezing.   Cardiovascular:  Positive for leg swelling. Negative for chest pain and palpitations.  Gastrointestinal:  Positive for abdominal pain. Negative for constipation, diarrhea, nausea and vomiting.  Genitourinary:  Negative for dysuria, flank pain and frequency.  Musculoskeletal:  Negative for back pain, neck pain and neck stiffness.  Skin:  Positive for color change, rash and wound.  Neurological:  Negative for loss of consciousness, weakness, light-headedness, numbness and headaches.  Psychiatric/Behavioral:  Negative for agitation and confusion.   All other systems reviewed and are negative.   Physical Exam Updated Vital Signs There were no vitals taken for this visit. Physical Exam Vitals and nursing note reviewed.  Constitutional:      General: He is not in acute distress.    Appearance: He is well-developed. He is not ill-appearing, toxic-appearing or diaphoretic.  HENT:     Head: Normocephalic and atraumatic.     Nose: No congestion or rhinorrhea.     Mouth/Throat:     Mouth: Mucous membranes are dry.     Pharynx: No oropharyngeal exudate or posterior oropharyngeal erythema.  Eyes:     Extraocular Movements: Extraocular movements intact.     Conjunctiva/sclera: Conjunctivae normal.     Pupils: Pupils are equal, round, and reactive to light.  Cardiovascular:     Rate and Rhythm: Regular rhythm. Tachycardia present.     Pulses: Normal pulses.     Heart sounds: No murmur heard. Pulmonary:     Effort: Pulmonary effort is normal. No respiratory distress.     Breath sounds: Normal breath sounds. No wheezing, rhonchi or rales.  Chest:     Chest wall: No tenderness.  Abdominal:     General: Abdomen is flat.     Palpations: Abdomen  is soft.     Tenderness: There is abdominal tenderness. There is no right CVA tenderness, left CVA tenderness, guarding or rebound.  Musculoskeletal:        General: Swelling and tenderness present.     Cervical back: Neck supple. No tenderness.     Right lower leg: Edema present.     Left lower leg: Edema present.  Skin:    General: Skin is warm and dry.     Capillary Refill: Capillary refill takes less than 2 seconds.     Findings: Erythema and rash present.  Neurological:     General: No focal deficit present.     Mental Status: He is alert.     Sensory: No sensory deficit.     Motor: No weakness.  Psychiatric:        Mood and Affect: Mood normal.           ED Results / Procedures / Treatments   Labs (all labs ordered are listed, but only abnormal results are displayed) Labs Reviewed  CBC WITH DIFFERENTIAL/PLATELET - Abnormal; Notable for the following components:      Result Value   WBC 18.9 (*)    Neutro Abs 15.7 (*)    Monocytes Absolute 1.4 (*)    Abs Immature Granulocytes 0.15 (*)    All other components within normal limits  COMPREHENSIVE METABOLIC PANEL WITH GFR - Abnormal; Notable for the following components:   Sodium 123 (*)    Potassium 6.5 (*)    Chloride 87 (*)    CO2 12 (*)    BUN 127 (*)    Creatinine, Ser 10.81 (*)    Calcium 6.4 (*)    Albumin 3.1 (*)    AST 1,578 (*)    ALT 688 (*)    GFR, Estimated 5 (*)    Anion gap 24 (*)    All other components within normal limits  CK - Abnormal; Notable for the following components:   Total CK >50,000 (*)    All other components within normal limits  I-STAT CG4 LACTIC ACID, ED - Abnormal; Notable for the following components:   Lactic Acid, Venous 2.0 (*)    All other components within normal limits  I-STAT CHEM 8, ED - Abnormal; Notable for the following components:   Sodium 119 (*)    Potassium 6.1 (*)    Chloride 93 (*)    BUN >130 (*)    Creatinine, Ser 11.70 (*)    Calcium, Ion 0.71 (*)     TCO2 16 (*)    All other components within normal limits  CULTURE, BLOOD (ROUTINE X 2)  CULTURE, BLOOD (ROUTINE X 2)  URINE CULTURE  BRAIN NATRIURETIC PEPTIDE  URINALYSIS, W/ REFLEX TO CULTURE (INFECTION SUSPECTED)  LIPASE, BLOOD  I-STAT CG4 LACTIC ACID, ED    EKG EKG Interpretation Date/Time:  Sunday November 24 2023 12:29:12 EDT Ventricular Rate:  98 PR Interval:  145 QRS Duration:  89 QT Interval:  343 QTC Calculation: 438 R Axis:  72  Text Interpretation: Sinus rhythm ST elev, probable normal early repol pattern when compared to prior, sharper t waves and slower rate No STEMI Confirmed by Wynell Heath ([id number]) on 11/24/2023 12:34:22 PM  Radiology DG Chest Portable 1 View Result Date: 11/24/2023 CLINICAL DATA:  I edema, tachycardia EXAM: PORTABLE CHEST 1 VIEW COMPARISON:  None Available. FINDINGS: Normal cardiac silhouette. Subtle airspace density in the RIGHT lower lobe. Relatively low lung volumes. No pneumothorax. No acute osseous abnormality. IMPRESSION: Low lung volumes. Atelectasis versus infiltrate at the RIGHT lung base. Electronically Signed   By: Deboraha Fallow M.D.   On: 11/24/2023 13:47    Procedures Procedures    CRITICAL CARE Performed by: Marine Sia Anaiyah Anglemyer Total critical care time: 45 minutes Critical care time was exclusive of separately billable procedures and treating other patients. Critical care was necessary to treat or prevent imminent or life-threatening deterioration. Critical care was time spent personally by me on the following activities: development of treatment plan with patient and/or surrogate as well as nursing, discussions with consultants, evaluation of patient's response to treatment, examination of patient, obtaining history from patient or surrogate, ordering and performing treatments and interventions, ordering and review of laboratory studies, ordering and review of radiographic studies, pulse oximetry and re-evaluation of  patient's condition.  Medications Ordered in ED Medications  metroNIDAZOLE (FLAGYL) IVPB 500 mg (500 mg Intravenous New Bag/Given 11/24/23 1521)  vancomycin (VANCOREADY) IVPB 1500 mg/300 mL (1,500 mg Intravenous New Bag/Given 11/24/23 1519)  0.9 %  sodium chloride infusion ( Intravenous New Bag/Given 11/24/23 1418)  HYDROmorphone (DILAUDID) injection 1 mg (1 mg Intravenous Given 11/24/23 1319)  sodium chloride 0.9 % bolus 1,000 mL (1,000 mLs Intravenous New Bag/Given 11/24/23 1419)  albuterol (PROVENTIL) (2.5 MG/3ML) 0.083% nebulizer solution 10 mg (10 mg Nebulization Given by Other 11/24/23 1415)  insulin aspart (novoLOG) injection 5 Units (5 Units Intravenous Given 11/24/23 1407)    And  dextrose 50 % solution 50 mL (50 mLs Intravenous Given 11/24/23 1406)  calcium gluconate inj 10% (1 g) URGENT USE ONLY! (1 g Intravenous Given 11/24/23 1407)  ceFEPIme (MAXIPIME) 2 g in sodium chloride 0.9 % 100 mL IVPB (0 g Intravenous Stopped 11/24/23 1449)    ED Course/ Medical Decision Making/ A&P                                 Medical Decision Making Amount and/or Complexity of Data Reviewed Labs: ordered. Radiology: ordered.  Risk OTC drugs. Prescription drug management.    Kenneth Hunt is a 43 y.o. male with a past medical history significant bipolar disorder, HIV, previous thoracic outlet syndrome, anxiety, depression, and persistent IV drug abuse injecting methamphetamines most recently injected 3 days ago primarily to his arms who presents with several days of abdominal pain, bilateral leg pain right worse than left and bilateral leg swelling right worse than left.  According to patient, he is unable to walk today due to the severe pain in his legs.  He has not been eating or drinking as he is not been getting up and moving around.  He reports his right leg is the worst with significant swelling pain and tenderness.  He reports no history of injecting anything into his right leg.  He noticed  some blistering and wound on the right calf which is new.  He denies any contact with irritants or any plants that could have caused blistering.  He reports no  fevers or chills but is tachycardic and tachypneic on arrival.  He is warm to the touch, will get a rectal temp.  He is complaining of some lower abdominal aching for the last few days.  Denies history of any blood clots and denies history of compartment syndrome.  He is concerned about the pain and swelling.  On exam, patient is tachycardic and tachypneic and warm.  Lungs were clear and I did not appreciate a murmur initially.  Abdomen was tender across the lower abdomen.  Patient has significant tenderness and swelling to both legs but the right leg is much worse with redness.  There are some small red bumps on his lateral right shin, see photo.  There is also peculiar linear blistering on his right calf going horizontally primarily.  Distally I could not feel pulses in either leg but a Doppler was used and pulses were found in both the PT and DP arteries bilaterally.  Clinically this is a very confusing picture.  I am concerned given the patient's significant swelling and pain and tenderness and hard shin/calf area.  With the nonpalpable pulses only found on Doppler I am somewhat concerned about arterial flow as well.  We will get DVT ultrasounds but will also get a CT runoff to assess blood flow but also look for abscess in the muscles.  Will get screening labs and will get a rectal temperature as I am concerned about possible infection.  Will also make sure there is not some mass or some other abnormality in his abdomen pelvis causing a compressive edema in the legs.  His EKG did not show STEMI but did show some sharp T waves, will make sure his potassium is not critically elevated.  Due to the severe pain, swelling, rapid worsening, and concerning vital signs, anticipate patient may require admission after workup.  2:07 PM Spoke to Dr.  Ansel Kingdom with nephrology and made her aware of the patient's concerning creatinine, electrolytes, and elevated BUN.  Will await further recommendations from a renal standpoint however wait for other workup.  We will get the CT without contrast of the abdomen pelvis and the right leg as we have more concern for possible infection in there.  His x-ray does show possible pneumonia.  He is getting broad-spectrum antibiotics.  His vital signs do appear stable at the moment but I am concerned about his overall appearance.  Anticipate admission after workup is further.  Care transferred to oncoming team to wait for workup results to return.  Anticipate if vital signs worsen he may need ICU level care given his suspected multisystem organ failure.        Final Clinical Impression(s) / ED Diagnoses Final diagnoses:  Peripheral edema  Leg swelling  AKI (acute kidney injury) (HCC)  LFT elevation  Abdominal pain, unspecified abdominal location     Clinical Impression: 1. Peripheral edema   2. Leg swelling   3. AKI (acute kidney injury) (HCC)   4. LFT elevation   5. Abdominal pain, unspecified abdominal location     Disposition: Admit  This note was prepared with assistance of Dragon voice recognition software. Occasional wrong-word or sound-a-like substitutions may have occurred due to the inherent limitations of voice recognition software.     Sinclair Arrazola, Marine Sia, MD 11/24/23 [phone]

## 2023-11-24 NOTE — Consult Note (Signed)
 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  He says he was in his usual state of health until 3 or 4 days ago.  He injected methamphetamine into his arms at that time-  since then has suffered with bilateral LE pain with swelling , abdominal pain.  He reports staying in bed for 3 days, then today was unable to walk so came to ER.  He was tachycardic and hypertensive upon arrival.  He also states that his UOP has been down and dark.   He was foind to have a crt of 10.3 and BUN of 127-  K of 6.5, bicarb of 12.  His crt exactly 2 months ago was 1.29. The last time he voided was this AM-  he denies any other drug use, no NSAIDS, no flank pain.  Imaging of his abdomen is pending but CK has returned at 50,000  Creat  Date/Time Value Ref Range Status  09/26/2023 02:55 PM 1.29 0.60 - 1.29 mg/dL Final  16/05/9603 54:09 PM 0.94 0.60 - 1.29 mg/dL Final  81/19/1478 29:56 PM 1.02 0.60 - 1.35 mg/dL Final   Creatinine, Ser  Date/Time Value Ref Range Status  11/24/2023 01:33 PM 11.70 (H) 0.61 - 1.24 mg/dL Final  21/30/8657 84:69 PM 10.81 (H) 0.61 - 1.24 mg/dL Final  62/95/2841 32:44 PM 0.92 0.61 - 1.24 mg/dL Final  08/15/7251 66:44 PM 0.87 0.61 - 1.24 mg/dL Final  03/47/4259 56:38 PM 1.07 0.61 - 1.24 mg/dL Final  75/64/3329 51:88 AM 1.68 (H) 0.61 - 1.24 mg/dL Final  41/66/0630 16:01 AM 1.04 0.61 - 1.24 mg/dL Final  09/32/3557 32:20 PM 0.84 0.61 - 1.24 mg/dL Final  25/42/7062 37:62 AM 0.97 0.61 - 1.24 mg/dL Final     PMHx:   Past Medical History:  Diagnosis Date   Anal fistula    Anxiety    Bipolar disorder (HCC)    Brachial plexus disorders 07/31/2018   Seen by [facility] neurology. MRI brain and cervical spine showed bilateral neural foraminal cysts  C4-C5, C5-C6, C6-C7, brain within normal limits.  - continue cymbalta, flexeril, neurontin - f/u with Dr. Marden [facility] neurology 03/28/19   Depression    History of syphilis 12/30/2017   RPR 1:1 12/25/17 s/p adequate treatment   HIV (human immunodeficiency virus infection) (HCC)    IV drug abuse (HCC)    TOS (thoracic outlet syndrome)     Past Surgical History:  Procedure Laterality Date   EVALUATION UNDER ANESTHESIA WITH ANAL FISSUROTOMY N/A 02/22/2023   Procedure: INTERROGATION EXAM UNDER ANESTHESIA WITH ANAL FISTULOTOMY;  Surgeon: Joyce Nixon, MD;  Location: [facility];  Service: General;  Laterality: N/A;   MINOR FULGERATION OF ANAL CONDYLOMA  02/22/2023   Procedure: ABLATION OF ANAL CONDYLOMA;  Surgeon: Joyce Nixon, MD;  Location: [facility] ;  Service: General;;   thoracic outlet syndrome surgery  2013    Family Hx:  Family History  Problem Relation Age of Onset   COPD Mother     Social History:  reports that he has never smoked. He has never used smokeless tobacco. He reports that he does not currently use alcohol. He reports that he does not currently use drugs after having used the following drugs: IV and Methamphetamines.  Allergies:  Allergies  Allergen Reactions  Haloperidol Other (See Comments)    Tardive dyskinesia    Medications: Prior to Admission medications   Medication Sig Start Date End Date Taking? Authorizing Provider  ARISTADA [phone] MG/3.9ML prefilled syringe Inject 1,064 mg into the muscle once. Q 2 months 03/18/19   [provider]  bictegravir-emtricitabine-tenofovir AF (BIKTARVY) 50-200-25 MG TABS tablet Take 1 tablet by mouth daily. 03/14/23   Orson Blalock, NP  DULoxetine (CYMBALTA) 30 MG capsule TAKE 3 CAPSULES(90 MG) BY MOUTH DAILY 08/27/23   Arellano Zameza, Priscila, MD  gabapentin (NEURONTIN) 400 MG capsule TAKE 1 CAPSULE(400 MG) BY MOUTH THREE TIMES DAILY 08/27/23   Arellano Zameza, Priscila,  MD  hydrocortisone cream 1 % For flares: Apply to affected area 2 times daily until you see improvement in redness/rash, use for up to 14 days at a time then give your skin a break Patient not taking: Reported on 09/26/2023 08/27/23 08/26/24  Arellano Zameza, Priscila, MD  oxyCODONE (OXY IR/ROXICODONE) 5 MG immediate release tablet Take 1 tablet (5 mg total) by mouth every 6 (six) hours as needed for severe pain. Patient not taking: Reported on 09/26/2023 02/22/23   Joyce Nixon, MD    I have reviewed the patient's current medications.  Labs:  Results for orders placed or performed during the hospital encounter of 11/24/23 (from the past 48 hours)  CBC with Differential     Status: Abnormal   Collection Time: 11/24/23  1:00 PM  Result Value Ref Range   WBC 18.9 (H) 4.0 - 10.5 K/uL   RBC 4.98 4.22 - 5.81 MIL/uL   Hemoglobin 15.2 13.0 - 17.0 g/dL   HCT 96.0 45.4 - 09.8 %   MCV 84.7 80.0 - 100.0 fL   MCH 30.5 26.0 - 34.0 pg   MCHC 36.0 30.0 - 36.0 g/dL   RDW 11.9 14.7 - 82.9 %   Platelets 271 150 - 400 K/uL   nRBC 0.0 0.0 - 0.2 %   Neutrophils Relative % 83 %   Neutro Abs 15.7 (H) 1.7 - 7.7 K/uL   Lymphocytes Relative 9 %   Lymphs Abs 1.7 0.7 - 4.0 K/uL   Monocytes Relative 7 %   Monocytes Absolute 1.4 (H) 0.1 - 1.0 K/uL   Eosinophils Relative 0 %   Eosinophils Absolute 0.0 0.0 - 0.5 K/uL   Basophils Relative 0 %   Basophils Absolute 0.0 0.0 - 0.1 K/uL   Immature Granulocytes 1 %   Abs Immature Granulocytes 0.15 (H) 0.00 - 0.07 K/uL    Comment: Performed at [facility], [address], [place], [place] [address]  Comprehensive metabolic panel     Status: Abnormal   Collection Time: 11/24/23  1:00 PM  Result Value Ref Range   Sodium 123 (L) 135 - 145 mmol/L   Potassium 6.5 (HH) 3.5 - 5.1 mmol/L    Comment: CRITICAL RESULT CALLED TO, READ BACK BY AND VERIFIED WITH M,CARRALERO RN @1417  11/24/23 E,[place]   Chloride 87 (L) 98 - 111 mmol/L   CO2 12 (L) 22 - 32 mmol/L   Glucose,  Bld 94 70 - 99 mg/dL    Comment: Glucose reference range applies only to samples taken after fasting for at least 8 hours.   BUN 127 (H) 6 - 20 mg/dL   Creatinine, Ser 08.65 (H) 0.61 - 1.24 mg/dL   Calcium 6.4 (LL) 8.9 - 10.3 mg/dL    Comment: CRITICAL RESULT CALLED TO, READ BACK BY AND VERIFIED WITH [facility] RN @1417  11/24/23 E,[place]  Total Protein 6.9 6.5 - 8.1 g/dL   Albumin 3.1 (L) 3.5 - 5.0 g/dL   AST 1,610 (H) 15 - 41 U/L   ALT 688 (H) 0 - 44 U/L   Alkaline Phosphatase 63 38 - 126 U/L   Total Bilirubin 1.0 0.0 - 1.2 mg/dL   GFR, Estimated 5 (L) >60 mL/min    Comment: (NOTE) Calculated using the CKD-EPI Creatinine Equation (2021)    Anion gap 24 (H) 5 - 15    Comment: ELECTROLYTES REPEATED TO VERIFY Performed at [facility], [address] [place], [place] [address]   I-stat chem 8, ED (not at [facility], [facility] or [facility])     Status: Abnormal   Collection Time: 11/24/23  1:33 PM  Result Value Ref Range   Sodium 119 (LL) 135 - 145 mmol/L   Potassium 6.1 (H) 3.5 - 5.1 mmol/L   Chloride 93 (L) 98 - 111 mmol/L   BUN >130 (H) 6 - 20 mg/dL   Creatinine, Ser 40.98 (H) 0.61 - 1.24 mg/dL   Glucose, Bld 89 70 - 99 mg/dL    Comment: Glucose reference range applies only to samples taken after fasting for at least 8 hours.   Calcium, Ion 0.71 (LL) 1.15 - 1.40 mmol/L   TCO2 16 (L) 22 - 32 mmol/L   Hemoglobin 15.0 13.0 - 17.0 g/dL   HCT 11.9 14.7 - 82.9 %   Comment NOTIFIED PHYSICIAN   I-Stat CG4 Lactic Acid     Status: Abnormal   Collection Time: 11/24/23  1:34 PM  Result Value Ref Range   Lactic Acid, Venous 2.0 (HH) 0.5 - 1.9 mmol/L   Comment NOTIFIED PHYSICIAN      ROS:  A comprehensive review of systems was negative except for: Gastrointestinal: positive for abdominal pain Genitourinary: positive for decreased stream Musculoskeletal: positive for muscle weakness and myalgias  Physical Exam: Vitals:   11/24/23 1400 11/24/23 1415  BP: (!) 152/84   Pulse: 97   Resp: (!)  21   Temp:    SpO2: 100% 99%     General: anxious appearing WM-  NAD HEENT: PERRLA, EOMI, mucous membranes moist  Neck: positive for JVD Heart: tachy Lungs: some dec BS at right base Abdomen: distended, tender only to deep palpation Extremities: tight edema, erythema, tender to touch  Skin: warm and dry Neuro: anxious appearing-  non focal  Assessment/Plan: 43 year old WM with bipolar and HIV.  Presents with LE edema and myalgias as well as AKI-  appears to have rhabdomyolysis  1.Renal- crt 1.29 2 mos ago, now 10-11.  CK of over 50,000 after being in bed for 3 days following methamphetamine injection.  I also see that his antipsychotic has an adverse effect of rhabdo.   I saw that later so not sure when he last dosed himself.  I am concerned at the degree of his renal failure and that he already has edema.  Will try to use high dose bicarb based fluids for now as well as lasix -  follow serial labs.  I am concerned that he will require dialysis before all is said and done ans I have told him that 2. Hypertension/volume  - already overloaded-  am going to give volume and try to challenge with lasix as well-  follow for any respiratory compromise which he does not have right now  3. Hyperkalemia-  high dose lokelma, bicarb fluids and lasix-  serial labs 4. Anemia  - not an issue  Reche Canales 11/24/2023, 3:06 PM

## 2023-11-24 NOTE — ED Provider Notes (Signed)
 43 year old male presenting today with findings on initial labs concerning for multisystem organ failure.  CT imaging is pending at the time of signout.  Plan is for admission after imaging results.  The patient was found to have an infiltrate versus atelectasis on x-ray.  He has already been covered with broad-spectrum antibiotics.  Plan is for admission after imaging.  Physical Exam  BP (!) 130/92   Pulse (!) 114   Temp 98.3 F (36.8 C) (Rectal)   Resp (!) 21   Ht 5\' 3"  (1.6 m)   Wt 77.1 kg   SpO2 98%   BMI 30.11 kg/m   Physical Exam General: Ill-appearing but nontoxic, vital signs stable  Procedures  Procedures  ED Course / MDM    Medical Decision Making Amount and/or Complexity of Data Reviewed Labs: ordered. Radiology: ordered.  Risk OTC drugs. Prescription drug management. Decision regarding hospitalization.   The patient CT scan of his abdomen is unremarkable.  Chest x-ray does show findings concerning for possible pneumonia.  The patient is already covered with antibiotics.  He has been seen by nephrology and orders have been placed.  Calls placed to the internal medicine service for admission.       Carin Charleston, MD 11/24/23 [phone]

## 2023-11-24 NOTE — Progress Notes (Signed)
 VASCULAR LAB    Bilateral lower extremity venous duplex has been performed.  See CV proc for preliminary results.  Gave verbal report to admitting MD  Babe Anthis, RVT 11/24/2023, 8:38 PM

## 2023-11-24 NOTE — ED Notes (Signed)
 Patient transported to CT

## 2023-11-24 NOTE — ED Triage Notes (Addendum)
 Pt BIB [facility] from home called out for bilateral leg swelling for the past 3 days - is painful. 7/10 pain with EMS. Pt denies SOB. Lung sounds clear per EMS. Substance abuse hx (meth use 3 days ago) and psych hx per EMS. Alert and oriented x4.   138/76 100 HR 99% RA 99.2

## 2023-11-24 NOTE — Progress Notes (Signed)
 ANTICOAGULATION CONSULT NOTE  Pharmacy Consult for Heparin Indication: DVT  Allergies  Allergen Reactions   Haloperidol Other (See Comments)    Tardive dyskinesia    Patient Measurements: Height: 5\' 3"  (160 cm) Weight: 77.1 kg (170 lb) IBW/kg (Calculated) : 56.9 Heparin Dosing Weight: 72.9 kg  Vital Signs: Temp: 98.1 F (36.7 C) (04/13 1707) Temp Source: Rectal (04/13 1322) BP: 142/73 (04/13 1900) Pulse Rate: 102 (04/13 1900)  Labs: Recent Labs    11/24/23 1300 11/24/23 1333 11/24/23 1740  HGB 15.2 15.0  --   HCT 42.2 44.0  --   PLT 271  --   --   CREATININE 10.81* 11.70* 10.95*  CKTOTAL >50,000*  --   --     Estimated Creatinine Clearance: 7.9 mL/min (A) (by C-G formula based on SCr of 10.95 mg/dL (H)).   Medical History: Past Medical History:  Diagnosis Date   Anal fistula    Anxiety    Bipolar disorder (HCC)    Brachial plexus disorders 07/31/2018   Seen by [facility] neurology. MRI brain and cervical spine showed bilateral neural foraminal cysts C4-C5, C5-C6, C6-C7, brain within normal limits.  - continue cymbalta, flexeril, neurontin - f/u with Dr. Marden Shaggy [facility] neurology 03/28/19   Depression    History of syphilis 12/30/2017   RPR 1:1 12/25/17 s/p adequate treatment   HIV (human immunodeficiency virus infection) (HCC)    IV drug abuse (HCC)    TOS (thoracic outlet syndrome)     Medications:  (Not in a hospital admission)  Scheduled:   furosemide  80 mg Intravenous Q12H   sodium zirconium cyclosilicate  10 g Oral TID   Infusions:   sodium bicarbonate 150 mEq in sterile water 1,150 mL infusion 200 mL/hr at 11/24/23 1737   PRN: albuterol, bisacodyl  Assessment: 42 yom with a history of bipolar disorder, HIV, previous thoracic outlet syndrome, anxiety, depression, IVDU (methamphetamines). Patient is presenting with several days of abdominal pain, bilateral leg pain right worse than left and bilateral leg swelling right worse than  left. Heparin per pharmacy consult placed for DVT.  VAS US  w/ bilateral DVT  Patient is not on anticoagulation prior to arrival.  Hgb 15; plt 271 D-Dimer 4.53  Goal of Therapy:  Heparin level 0.3-0.7 units/ml Monitor platelets by anticoagulation protocol: Yes   Plan:  Give IV heparin 5000 units bolus x 1 Start heparin infusion at 1300 units/hr Check anti-Xa level in 8 hours and daily while on heparin Continue to monitor H&H and platelets  Dionicio Fray, PharmD, BCPS 11/24/2023 8:22 PM ED Clinical Pharmacist -  [phone]

## 2023-11-24 NOTE — ED Notes (Signed)
[facility] tech in room.

## 2023-11-24 NOTE — ED Notes (Signed)
 Lab to add-on lipase

## 2023-11-24 NOTE — H&P (Cosign Needed Addendum)
 Date: 11/25/2023               Patient Name:  Kenneth Hunt MRN: [id number]  DOB: 01-Oct-1980 Age / Sex: 43 y.o., male   PCP: Philomena Doheny, MD         Medical Service: Internal Medicine Teaching Service         Attending Physician: Dr. Mayford Knife, Dorene Ar, MD      First Contact: Dr. Monna Fam, MD Pager [phone]    Second Contact: Dr. Rocky Morel, DO          After Hours (After 5p/  First Contact Pager: [phone]  weekends / holidays): Second Contact Pager: [phone]   SUBJECTIVE   Chief Complaint: leg pains  History of Present Illness:   [name] is a 43 year old male with past medical history of hipolar disorder, HIV, anxiety, depression, thoracic outlet syndrome, IV drug use (methamphetamine), most recent use 3 days ago who is presenting with several days of lower extremity swelling/pain and oliguria.  Pt is an [facility] clinic patient and last seen 08/2023.  Pt was in his usual state of health until Thursday 11/21/2023, at which time he used injectable methamphetamine. After injection he says he got "stuck" and was hyper fixated scrolling on his phone.  He states he was laying in awkward position with his knees bent behind him.  He says he was stuck in this position throughout the night, and got up on Friday morning. At that time he began having great pain in legs and back.  That morning he states that he went to take a shower, and noticed he had cola colored urine at that time.  He is hazy on some of the details regarding this event.  Since that time the patient states that he has had decreased appetite, decreased p.o. intake, difficulty ambulating, pain in the bilateral lower extremities - right worse than left, cramping of the lower extremities, and oliguria.  He also noticed some cola/tea colored urine since that time which she says has improved slightly.  He does state that his urine output is less than usual.  He otherwise denies difficulty  swallowing, chest pain, shortness of breath, pleuritic chest pain, abdominal pain, nausea, vomiting, diarrhea, rashes, back pain, or mood changes.  He does endorse some worsening dyspnea on exertion which is relatively mild.  Review of Systems: Please see HPI and A&P for pertinent positives and negatives.    Meds:  Biktarvy 50-200-25 Duloxetine 30mg  TID  Gabapentin 400mg  TID Aristada IM Injection (once every 2 months, last injection 2 weeks)  ED Course: Vitals: Hypertensive and at times tachycardic on arrival, most recently within normal limits.  Urine output charted at 100 mL. Labs: CBC with significant leukocytosis to 18.9, CMP on admission significant for sodium 123, critical potassium 6.5, chloride 87, bicarb 12, BUN 127, creatinine 10.8, calcium 6.4, AST 1500, ALT 688, anion gap elevated at 24.  CK significantly elevated at greater than 50,000.  D-dimer elevated 4.53.  Lactic at 2.0.  UA demonstrating large urine dipstick, no RBCs seen.  Significant proteinuria. Imaging: Chest x-ray with atelectasis versus right lung base infiltrate.  CT abdomen pelvis without contrast demonstrating mild right renal edema, no obstructing calculi.  CT of the right lower extremity without contrast demonstrating subcutaneous soft tissue edema without focal fluid collection to suggest abscess.  Right upper quadrant ultrasound normal.  DVT study pending read, demonstrating right greater than left lower extremity DVTs. EKG: T waves increased amplitude compared  to prior.  QTc within normal limits. In the ED patient was given: Albuterol, calcium gluconate, D50 W, 5 units insulin, 50 mcg IV fentanyl, 80 mg Lasix IV, Dilaudid 1 mg IV, Lokelma 10 g, various fluids such as NS and LR, cefepime 2 g, Flagyl, Vanc.  Now on heparin, and bicarb. Nephrology was consulted, following and dropping recommendations.  Appreciate their help.   Meds:  Current Meds  Medication Sig   ARISTADA 1064 MG/3.9ML prefilled syringe Inject  1,064 mg into the muscle every 2 (two) months.   bictegravir-emtricitabine-tenofovir AF (BIKTARVY) 50-200-25 MG TABS tablet Take 1 tablet by mouth daily.   DULoxetine (CYMBALTA) 30 MG capsule TAKE 3 CAPSULES(90 MG) BY MOUTH DAILY    Past Medical History  Past Surgical History:  Procedure Laterality Date   EVALUATION UNDER ANESTHESIA WITH ANAL FISSUROTOMY N/A 02/22/2023   Procedure: INTERROGATION EXAM UNDER ANESTHESIA WITH ANAL FISTULOTOMY;  Surgeon: Joyce Nixon, MD;  Location: [facility];  Service: General;  Laterality: N/A;   MINOR FULGERATION OF ANAL CONDYLOMA  02/22/2023   Procedure: ABLATION OF ANAL CONDYLOMA;  Surgeon: Joyce Nixon, MD;  Location: [facility];  Service: General;;   thoracic outlet syndrome surgery  2013    Social:  Lives With room mate in [place]. Moved to [place] 4 years ago.  Originally from [place] . 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  Level of Function: independent in ADLs and iADLs PCP: Dr. Roddy Citizen MD Substances:Amphetamines, previously sober since October.  Relapsed during recent event.  Denies history of other drugs.  Family History:  HTN: none DMII: none Kidney: none Cancers: none  Allergies: Allergies as of 11/24/2023 - Review Complete 11/24/2023  Allergen Reaction Noted   Haloperidol Other (See Comments) 09/10/2015  Causes tardive dyskinesia after few doses per patient  OBJECTIVE:   Physical Exam: Blood pressure (!) 142/73, pulse (!) 102, temperature 98.1 F (36.7 C), resp. rate (!) 23, height 5\' 3"  (1.6 m), weight 77.1 kg, SpO2 100%.  Constitutional: well-appearing, uncomfortable Neck: No JVD appreciated Cardiovascular: regular rate and rhythm, no m/r/g Pulmonary/Chest: Decreased work of breathing on room air, lungs clear to auscultation bilaterally.  No crackles appreciated Abdominal: soft, non-tender, non-distended MSK: No significant flank pain Extremities: Bilateral right greater than  left lower extremity edema, 3+ to the knees.  Intact dorsalis pedis and posterior tibialis pulses of the lower extremities bilaterally. Neurological: Oriented to time, person, place, situation  Psych: Pleasant affect, concern mood  Labs: CBC    Component Value Date/Time   WBC 13.4 (H) 11/25/2023 0325   RBC 4.20 (L) 11/25/2023 0325   HGB 12.9 (L) 11/25/2023 0325   HGB 16.1 08/20/2022 1424   HCT 35.3 (L) 11/25/2023 0325   HCT 47.8 08/20/2022 1424   PLT 216 11/25/2023 0325   PLT 313 08/20/2022 1424   MCV 84.0 11/25/2023 0325   MCV 93 08/20/2022 1424   MCH 30.7 11/25/2023 0325   MCHC 36.5 (H) 11/25/2023 0325   RDW 12.7 11/25/2023 0325   RDW 11.9 08/20/2022 1424   LYMPHSABS 1.7 11/24/2023 1300   LYMPHSABS 1.9 08/20/2022 1424   MONOABS 1.4 (H) 11/24/2023 1300   EOSABS 0.0 11/24/2023 1300   EOSABS 0.0 08/20/2022 1424   BASOSABS 0.0 11/24/2023 1300   BASOSABS 0.0 08/20/2022 1424     CMP     Component Value Date/Time   NA 125 (L) 11/25/2023 0736   K 4.6 11/25/2023 0736   CL 87 (L) 11/25/2023 4098  CO2 15 (L) 11/25/2023 0736   GLUCOSE 132 (H) 11/25/2023 0736   BUN 139 (H) 11/25/2023 0736   CREATININE 11.58 (H) 11/25/2023 0736   CREATININE 1.29 09/26/2023 1455   CALCIUM 5.9 (LL) 11/25/2023 0736   PROT 6.9 11/24/2023 1300   ALBUMIN 2.5 (L) 11/24/2023 1740   AST 1,578 (H) 11/24/2023 1300   ALT 688 (H) 11/24/2023 1300   ALKPHOS 63 11/24/2023 1300   BILITOT 1.0 11/24/2023 1300   GFRNONAA 5 (L) 11/25/2023 0736   GFRNONAA 92 07/23/2019 1426   GFRAA 58 (L) 03/05/2020 0345   GFRAA 106 07/23/2019 1426   US RENAL Result Date: 11/25/2023 IMPRESSION: Medical renal disease with normal if not enlarged renal size. Electronically Signed   By: Tiburcio Pea M.D.   On: 11/25/2023 04:38   US Abdomen Limited RUQ (LIVER/GB) Result Date: 11/24/2023 MPRESSION: Normal right upper quadrant ultrasound. Electronically Signed   By: Darliss Cheney M.D.   On: 11/24/2023 21:14   VAS [place] LOWER  EXTREMITY VENOUS (DVT) (ONLY [facility] & [facility]) Result Date: 11/24/2023 Summary: RIGHT: - Findings consistent with acute deep vein thrombosis involving the right posterior tibial veins, and right peroneal veins. Technically limited and difficult study secondary to tightness of calf and popliteal fossa.   LEFT: - Findings consistent with acute deep vein thrombosis involving the proximal to mid left posterior tibial veins, and left peroneal veins. Technically limited and difficult study secondary to tightness of calf and popliteal fossa.   *See table(s) above for measurements and observations.    Preliminary    CT EXTREMITY LOWER RIGHT WO CONTRAST Result Date: 11/24/2023 IMPRESSION: Edema throughout the subcutaneous soft tissues of the right lower extremity. No focal fluid collection to suggest abscess. No acute bony abnormality. Electronically Signed   By: Charlett Nose M.D.   On: 11/24/2023 18:07   CT ABDOMEN PELVIS WO CONTRAST Result Date: 11/24/2023 IMPRESSION: 1. Mild RIGHT renal edema. No hydronephrosis. No obstructing calculi. Indeterminate finding. Recommend clinical correlation for renal infection. 2. Normal LEFT kidney. 3. Normal appendix. Electronically Signed   By: Genevive Bi M.D.   On: 11/24/2023 17:40   DG Chest Portable 1 View Result Date: 11/24/2023 IMPRESSION: Low lung volumes. Atelectasis versus infiltrate at the RIGHT lung base. Electronically Signed   By: Genevive Bi M.D.   On: 11/24/2023 13:47    ASSESSMENT & PLAN:   Assessment & Plan by Problem: Principal Problem:   Acute renal failure due to rhabdomyolysis [facility]) Active Problems:   DVT, lower extremity, distal, acute, bilateral (HCC)   Bipolar 2 disorder (HCC)   Asymptomatic HIV infection, CD4 >=500 (HCC)   Anxiety   Moderate recurrent major depression (HCC)   Injection of illicit drug within last 12 months   Methamphetamine dependence (HCC)   Hyponatremia   High transaminase levels   Electrolyte disturbance  Acute  renal failure Rhabdomyolysis Elevated BUN Anion gap metabolic acidosis Nephrology on board, appreciate their input.  Challenging situation.  Patient presenting with acute renal failure likely in the setting of rhabdomyolysis.  Volume status assessment complicated by bilateral DVTs.  No JVD, crackles, ascites on CT to suggest significant third spacing. Patient does endorse ongoing oliguria, says that his urine was a dark cola color at first but has been getting lighter.  CK greatly elevated greater than 50k, BUN greatly elevated in the 120s.  At this time no signs of nausea, vomiting, encephalopathy from uremia.  Per nephrology, concerned that he may require dialysis during this admission. Plan: -  Bicarb 200 mL/h per nephrology -IV Lasix 80 mg per nephrology  -Little urine output with first dose, may hold second dose for now as creatinine increasing  -Underwhelming output with IV Lasix  - Will add IV albumin -Strict ins and outs - Serial CMP - Urine electrolytes studies  Hyponatremia Hyponatremic to 123 on admit, 125 now.  Etiology not altogether clear given patient's risk acute renal failure, lower extremity edema, DVTs, oliguria.  Wonder if in part prerenal as Plan: - Careful attention to avoid overcorrection, osmotic demyelination.  Hyperkalemia Improved since admission, 6.5 on admit now 5.4., temporized with albuterol, insulin and D50, Lokelma.  Increased T wave amplitude on admission EKG.  Given calcium gluconate. Plan: - Continue management per nephrology -Repeat EKG  Hyperphosphatemia Hypocalcemia Likely in the setting of muscle breakdown acute renal failure. Plan: - Trend chemistries - Phosphate binders  Elevated liver enzymes Again unclear etiology, patient denies recent alcohol use.  No significant right upper quadrant pain, right upper quadrant ultrasound relatively unremarkable.  No nausea, vomiting at this time.  Do wonder about ischemic etiology during immobilization,  though patient denies any lightheadedness, dizziness following recent methamphetamine misadventure.  Plan: - Viral hepatitis panel -Right upper quadrant normal -Trend CMP  Bilateral lower extremity DVT Intact peripheral pulses, lessening concern for compartment syndrome at this time though CK is very elevated.  Favored to be secondary to prolonged immobilization.  Bilateral right worse than left DVTs, curbside discussion with vascular surgery.  Recommended heparinization at this time, unlikely acute role for thrombectomy. Plan: - Heparin per pharmacy - Close attention to peripheral pulses - Dilaudid p.o./IV as needed  Leukocytosis Unclear etiology at this time, received broad-spectrum antibiotics in ED.  Afebrile, blood cultures pending, will continue to trend CBC. UA without signs of infection and lung imaging without signs of PNA, so will dc abx and monitor clinically.   Lactic acidosis 2.0 on admission, resolved on repeat.  Methamphetamine use Would benefit from [facility] support, additional resources when nearing discharge/outpatient. -UDS positive for amphetamines, do wonder if his drugs may have been laced with fentany or other substance.  HIV Chronic. 02/25 labs show undetectable RNA and CD4 count >800. On Biktarvy. Holding given ARF.  -Restart Biktarvy when able.   Bipolar Disorder:  Chronic. Pt on Aristada with last injection 2 weeks ago. Pt has been on this therapy for >3 years so unlikely the etiology of his presentation.   Thoracic Outlet Syndrome Chronic. Holding home gabapentin and duloxetine given ARF. Will restart when able.   Addendum: Patient reassessed at 0300, nursing staff to collect repeat BMP. Patient states he has been anuric since original assessment.  Now endorsing some orthopnea/shortness of breath and possible midsternal pleuritic chest pain.  Slight fine crackles present on auscultation of the lower lung bases, but overall pulmonary exam similar to prior. CT  PE study contraindicated due to renal function.  May benefit from a VQ scan, but he is already on treatment dose anticoagulation.  Saturating well on room air, no tachycardia during exam.  Blood pressure within normal limits.  No new nausea, vomiting, confusion.  Persistently alert and oriented.  No asterixis on exam.  Understandably concerned about prognosis.  Diet: Renal VTE: Heparin (treatment dose) IVF:  Bicarb , 200 cc/h Code: Full  Prior to Admission Living Arrangement:  Home living with roommate Anticipated Discharge Location: Home Barriers to Discharge: Clinical improvement  Dispo: Admit patient to Inpatient with expected length of stay greater than 2 midnights.  Signed: Barkley Boot  Rexene Catching, MD Internal Medicine Resident PGY-1  11/25/2023, 11:43 AM

## 2023-11-25 ENCOUNTER — Inpatient Hospital Stay (HOSPITAL_COMMUNITY): Payer: Medicare (Managed Care)

## 2023-11-25 ENCOUNTER — Encounter (HOSPITAL_COMMUNITY): Payer: Self-pay | Admitting: Internal Medicine

## 2023-11-25 DIAGNOSIS — R9431 Abnormal electrocardiogram [ECG] [EKG]: Secondary | ICD-10-CM | POA: Diagnosis not present

## 2023-11-25 DIAGNOSIS — E875 Hyperkalemia: Secondary | ICD-10-CM | POA: Insufficient documentation

## 2023-11-25 DIAGNOSIS — G540 Brachial plexus disorders: Secondary | ICD-10-CM

## 2023-11-25 DIAGNOSIS — R7401 Elevation of levels of liver transaminase levels: Secondary | ICD-10-CM | POA: Diagnosis present

## 2023-11-25 DIAGNOSIS — E8721 Acute metabolic acidosis: Secondary | ICD-10-CM

## 2023-11-25 DIAGNOSIS — I824Z2 Acute embolism and thrombosis of unspecified deep veins of left distal lower extremity: Secondary | ICD-10-CM

## 2023-11-25 DIAGNOSIS — M6282 Rhabdomyolysis: Secondary | ICD-10-CM | POA: Diagnosis not present

## 2023-11-25 DIAGNOSIS — I82409 Acute embolism and thrombosis of unspecified deep veins of unspecified lower extremity: Secondary | ICD-10-CM | POA: Diagnosis present

## 2023-11-25 DIAGNOSIS — B20 Human immunodeficiency virus [HIV] disease: Secondary | ICD-10-CM

## 2023-11-25 DIAGNOSIS — E878 Other disorders of electrolyte and fluid balance, not elsewhere classified: Secondary | ICD-10-CM

## 2023-11-25 DIAGNOSIS — E871 Hypo-osmolality and hyponatremia: Secondary | ICD-10-CM

## 2023-11-25 DIAGNOSIS — R7989 Other specified abnormal findings of blood chemistry: Secondary | ICD-10-CM

## 2023-11-25 DIAGNOSIS — N179 Acute kidney failure, unspecified: Secondary | ICD-10-CM | POA: Diagnosis not present

## 2023-11-25 DIAGNOSIS — D72829 Elevated white blood cell count, unspecified: Secondary | ICD-10-CM

## 2023-11-25 DIAGNOSIS — F3181 Bipolar II disorder: Secondary | ICD-10-CM

## 2023-11-25 DIAGNOSIS — I824Z3 Acute embolism and thrombosis of unspecified deep veins of distal lower extremity, bilateral: Secondary | ICD-10-CM

## 2023-11-25 DIAGNOSIS — F1590 Other stimulant use, unspecified, uncomplicated: Secondary | ICD-10-CM

## 2023-11-25 HISTORY — DX: Hyperkalemia: E87.5

## 2023-11-25 HISTORY — DX: Hypo-osmolality and hyponatremia: E87.1

## 2023-11-25 HISTORY — DX: Other disorders of electrolyte and fluid balance, not elsewhere classified: E87.8

## 2023-11-25 LAB — BASIC METABOLIC PANEL WITH GFR
Anion gap: 25 — ABNORMAL HIGH (ref 5–15)
Anion gap: 23 — ABNORMAL HIGH (ref 5–15)
Anion gap: 24 — ABNORMAL HIGH (ref 5–15)
Anion gap: 24 — ABNORMAL HIGH (ref 5–15)
Anion gap: 23 — ABNORMAL HIGH (ref 5–15)
BUN: 132 mg/dL — ABNORMAL HIGH (ref 6–20)
BUN: 139 mg/dL — ABNORMAL HIGH (ref 6–20)
BUN: 141 mg/dL — ABNORMAL HIGH (ref 6–20)
BUN: 143 mg/dL — ABNORMAL HIGH (ref 6–20)
BUN: 144 mg/dL — ABNORMAL HIGH (ref 6–20)
CO2: 16 mmol/L — ABNORMAL LOW (ref 22–32)
CO2: 15 mmol/L — ABNORMAL LOW (ref 22–32)
CO2: 19 mmol/L — ABNORMAL LOW (ref 22–32)
CO2: 19 mmol/L — ABNORMAL LOW (ref 22–32)
CO2: 21 mmol/L — ABNORMAL LOW (ref 22–32)
Calcium: 6.2 mg/dL — CL (ref 8.9–10.3)
Calcium: 5.9 mg/dL — CL (ref 8.9–10.3)
Calcium: 6 mg/dL — CL (ref 8.9–10.3)
Calcium: 6 mg/dL — CL (ref 8.9–10.3)
Calcium: 5.8 mg/dL — CL (ref 8.9–10.3)
Chloride: 86 mmol/L — ABNORMAL LOW (ref 98–111)
Chloride: 87 mmol/L — ABNORMAL LOW (ref 98–111)
Chloride: 83 mmol/L — ABNORMAL LOW (ref 98–111)
Chloride: 81 mmol/L — ABNORMAL LOW (ref 98–111)
Chloride: 81 mmol/L — ABNORMAL LOW (ref 98–111)
Creatinine, Ser: 11.45 mg/dL — ABNORMAL HIGH (ref 0.61–1.24)
Creatinine, Ser: 11.58 mg/dL — ABNORMAL HIGH (ref 0.61–1.24)
Creatinine, Ser: 11.69 mg/dL — ABNORMAL HIGH (ref 0.61–1.24)
Creatinine, Ser: 12.1 mg/dL — ABNORMAL HIGH (ref 0.61–1.24)
Creatinine, Ser: 12.1 mg/dL — ABNORMAL HIGH (ref 0.61–1.24)
GFR, Estimated: 5 mL/min — ABNORMAL LOW (ref >60)
GFR, Estimated: 5 mL/min — ABNORMAL LOW (ref >60)
GFR, Estimated: 5 mL/min — ABNORMAL LOW (ref >60)
GFR, Estimated: 5 mL/min — ABNORMAL LOW (ref >60)
GFR, Estimated: 5 mL/min — ABNORMAL LOW (ref >60)
Glucose, Bld: 96 mg/dL (ref 70–99)
Glucose, Bld: 132 mg/dL — ABNORMAL HIGH (ref 70–99)
Glucose, Bld: 110 mg/dL — ABNORMAL HIGH (ref 70–99)
Glucose, Bld: 108 mg/dL — ABNORMAL HIGH (ref 70–99)
Glucose, Bld: 110 mg/dL — ABNORMAL HIGH (ref 70–99)
Potassium: 4.8 mmol/L (ref 3.5–5.1)
Potassium: 4.6 mmol/L (ref 3.5–5.1)
Potassium: 4.6 mmol/L (ref 3.5–5.1)
Potassium: 4.4 mmol/L (ref 3.5–5.1)
Potassium: 4.1 mmol/L (ref 3.5–5.1)
Sodium: 127 mmol/L — ABNORMAL LOW (ref 135–145)
Sodium: 125 mmol/L — ABNORMAL LOW (ref 135–145)
Sodium: 126 mmol/L — ABNORMAL LOW (ref 135–145)
Sodium: 124 mmol/L — ABNORMAL LOW (ref 135–145)
Sodium: 125 mmol/L — ABNORMAL LOW (ref 135–145)

## 2023-11-25 LAB — ECHOCARDIOGRAM COMPLETE
Area-P 1/2: 4.36 cm2
Height: 63 in
S' Lateral: 2.7 cm
Weight: 2720 [oz_av]

## 2023-11-25 LAB — CBC
HCT: 35.3 % — ABNORMAL LOW (ref 39.0–52.0)
Hemoglobin: 12.9 g/dL — ABNORMAL LOW (ref 13.0–17.0)
MCH: 30.7 pg (ref 26.0–34.0)
MCHC: 36.5 g/dL — ABNORMAL HIGH (ref 30.0–36.0)
MCV: 84 fL (ref 80.0–100.0)
Platelets: 216 10*3/uL (ref 150–400)
RBC: 4.2 MIL/uL — ABNORMAL LOW (ref 4.22–5.81)
RDW: 12.7 % (ref 11.5–15.5)
WBC: 13.4 10*3/uL — ABNORMAL HIGH (ref 4.0–10.5)
nRBC: 0 % (ref 0.0–0.2)

## 2023-11-25 LAB — CK: Total CK: >50000 U/L — ABNORMAL HIGH (ref 49–397)

## 2023-11-25 LAB — HEPATITIS PANEL, ACUTE
HCV Ab: NONREACTIVE
Hep A IgM: NONREACTIVE
Hep B C IgM: NONREACTIVE
Hepatitis B Surface Ag: NONREACTIVE

## 2023-11-25 LAB — URINE CULTURE: Culture: NO GROWTH

## 2023-11-25 LAB — HEPARIN LEVEL (UNFRACTIONATED)
Heparin Unfractionated: 0.1 [IU]/mL — ABNORMAL LOW (ref 0.30–0.70)
Heparin Unfractionated: 0.22 [IU]/mL — ABNORMAL LOW (ref 0.30–0.70)

## 2023-11-25 LAB — PHOSPHORUS: Phosphorus: 10.2 mg/dL — ABNORMAL HIGH (ref 2.5–4.6)

## 2023-11-25 MED ORDER — HEPARIN BOLUS VIA INFUSION
3000.0000 [IU] | Freq: Once | INTRAVENOUS | Status: AC
  Administered 2023-11-25: 3000 [IU] via INTRAVENOUS
  Filled 2023-11-25: qty 3000

## 2023-11-25 MED ORDER — CALCIUM GLUCONATE-NACL 1-0.675 GM/50ML-% IV SOLN
1.0000 g | Freq: Once | INTRAVENOUS | Status: AC
  Administered 2023-11-25: 1000 mg via INTRAVENOUS
  Filled 2023-11-25 (×2): qty 50

## 2023-11-25 MED ORDER — FUROSEMIDE 10 MG/ML IJ SOLN
80.0000 mg | Freq: Two times a day (BID) | INTRAMUSCULAR | Status: DC
  Administered 2023-11-25: 80 mg via INTRAVENOUS
  Filled 2023-11-25 (×2): qty 8

## 2023-11-25 MED ORDER — NITROGLYCERIN 0.4 MG SL SUBL
0.4000 mg | SUBLINGUAL_TABLET | SUBLINGUAL | Status: DC | PRN
  Administered 2023-11-25: 0.4 mg via SUBLINGUAL

## 2023-11-25 MED ORDER — FUROSEMIDE 10 MG/ML IJ SOLN
120.0000 mg | Freq: Two times a day (BID) | INTRAVENOUS | Status: DC
  Administered 2023-11-25 – 2023-12-03 (×15): 120 mg via INTRAVENOUS
  Filled 2023-11-25 (×2): qty 10
  Filled 2023-11-25 (×2): qty 2
  Filled 2023-11-25 (×6): qty 10
  Filled 2023-11-25 (×3): qty 12
  Filled 2023-11-25 (×2): qty 120
  Filled 2023-11-25 (×2): qty 2

## 2023-11-25 MED ORDER — CALCIUM GLUCONATE-NACL 1-0.675 GM/50ML-% IV SOLN
1.0000 g | Freq: Once | INTRAVENOUS | Status: AC
  Administered 2023-11-25: 1000 mg via INTRAVENOUS
  Filled 2023-11-25: qty 50

## 2023-11-25 MED ORDER — HEPARIN BOLUS VIA INFUSION
1000.0000 [IU] | Freq: Once | INTRAVENOUS | Status: AC
  Administered 2023-11-25: 1000 [IU] via INTRAVENOUS
  Filled 2023-11-25: qty 1000

## 2023-11-25 MED ORDER — NITROGLYCERIN 0.4 MG SL SUBL
SUBLINGUAL_TABLET | SUBLINGUAL | Status: AC
  Filled 2023-11-25: qty 1

## 2023-11-25 MED ORDER — FUROSEMIDE 10 MG/ML IJ SOLN
80.0000 mg | Freq: Once | INTRAMUSCULAR | Status: AC
  Administered 2023-11-25: 80 mg via INTRAVENOUS
  Filled 2023-11-25: qty 8

## 2023-11-25 NOTE — Progress Notes (Addendum)
 PHARMACY - ANTICOAGULATION CONSULT NOTE  Pharmacy Consult for heparin Indication: DVT  Labs: Recent Labs    11/24/23 1300 11/24/23 1333 11/24/23 1740 11/24/23 1952 11/25/23 0325 11/25/23 0550  HGB 15.2 15.0  --   --  12.9*  --   HCT 42.2 44.0  --   --  35.3*  --   PLT 271  --   --   --  216  --   HEPARINUNFRC  --   --   --   --   --  0.10*  CREATININE 10.81* 11.70* 10.95* 11.11* 11.45*  --   CKTOTAL >50,000*  --   --   --   --   --    Assessment: 42yo male subtherapeutic on heparin with initial dosing for DVT; no infusion issues or signs of bleeding per RN though noted that Hgb is down to 12.9.  Goal of Therapy:  Heparin level 0.3-0.7 units/ml   Plan:  3000 units heparin bolus. Increase heparin infusion by 4 units/kg/hr to 1600 units/hr. Check level in 8 hours.   Lonnie Roberts, PharmD, BCPS 11/25/2023 6:56 AM

## 2023-11-25 NOTE — Plan of Care (Signed)
  Problem: Clinical Measurements: Goal: Ability to maintain clinical measurements within normal limits will improve Outcome: Progressing   Problem: Safety: Goal: Ability to remain free from injury will improve Outcome: Progressing   Problem: Skin Integrity: Goal: Risk for impaired skin integrity will decrease Outcome: Progressing   Problem: Pain Managment: Goal: General experience of comfort will improve and/or be controlled Outcome: Progressing   Problem: Elimination: Goal: Will not experience complications related to bowel motility Outcome: Progressing

## 2023-11-25 NOTE — Progress Notes (Signed)
 Echocardiogram 2D Echocardiogram has been performed.  Emmaline Haring Jacqueline Delapena RDCS 11/25/2023, 11:21 AM

## 2023-11-25 NOTE — Progress Notes (Signed)
 Interval history Overall the patient  is stable since yesterday, no improvement. Today the patient reports his legs are 7-8 out of 10 pain and he still continues to be stiff and unable to walk, he also reports weakness in both legs and restlessness, stating that they feel mildly better when he moves them. Dilaudid helps with the pain.  He also reports intermittent blurred vision that started yesterday in both eyes, denies diplopia. He continues to note pleuritis chest pain and difficulty catching his breath. He was able to urinate only a small amount after IV lasix today. His urine is yellow. He denies back pain, headaches fever, chills, or abdominal pain.   Discussed that this is highly unlikely compartment syndrome and he may potentially start dialysis.  Explained risks and benefits.  Physical exam Blood pressure 115/77, pulse 88, temperature 97.8 F (36.6 C), temperature source Oral, resp. rate 20, height 5\' 3"  (1.6 m), weight 77.1 kg, SpO2 100%.  General: Lying down in bed Cardiovascular: regular rate and rhythm, no m/r/g Pulmonary/Chest: Shallow respirations, lungs clear to auscultation bilaterally.  No crackles or wheezes.  Extremities: Bilateral right greater than left lower extremity edema, 3+ above the knees.  Intact dorsalis pedis and posterior tibialis pulses of the lower extremities bilaterally. Skin: bullae appreciated at dorsal aspect of R calf. Erythema of b/l LEs Neurological: Alert and oriented Psych: Concerned mood, in mild distress after discussion of plan  Weight change:    Intake/Output Summary (Last 24 hours) at 11/25/2023 1117 Last data filed at 11/25/2023 0900 Gross per 24 hour  Intake 198.57 ml  Output 250 ml  Net -51.43 ml   Net IO Since Admission: -51.43 mL [11/25/23 1117]  Labs, images, and other studies    Latest Ref Rng & Units 11/25/2023    3:25 AM 11/24/2023    1:33 PM 11/24/2023    1:00 PM  CBC  WBC 4.0 - 10.5 K/uL 13.4   18.9    Hemoglobin 13.0 - 17.0 g/dL 16.1  09.6  04.5   Hematocrit 39.0 - 52.0 % 35.3  44.0  42.2   Platelets 150 - 400 K/uL 216   271       Latest Ref Rng & Units 11/25/2023    7:36 AM 11/25/2023    3:25 AM 11/24/2023    7:52 PM  CMP  Glucose 70 - 99 mg/dL 409  96  94   BUN 6 - 20 mg/dL 811  914  782   Creatinine 0.61 - 1.24 mg/dL 95.62  13.08  65.78   Sodium 135 - 145 mmol/L 125  127  125   Potassium 3.5 - 5.1 mmol/L 4.6  4.8  5.4   Chloride 98 - 111 mmol/L 87  86  90   CO2 22 - 32 mmol/L 15  16  12    Calcium 8.9 - 10.3 mg/dL 5.9  6.2  5.9    Phosphorous 10.2  CK > 50k  Heparin unfractionated level: 0.2  EKG 4/14: peaked T waves consistent with hyperkalemia   Assessment and plan Hospital day 1  Kenneth Hunt is a 43 y.o. [name] is a 43 year old male with past medical history of bipolar 2 disorder, HIV, anxiety, depression, thoracic outlet syndrome, IV drug use (methamphetamine), most recent use 3 days ago who is presented to the ED for LE swelling/pain and oliguria 2/2 to rhabdomyolysis complicated by acute renal failure  with high anion gap metabolic acidosis and electrolyte abnormalities.   Principal Problem: Acute renal failure 2/2 rhabdomyolysis Anion gap metabolic acidosis Stable. His oliguria has persisted, CK remains elevated. As diuretics have not yet been very effective in helping him to urinate, he will likely need hemodialysis. No signs of encephalopathy.  - Trend BMP - Strict I/Os - Bicarb 223ml/h per nephrology - Appreciate nephrology recommendations  - IV Lasix 80mg  q12h  Hyperkalemia 6.5 on admission -->4.6 today. Improved after receiving D50 and 5 units insulin, IV Lasix 80mg  x 2. Peaked T waves on EKG.  - EKG daily  - S/p calcium gluconate 1000mg  x 2  Hyponatremia Na 125 this am. Likely 2/2 to decreased po intake. Might be cause of his blurred vision. Will monitor for seizure activity   - Trend BMP - S/p 1L NS bolus, LR infusion in the  ED - Careful attention to avoid overcorrection and osmotic demyelination   Acute bilateral LEE deep vein thromboses  DVT ultrasound shows bilateral DVTs of proximal to mid left posterior tibial veins, and left peroneal veins. Pleuritic chest pain persists since yesterday, CTA performed showed no evidence of PE. As DVTs are limited to vasculature distal to the knee, overall lower risk of embolization.  - Treatment dose heparin per pharmacy - Close attention to peripheral pulses - Dilaudid p.o./IV as needed  Elevated liver enzymes AST 1578, ALT 688. The breakdown of myocytes in rhabdomyolysis can cause elevated liver enzymes in a 2:1 AST:ALT ratio. No symptoms of hepatic encephalopathy. Will not routinely follow up LFTs at this time. - Viral hepatitis panel  Leukocytosis  18.9--> 13.4. Likely elevated in the setting of acute inflammation.  - Trend CBC  Bipolar 2 disorder Chronic. Pt on long-acting injectable aripiprazole (Aristada) for over 3 years.  - Patient recently received Aristada injection. No inpatient management at this time  HIV infection, CD4 >=800 Chronic. 02/25 labs show undetectable RNA and CD4 count >800. On Biktarvy. Holding given ARF.  -Restart Biktarvy when able.   Methamphetamine use, Injection of illicit drug within last 12 months - TOC follow up when nearing discahage   Thoracic Outlet Syndrome Chronic. Holding home gabapentin and duloxetine given ARF. Will restart when able.   VTE prophylaxis: IV heparin drip  Diet: Normal diet IVF: none Code: Full  PT/OT recommendations: PT prohibited at this time due to DVTs   This is a Psychologist, occupational Note.  The care of the patient was discussed with Dr. Esaw Heckler and the assessment and plan was formulated with their assistance.  Please see their note for official documentation of the patient encounter.   Signed: Jere Monaco, Medical Student 11/25/2023, 10:27 AM    I have seen and examined the patient myself, and I  have reviewed the note by Alfornia Imam, MS3 and was present during the interview and physical exam.    Signed: Jayson Michael, MD Internal Medicine Resident, PGY-1

## 2023-11-25 NOTE — Progress Notes (Incomplete)
 Received message from nursing regarding patient concerns of chest pain.  EKG was obtained and albuterol was given per nursing protocol, without much relief.  EKG similar to prior, T amplitude decreased from prior.  Patient examined at bedside, endorsing worsening dyspnea, chest tightness, and some pleuritic chest pain.  I do see that his oxygen saturations have been low throughout the day, he is now on 3 L nasal cannula saturating in the mid 90s.  Patient turned down to to room air, saturating 88 to 92% on room air.  On exam, worsening peripheral edema.  Previously 3+ to the knees bilaterally, new dependent edema appreciated in the thighs.  Bilateral crackles more pronounced than prior.  Chest x-ray obtained demonstrating vascular congestion with increased left basilar atelectasis.  Patient feels like he cannot get a deep breath.  He is persistently alert and oriented x 4, though he has more pronounced cognitive slowing compared to prior.  He does have some increased somnolence and is sleepy at times during examination.  Mild asterixis noted.  Differential would include PE given known DVTs, uremic peri-carditis, atelectasis, and volume overload.  Given exam findings, most likely related to volume overload.  Urine output continues to be suboptimal, condom cath in place with minimal urine.  Bladder scan was obtained which showed about 130 mL of urine in the bladder.    Plan: Decrease rate of bicarbonate drip to 75 mL/h given worsening volume status, respiratory status.  Will push additional 80 mg IV Lasix in hopes of diuresis.  Will obtain repeat stat BMP to reassess BUN which has continued to climb during this admission.  I believe he likely will need dialysis given exam findings should these efforts fail to produce adequate diuresis.

## 2023-11-25 NOTE — Progress Notes (Signed)
 OT Cancellation Note  Patient Details Name: Kenneth Hunt MRN: [id number] DOB: 1981-03-11   Cancelled Treatment:    Reason Eval/Treat Not Completed: Medical issues which prohibited therapy;Patient not medically ready. Pt with DVT. Heparin started 4/13, not yet therapeutic or greater than 24 hours. Will continue to follow.   Jonette Nestle 11/25/2023, 8:25 AM Avanell Leigh, OTR/L Acute Rehabilitation Services Office: [phone]

## 2023-11-25 NOTE — Care Management (Signed)
 Transition of Care [facility]) - Inpatient Brief Assessment   Patient Details  Name: Kenneth Hunt MRN: [id number] Date of Birth: Dec 14, 1980  Transition of [facility]) CM/SW Contact:    Ronni Colace, RN Phone Number: 11/25/2023, 11:50 AM   Clinical Narrative: Presented with leg swelling. Pneumonia/atelectasis. Patient has insurance, recently smoked meth SA resources added to AVS. No further needs at this time.  The patient will be discussed in daily progressive rounds. If a need is identified please place a TOC consult.   Transition of Care Asessment: Insurance and Status: Insurance coverage has been reviewed Patient has primary care physician: Yes Home environment has been reviewed: single family hoe Prior level of function:: Independent Prior/Current Home Services: No current home services Social Drivers of Health Review: SDOH reviewed no interventions necessary Readmission risk has been reviewed: Yes Transition of care needs: no transition of care needs at this time

## 2023-11-25 NOTE — Discharge Instructions (Addendum)
 Intensive Outpatient Programs  [facility]    [facility] [address]     [address] [place],  [place]     [place], [place] [phone]      [phone]  [facility]   [facility]  (Inpatient and outpatient)  [phone] (Suboxone and Methadone) [address]           [phone]          [facility]    [facility] - Intensive Outpatient [address]     [address] [place], [place] [address]     [place], [place]  [phone]      [phone]  [facility] (Outpatient, Inpatient, [facility]  [facility] (Groups and Residental) (insurance only) [phone]    [place], [place]          [phone]       [facility]    [facility] (for caregivers and family) [address]     [address] [place], [place]     [place], [place] [phone]      [phone]  Residential Treatment Programs  [facility]  [place](2 years) Residential: 90 days)  [facility] [address] [facility]      [address] [place], [place]     [place], [place] [phone]      [phone] or [phone]  [facility]    [facility] [address]      [address] [place], [place]     [place], [place] [phone]      [phone]  [facility]   [facility] ([facility]) [address]     [address] [place], [place] [address]     [place], [place] [phone]      [phone] Admissions: 8am-3pm M-F  [facility]: Residential Program [phone] Days)              [facility]: [place]  [facility]  [place], [place]     [place], [place]  [phone] or [phone]    (Walk in Hours over the weekend or by referral)   [facility]: [facility]:[phone] (for crisis response 24  hours a day)  Information on my medicine - ELIQUIS  (apixaban )  This medication education was reviewed with me or my healthcare representative as part of my discharge preparation.   Why was Eliquis  prescribed for you? Eliquis  was prescribed to treat blood clots that may have been found in the veins of your legs (deep vein thrombosis) or in your lungs (pulmonary embolism) and to reduce the risk of them occurring again.  What do You need to know about Eliquis  ? The  dose is ONE 5 mg tablet taken TWICE daily.  Eliquis  may be taken with or without food.   Try to take the dose about the same time in the morning and in the evening. If you have difficulty swallowing the tablet whole please discuss with your pharmacist how to take the medication safely.  Take Eliquis  exactly as prescribed and DO NOT stop taking Eliquis  without talking to the doctor who prescribed the medication.  Stopping may increase your risk of developing a new blood clot.  Refill your prescription before you run out.  After discharge, you should have regular check-up appointments with your healthcare provider that is prescribing  your Eliquis .    What do you do if you miss a dose? If a dose of ELIQUIS  is not taken at the scheduled time, take it as soon as possible on the same day and twice-daily administration should be resumed. The dose should not be doubled to make up for a missed dose.  Important Safety Information A possible side effect of Eliquis  is bleeding. You should call your healthcare provider right away if you experience any of the following: Bleeding from an injury or your nose that does not stop. Unusual colored urine (red or dark brown) or unusual colored stools (red or black). Unusual bruising for unknown reasons. A serious fall or if you hit your head (even if there is no bleeding).  Some medicines may interact with Eliquis  and might increase your risk of bleeding or clotting while on Eliquis . To  help avoid this, consult your healthcare provider or pharmacist prior to using any new prescription or non-prescription medications, including herbals, vitamins, non-steroidal anti-inflammatory drugs (NSAIDs) and supplements.  This website has more information on Eliquis  (apixaban ): http://www.eliquis .com/eliquis /home  ==========================================  Deep Vein Thrombosis    Deep vein thrombosis (DVT) is a condition in which a blood clot forms in a deep vein, such as a lower leg, thigh, or arm vein. A clot is blood that has thickened into a gel or solid. This condition is dangerous. It can lead to serious and even life-threatening complications if the clot travels to the lungs and causes a blockage (pulmonary embolism). It can also damage veins in the leg. This can result in leg pain, swelling, discoloration, and sores (post-thrombotic syndrome).  What are the causes? This condition may be caused by: A slowdown of blood flow. Damage to a vein. A condition that causes blood to clot more easily, such as an inherited clotting disorder.  What increases the risk? The following factors may make you more likely to develop this condition: Being overweight. Being older, especially over age 77. Sitting or lying down for more than four hours. Being in the hospital. Lack of physical activity (sedentary lifestyle). Pregnancy, being in childbirth, or having recently given birth. Taking medicines that contain estrogen, such as medicines to prevent pregnancy. Smoking. A history of any of the following: Blood clots or a blood clotting disease. Peripheral vascular disease. Inflammatory bowel disease. Cancer. Heart disease. Genetic conditions that affect how your blood clots, such as Factor V Leiden mutation. Neurological diseases that affect your legs (leg paresis). A recent injury, such as a car accident. Major or lengthy surgery. A central line placed inside a large vein.  What are  the signs or symptoms? Symptoms of this condition include: Swelling, pain, or tenderness in an arm or leg. Warmth, redness, or discoloration in an arm or leg. If the clot is in your leg, symptoms may be more noticeable or worse when you stand or walk. Some people may not develop any symptoms.  How is this diagnosed? This condition is diagnosed with: A medical history and physical exam. Tests, such as: Blood tests. These are done to check how well your blood clots. Ultrasound. This is done to check for clots. Venogram. For this test, contrast dye is injected into a vein and X-rays are taken to check for any clots  How is this treated? Treatment for this condition depends on: The cause of your DVT. Your risk for bleeding or developing more clots. Any other medical conditions that you have. Treatment may include: Taking a blood thinner (anticoagulant). This type  of medicine prevents clots from forming. It may be taken by mouth, injected under the skin, or injected through an IV (catheter). Injecting clot-dissolving medicines into the affected vein (catheter-directed thrombolysis). Having surgery. Surgery may be done to: Remove the clot. Place a filter in a large vein to catch blood clots before they reach the lungs. Some treatments may be continued for up to six months.  Follow these instructions at home: If you are taking blood thinners: Take the medicine exactly as told by your health care provider. Some blood thinners need to be taken at the same time every day. Do not skip a dose. Talk with your health care provider before you take any medicines that contain aspirin or NSAIDs. These medicines increase your risk for dangerous bleeding. Ask your health care provider about foods and drugs that could change the way the medicine works (may interact). Avoid those things if your health care provider tells you to do so. Blood thinners can cause easy bruising and may make it difficult to stop  bleeding. Because of this: Be very careful when using knives, scissors, or other sharp objects. Use an electric razor instead of a blade. Avoid activities that could cause injury or bruising, and follow instructions about how to prevent falls. Wear a medical alert bracelet or carry a card that lists what medicines you take.  General instructions Take over-the-counter and prescription medicines only as told by your health care provider. Return to your normal activities as told by your health care provider. Ask your health care provider what activities are safe for you. Wear compression stockings if recommended by your health care provider. Keep all follow-up visits as told by your health care provider. This is important.  How is this prevented? To lower your risk of developing this condition again: For 30 or more minutes every day, do an activity that: Involves moving your arms and legs. Increases your heart rate. When traveling for longer than four hours: Exercise your arms and legs every hour. Drink plenty of water . Avoid drinking alcohol. Avoid sitting or lying for a long time without moving your legs. If you have surgery or you are hospitalized, ask about ways to prevent blood clots. These may include taking frequent walks or using anticoagulants. Stay at a healthy weight. If you are a woman who is older than age 75, avoid unnecessary use of medicines that contain estrogen, such as some birth control pills. Do not use any products that contain nicotine or tobacco, such as cigarettes and e-cigarettes. This is especially important if you take estrogen medicines. If you need help quitting, ask your health care provider.  Contact a health care provider if: You miss a dose of your blood thinner. Your menstrual period is heavier than usual. You have unusual bruising.  Get help right away if: You have: New or increased pain, swelling, or redness in an arm or leg. Numbness or tingling  in an arm or leg. Shortness of breath. Chest pain. A rapid or irregular heartbeat. A severe headache or confusion. A cut that will not stop bleeding. There is blood in your vomit, stool, or urine. You have a serious fall or accident, or you hit your head. You feel light-headed or dizzy. You cough up blood.  These symptoms may represent a serious problem that is an emergency. Do not wait to see if the symptoms will go away. Get medical help right away. Call your local emergency services (911 in the [place] Do not drive yourself to  the hospital. Summary Deep vein thrombosis (DVT) is a condition in which a blood clot forms in a deep vein, such as a lower leg, thigh, or arm vein. Symptoms can include swelling, warmth, pain, and redness in your leg or arm. This condition may be treated with a blood thinner (anticoagulant medicine), medicine that is injected to dissolve blood clots,compression stockings, or surgery. If you are prescribed blood thinners, take them exactly as told. This information is not intended to replace advice given to you by your health care provider. Make sure you discuss any questions you have with your health care provider. Document Revised: 07/12/2017 Document Reviewed: 12/28/2016 Elsevier Patient Education  2020 [employer].    Kenneth Hunt,   Kenneth Hunt were hospitalized for acute renal failure due to rhabdomyolysis. At this time your kidney function has improved enough that I feel comfortable discharging you with close outpatient follow up. Thank you for allowing us  to be part of your care.   We arranged for you to follow up at: [facility] on 12/17/2023 10:15 AM   Please note these changes made to your medications:   *Please START taking:  Eliquis  5mg  twice dailly  *Please STOP taking:  Duloxetine  until restarted by your PCP  Please make sure to return to the hospital if you are not making urine, if you have worsening leg pain and swelling,  or if you are having severe shortness of breath.   Please call our clinic if you have any questions or concerns, we may be able to help and keep you from a long and expensive emergency room wait. Our clinic and after hours phone number is [phone], the best time to call is Monday through Friday 9 am to 4 pm but there is always someone available 24/7 if you have an emergency. If you need medication refills please notify your pharmacy one week in advance and they will send us  a request.

## 2023-11-25 NOTE — Progress Notes (Signed)
 ANTICOAGULATION CONSULT NOTE  Pharmacy Consult for Heparin Indication: DVT  Allergies  Allergen Reactions   Haloperidol Other (See Comments)    Tardive dyskinesia    Patient Measurements: Height: 5\' 3"  (160 cm) Weight: 77.1 kg (170 lb) IBW/kg (Calculated) : 56.9 Heparin Dosing Weight: 72.9 kg  Vital Signs: Temp: 98.1 F (36.7 C) (04/14 1618) Temp Source: Oral (04/14 1618) BP: 125/63 (04/14 1618) Pulse Rate: 92 (04/14 1618)  Labs: Recent Labs    11/24/23 1300 11/24/23 1333 11/24/23 1740 11/25/23 0325 11/25/23 0550 11/25/23 0736 11/25/23 1318 11/25/23 1723  HGB 15.2 15.0  --  12.9*  --   --   --   --   HCT 42.2 44.0  --  35.3*  --   --   --   --   PLT 271  --   --  216  --   --   --   --   HEPARINUNFRC  --   --   --   --  0.10*  --   --  0.22*  CREATININE 10.81* 11.70*   < > 11.45*  --  11.58* 11.69*  --   CKTOTAL >50,000*  --   --   --   --  >50,000*  --   --    < > = values in this interval not displayed.    Estimated Creatinine Clearance: 7.4 mL/min (A) (by C-G formula based on SCr of 11.69 mg/dL (H)).   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 MRI brain and cervical spine showed bilateral neural foraminal cysts C4-C5, C5-C6, C6-C7, brain within normal limits.  - continue cymbalta, flexeril, neurontin - f/u with Dr. 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   Past Week   bictegravir-emtricitabine-tenofovir AF (BIKTARVY)  50-200-25 MG TABS tablet Take 1 tablet by mouth daily. 30 tablet 11 11/22/2023   DULoxetine (CYMBALTA) 30 MG capsule TAKE 3 CAPSULES(90 MG) BY MOUTH DAILY 90 capsule 2 11/22/2023   gabapentin (NEURONTIN) 400 MG capsule TAKE 1 CAPSULE(400 MG) BY MOUTH THREE TIMES DAILY (Patient not taking: Reported on 11/24/2023) 180 capsule 2 Not Taking   hydrocortisone cream 1 % For flares: Apply to affected area 2 times daily until you see improvement in redness/rash, use for up to 14 days at a time then give your skin a break (Patient not taking: Reported on 11/24/2023) 30 g 1 Not Taking   oxyCODONE (OXY IR/ROXICODONE) 5 MG immediate release tablet Take 1 tablet (5 mg total) by mouth every 6 (six) hours as needed for severe pain. (Patient not taking: Reported on 11/24/2023) 30 tablet 0 Not Taking   Scheduled:   sevelamer carbonate  1,600 mg Oral TID WC   sodium zirconium cyclosilicate  10 g Oral TID   Infusions:   furosemide 120 mg (11/25/23 1724)   heparin 1,600 Units/hr (11/25/23 0949)   sodium bicarbonate 150 mEq in sterile water 1,150 mL infusion  200 mL/hr at 11/25/23 1504   PRN: albuterol, bisacodyl, HYDROmorphone (DILAUDID) injection, HYDROmorphone  Assessment: 42 yom with a history of bipolar disorder, HIV, previous thoracic outlet syndrome, anxiety, depression, IVDU (methamphetamines). Patient is presenting with several days of abdominal pain, bilateral leg pain right worse than left and bilateral leg swelling right worse than left. Heparin per pharmacy consult placed for DVT.  -VAS US  w/ bilateral DVT -heparin level= 0.22 on 1600 units/hr    Goal of Therapy:  Heparin level 0.3-0.7 units/ml Monitor platelets by anticoagulation protocol: Yes   Plan:  -Heparin 1000 unit bolus then increase to 1750 units/hr -Heparin level and CBC in am  Baxter Limber, PharmD Clinical Pharmacist **Pharmacist phone directory can now be found on [email or web address] ([email or web address]).  Listed under [facility].

## 2023-11-25 NOTE — Progress Notes (Addendum)
 Received message from nursing regarding patient concerns of chest pain.  By this time, EKG was obtained and nitroglycerin and albuterol were given per nursing protocol without improvement.  EKG similar to prior, T amplitude decreased from prior.  Patient examined at bedside, endorsing worsening dyspnea, chest tightness, and some pleuritic chest pain. He is now on 3 L nasal cannula saturating in the mid 90s.  Patient turned down to to room air, saturating 88 to 92% on room air.  On exam, worsening peripheral edema.  Previously 3+ to the knees bilaterally, new dependent edema appreciated in the thighs.  Bilateral crackles more pronounced than prior.  Chest x-ray obtained demonstrating vascular congestion with increased left basilar atelectasis.  Patient feels like he cannot get a deep breath.  He is persistently alert and oriented x 4, though he has more pronounced cognitive slowing compared to prior.  He does have some increased somnolence and is sleepy at times during examination.  Mild asterixis noted.  Differential would include PE given known DVTs, uremic peri-carditis, atelectasis, and volume overload.  Given exam findings, most likely related to volume overload.  Urine output continues to be suboptimal, condom cath in place with minimal urine.  Bladder scan was obtained which showed about 130 mL of urine in the bladder.    Plan: Decrease rate of bicarbonate drip to 75 mL/h given worsening volume status, respiratory status.  Will push additional 80 mg IV Lasix in hopes of diuresis. Will obtain repeat stat BMP to reassess BUN which has continued to climb during this admission.    Addendum: BMP resulted demonstrating stable hyponatremia, normal bicarb, improved BUN and creatinine.  Calcium still critically low at 5.5, will give additional 2 g of calcium.  Potassium downtrending at 3.9.  Bicarb within normal limits.  Will continue to trend BMP throughout the night, hope that downtrending will continue.   Will change Lokelma to daily, hold parameters set to potassium less than 4.0.

## 2023-11-25 NOTE — Progress Notes (Signed)
 Patient ID: Rose Quintanar, male   DOB: 1980/08/22, 43 y.o.   MRN: [id number] S: complaining of bilateral leg pain O:BP 115/77   Pulse 88   Temp 97.8 F (36.6 C) (Oral)   Resp 20   Ht 5\' 3"  (1.6 m)   Wt 77.1 kg   SpO2 100%   BMI 30.11 kg/m   Intake/Output Summary (Last 24 hours) at 11/25/2023 1401 Last data filed at 11/25/2023 1200 Gross per 24 hour  Intake 198.57 ml  Output 400 ml  Net -201.43 ml   Intake/Output: I/O last 3 completed shifts: In: 198.6 [IV Piggyback:198.6] Out: 200 [Urine:200]  Intake/Output this shift:  Total I/O In: -  Out: 200 [Urine:200] Weight change:  Gen: NAD CVS: RRR Resp:CTA Abd: +BS, soft, NT/ND Ext: 2+ tense edema bilateral lower extremities.   Recent Labs  Lab 11/24/23 1300 11/24/23 1333 11/24/23 1740 11/24/23 1952 11/25/23 0325 11/25/23 0736  NA 123* 119* 125* 125* 127* 125*  K 6.5* 6.1* 5.6* 5.4* 4.8 4.6  CL 87* 93* 91* 90* 86* 87*  CO2 12*  --  11* 12* 16* 15*  GLUCOSE 94 89 91 94 96 132*  BUN 127* >130* 128* 127* 132* 139*  CREATININE 10.81* 11.70* 10.95* 11.11* 11.45* 11.58*  ALBUMIN 3.1*  --  2.5*  --   --   --   CALCIUM 6.4*  --  6.2* 5.9* 6.2* 5.9*  PHOS  --   --  9.2*  --  10.2*  --   AST 1,578*  --   --   --   --   --   ALT 688*  --   --   --   --   --    Liver Function Tests: Recent Labs  Lab 11/24/23 1300 11/24/23 1740  AST 1,578*  --   ALT 688*  --   ALKPHOS 63  --   BILITOT 1.0  --   PROT 6.9  --   ALBUMIN 3.1* 2.5*   Recent Labs  Lab 11/24/23 1516  LIPASE 141*   No results for input(s): "AMMONIA" in the last 168 hours. CBC: Recent Labs  Lab 11/24/23 1300 11/24/23 1333 11/25/23 0325  WBC 18.9*  --  13.4*  NEUTROABS 15.7*  --   --   HGB 15.2 15.0 12.9*  HCT 42.2 44.0 35.3*  MCV 84.7  --  84.0  PLT 271  --  216   Cardiac Enzymes: Recent Labs  Lab 11/24/23 1300 11/25/23 0736  CKTOTAL >50,000* >50,000*   CBG: No results for input(s): "GLUCAP" in the last 168 hours.  Iron Studies:  No results for input(s): "IRON", "TIBC", "TRANSFERRIN", "FERRITIN" in the last 72 hours. Studies/Results: VAS [place] LOWER EXTREMITY VENOUS (DVT) (ONLY [facility] & [facility]) Result Date: 11/25/2023  Lower Venous DVT Study Patient Name:  OUSMAN DISE  Date of Exam:   11/24/2023 Medical Rec #: [id number]            Accession #:    [id number] Date of Birth: 11-Oct-1980            Patient Gender: M Patient Age:   33 years Exam Location:  [facility] Procedure:      VAS [place] LOWER EXTREMITY VENOUS (DVT) Referring Phys: Lynden Oxford --------------------------------------------------------------------------------  Indications: Swelling and calf cramping, left > right, X 3 days.  Limitations: Tight swelling of calves and popliteal fossa. Comparison Study: No prior LEV on file Performing Technologist: Sherren Kerns RVS  Examination Guidelines: A complete evaluation includes B-mode  imaging, spectral Doppler, color Doppler, and power Doppler as needed of all accessible portions of each vessel. Bilateral testing is considered an integral part of a complete examination. Limited examinations for reoccurring indications may be performed as noted. The reflux portion of the exam is performed with the patient in reverse Trendelenburg.  +---------+---------------+---------+-----------+----------+-------------------+ RIGHT    CompressibilityPhasicitySpontaneityPropertiesThrombus Aging      +---------+---------------+---------+-----------+----------+-------------------+ CFV      Full           Yes      No                                       +---------+---------------+---------+-----------+----------+-------------------+ SFJ      Full                                                             +---------+---------------+---------+-----------+----------+-------------------+ FV Prox  Full           Yes      No                                        +---------+---------------+---------+-----------+----------+-------------------+ FV Mid   Full                                                             +---------+---------------+---------+-----------+----------+-------------------+ FV DistalFull                                                             +---------+---------------+---------+-----------+----------+-------------------+ PFV      Full           Yes      No                                       +---------+---------------+---------+-----------+----------+-------------------+ POP                                                   Not well visualized +---------+---------------+---------+-----------+----------+-------------------+ PTV      None                                         Acute               +---------+---------------+---------+-----------+----------+-------------------+ PERO     None  Acute               +---------+---------------+---------+-----------+----------+-------------------+   +---------+---------------+---------+-----------+----------+-------------------+ LEFT     CompressibilityPhasicitySpontaneityPropertiesThrombus Aging      +---------+---------------+---------+-----------+----------+-------------------+ CFV      Full           Yes      No                                       +---------+---------------+---------+-----------+----------+-------------------+ SFJ      Full                                                             +---------+---------------+---------+-----------+----------+-------------------+ FV Prox  Full                                                             +---------+---------------+---------+-----------+----------+-------------------+ FV Mid   Full           Yes      No                                       +---------+---------------+---------+-----------+----------+-------------------+ FV  DistalFull                                                             +---------+---------------+---------+-----------+----------+-------------------+ PFV      Full                                                             +---------+---------------+---------+-----------+----------+-------------------+ POP                                                   Not well visualized +---------+---------------+---------+-----------+----------+-------------------+ PTV      None                                         Acute               +---------+---------------+---------+-----------+----------+-------------------+ PERO     None                                         Acute               +---------+---------------+---------+-----------+----------+-------------------+  Summary: RIGHT: - Findings consistent with acute deep vein thrombosis involving the right posterior tibial veins, and right peroneal veins. Technically limited and difficult study secondary to tightness of calf and popliteal fossa.   LEFT: - Findings consistent with acute deep vein thrombosis involving the proximal to mid left posterior tibial veins, and left peroneal veins. Technically limited and difficult study secondary to tightness of calf and popliteal fossa.   *See table(s) above for measurements and observations. Electronically signed by Jimmye Moulds MD on 11/25/2023 at 12:59:29 PM.    Final    ECHOCARDIOGRAM COMPLETE Result Date: 11/25/2023    ECHOCARDIOGRAM REPORT   Patient Name:   Rilyn Bushnell Date of Exam: 11/25/2023 Medical Rec #:  [id number]           Height:       63.0 in Accession #:    [id number]          Weight:       170.0 lb Date of Birth:  29-Dec-1980           BSA:          1.805 m Patient Age:    42 years            BP:           115/77 mmHg Patient Gender: M                   HR:           85 bpm. Exam Location:  Inpatient Procedure: 2D Echo, Color Doppler and Cardiac Doppler (Both  Spectral and Color            Flow Doppler were utilized during procedure). Indications:    R94.31 Abnormal EKG  History:        Patient has no prior history of Echocardiogram examinations.                 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  Sonographer:    Sherline Distel Senior RDCS Referring Phys: Jackolyn Masker IMPRESSIONS  1. Left ventricular ejection fraction, by estimation, is 65 to 70%. Left ventricular ejection fraction by PLAX is 69 %. The left ventricle has normal function. The left ventricle has no regional wall motion abnormalities. Left ventricular diastolic parameters were normal.  2. Right ventricular systolic function is normal. The right ventricular size is normal. There is normal pulmonary artery systolic pressure. The estimated right ventricular systolic pressure is 34.8 mmHg.  3. The mitral valve is normal in structure. Mild mitral valve regurgitation.  4. The aortic valve has an indeterminant number of cusps. Aortic valve regurgitation is not visualized.  5. The inferior vena cava is normal in size with <50% respiratory variability, suggesting right atrial pressure of 8 mmHg. FINDINGS  Left Ventricle: Left ventricular ejection fraction, by estimation, is 65 to 70%. Left ventricular ejection fraction by PLAX is 69 %. The left ventricle has normal function. The left ventricle has no regional wall motion abnormalities. The left ventricular internal cavity size was normal in size. There is no left ventricular hypertrophy. Left ventricular diastolic parameters were normal. Right Ventricle: The right ventricular size is normal. No increase in right ventricular wall thickness. Right ventricular systolic function is normal. There is normal pulmonary artery systolic pressure. The tricuspid regurgitant velocity is 2.59 m/s, and  with an assumed right atrial pressure of 8 mmHg, the estimated right ventricular systolic pressure is 34.8 mmHg. Left Atrium: Left atrial size was normal in size. Right Atrium: Right atrial  size was  normal in size. Pericardium: There is no evidence of pericardial effusion. Mitral Valve: The mitral valve is normal in structure. Mild mitral valve regurgitation. Tricuspid Valve: The tricuspid valve is normal in structure. Tricuspid valve regurgitation is trivial. Aortic Valve: The aortic valve has an indeterminant number of cusps. Aortic valve regurgitation is not visualized. Pulmonic Valve: The pulmonic valve was normal in structure. Pulmonic valve regurgitation is trivial. Aorta: The aortic root and ascending aorta are structurally normal, with no evidence of dilitation. Venous: The inferior vena cava is normal in size with less than 50% respiratory variability, suggesting right atrial pressure of 8 mmHg. IAS/Shunts: No atrial level shunt detected by color flow Doppler.  LEFT VENTRICLE PLAX 2D LV EF:         Left            Diastology                ventricular     LV e' medial:    11.60 cm/s                ejection        LV E/e' medial:  7.5                fraction by     LV e' lateral:   15.30 cm/s                PLAX is 69      LV E/e' lateral: 5.7                %. LVIDd:         4.40 cm LVIDs:         2.70 cm LV PW:         1.00 cm LV IVS:        1.00 cm LVOT diam:     1.90 cm LV SV:         63 LV SV Index:   35 LVOT Area:     2.84 cm  RIGHT VENTRICLE RV S prime:     14.10 cm/s TAPSE (M-mode): 1.9 cm LEFT ATRIUM             Index        RIGHT ATRIUM           Index LA diam:        3.30 cm 1.83 cm/m   RA Area:     12.20 cm LA Vol (A2C):   48.8 ml 27.04 ml/m  RA Volume:   24.40 ml  13.52 ml/m LA Vol (A4C):   42.9 ml 23.77 ml/m LA Biplane Vol: 47.9 ml 26.54 ml/m  AORTIC VALVE LVOT Vmax:   115.00 cm/s LVOT Vmean:  86.500 cm/s LVOT VTI:    0.221 m  AORTA Ao Root diam: 3.50 cm Ao Asc diam:  3.10 cm MITRAL VALVE               TRICUSPID VALVE MV Area (PHT): 4.36 cm    TR Peak grad:   26.8 mmHg MV Decel Time: 174 msec    TR Vmax:        259.00 cm/s MV E velocity: 86.50 cm/s MV A velocity: 58.70 cm/s  SHUNTS  MV E/A ratio:  1.47        Systemic VTI:  0.22 m  Systemic Diam: 1.90 cm Arvilla Meres MD Electronically signed by Arvilla Meres MD Signature Date/Time: 11/25/2023/11:17:58 AM    Final    US RENAL Result Date: 11/25/2023 CLINICAL DATA:  Acute renal failure EXAM: RENAL / URINARY TRACT ULTRASOUND COMPLETE COMPARISON:  Abdominal CT from 1 day prior FINDINGS: Right Kidney: Renal measurements: 13 x 6 x 6 cm = volume: 250 mL. Increased cortical echogenicity. No hydronephrosis or mass. Left Kidney: Renal measurements: 13 x 6.5 x 6 cm with symmetric volume. Increased cortical echogenicity with prominent corticomedullary differentiation. No hydronephrosis or mass. Bladder: Collapsed IMPRESSION: Medical renal disease with normal if not enlarged renal size. Electronically Signed   By: Tiburcio Pea M.D.   On: 11/25/2023 04:38   US Abdomen Limited RUQ (LIVER/GB) Result Date: 11/24/2023 CLINICAL DATA:  Elevated LFTs. EXAM: ULTRASOUND ABDOMEN LIMITED RIGHT UPPER QUADRANT COMPARISON:  CT abdomen and pelvis 11/24/2023 FINDINGS: Gallbladder: No gallstones or wall thickening visualized. No sonographic Murphy sign noted by sonographer. Common bile duct: Diameter: 4.3 mm Liver: No focal lesion identified. Within normal limits in parenchymal echogenicity. Portal vein is patent on color Doppler imaging with normal direction of blood flow towards the liver. Other: None. IMPRESSION: Normal right upper quadrant ultrasound. Electronically Signed   By: Darliss Cheney M.D.   On: 11/24/2023 21:14   CT EXTREMITY LOWER RIGHT WO CONTRAST Result Date: 11/24/2023 CLINICAL DATA:  Right leg swelling.  Concern for abscess. EXAM: CT OF THE LOWER RIGHT EXTREMITY WITHOUT CONTRAST TECHNIQUE: Multidetector CT imaging of the right lower extremity was performed according to the standard protocol. RADIATION DOSE REDUCTION: This exam was performed according to the departmental dose-optimization program which includes  automated exposure control, adjustment of the mA and/or kV according to patient size and/or use of iterative reconstruction technique. COMPARISON:  None Available. FINDINGS: Bones/Joint/Cartilage Choose 1.  No bone destruction. Ligaments Suboptimally assessed by CT. Muscles and Tendons Negative Soft tissues Edema throughout the subcutaneous soft tissues of the right leg. No focal fluid collection to suggest abscess. Small nodules in the subcutaneous soft tissues of the right buttock, likely injection granulomata. IMPRESSION: Edema throughout the subcutaneous soft tissues of the right lower extremity. No focal fluid collection to suggest abscess. No acute bony abnormality. Electronically Signed   By: Charlett Nose M.D.   On: 11/24/2023 18:07   CT ABDOMEN PELVIS WO CONTRAST Result Date: 11/24/2023 CLINICAL DATA:  Abdominal pain. Leg pain. Acute renal injury. No IV contrast administered. EXAM: CT ABDOMEN AND PELVIS WITHOUT CONTRAST TECHNIQUE: Multidetector CT imaging of the abdomen and pelvis was performed following the standard protocol without IV contrast. RADIATION DOSE REDUCTION: This exam was performed according to the departmental dose-optimization program which includes automated exposure control, adjustment of the mA and/or kV according to patient size and/or use of iterative reconstruction technique. COMPARISON:  None Available. FINDINGS: Lower chest: Lung bases are clear. Hepatobiliary: No focal hepatic lesion. Normal gallbladder. No biliary duct dilatation. Common bile duct is normal. Pancreas: Pancreas is normal. No ductal dilatation. No pancreatic inflammation. Spleen: Normal spleen Adrenals/urinary tract: Adrenal glands normal. There is mild renal edema on the RIGHT. LEFT kidney appears normal. No hydronephrosis. There is no obstructing calculi. Stomach/Bowel: Stomach, small bowel, appendix, and cecum are normal. The colon and rectosigmoid colon are normal. Vascular/Lymphatic: Abdominal aorta is normal  caliber. No periportal or retroperitoneal adenopathy. No pelvic adenopathy. Reproductive: Unremarkable Other: None Musculoskeletal: No aggressive osseous lesion. Injection granuloma in the flanks over the buttocks. IMPRESSION: 1. Mild RIGHT renal edema. No hydronephrosis. No obstructing  calculi. Indeterminate finding. Recommend clinical correlation for renal infection. 2. Normal LEFT kidney. 3. Normal appendix. Electronically Signed   By: Deboraha Fallow M.D.   On: 11/24/2023 17:40   DG Chest Portable 1 View Result Date: 11/24/2023 CLINICAL DATA:  I edema, tachycardia EXAM: PORTABLE CHEST 1 VIEW COMPARISON:  None Available. FINDINGS: Normal cardiac silhouette. Subtle airspace density in the RIGHT lower lobe. Relatively low lung volumes. No pneumothorax. No acute osseous abnormality. IMPRESSION: Low lung volumes. Atelectasis versus infiltrate at the RIGHT lung base. Electronically Signed   By: Deboraha Fallow M.D.   On: 11/24/2023 13:47    furosemide  80 mg Intravenous Q12H   sevelamer carbonate  1,600 mg Oral TID WC   sodium zirconium cyclosilicate  10 g Oral TID    BMET    Component Value Date/Time   NA 125 (L) 11/25/2023 0736   K 4.6 11/25/2023 0736   CL 87 (L) 11/25/2023 0736   CO2 15 (L) 11/25/2023 0736   GLUCOSE 132 (H) 11/25/2023 0736   BUN 139 (H) 11/25/2023 0736   CREATININE 11.58 (H) 11/25/2023 0736   CREATININE 1.29 09/26/2023 1455   CALCIUM 5.9 (LL) 11/25/2023 0736   GFRNONAA 5 (L) 11/25/2023 0736   GFRNONAA 92 07/23/2019 1426   GFRAA 58 (L) 03/05/2020 0345   GFRAA 106 07/23/2019 1426   CBC    Component Value Date/Time   WBC 13.4 (H) 11/25/2023 0325   RBC 4.20 (L) 11/25/2023 0325   HGB 12.9 (L) 11/25/2023 0325   HGB 16.1 08/20/2022 1424   HCT 35.3 (L) 11/25/2023 0325   HCT 47.8 08/20/2022 1424   PLT 216 11/25/2023 0325   PLT 313 08/20/2022 1424   MCV 84.0 11/25/2023 0325   MCV 93 08/20/2022 1424   MCH 30.7 11/25/2023 0325   MCHC 36.5 (H) 11/25/2023 0325   RDW  12.7 11/25/2023 0325   RDW 11.9 08/20/2022 1424   LYMPHSABS 1.7 11/24/2023 1300   LYMPHSABS 1.9 08/20/2022 1424   MONOABS 1.4 (H) 11/24/2023 1300   EOSABS 0.0 11/24/2023 1300   EOSABS 0.0 08/20/2022 1424   BASOSABS 0.0 11/24/2023 1300   BASOSABS 0.0 08/20/2022 1424     Assessment/Plan:  AKI- in the setting of severe rhabdomyolysis.  He was started on IVF's with some UOP but remains oliguric despite IV lasix.  We discussed that he may need to start dialysis during this hospitalization if his UOP and renal function does not improve over the next 24-48 hours.  Rhabdomyolysis - presumably due to acute meth intoxication.  C/o leg pain.  Continue to follow CK levels Acute bilateral DVT's - per primary svc Abnormal LFT's - due to meth and rhabdo.  Continue to follow. 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  AGMA - due to #1, on isotonic bicarb.   Benjamin Brands, MD [employer] [phone]

## 2023-11-25 NOTE — Progress Notes (Signed)
 PT Cancellation Note  Patient Details Name: Kenneth Hunt MRN: [id number] DOB: 06/04/1981   Cancelled Treatment:    Reason Eval/Treat Not Completed: (P) Medical issues which prohibited therapy Pt has new DVT in L LE. He was started on heparin but has not been on it for 24 hours, and is subtherapeutic at this time. Next heparin level is scheduled in 8 hours. PT will follow back for Evaluation tomorrow.  Aileena Iglesia B. Jewel Mortimer PT, DPT Acute Rehabilitation Services Please use secure chat or  Call Office [phone]    Verlie Glisson [facility] 11/25/2023, 8:48 AM

## 2023-11-26 ENCOUNTER — Inpatient Hospital Stay (HOSPITAL_COMMUNITY): Payer: Medicare (Managed Care)

## 2023-11-26 DIAGNOSIS — E8721 Acute metabolic acidosis: Secondary | ICD-10-CM | POA: Diagnosis not present

## 2023-11-26 DIAGNOSIS — E875 Hyperkalemia: Secondary | ICD-10-CM | POA: Diagnosis not present

## 2023-11-26 DIAGNOSIS — N179 Acute kidney failure, unspecified: Secondary | ICD-10-CM | POA: Diagnosis not present

## 2023-11-26 DIAGNOSIS — M6282 Rhabdomyolysis: Secondary | ICD-10-CM | POA: Diagnosis not present

## 2023-11-26 HISTORY — PX: IR FLUORO GUIDE CV LINE RIGHT: IMG2283

## 2023-11-26 HISTORY — PX: IR US GUIDE VASC ACCESS RIGHT: IMG2390

## 2023-11-26 LAB — RENAL FUNCTION PANEL
Albumin: 2.2 g/dL — ABNORMAL LOW (ref 3.5–5.0)
Anion gap: 28 — ABNORMAL HIGH (ref 5–15)
BUN: 150 mg/dL — ABNORMAL HIGH (ref 6–20)
CO2: 19 mmol/L — ABNORMAL LOW (ref 22–32)
Calcium: 6.3 mg/dL — CL (ref 8.9–10.3)
Chloride: 79 mmol/L — ABNORMAL LOW (ref 98–111)
Creatinine, Ser: 12.49 mg/dL — ABNORMAL HIGH (ref 0.61–1.24)
GFR, Estimated: 5 mL/min — ABNORMAL LOW (ref >60)
Glucose, Bld: 106 mg/dL — ABNORMAL HIGH (ref 70–99)
Phosphorus: 11.4 mg/dL — ABNORMAL HIGH (ref 2.5–4.6)
Potassium: 4.5 mmol/L (ref 3.5–5.1)
Sodium: 126 mmol/L — ABNORMAL LOW (ref 135–145)

## 2023-11-26 LAB — MAGNESIUM: Magnesium: 2.1 mg/dL (ref 1.7–2.4)

## 2023-11-26 LAB — CBC
HCT: 30.7 % — ABNORMAL LOW (ref 39.0–52.0)
Hemoglobin: 11.5 g/dL — ABNORMAL LOW (ref 13.0–17.0)
MCH: 30.6 pg (ref 26.0–34.0)
MCHC: 37.5 g/dL — ABNORMAL HIGH (ref 30.0–36.0)
MCV: 81.6 fL (ref 80.0–100.0)
Platelets: 209 10*3/uL (ref 150–400)
RBC: 3.76 MIL/uL — ABNORMAL LOW (ref 4.22–5.81)
RDW: 12.5 % (ref 11.5–15.5)
WBC: 13.6 10*3/uL — ABNORMAL HIGH (ref 4.0–10.5)
nRBC: 0 % (ref 0.0–0.2)

## 2023-11-26 LAB — HEPATIC FUNCTION PANEL
ALT: 301 U/L — ABNORMAL HIGH (ref 0–44)
AST: 501 U/L — ABNORMAL HIGH (ref 15–41)
Albumin: 2.2 g/dL — ABNORMAL LOW (ref 3.5–5.0)
Alkaline Phosphatase: 45 U/L (ref 38–126)
Bilirubin, Direct: 0.2 mg/dL (ref 0.0–0.2)
Indirect Bilirubin: 1 mg/dL — ABNORMAL HIGH (ref 0.3–0.9)
Total Bilirubin: 1.2 mg/dL (ref 0.0–1.2)
Total Protein: 5.1 g/dL — ABNORMAL LOW (ref 6.5–8.1)

## 2023-11-26 LAB — ANTITHROMBIN III: AntiThromb III Func: 63 % — ABNORMAL LOW (ref 75–120)

## 2023-11-26 LAB — GAMMA GT: GGT: 18 U/L (ref 7–50)

## 2023-11-26 LAB — BASIC METABOLIC PANEL WITH GFR
Anion gap: 25 — ABNORMAL HIGH (ref 5–15)
BUN: 138 mg/dL — ABNORMAL HIGH (ref 6–20)
CO2: 25 mmol/L (ref 22–32)
Calcium: 5.5 mg/dL — CL (ref 8.9–10.3)
Chloride: 75 mmol/L — ABNORMAL LOW (ref 98–111)
Creatinine, Ser: 11.37 mg/dL — ABNORMAL HIGH (ref 0.61–1.24)
GFR, Estimated: 5 mL/min — ABNORMAL LOW (ref >60)
Glucose, Bld: 108 mg/dL — ABNORMAL HIGH (ref 70–99)
Potassium: 3.9 mmol/L (ref 3.5–5.1)
Sodium: 125 mmol/L — ABNORMAL LOW (ref 135–145)

## 2023-11-26 LAB — HEPARIN LEVEL (UNFRACTIONATED)
Heparin Unfractionated: 0.14 [IU]/mL — ABNORMAL LOW (ref 0.30–0.70)
Heparin Unfractionated: 0.44 [IU]/mL (ref 0.30–0.70)

## 2023-11-26 LAB — CK: Total CK: 37060 U/L — ABNORMAL HIGH (ref 49–397)

## 2023-11-26 MED ORDER — PENTAFLUOROPROP-TETRAFLUOROETH EX AERO: 1.0000 | INHALATION_SPRAY | CUTANEOUS | Status: DC | PRN

## 2023-11-26 MED ORDER — ONDANSETRON 4 MG PO TBDP: 4.0000 mg | ORAL_TABLET | Freq: Once | ORAL | Status: DC

## 2023-11-26 MED ORDER — CHLORHEXIDINE GLUCONATE CLOTH 2 % EX PADS
6.0000 | MEDICATED_PAD | Freq: Every day | CUTANEOUS | Status: DC
  Administered 2023-11-27 – 2023-12-06 (×10): 6 via TOPICAL

## 2023-11-26 MED ORDER — LIDOCAINE HCL 1 % IJ SOLN
20.0000 mL | Freq: Once | INTRAMUSCULAR | Status: AC
Start: 2023-11-26 — End: 2023-11-26
  Administered 2023-11-26: 6 mL

## 2023-11-26 MED ORDER — SODIUM ZIRCONIUM CYCLOSILICATE 10 G PO PACK
10.0000 g | PACK | Freq: Every day | ORAL | Status: DC
  Administered 2023-11-26 – 2023-11-29 (×4): 10 g via ORAL
  Filled 2023-11-26 (×4): qty 1

## 2023-11-26 MED ORDER — LIDOCAINE HCL (PF) 1 % IJ SOLN: 5.0000 mL | INTRAMUSCULAR | Status: DC | PRN

## 2023-11-26 MED ORDER — LIDOCAINE-PRILOCAINE 2.5-2.5 % EX CREA: 1.0000 | TOPICAL_CREAM | CUTANEOUS | Status: DC | PRN

## 2023-11-26 MED ORDER — CALCIUM GLUCONATE-NACL 2-0.675 GM/100ML-% IV SOLN
2.0000 g | Freq: Once | INTRAVENOUS | Status: AC
  Administered 2023-11-26: 2000 mg via INTRAVENOUS
  Filled 2023-11-26: qty 100

## 2023-11-26 MED ORDER — HEPARIN SODIUM (PORCINE) 1000 UNIT/ML IJ SOLN
2800.0000 [IU] | Freq: Once | INTRAMUSCULAR | Status: AC
Start: 2023-11-26 — End: 2023-11-26
  Administered 2023-11-26: 2800 [IU] via INTRAVENOUS
  Filled 2023-11-26: qty 2.8

## 2023-11-26 MED ORDER — HEPARIN BOLUS VIA INFUSION
2000.0000 [IU] | Freq: Once | INTRAVENOUS | Status: AC
  Administered 2023-11-26: 2000 [IU] via INTRAVENOUS

## 2023-11-26 MED ORDER — NEPRO/CARBSTEADY PO LIQD: 237.0000 mL | ORAL | Status: DC | PRN

## 2023-11-26 MED ORDER — ANTICOAGULANT SODIUM CITRATE 4% (200MG/5ML) IV SOLN: 5.0000 mL | Status: DC | PRN

## 2023-11-26 MED ORDER — LIDOCAINE-EPINEPHRINE 1 %-1:100000 IJ SOLN
INTRAMUSCULAR | Status: AC
  Filled 2023-11-26: qty 1

## 2023-11-26 MED ORDER — ALTEPLASE 2 MG IJ SOLR: 2.0000 mg | Freq: Once | INTRAMUSCULAR | Status: DC | PRN

## 2023-11-26 MED ORDER — ONDANSETRON HCL 4 MG/2ML IJ SOLN
4.0000 mg | Freq: Once | INTRAMUSCULAR | Status: AC
  Administered 2023-11-26: 4 mg via INTRAVENOUS
  Filled 2023-11-26: qty 2

## 2023-11-26 MED ORDER — HEPARIN SODIUM (PORCINE) 1000 UNIT/ML IJ SOLN
INTRAMUSCULAR | Status: AC
  Filled 2023-11-26: qty 10

## 2023-11-26 MED ORDER — HEPARIN SODIUM (PORCINE) 1000 UNIT/ML DIALYSIS
1000.0000 [IU] | INTRAMUSCULAR | Status: DC | PRN
  Administered 2023-11-26: 2800 [IU]
  Filled 2023-11-26: qty 1

## 2023-11-26 NOTE — Progress Notes (Signed)
  Progress Note   Date: 11/26/2023  Patient Name: Kenneth Hunt        MRN#: [id number]   Clarification of diagnosis:   Acute hypoxic respiratory failure SpO2 < 91% on RA with significant clinical signs of respiratory distress;

## 2023-11-26 NOTE — Progress Notes (Signed)
 Patient ID: Kenneth Hunt, male   DOB: 21-Oct-1980, 43 y.o.   MRN: [id number] S: Was complaining of chest pain last night.  Volume worsening and isotonic bicarb drip lowered.  Still c/o pain in legs. O:BP 119/69 (BP Location: Left Arm)   Pulse 95   Temp 97.6 F (36.4 C) (Oral)   Resp 20   Ht 5\' 3"  (1.6 m)   Wt 77.1 kg   SpO2 97%   BMI 30.11 kg/m   Intake/Output Summary (Last 24 hours) at 11/26/2023 1112 Last data filed at 11/26/2023 0900 Gross per 24 hour  Intake 760 ml  Output 650 ml  Net 110 ml   Intake/Output: I/O last 3 completed shifts: In: 600 [P.O.:600] Out: 850 [Urine:850]  Intake/Output this shift:  Total I/O In: 160 [P.O.:160] Out: -  Weight change:  Gen: ill-appearing CVS: RRR Resp: bibasilar crackles Abd: +BS, soft, NT/ND Ext: 3+ edema, tense and tender  Recent Labs  Lab 11/24/23 1300 11/24/23 1333 11/24/23 1740 11/24/23 1952 11/25/23 0325 11/25/23 0736 11/25/23 1318 11/25/23 1723 11/25/23 1952 11/25/23 2357 11/26/23 0524  NA 123*   < > 125*   < > 127* 125* 126* 124* 125* 125* 126*  K 6.5*   < > 5.6*   < > 4.8 4.6 4.6 4.4 4.1 3.9 4.5  CL 87*   < > 91*   < > 86* 87* 83* 81* 81* 75* 79*  CO2 12*  --  11*   < > 16* 15* 19* 19* 21* 25 19*  GLUCOSE 94   < > 91   < > 96 132* 110* 108* 110* 108* 106*  BUN 127*   < > 128*   < > 132* 139* 141* 143* 144* 138* 150*  CREATININE 10.81*   < > 10.95*   < > 11.45* 11.58* 11.69* 12.10* 12.10* 11.37* 12.49*  ALBUMIN 3.1*  --  2.5*  --   --   --   --   --   --   --  2.2*  2.2*  CALCIUM 6.4*  --  6.2*   < > 6.2* 5.9* 6.0* 6.0* 5.8* 5.5* 6.3*  PHOS  --   --  9.2*  --  10.2*  --   --   --   --   --  11.4*  AST 1,578*  --   --   --   --   --   --   --   --   --  501*  ALT 688*  --   --   --   --   --   --   --   --   --  301*   < > = values in this interval not displayed.   Liver Function Tests: Recent Labs  Lab 11/24/23 1300 11/24/23 1740 11/26/23 0524  AST 1,578*  --  501*  ALT 688*  --  301*  ALKPHOS 63   --  45  BILITOT 1.0  --  1.2  PROT 6.9  --  5.1*  ALBUMIN 3.1* 2.5* 2.2*  2.2*   Recent Labs  Lab 11/24/23 1516  LIPASE 141*   No results for input(s): "AMMONIA" in the last 168 hours. CBC: Recent Labs  Lab 11/24/23 1300 11/24/23 1333 11/25/23 0325 11/26/23 0524  WBC 18.9*  --  13.4* 13.6*  NEUTROABS 15.7*  --   --   --   HGB 15.2 15.0 12.9* 11.5*  HCT 42.2 44.0 35.3* 30.7*  MCV 84.7  --  84.0 81.6  PLT 271  --  216 209   Cardiac Enzymes: Recent Labs  Lab 11/24/23 1300 11/25/23 0736 11/26/23 0524  CKTOTAL >50,000* >50,000* 37,060*   CBG: No results for input(s): "GLUCAP" in the last 168 hours.  Iron Studies: No results for input(s): "IRON", "TIBC", "TRANSFERRIN", "FERRITIN" in the last 72 hours. Studies/Results: DG CHEST PORT 1 VIEW Result Date: 11/25/2023 CLINICAL DATA:  Pleuritic chest pain and increasing oxygen requirement EXAM: PORTABLE CHEST 1 VIEW COMPARISON:  11/24/2023 FINDINGS: Cardiac shadow is within normal limits. The overall inspiratory effort is poor with crowding of the vascular markings. Mild central vascular congestion is seen as well as increasing left basilar atelectasis. No bony abnormality is noted. IMPRESSION: Mild vascular congestion with increasing left basilar atelectasis. Electronically Signed   By: Alcide Clever M.D.   On: 11/25/2023 23:38   VAS [place] LOWER EXTREMITY VENOUS (DVT) (ONLY [facility] & [facility]) Result Date: 11/25/2023  Lower Venous DVT Study Patient Name:  Kenneth Hunt  Date of Exam:   11/24/2023 Medical Rec #: [id number]            Accession #:    [id number] Date of Birth: Sep 10, 1980            Patient Gender: M Patient Age:   32 years Exam Location:  [facility] Procedure:      VAS [place] LOWER EXTREMITY VENOUS (DVT) Referring Phys: Lynden Oxford --------------------------------------------------------------------------------  Indications: Swelling and calf cramping, left > right, X 3 days.  Limitations: Tight swelling of calves and  popliteal fossa. Comparison Study: No prior LEV on file Performing Technologist: Sherren Kerns RVS  Examination Guidelines: A complete evaluation includes B-mode imaging, spectral Doppler, color Doppler, and power Doppler as needed of all accessible portions of each vessel. Bilateral testing is considered an integral part of a complete examination. Limited examinations for reoccurring indications may be performed as noted. The reflux portion of the exam is performed with the patient in reverse Trendelenburg.  +---------+---------------+---------+-----------+----------+-------------------+ RIGHT    CompressibilityPhasicitySpontaneityPropertiesThrombus Aging      +---------+---------------+---------+-----------+----------+-------------------+ CFV      Full           Yes      No                                       +---------+---------------+---------+-----------+----------+-------------------+ SFJ      Full                                                             +---------+---------------+---------+-----------+----------+-------------------+ FV Prox  Full           Yes      No                                       +---------+---------------+---------+-----------+----------+-------------------+ FV Mid   Full                                                             +---------+---------------+---------+-----------+----------+-------------------+  FV DistalFull                                                             +---------+---------------+---------+-----------+----------+-------------------+ PFV      Full           Yes      No                                       +---------+---------------+---------+-----------+----------+-------------------+ POP                                                   Not well visualized +---------+---------------+---------+-----------+----------+-------------------+ PTV      None                                          Acute               +---------+---------------+---------+-----------+----------+-------------------+ PERO     None                                         Acute               +---------+---------------+---------+-----------+----------+-------------------+   +---------+---------------+---------+-----------+----------+-------------------+ LEFT     CompressibilityPhasicitySpontaneityPropertiesThrombus Aging      +---------+---------------+---------+-----------+----------+-------------------+ CFV      Full           Yes      No                                       +---------+---------------+---------+-----------+----------+-------------------+ SFJ      Full                                                             +---------+---------------+---------+-----------+----------+-------------------+ FV Prox  Full                                                             +---------+---------------+---------+-----------+----------+-------------------+ FV Mid   Full           Yes      No                                       +---------+---------------+---------+-----------+----------+-------------------+ FV DistalFull                                                             +---------+---------------+---------+-----------+----------+-------------------+  PFV      Full                                                             +---------+---------------+---------+-----------+----------+-------------------+ POP                                                   Not well visualized +---------+---------------+---------+-----------+----------+-------------------+ PTV      None                                         Acute               +---------+---------------+---------+-----------+----------+-------------------+ PERO     None                                         Acute                +---------+---------------+---------+-----------+----------+-------------------+     Summary: RIGHT: - Findings consistent with acute deep vein thrombosis involving the right posterior tibial veins, and right peroneal veins. Technically limited and difficult study secondary to tightness of calf and popliteal fossa.   LEFT: - Findings consistent with acute deep vein thrombosis involving the proximal to mid left posterior tibial veins, and left peroneal veins. Technically limited and difficult study secondary to tightness of calf and popliteal fossa.   *See table(s) above for measurements and observations. Electronically signed by Jimmye Moulds MD on 11/25/2023 at 12:59:29 PM.    Final    ECHOCARDIOGRAM COMPLETE Result Date: 11/25/2023    ECHOCARDIOGRAM REPORT   Patient Name:   Kenneth Hunt Date of Exam: 11/25/2023 Medical Rec #:  [id number]           Height:       63.0 in Accession #:    [id number]          Weight:       170.0 lb Date of Birth:  [birth year]           BSA:          1.805 m Patient Age:    42 years            BP:           115/77 mmHg Patient Gender: M                   HR:           85 bpm. Exam Location:  Inpatient Procedure: 2D Echo, Color Doppler and Cardiac Doppler (Both Spectral and Color            Flow Doppler were utilized during procedure). Indications:    R94.31 Abnormal EKG  History:        Patient has no prior history of Echocardiogram examinations.                 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  Sonographer:    Sherline Distel Senior RDCS Referring Phys: Jackolyn Masker  IMPRESSIONS  1. Left ventricular ejection fraction, by estimation, is 65 to 70%. Left ventricular ejection fraction by PLAX is 69 %. The left ventricle has normal function. The left ventricle has no regional wall motion abnormalities. Left ventricular diastolic parameters were normal.  2. Right ventricular systolic function is normal. The right ventricular size is normal. There is normal pulmonary artery systolic pressure. The  estimated right ventricular systolic pressure is 34.8 mmHg.  3. The mitral valve is normal in structure. Mild mitral valve regurgitation.  4. The aortic valve has an indeterminant number of cusps. Aortic valve regurgitation is not visualized.  5. The inferior vena cava is normal in size with <50% respiratory variability, suggesting right atrial pressure of 8 mmHg. FINDINGS  Left Ventricle: Left ventricular ejection fraction, by estimation, is 65 to 70%. Left ventricular ejection fraction by PLAX is 69 %. The left ventricle has normal function. The left ventricle has no regional wall motion abnormalities. The left ventricular internal cavity size was normal in size. There is no left ventricular hypertrophy. Left ventricular diastolic parameters were normal. Right Ventricle: The right ventricular size is normal. No increase in right ventricular wall thickness. Right ventricular systolic function is normal. There is normal pulmonary artery systolic pressure. The tricuspid regurgitant velocity is 2.59 m/s, and  with an assumed right atrial pressure of 8 mmHg, the estimated right ventricular systolic pressure is 34.8 mmHg. Left Atrium: Left atrial size was normal in size. Right Atrium: Right atrial size was normal in size. Pericardium: There is no evidence of pericardial effusion. Mitral Valve: The mitral valve is normal in structure. Mild mitral valve regurgitation. Tricuspid Valve: The tricuspid valve is normal in structure. Tricuspid valve regurgitation is trivial. Aortic Valve: The aortic valve has an indeterminant number of cusps. Aortic valve regurgitation is not visualized. Pulmonic Valve: The pulmonic valve was normal in structure. Pulmonic valve regurgitation is trivial. Aorta: The aortic root and ascending aorta are structurally normal, with no evidence of dilitation. Venous: The inferior vena cava is normal in size with less than 50% respiratory variability, suggesting right atrial pressure of 8 mmHg.  IAS/Shunts: No atrial level shunt detected by color flow Doppler.  LEFT VENTRICLE PLAX 2D LV EF:         Left            Diastology                ventricular     LV e' medial:    11.60 cm/s                ejection        LV E/e' medial:  7.5                fraction by     LV e' lateral:   15.30 cm/s                PLAX is 69      LV E/e' lateral: 5.7                %. LVIDd:         4.40 cm LVIDs:         2.70 cm LV PW:         1.00 cm LV IVS:        1.00 cm LVOT diam:     1.90 cm LV SV:         63 LV SV Index:   35 LVOT Area:  2.84 cm  RIGHT VENTRICLE RV S prime:     14.10 cm/s TAPSE (M-mode): 1.9 cm LEFT ATRIUM             Index        RIGHT ATRIUM           Index LA diam:        3.30 cm 1.83 cm/m   RA Area:     12.20 cm LA Vol (A2C):   48.8 ml 27.04 ml/m  RA Volume:   24.40 ml  13.52 ml/m LA Vol (A4C):   42.9 ml 23.77 ml/m LA Biplane Vol: 47.9 ml 26.54 ml/m  AORTIC VALVE LVOT Vmax:   115.00 cm/s LVOT Vmean:  86.500 cm/s LVOT VTI:    0.221 m  AORTA Ao Root diam: 3.50 cm Ao Asc diam:  3.10 cm MITRAL VALVE               TRICUSPID VALVE MV Area (PHT): 4.36 cm    TR Peak grad:   26.8 mmHg MV Decel Time: 174 msec    TR Vmax:        259.00 cm/s MV E velocity: 86.50 cm/s MV A velocity: 58.70 cm/s  SHUNTS MV E/A ratio:  1.47        Systemic VTI:  0.22 m                            Systemic Diam: 1.90 cm Arvilla Meres MD Electronically signed by Arvilla Meres MD Signature Date/Time: 11/25/2023/11:17:58 AM    Final    US RENAL Result Date: 11/25/2023 CLINICAL DATA:  Acute renal failure EXAM: RENAL / URINARY TRACT ULTRASOUND COMPLETE COMPARISON:  Abdominal CT from 1 day prior FINDINGS: Right Kidney: Renal measurements: 13 x 6 x 6 cm = volume: 250 mL. Increased cortical echogenicity. No hydronephrosis or mass. Left Kidney: Renal measurements: 13 x 6.5 x 6 cm with symmetric volume. Increased cortical echogenicity with prominent corticomedullary differentiation. No hydronephrosis or mass. Bladder: Collapsed  IMPRESSION: Medical renal disease with normal if not enlarged renal size. Electronically Signed   By: Tiburcio Pea M.D.   On: 11/25/2023 04:38   US Abdomen Limited RUQ (LIVER/GB) Result Date: 11/24/2023 CLINICAL DATA:  Elevated LFTs. EXAM: ULTRASOUND ABDOMEN LIMITED RIGHT UPPER QUADRANT COMPARISON:  CT abdomen and pelvis 11/24/2023 FINDINGS: Gallbladder: No gallstones or wall thickening visualized. No sonographic Murphy sign noted by sonographer. Common bile duct: Diameter: 4.3 mm Liver: No focal lesion identified. Within normal limits in parenchymal echogenicity. Portal vein is patent on color Doppler imaging with normal direction of blood flow towards the liver. Other: None. IMPRESSION: Normal right upper quadrant ultrasound. Electronically Signed   By: Darliss Cheney M.D.   On: 11/24/2023 21:14   CT EXTREMITY LOWER RIGHT WO CONTRAST Result Date: 11/24/2023 CLINICAL DATA:  Right leg swelling.  Concern for abscess. EXAM: CT OF THE LOWER RIGHT EXTREMITY WITHOUT CONTRAST TECHNIQUE: Multidetector CT imaging of the right lower extremity was performed according to the standard protocol. RADIATION DOSE REDUCTION: This exam was performed according to the departmental dose-optimization program which includes automated exposure control, adjustment of the mA and/or kV according to patient size and/or use of iterative reconstruction technique. COMPARISON:  None Available. FINDINGS: Bones/Joint/Cartilage Choose 1.  No bone destruction. Ligaments Suboptimally assessed by CT. Muscles and Tendons Negative Soft tissues Edema throughout the subcutaneous soft tissues of the right leg. No focal fluid collection to suggest abscess. Small nodules in  the subcutaneous soft tissues of the right buttock, likely injection granulomata. IMPRESSION: Edema throughout the subcutaneous soft tissues of the right lower extremity. No focal fluid collection to suggest abscess. No acute bony abnormality. Electronically Signed   By: Janeece Mechanic M.D.   On: 11/24/2023 18:07   CT ABDOMEN PELVIS WO CONTRAST Result Date: 11/24/2023 CLINICAL DATA:  Abdominal pain. Leg pain. Acute renal injury. No IV contrast administered. EXAM: CT ABDOMEN AND PELVIS WITHOUT CONTRAST TECHNIQUE: Multidetector CT imaging of the abdomen and pelvis was performed following the standard protocol without IV contrast. RADIATION DOSE REDUCTION: This exam was performed according to the departmental dose-optimization program which includes automated exposure control, adjustment of the mA and/or kV according to patient size and/or use of iterative reconstruction technique. COMPARISON:  None Available. FINDINGS: Lower chest: Lung bases are clear. Hepatobiliary: No focal hepatic lesion. Normal gallbladder. No biliary duct dilatation. Common bile duct is normal. Pancreas: Pancreas is normal. No ductal dilatation. No pancreatic inflammation. Spleen: Normal spleen Adrenals/urinary tract: Adrenal glands normal. There is mild renal edema on the RIGHT. LEFT kidney appears normal. No hydronephrosis. There is no obstructing calculi. Stomach/Bowel: Stomach, small bowel, appendix, and cecum are normal. The colon and rectosigmoid colon are normal. Vascular/Lymphatic: Abdominal aorta is normal caliber. No periportal or retroperitoneal adenopathy. No pelvic adenopathy. Reproductive: Unremarkable Other: None Musculoskeletal: No aggressive osseous lesion. Injection granuloma in the flanks over the buttocks. IMPRESSION: 1. Mild RIGHT renal edema. No hydronephrosis. No obstructing calculi. Indeterminate finding. Recommend clinical correlation for renal infection. 2. Normal LEFT kidney. 3. Normal appendix. Electronically Signed   By: Deboraha Fallow M.D.   On: 11/24/2023 17:40   DG Chest Portable 1 View Result Date: 11/24/2023 CLINICAL DATA:  I edema, tachycardia EXAM: PORTABLE CHEST 1 VIEW COMPARISON:  None Available. FINDINGS: Normal cardiac silhouette. Subtle airspace density in the RIGHT lower  lobe. Relatively low lung volumes. No pneumothorax. No acute osseous abnormality. IMPRESSION: Low lung volumes. Atelectasis versus infiltrate at the RIGHT lung base. Electronically Signed   By: Deboraha Fallow M.D.   On: 11/24/2023 13:47    [COMPLETED] nitroGLYCERIN       sevelamer carbonate  1,600 mg Oral TID WC   sodium zirconium cyclosilicate  10 g Oral Daily    BMET    Component Value Date/Time   NA 126 (L) 11/26/2023 0524   K 4.5 11/26/2023 0524   CL 79 (L) 11/26/2023 0524   CO2 19 (L) 11/26/2023 0524   GLUCOSE 106 (H) 11/26/2023 0524   BUN 150 (H) 11/26/2023 0524   CREATININE 12.49 (H) 11/26/2023 0524   CREATININE 1.29 09/26/2023 1455   CALCIUM 6.3 (LL) 11/26/2023 0524   GFRNONAA 5 (L) 11/26/2023 0524   GFRNONAA 92 07/23/2019 1426   GFRAA 58 (L) 03/05/2020 0345   GFRAA 106 07/23/2019 1426   CBC    Component Value Date/Time   WBC 13.6 (H) 11/26/2023 0524   RBC 3.76 (L) 11/26/2023 0524   HGB 11.5 (L) 11/26/2023 0524   HGB 16.1 08/20/2022 1424   HCT 30.7 (L) 11/26/2023 0524   HCT 47.8 08/20/2022 1424   PLT 209 11/26/2023 0524   PLT 313 08/20/2022 1424   MCV 81.6 11/26/2023 0524   MCV 93 08/20/2022 1424   MCH 30.6 11/26/2023 0524   MCHC 37.5 (H) 11/26/2023 0524   RDW 12.5 11/26/2023 0524   RDW 11.9 08/20/2022 1424   LYMPHSABS 1.7 11/24/2023 1300   LYMPHSABS 1.9 08/20/2022 1424   MONOABS 1.4 (H) 11/24/2023 1300  EOSABS 0.0 11/24/2023 1300   EOSABS 0.0 08/20/2022 1424   BASOSABS 0.0 11/24/2023 1300   BASOSABS 0.0 08/20/2022 1424    Assessment/Plan:   AKI- in the setting of severe rhabdomyolysis.  He was started on IVF's with some UOP but remains oliguric despite IV lasix.  We discussed that he is not responding and volume is getting worse along with his BUN/Cr.  Will consult IR for temp HD catheter and plan to initiate HD after it is placed.  Hopefully this will not be permanent.  Rhabdomyolysis - presumably due to acute meth intoxication.  C/o leg pain.   Continue to follow CK levels Acute bilateral DVT's - per primary svc Abnormal LFT's - due to meth and rhabdo.  Continue to follow. 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  AGMA - due to #1, on isotonic bicarb.   Benjamin Brands, MD [employer] [phone]

## 2023-11-26 NOTE — Procedures (Signed)
 Interventional Radiology Procedure Note  Procedure: RT internal jugular TEMP HD CATH    Complications: None  Estimated Blood Loss:  0  Findings: TIP SVCRA    M. TREVOR Maronda Caison, MD

## 2023-11-26 NOTE — Progress Notes (Signed)
 ANTICOAGULATION CONSULT NOTE  Pharmacy Consult for Heparin Indication: DVT  Allergies  Allergen Reactions   Haloperidol Other (See Comments)    Tardive dyskinesia    Patient Measurements: Height: 5\' 3"  (160 cm) Weight: 74.7 kg (164 lb 10.9 oz) IBW/kg (Calculated) : 56.9 Heparin Dosing Weight: 73 kg  Vital Signs: Temp: 97.8 F (36.6 C) (04/15 1939) Temp Source: Oral (04/15 1939) BP: 129/82 (04/15 1939) Pulse Rate: 91 (04/15 1939)  Labs: Recent Labs    11/24/23 1300 11/24/23 1333 11/24/23 1740 11/25/23 0325 11/25/23 0550 11/25/23 0736 11/25/23 1318 11/25/23 1723 11/25/23 1952 11/25/23 2357 11/26/23 0524 11/26/23 1900  HGB 15.2 15.0  --  12.9*  --   --   --   --   --   --  11.5*  --   HCT 42.2 44.0  --  35.3*  --   --   --   --   --   --  30.7*  --   PLT 271  --   --  216  --   --   --   --   --   --  209  --   HEPARINUNFRC  --   --   --   --    < >  --   --  0.22*  --   --  0.14* 0.44  CREATININE 10.81* 11.70*   < > 11.45*  --  11.58*   < > 12.10* 12.10* 11.37* 12.49*  --   CKTOTAL >50,000*  --   --   --   --  >50,000*  --   --   --   --  37,060*  --    < > = values in this interval not displayed.    Estimated Creatinine Clearance: 6.8 mL/min (A) (by C-G formula based on SCr of 12.49 mg/dL (H)).   Assessment: 38 yom with a history of bipolar disorder, HIV, previous thoracic outlet syndrome, anxiety, depression, IVDU (methamphetamines). Patient is presenting with several days of abdominal pain, bilateral leg pain right worse than left and bilateral leg swelling right worse than left. VAS US  significant for extensive bilateral DVTs. Heparin per pharmacy consult placed for DVT.  CBC stable with Hgb 11.5, platelets 209. Confirmed with RN, no issues with heparin infusion or interruptions in therapy overnight. No s/sx bleeding reported. Heparin levels remain subtherapeutic despite multiple bolus and heparin increases. Will check an antithrombin level for possible heparin  resistance.   2nd shift update: ATII is low at 63%, however heparin level returned therapeutic at 0.44.   Goal of Therapy:  Heparin level 0.3-0.7 units/ml Monitor platelets by anticoagulation protocol: Yes   Plan:  Continue heparin infusion at 1900 units/hr Check heparin level in 8 hours and daily while on heparin Continue to monitor H&H and platelets  Thank you for allowing pharmacy to be a part of this patient's care.  Patience Bonito, PharmD, BCPS, BCCCP Clinical Pharmacist

## 2023-11-26 NOTE — TOC Initial Note (Signed)
 Transition of Care [facility]) - Initial/Assessment Note    Patient Details  Name: Kenneth Hunt MRN: [id number] Date of Birth: [birth year]  Transition of Care [facility]) CM/SW Contact:    Delilah Shan, LCSWA Phone Number: 11/26/2023, 1:39 PM  Clinical Narrative:                  CSW received consult for possible SNF placement at time of discharge. CSW spoke with patient at bedside regarding PT recommendation of SNF placement at time of discharge. Patient reports PTA he comes from home with roommate.  Patient expressed understanding of PT recommendation and is agreeable to SNF placement at time of discharge. CSW informed patient that CSW will fax out initial referral for SNF placement closer to patient being medically ready for dc.  CSW discussed insurance authorization process and will provide Medicare SNF ratings list with accepted SNF bed offers when available. No further questions reported at this time. CSW to continue to follow and assist with discharge planning needs.    Expected Discharge Plan: Skilled Nursing Facility Barriers to Discharge: Continued Medical Work up   Patient Goals and CMS Choice Patient states their goals for this hospitalization and ongoing recovery are:: SNF   Choice offered to / list presented to : Patient      Expected Discharge Plan and Services In-house Referral: Clinical Social Work     Living arrangements for the past 2 months: Single Family Home                                      Prior Living Arrangements/Services Living arrangements for the past 2 months: Single Family Home Lives with:: Roommate Patient language and need for interpreter reviewed:: Yes Do you feel safe going back to the place where you live?: No   SNF  Need for Family Participation in Patient Care: Yes (Comment) Care giver support system in place?: Yes (comment)   Criminal Activity/Legal Involvement Pertinent to Current Situation/Hospitalization: No - Comment as  needed  Activities of Daily Living   ADL Screening (condition at time of admission) Independently performs ADLs?: Yes (appropriate for developmental age) Is the patient deaf or have difficulty hearing?: No Does the patient have difficulty seeing, even when wearing glasses/contacts?: No Does the patient have difficulty concentrating, remembering, or making decisions?: No  Permission Sought/Granted Permission sought to share information with : Case Manager, Family Supports, Oceanographer granted to share information with : Yes, Verbal Permission Granted     Permission granted to share info w AGENCY: SNF        Emotional Assessment Appearance:: Appears stated age Attitude/Demeanor/Rapport: Gracious Affect (typically observed): Calm Orientation: : Oriented to Self, Oriented to Place, Oriented to  Time, Oriented to Situation Alcohol / Substance Use: Not Applicable Psych Involvement: No (comment)  Admission diagnosis:  Peripheral edema [R60.0] Leg swelling [M79.89] Acute renal failure (ARF) (HCC) [N17.9] AKI (acute kidney injury) (HCC) [N17.9] LFT elevation [R79.89] Abdominal pain, unspecified abdominal location [R10.9] Patient Active Problem List   Diagnosis Date Noted   Hyponatremia 11/25/2023   Hyperkalemia 11/25/2023   DVT, lower extremity, distal, acute, bilateral (HCC) 11/25/2023   High transaminase levels 11/25/2023   Electrolyte disturbance 11/25/2023   Acute renal failure due to rhabdomyolysis (HCC) 11/24/2023   Seborrheic dermatitis 08/27/2023   Weight gain 08/27/2023   Gastroenteritis 05/08/2023   Left foot drop 04/24/2023   Blood blister 10/09/2022  Fatigue 08/20/2022   Encounter for screening involving social determinants of health (SDoH) 01/13/2021   Amphetamine and psychostimulant-induced psychosis with delusions (HCC)    Healthcare maintenance 10/17/2020   Hip pain, bilateral 10/17/2020   Methamphetamine dependence (HCC)  10/13/2020   Psychoactive substance-induced psychosis (HCC) 10/13/2020   Injection of illicit drug within last 12 months 08/26/2019   Bipolar 2 disorder (HCC) 03/04/2019   Asymptomatic HIV infection, CD4 >=500 (HCC) 10/01/2018   Thoracic outlet syndrome 10/01/2018   Brachial plexus disorders 07/31/2018   Methamphetamine use disorder, moderate, in early remission (HCC) 12/30/2017   Moderate recurrent major depression (HCC) 12/30/2017   History of syphilis 12/30/2017   Anxiety 02/16/2015   PCP:  Arellano Zameza, Priscila, MD Pharmacy:   [facility] [phone] - [place], [place] - [address] AT [facility] OF [address] & [address] [place] [address] Phone: [phone] Fax: [phone]     Social Drivers of Health (SDOH) Social History: SDOH Screenings   Food Insecurity: No Food Insecurity (11/25/2023)  Housing: Low Risk  (11/25/2023)  Transportation Needs: No Transportation Needs (11/25/2023)  Utilities: Not At Risk (11/25/2023)  Alcohol Screen: Low Risk  (10/09/2022)  Depression (PHQ2-9): Low Risk  (09/26/2023)  Financial Resource Strain: Low Risk  (10/09/2022)  Physical Activity: Inactive (10/09/2022)  Social Connections: Not on File (05/04/2023)   Received from [facility]  Stress: Stress Concern Present (10/09/2022)  Tobacco Use: Low Risk  (09/26/2023)   SDOH Interventions:     Readmission Risk Interventions     No data to display

## 2023-11-26 NOTE — Progress Notes (Signed)
 Interval history Since acute event overnight he is stable. He denies changes in pain levels but reports abdominal distension in his scrotum and lower abdominal area. He had a Temp right catheter placed by IR and will do HD today.  Physical exam Blood pressure 119/69, pulse 95, temperature 97.6 F (36.4 C), temperature source Oral, resp. rate 20, height 5\' 3"  (1.6 m), weight 77.1 kg, SpO2 97%.  General: Lying down in bed, in no acute distress in the room with his sisters Cardiovascular: regular rate and rhythm, no m/r/g, no pericardial rub appreciated   Pulmonary/Chest: He was able to take a deep breath and  lungs sounded clear to auscultation bilaterally. Dullness to percussion of b/l upper and lower lungs    Extremities: Bilateral right greater than left lower extremity edema, rising above the knees  Skin: His proximal arms were erythematous without swelling or TTP and he noted erythema of b/l Les with continued TTP Neurological: Alert and oriented Psych: Concerned mood, but appeared to be in less distress than yesterday   Weight change:    Intake/Output Summary (Last 24 hours) at 11/26/2023 1534 Last data filed at 11/26/2023 1246 Gross per 24 hour  Intake 520 ml  Output 900 ml  Net -380 ml   Net IO Since Admission: -341.43 mL [11/26/23 1534]  Labs, images, and other studies    Latest Ref Rng & Units 11/26/2023    5:24 AM 11/25/2023    3:25 AM 11/24/2023    1:33 PM  CBC  WBC 4.0 - 10.5 K/uL 13.6  13.4    Hemoglobin 13.0 - 17.0 g/dL 16.1  09.6  04.5   Hematocrit 39.0 - 52.0 % 30.7  35.3  44.0   Platelets 150 - 400 K/uL 209  216        Latest Ref Rng & Units 11/26/2023    5:24 AM 11/25/2023   11:57 PM 11/25/2023    7:52 PM  CMP  Glucose 70 - 99 mg/dL 409  811  914   BUN 6 - 20 mg/dL 782  956  213   Creatinine 0.61 - 1.24 mg/dL 08.65  78.46  96.29   Sodium 135 - 145 mmol/L 126  125  125   Potassium 3.5 - 5.1 mmol/L 4.5  3.9  4.1   Chloride 98 - 111 mmol/L  79  75  81   CO2 22 - 32 mmol/L 19  25  21    Calcium 8.9 - 10.3 mg/dL 6.3  5.5  5.8   Total Protein 6.5 - 8.1 g/dL 5.1     Total Bilirubin 0.0 - 1.2 mg/dL 1.2     Alkaline Phos 38 - 126 U/L 45     AST 15 - 41 U/L 501     ALT 0 - 44 U/L 301        Component Ref Range & Units (hover) 05:24 (11/26/23) 1 d ago (11/25/23) 1 d ago (11/25/23) 1 d ago (11/25/23) 1 d ago (11/25/23) 1 d ago (11/25/23) 1 d ago (11/25/23) 1 d ago (11/25/23)  Sodium 126 Low  125 Low  125 Low  124 Low  126 Low  125 Low  127 Low    Potassium 4.5 3.9 4.1 4.4 4.6 4.6 4.8   Chloride 79 Low  75 Low  81 Low  81 Low  83 Low  87 Low  86 Low    CO2 19 Low  25 21 Low  19 Low  19 Low  15 Low  16 Low    Glucose, Bld 106 High  108 High  CM 110 High  CM 108 High  CM 110 High  CM 132 High       Component Ref Range & Units (hover) 05:24 (11/26/23) 05:24 (11/26/23) 2 d ago (11/24/23) 2 d ago (11/24/23) 2 mo ago (09/26/23) 1 yr ago (02/07/22) 3 yr ago (11/12/20)  Total Protein 5.1 Low    6.9 7.2 R 7.2 R 7.2  Albumin 2.2 Low  2.2 Low  2.5 Low  3.1 Low    4.1  AST 501 High    1,578 High  26 R 15 R 32  ALT 301 High    688 High  31 R 16 R 32  Alkaline Phosphatase 45   63   67  Total Bilirubin 1.2   1.0 0.5 R 0.5 R 0.9 R  Bilirubin, Direct 0.2        Indirect Bilirubin 1.0 High           CK 50k --> 37k Antithrombin III low, 63 Heparin unfractionated level: 0.22--> 0.14  EKG 4/15: pending  Assessment and plan Hospital day 2  Kenneth Hunt is a 43 y.o. [name] is a 43 year old male with past medical history of bipolar 2 disorder, HIV, anxiety, depression, thoracic outlet syndrome, IV drug use (methamphetamine), most recent use 3 days ago who is presented to the ED for LE swelling/pain and oliguria 2/2 to rhabdomyolysis complicated by acute renal failure with high anion gap metabolic acidosis and electrolyte abnormalities.   Principal Problem: Acute renal failure 2/2 rhabdomyolysis Anion gap metabolic  acidosis Worsening. Though his CK is downtrending, oliguria has persisted, there are concerns for worsening volume overload and uremia and concerns for encephalopathy, nephology following. IR placed a temp R cath today for dialysis. Nephrology following and appreciate further recommendations.   - HD today  - Trend BMP - Strict I/Os - Bicarb 224ml/h per nephrology - Appreciate nephrology recommendations  - IV Lasix 80mg  q12h  Hyperkalemia Peaked T waves on EKG 4/14, got calcium gluconate 1000mg  x 2,  K+ 3.9-->4.5 today.  - Magnesium pending - EKG daily   Hyponatremia Na 125 stable . Likely 2/2 to decreased PO intake. Might be causing his blurred vision. Will monitor for seizure activity.  - Careful attention to avoid overcorrection and osmotic demyelination   Acute bilateral LEE deep vein thromboses  DVT ultrasound shows bilateral DVTs of proximal to mid left posterior tibial veins, and left peroneal veins. Pleuritic chest pain persists since yesterday, As DVTs are limited to vasculature distal to the knee, overall lower risk of embolization. Echo unremarkable. - Treatment dose heparin per pharmacy - Close attention to peripheral pulses - Dilaudid p.o./IV as needed  Elevated liver enzymes AST 1578, ALT 688. The breakdown of myocytes in rhabdomyolysis can cause elevated liver enzymes in a 2:1 AST:ALT ratio. No symptoms of hepatic encephalopathy. Will not routinely follow up LFTs at this time. Antihrombin III is low. GGT normal, reassuring that there is no acute liver injury. Hepatitis panel wnl.  Leukocytosis  Stable. 13.4 --> 13.6. Likely elevated in the setting of acute inflammation.  - Trend CBC  Bipolar 2 disorder Chronic. Pt on long-acting injectable aripiprazole (Aristada) for over 3 years.  - Patient recently received Aristada injection. No inpatient management at this time  HIV infection, CD4 >=800 Chronic. 02/25 labs show undetectable RNA and CD4 count >800.  On Biktarvy.  Holding given ARF.  -Restart Biktarvy when able.   Methamphetamine use, Injection of illicit drug within last 12 months - TOC follow up when nearing discahage   Thoracic Outlet Syndrome Chronic. Holding home gabapentin and duloxetine given ARF. Will restart when able.   VTE prophylaxis: IV heparin drip  Diet: Normal diet PT/OT rec: SNF placement  IVF: none Code: Full  PT/OT recommendations: PT prohibited at this time due to DVTs   This is a Psychologist, occupational Note.  The care of the patient was discussed with Dr. Esaw Heckler and the assessment and plan was formulated with their assistance.  Please see their note for official documentation of the patient encounter.   Signed: Jere Monaco, Medical Student 11/25/2023, 10:27 AM   I have seen and examined the patient myself, and I have reviewed the note by Alfornia Imam, MS3 and was present during the interview and physical exam.   Patient had temp cath placed by IR today, receiving HD currently. This will be the most important intervention in treating various electrolyte abnormalities and acute renal failure.   Signed: Jayson Michael, MD Internal Medicine Resident, PGY-1

## 2023-11-26 NOTE — Progress Notes (Signed)
  Progress Note   Date: 11/26/2023  Patient Name: Kenneth Hunt        MRN#: [id number]  Review of the patient's clinical findings supports the diagnosis of  Acute Kidney Injury

## 2023-11-26 NOTE — Progress Notes (Signed)
 ANTICOAGULATION CONSULT NOTE  Pharmacy Consult for Heparin Indication: DVT  Allergies  Allergen Reactions   Haloperidol Other (See Comments)    Tardive dyskinesia    Patient Measurements: Height: 5\' 3"  (160 cm) Weight: 77.1 kg (170 lb) IBW/kg (Calculated) : 56.9 Heparin Dosing Weight: 73 kg  Vital Signs: Temp: 97.6 F (36.4 C) (04/15 0419) Temp Source: Oral (04/15 0419) BP: 119/69 (04/15 0419) Pulse Rate: 95 (04/15 0419)  Labs: Recent Labs    11/24/23 1300 11/24/23 1333 11/24/23 1740 11/25/23 0325 11/25/23 0550 11/25/23 0736 11/25/23 1318 11/25/23 1723 11/25/23 1952 11/25/23 2357 11/26/23 0524  HGB 15.2 15.0  --  12.9*  --   --   --   --   --   --  11.5*  HCT 42.2 44.0  --  35.3*  --   --   --   --   --   --  30.7*  PLT 271  --   --  216  --   --   --   --   --   --  209  HEPARINUNFRC  --   --   --   --  0.10*  --   --  0.22*  --   --  0.14*  CREATININE 10.81* 11.70*   < > 11.45*  --  11.58*   < > 12.10* 12.10* 11.37* 12.49*  CKTOTAL >50,000*  --   --   --   --  >50,000*  --   --   --   --  37,060*   < > = values in this interval not displayed.    Estimated Creatinine Clearance: 6.9 mL/min (A) (by C-G formula based on SCr of 12.49 mg/dL (H)).   Assessment: 21 yom with a history of bipolar disorder, HIV, previous thoracic outlet syndrome, anxiety, depression, IVDU (methamphetamines). Patient is presenting with several days of abdominal pain, bilateral leg pain right worse than left and bilateral leg swelling right worse than left. VAS US  significant for extensive bilateral DVTs. Heparin per pharmacy consult placed for DVT.  CBC stable with Hgb 11.5, platelets 209. Confirmed with RN, no issues with heparin infusion or interruptions in therapy overnight. No s/sx bleeding reported. Heparin levels remain subtherapeutic despite multiple bolus and heparin increases. Will check an antithrombin level for possible heparin resistance.   Goal of Therapy:  Heparin level  0.3-0.7 units/ml Monitor platelets by anticoagulation protocol: Yes   Plan:   Give IV heparin 2000 unit bolus from bag x1 Increase heparin infusion to 1900 units/hr Check heparin level in 8 hours and daily while on heparin Continue to monitor H&H and platelets Check AT3 level for possible heparin resistance   Thank you for allowing pharmacy to be a part of this patient's care.  Claudia Cuff, PharmD, BCPS Clinical Pharmacist

## 2023-11-26 NOTE — Progress Notes (Signed)
   11/26/23 1905  Vitals  Temp 98.1 F (36.7 C)  Pulse Rate 99  Resp 16  BP 127/74  SpO2 96 %  O2 Device Nasal Cannula  Weight 74.7 kg  Type of Weight Post-Dialysis  Oxygen Therapy  O2 Flow Rate (L/min) 3 L/min  Patient Activity (if Appropriate) In bed  Pulse Oximetry Type Continuous  Oximetry Probe Site Changed No  Post Treatment  Dialyzer Clearance Lightly streaked  Hemodialysis Intake (mL) 0 mL  Liters Processed 36.7  Fluid Removed (mL) 2400 mL  Tolerated HD Treatment Yes   Received patient in bed to unit. ---this is the pts first tx Alert and oriented.  Informed consent signed and in chart.   TX duration 2.5  Patient tolerated well.  Transported back to the room  Alert, without acute distress.  Hand-off given to patient's nurse.   Access used: RIJHD catheter--piggy tail Access issues: no complications  Total UF removed: 2400 Medication(s) given: none [facility]

## 2023-11-26 NOTE — Evaluation (Signed)
 Occupational Therapy Evaluation Patient Details Name: Kenneth Hunt MRN: [id number] DOB: May 15, 1981 Today's Date: 11/26/2023   History of Present Illness   Kenneth Hunt is a 43 year old male who is presenting with several days of lower extremity swelling/pain and oliguria. PMH: bipolar disorder, HIV, anxiety, depression, thoracic outlet syndrome, IV drug use (methamphetamine), most recent use 3 days ago     Clinical Impressions The pt is currently presenting significantly below his baseline level of functioning for self-care management. He is limited by the below listed deficits (see OT problem list). At current, he requires min assist for supine to sit, max assist for lower body dressing, and mod assist to stand using a RW. He reported experiencing shortness of breath with activity, quick fatigue, B LE pain and tightness, and acute partial numbness and tingling of both feet. He will benefit from further OT services to maximize his functional independence and to decrease the risk for further weakness and deconditioning. Patient will benefit from continued inpatient follow up therapy, <3 hours/day.      If plan is discharge home, recommend the following:   A lot of help with walking and/or transfers;A lot of help with bathing/dressing/bathroom;Help with stairs or ramp for entrance;Assistance with cooking/housework;Assist for transportation     Functional Status Assessment   Patient has had a recent decline in their functional status and demonstrates the ability to make significant improvements in function in a reasonable and predictable amount of time.     Equipment Recommendations   Other (comment) (defer to next level of care)     Recommendations for Other Services         Precautions/Restrictions   Precautions Precautions: Fall Restrictions Weight Bearing Restrictions Per Provider Order: No     Mobility Bed Mobility Overal bed mobility: Needs  Assistance Bed Mobility: Supine to Sit, Sit to Supine     Supine to sit: Min assist, HOB elevated, Used rails Sit to supine: Min assist, HOB elevated, Used rails        Transfers Overall transfer level: Needs assistance Equipment used: Rolling walker (2 wheels) Transfers: Sit to/from Stand Sit to Stand: Mod assist                  Balance     Sitting balance-Leahy Scale: Fair       Standing balance-Leahy Scale: Poor           ADL either performed or assessed with clinical judgement   ADL Overall ADL's : Needs assistance/impaired Eating/Feeding: Set up;Sitting;Independent Eating/Feeding Details (indicate cue type and reason): simulated EOB Grooming: Set up;Sitting Grooming Details (indicate cue type and reason): simulated EOB         Upper Body Dressing : Minimal assistance;Sitting   Lower Body Dressing: Independent;Sitting/lateral leans       Toileting- Clothing Manipulation and Hygiene: Maximal assistance Toileting - Clothing Manipulation Details (indicate cue type and reason): at bedside commode level, based on clinical judgement              Pertinent Vitals/Pain Pain Assessment Pain Assessment: 0-10 Pain Score: 7  Pain Location: B LE Pain Intervention(s): Limited activity within patient's tolerance, Monitored during session, Repositioned     Extremity/Trunk Assessment Upper Extremity Assessment Upper Extremity Assessment: Overall WFL for tasks assessed;Right hand dominant   Lower Extremity Assessment Lower Extremity Assessment: Generalized weakness RLE Deficits / Details: Edema noted. Pt with acute foot drop LLE Deficits / Details: Edema noted       Communication Communication Communication:  No apparent difficulties   Cognition Arousal: Alert Behavior During Therapy: Anxious Cognition: No apparent impairments             OT - Cognition Comments: Oriented x4      Following commands: Intact                   Home Living   Living Arrangements: Non-relatives/Friends (roommate) Available Help at Discharge: Family;Friend(s) Type of Home: House Home Access: Stairs to enter [employer] of Steps: 2 Entrance Stairs-Rails: None Home Layout: One level     Bathroom Shower/Tub: Tub/shower unit         Home Equipment: None          Prior Functioning/Environment Prior Level of Function : Independent/Modified Independent             Mobility Comments: Independent with ambulation. ADLs Comments: He was independent with ADLs, cooking, and cleaning. He takes public transportation.    OT Problem List: Decreased strength;Decreased range of motion;Decreased activity tolerance;Impaired balance (sitting and/or standing);Decreased knowledge of use of DME or AE;Impaired sensation;Pain   OT Treatment/Interventions: Self-care/ADL training;Therapeutic exercise;Energy conservation;DME and/or AE instruction;Patient/family education;Balance training;Therapeutic activities      OT Goals(Current goals can be found in the care plan section)   Acute Rehab OT Goals Patient Stated Goal: "to be able to walk comfortably." OT Goal Formulation: With patient Time For Goal Achievement: 12/10/23 Potential to Achieve Goals: Good ADL Goals Pt Will Perform Lower Body Dressing: with contact guard assist;sitting/lateral leans;sit to/from stand Pt Will Transfer to Toilet: with contact guard assist;ambulating Pt Will Perform Toileting - Clothing Manipulation and hygiene: with contact guard assist;sit to/from stand   OT Frequency:  Min 2X/week       AM-PAC OT "6 Clicks" Daily Activity     Outcome Measure Help from another person eating meals?: None Help from another person taking care of personal grooming?: A Little Help from another person toileting, which includes using toliet, bedpan, or urinal?: A Lot Help from another person bathing (including washing, rinsing, drying)?: A Lot Help from  another person to put on and taking off regular upper body clothing?: A Little Help from another person to put on and taking off regular lower body clothing?: A Lot 6 Click Score: 16   End of Session Equipment Utilized During Treatment: Rolling walker (2 wheels);Gait belt;Oxygen Nurse Communication: Mobility status  Activity Tolerance: Patient limited by pain Patient left: in bed;with call bell/phone within reach;with bed alarm set;with family/visitor present  OT Visit Diagnosis: Unsteadiness on feet (R26.81);Muscle weakness (generalized) (M62.81);Pain                Time: 1422-1446 OT Time Calculation (min): 24 min Charges:  OT General Charges $OT Visit: 1 Visit OT Evaluation $OT Eval Moderate Complexity: 1 Mod   Amra Shukla L Mykela Mewborn, OTR/L 11/26/2023, 3:36 PM

## 2023-11-26 NOTE — Evaluation (Signed)
 Physical Therapy Evaluation Patient Details Name: Kenneth Hunt MRN: [id number] DOB: [birth year] Today's Date: 11/26/2023  History of Present Illness  Kenneth Hunt is a 43 year old male who is presenting with several days of lower extremity swelling/pain and oliguria.   PMH: bipolar disorder, HIV, anxiety, depression, thoracic outlet syndrome, IV drug use (methamphetamine), most recent use 3 days ago   Clinical Impression  Pt admitted with above. PTA pt was indep without AD and lived with a roommate. Pt now very edematous with numbness in bilat feet in addition to significant pain limiting pt ability to WB and ambulate. Pt needing RW and modA at this time to achieve standing and attempt side steps. Pt is starting HD and I am hopefull pt will recovery quickly as fluid is removed, however at this time recommending inpatient rehab program < 3 hrs a day to allow for increased time to achieve safe mod I level of function for safe transition home with roommate who works during the day. Acute PT to cont to follow.        If plan is discharge home, recommend the following: A lot of help with walking and/or transfers;Help with stairs or ramp for entrance;Assist for transportation;A lot of help with bathing/dressing/bathroom   Can travel by private vehicle   No    Equipment Recommendations  (TBD at next venue)  Recommendations for Other Services       Functional Status Assessment Patient has had a recent decline in their functional status and/or demonstrates limited ability to make significant improvements in function in a reasonable and predictable amount of time     Precautions / Restrictions Precautions Precautions: Fall Precaution/Restrictions Comments: very edematous Restrictions Weight Bearing Restrictions Per Provider Order: No      Mobility  Bed Mobility Overal bed mobility: Needs Assistance Bed Mobility: Supine to Sit, Sit to Supine     Supine to sit: Min assist,  HOB elevated, Used rails Sit to supine: Min assist, HOB elevated, Used rails   General bed mobility comments: increased time, labored effort, assist for LE management and to scoot hips to EOB, minA for LE management back into bed    Transfers Overall transfer level: Needs assistance Equipment used: Rolling walker (2 wheels) Transfers: Sit to/from Stand Sit to Stand: Mod assist           General transfer comment: modA to power up, very dependent on UEs due to onset of pain in LEs with WBing    Ambulation/Gait               General Gait Details: limited to, 2 side steps to [facility] due to pain. pt with noted R drop foot  Stairs            Wheelchair Mobility     Tilt Bed    Modified Rankin (Stroke Patients Only)       Balance Overall balance assessment: Needs assistance Sitting-balance support: Feet supported, Bilateral upper extremity supported Sitting balance-Leahy Scale: Fair     Standing balance support: Bilateral upper extremity supported, During functional activity, Reliant on assistive device for balance Standing balance-Leahy Scale: Poor Standing balance comment: reliant on RW                             Pertinent Vitals/Pain Pain Assessment Pain Assessment: 0-10 Pain Score: 10-Worst pain ever Pain Location: bilat LEs Pain Descriptors / Indicators: Burning, Sharp Pain Intervention(s): Monitored during session  Home Living Family/patient expects to be discharged to:: Private residence Living Arrangements: Non-relatives/Friends (states he has a roommate) Available Help at Discharge: Family;Friend(s);Available PRN/intermittently Type of Home: House Home Access: Level entry       Home Layout: One level Home Equipment: None      Prior Function Prior Level of Function : Independent/Modified Independent             Mobility Comments: doesn't drive, takes the bus or walks everywhere. [place] days of the  week to support groups for people with HIV, doesn't work, reports being on disability ADLs Comments: indep     Extremity/Trunk Assessment   Upper Extremity Assessment Upper Extremity Assessment: Overall WFL for tasks assessed    Lower Extremity Assessment Lower Extremity Assessment: Generalized weakness (bilat LEs with significant edema, unable to actively DF either foot, worse on R.)    Cervical / Trunk Assessment Cervical / Trunk Assessment: Other exceptions (large body habitus)  Communication   Communication Communication: No apparent difficulties    Cognition Arousal: Alert Behavior During Therapy: Anxious                           PT - Cognition Comments: pt very anxious regarding starting dialysis, pt asking many questions regarding procedure and how long he will need HD Following commands: Intact       Cueing Cueing Techniques: Verbal cues     General Comments General comments (skin integrity, edema, etc.): VSS, pt very edematous t/o body, especially LEs and feet    Exercises     Assessment/Plan    PT Assessment Patient needs continued PT services  PT Problem List Decreased strength;Decreased range of motion;Decreased activity tolerance;Decreased balance;Decreased mobility;Pain       PT Treatment Interventions DME instruction;Gait training;Stair training;Functional mobility training;Therapeutic activities;Therapeutic exercise;Balance training    PT Goals (Current goals can be found in the Care Plan section)  Acute Rehab PT Goals Patient Stated Goal: get better PT Goal Formulation: With patient Time For Goal Achievement: 12/10/23 Potential to Achieve Goals: Good    Frequency Min 2X/week     Co-evaluation               AM-PAC PT "6 Clicks" Mobility  Outcome Measure Help needed turning from your back to your side while in a flat bed without using bedrails?: A Little Help needed moving from lying on your back to sitting on the side of  a flat bed without using bedrails?: A Little Help needed moving to and from a bed to a chair (including a wheelchair)?: A Lot Help needed standing up from a chair using your arms (e.g., wheelchair or bedside chair)?: A Lot Help needed to walk in hospital room?: Total Help needed climbing 3-5 steps with a railing? : Total 6 Click Score: 12    End of Session Equipment Utilized During Treatment: Oxygen Activity Tolerance: Patient limited by fatigue;Patient limited by pain Patient left: in bed;with call bell/phone within reach (with transporter to take to IR) Nurse Communication: Mobility status (and anxiety regarding HD) PT Visit Diagnosis: Unsteadiness on feet (R26.81);Difficulty in walking, not elsewhere classified (R26.2);Pain Pain - Right/Left: Right (bilat LEs) Pain - part of body: Leg    Time: 1610-9604 PT Time Calculation (min) (ACUTE ONLY): 22 min   Charges:   PT Evaluation $PT Eval Moderate Complexity: 1 Mod   PT General Charges $$ ACUTE PT VISIT: 1 Visit  Kenneth Hunt, PT, DPT Acute Rehabilitation Services Secure chat preferred Office #: [phone]   Jenna Moan 11/26/2023, 11:55 AM

## 2023-11-27 ENCOUNTER — Other Ambulatory Visit (HOSPITAL_COMMUNITY): Payer: Self-pay

## 2023-11-27 ENCOUNTER — Encounter (HOSPITAL_COMMUNITY): Payer: Self-pay | Admitting: Internal Medicine

## 2023-11-27 ENCOUNTER — Telehealth (HOSPITAL_COMMUNITY): Payer: Self-pay | Admitting: Pharmacy Technician

## 2023-11-27 DIAGNOSIS — N179 Acute kidney failure, unspecified: Secondary | ICD-10-CM | POA: Diagnosis not present

## 2023-11-27 DIAGNOSIS — E8721 Acute metabolic acidosis: Secondary | ICD-10-CM | POA: Diagnosis not present

## 2023-11-27 DIAGNOSIS — M6282 Rhabdomyolysis: Secondary | ICD-10-CM | POA: Diagnosis not present

## 2023-11-27 DIAGNOSIS — E875 Hyperkalemia: Secondary | ICD-10-CM | POA: Diagnosis not present

## 2023-11-27 LAB — CALCIUM, IONIZED: Calcium, Ionized, Serum: 3.3 mg/dL — ABNORMAL LOW (ref 4.5–5.6)

## 2023-11-27 LAB — HEPARIN LEVEL (UNFRACTIONATED): Heparin Unfractionated: 0.22 [IU]/mL — ABNORMAL LOW (ref 0.30–0.70)

## 2023-11-27 LAB — RENAL FUNCTION PANEL
Albumin: 2.1 g/dL — ABNORMAL LOW (ref 3.5–5.0)
Anion gap: 19 — ABNORMAL HIGH (ref 5–15)
BUN: 109 mg/dL — ABNORMAL HIGH (ref 6–20)
CO2: 23 mmol/L (ref 22–32)
Calcium: 6.5 mg/dL — ABNORMAL LOW (ref 8.9–10.3)
Chloride: 84 mmol/L — ABNORMAL LOW (ref 98–111)
Creatinine, Ser: 10.3 mg/dL — ABNORMAL HIGH (ref 0.61–1.24)
GFR, Estimated: 6 mL/min — ABNORMAL LOW (ref >60)
Glucose, Bld: 110 mg/dL — ABNORMAL HIGH (ref 70–99)
Phosphorus: 10 mg/dL — ABNORMAL HIGH (ref 2.5–4.6)
Potassium: 4.2 mmol/L (ref 3.5–5.1)
Sodium: 126 mmol/L — ABNORMAL LOW (ref 135–145)

## 2023-11-27 LAB — CK: Total CK: 22353 U/L — ABNORMAL HIGH (ref 49–397)

## 2023-11-27 MED ORDER — HEPARIN BOLUS VIA INFUSION
1000.0000 [IU] | Freq: Once | INTRAVENOUS | Status: AC
  Administered 2023-11-27: 1000 [IU] via INTRAVENOUS
  Filled 2023-11-27: qty 1000

## 2023-11-27 MED ORDER — APIXABAN 5 MG PO TABS
5.0000 mg | ORAL_TABLET | Freq: Two times a day (BID) | ORAL | Status: DC
  Administered 2023-12-04 – 2023-12-10 (×13): 5 mg via ORAL
  Filled 2023-11-27 (×14): qty 1

## 2023-11-27 MED ORDER — APIXABAN 5 MG PO TABS
10.0000 mg | ORAL_TABLET | Freq: Two times a day (BID) | ORAL | Status: AC
  Administered 2023-11-27 – 2023-12-03 (×14): 10 mg via ORAL
  Filled 2023-11-27 (×14): qty 2

## 2023-11-27 MED ORDER — ACETAMINOPHEN 325 MG PO TABS
650.0000 mg | ORAL_TABLET | Freq: Three times a day (TID) | ORAL | Status: DC
  Administered 2023-11-27 – 2023-12-09 (×36): 650 mg via ORAL
  Filled 2023-11-27 (×36): qty 2

## 2023-11-27 NOTE — Telephone Encounter (Signed)
 Patient Product/process development scientist completed.    The patient is insured through [employer]. Patient has Medicare and is not eligible for a copay card, but may be able to apply for patient assistance or Medicare RX Payment Plan (Patient Must reach out to their plan, if eligible for payment plan), if available.    Ran test claim for Eliquis 5 mg and the current 30 day co-pay is $0.00.   This test claim was processed through [facility]- copay amounts may vary at other pharmacies due to pharmacy/plan contracts, or as the patient moves through the different stages of their insurance plan.     Morgan Arab, CPHT Pharmacy Technician III Certified Patient Advocate [facility] Patient Advocate Team Direct Number: [phone]  Fax: [phone]

## 2023-11-27 NOTE — Progress Notes (Signed)
 Physical Therapy Treatment Patient Details Name: Kenneth Hunt MRN: [id number] DOB: Mar 24, 1981 Today's Date: 11/27/2023   History of Present Illness Kenneth Hunt is a 43 year old male who is presenting with several days of lower extremity swelling/pain and oliguria.   PMH: bipolar disorder, HIV, anxiety, depression, thoracic outlet syndrome, IV drug use (methamphetamine), most recent use 3 days ago    PT Comments  Pt supine in bed on entry, in frog leg position. Provided education on neutral positioning of hips, and increased LE muscle activation to promote fluid movement. Pt concerned about foot drop, discussed possibility of nerve compression due to increased fluid pressure and encouraged constant foot movement even if it is not full ROM at ankle. Provided B heel cord stretch. Pt is supervision to come to EoB, modA for coming to standing from Bed and BSC, min A for stepping transfer to and from [facility] and lateral stepping towards HoB. Unable to progress ambulation due to increased fatigue and pain in LE from sitting on BSC. Reinforced exercises to be done throughout the day. D/c plan remains appropriate. PT will continue to follow acutely.     If plan is discharge home, recommend the following: A lot of help with walking and/or transfers;Help with stairs or ramp for entrance;Assist for transportation;A lot of help with bathing/dressing/bathroom   Can travel by private vehicle     No  Equipment Recommendations   (TBD at next venue)       Precautions / Restrictions Precautions Precautions: Fall Precaution/Restrictions Comments: very edematous Restrictions Weight Bearing Restrictions Per Provider Order: No     Mobility  Bed Mobility Overal bed mobility: Needs Assistance Bed Mobility: Supine to Sit, Sit to Supine     Supine to sit: HOB elevated, Used rails, Supervision Sit to supine: HOB elevated, Used rails, Supervision   General bed mobility comments: with increased time  and effort and use of bed rails pt able to come to EoB and return to supine    Transfers Overall transfer level: Needs assistance Equipment used: Rolling walker (2 wheels) Transfers: Sit to/from Stand, Bed to chair/wheelchair/BSC Sit to Stand: Mod assist   Step pivot transfers: Min assist       General transfer comment: modA to power up, from elevated bed and BSC, cotinues to depend on increased UE support on RW to reduce weighbearing thropugh his LE., min A for step pivot to and from [facility]    Ambulation/Gait               General Gait Details: limited to, 2 side steps to [facility] due to pain. pt with noted R drop foot         Balance Overall balance assessment: Needs assistance Sitting-balance support: Feet supported, Bilateral upper extremity supported Sitting balance-Leahy Scale: Fair     Standing balance support: Bilateral upper extremity supported, During functional activity, Reliant on assistive device for balance Standing balance-Leahy Scale: Poor Standing balance comment: reliant on RW                            Communication Communication Communication: No apparent difficulties  Cognition Arousal: Alert Behavior During Therapy: Anxious                           PT - Cognition Comments: anxious about L foot drop and increased "dead" feeling in his legs, provided education about fluid overload and the benefits of increase  muscle activation to move fluid Following commands: Intact      Cueing Cueing Techniques: Verbal cues  Exercises General Exercises - Lower Extremity Ankle Circles/Pumps: AROM, Both, 10 reps (limited on R ankle) Quad Sets: AROM, Both, 5 reps (with 5 sec hold) Gluteal Sets: AROM, Both, 5 reps (with 5 sec hold) Heel Slides: AROM, Both, 5 reps Hip ABduction/ADduction: AROM, Both, 5 reps (with 5 sec hold) Other Exercises Other Exercises: hip bridging x 10    General Comments General comments (skin integrity, edema,  etc.): VSS, pt continues to have increased LE edema      Pertinent Vitals/Pain Pain Assessment Pain Assessment: 0-10 Pain Score: 7  Pain Location: bilat LEs Pain Descriptors / Indicators: Burning, Sharp Pain Intervention(s): Monitored during session, Repositioned (pain reduced to 4 after mobilization)     PT Goals (current goals can now be found in the care plan section) Acute Rehab PT Goals Patient Stated Goal: get better PT Goal Formulation: With patient Time For Goal Achievement: 12/10/23 Potential to Achieve Goals: Good Progress towards PT goals: Progressing toward goals    Frequency    Min 2X/week       AM-PAC PT "6 Clicks" Mobility   Outcome Measure  Help needed turning from your back to your side while in a flat bed without using bedrails?: A Little Help needed moving from lying on your back to sitting on the side of a flat bed without using bedrails?: A Little Help needed moving to and from a bed to a chair (including a wheelchair)?: A Lot Help needed standing up from a chair using your arms (e.g., wheelchair or bedside chair)?: A Lot Help needed to walk in hospital room?: Total Help needed climbing 3-5 steps with a railing? : Total 6 Click Score: 12    End of Session Equipment Utilized During Treatment: Oxygen Activity Tolerance: Patient limited by pain Patient left: in bed;with call bell/phone within reach (foot of bed elevated above his heart, with instructions on how to reposition bed if he gets uncomfortable) Nurse Communication: Mobility status PT Visit Diagnosis: Unsteadiness on feet (R26.81);Difficulty in walking, not elsewhere classified (R26.2);Pain Pain - Right/Left: Right (bilat LEs) Pain - part of body: Leg     Time: 1610-9604 PT Time Calculation (min) (ACUTE ONLY): 50 min  Charges:    $Therapeutic Exercise: 8-22 mins $Therapeutic Activity: 8-22 mins $Self Care/Home Management: 8-22 PT General Charges $$ ACUTE PT VISIT: 1 Visit                      Lumen Brinlee B. Jewel Mortimer PT, DPT Acute Rehabilitation Services Please use secure chat or  Call Office [phone]    Verlie Glisson Fleet 11/27/2023, 11:18 AM

## 2023-11-27 NOTE — NC FL2 (Signed)
 [place] MEDICAID FL2 LEVEL OF CARE FORM     IDENTIFICATION  Patient Name: Kenneth Hunt Birthdate: November 16, 1980 Sex: male Admission Date (Current Location): 11/24/2023  [facility] [place] and [place] Number:  Producer, television/film/video and Address:  [facility], [address], [place], [place] [address]      Provider Number: [id number]  Attending Physician Name and Address:  Miguel Aschoff, MD  Relative Name and Phone Number:  Okey Dupre (sister) [phone]    Current Level of Care: Hospital Recommended Level of Care: Skilled Nursing Facility Prior Approval Number:    Date Approved/Denied:   PASRR Number: PASRR under review  Discharge Plan: SNF    Current Diagnoses: Patient Active Problem List   Diagnosis Date Noted   Hyponatremia 11/25/2023   Hyperkalemia 11/25/2023   DVT, lower extremity, distal, acute, bilateral (HCC) 11/25/2023   High transaminase levels 11/25/2023   Electrolyte disturbance 11/25/2023   Acute renal failure due to rhabdomyolysis (HCC) 11/24/2023   Seborrheic dermatitis 08/27/2023   Weight gain 08/27/2023   Gastroenteritis 05/08/2023   Left foot drop 04/24/2023   Blood blister 10/09/2022   Fatigue 08/20/2022   Encounter for screening involving social determinants of health (SDoH) 01/13/2021   Amphetamine and psychostimulant-induced psychosis with delusions (HCC)    Healthcare maintenance 10/17/2020   Hip pain, bilateral 10/17/2020   Methamphetamine dependence (HCC) 10/13/2020   Psychoactive substance-induced psychosis (HCC) 10/13/2020   Injection of illicit drug within last 12 months 08/26/2019   Bipolar 2 disorder (HCC) 03/04/2019   Asymptomatic HIV infection, CD4 >=500 (HCC) 10/01/2018   Thoracic outlet syndrome 10/01/2018   Brachial plexus disorders 07/31/2018   Methamphetamine use disorder, moderate, in early remission (HCC) 12/30/2017   Moderate recurrent major depression (HCC) 12/30/2017   History of syphilis  12/30/2017   Anxiety 02/16/2015    Orientation RESPIRATION BLADDER Height & Weight     Self, Time, Situation, Place  O2 (Nasal Cannula 3 liters) Continent Weight: 164 lb 10.9 oz (74.7 kg) Height:  5\' 3"  (160 cm)  BEHAVIORAL SYMPTOMS/MOOD NEUROLOGICAL BOWEL NUTRITION STATUS      Continent Diet (Please see discharge summary)  AMBULATORY STATUS COMMUNICATION OF NEEDS Skin   Extensive Assist Verbally Other (Comment) (Erythema,Leg,Bil,scratch marks,)                       Personal Care Assistance Level of Assistance  Bathing, Dressing Bathing Assistance: Maximum assistance   Dressing Assistance: Maximum assistance     Functional Limitations Info  Sight, Hearing, Speech Sight Info: Adequate Hearing Info: Adequate Speech Info: Adequate    SPECIAL CARE FACTORS FREQUENCY  PT (By licensed PT), OT (By licensed OT)     PT Frequency: 5x min weekly OT Frequency: 5x min weekly            Contractures Contractures Info: Not present    Additional Factors Info  Code Status, Allergies Code Status Info: FULL Allergies Info: Haloperidol           Current Medications (11/27/2023):  This is the current hospital active medication list Current Facility-Administered Medications  Medication Dose Route Frequency Provider Last Rate Last Admin   acetaminophen (TYLENOL) tablet 650 mg  650 mg Oral Q8H Monna Fam, MD   650 mg at 11/27/23 1441   albuterol (PROVENTIL) (2.5 MG/3ML) 0.083% nebulizer solution 2.5 mg  2.5 mg Nebulization Q2H PRN Gwenevere Abbot, MD   2.5 mg at 11/25/23 2307   apixaban (ELIQUIS) tablet 10 mg  10 mg Oral BID Jayson Michael, MD   10 mg at 11/27/23 1152   Followed by   Cecily Cohen ON 12/04/2023] apixaban (ELIQUIS) tablet 5 mg  5 mg Oral BID Jayson Michael, MD       bisacodyl (DULCOLAX) EC tablet 5 mg  5 mg Oral Daily PRN Khan, Ghalib, MD       Chlorhexidine Gluconate Cloth 2 % PADS 6 each  6 each Topical Q0600 Charley Constable, MD   6 each at 11/27/23 0516   furosemide  (LASIX) 120 mg in dextrose 5 % 50 mL IVPB  120 mg Intravenous Q12H Charley Constable, MD 62 mL/hr at 11/27/23 0515 120 mg at 11/27/23 0515   HYDROmorphone (DILAUDID) injection 0.5 mg  0.5 mg Intravenous Q2H PRN Khan, Ghalib, MD   0.5 mg at 11/26/23 1552   HYDROmorphone (DILAUDID) tablet 1 mg  1 mg Oral Q4H PRN Khan, Ghalib, MD   1 mg at 11/25/23 2029   nitroGLYCERIN (NITROSTAT) SL tablet 0.4 mg  0.4 mg Sublingual Q5 min PRN Sherol Dixie, MD   0.4 mg at 11/25/23 2300   sevelamer carbonate (RENVELA) tablet 1,600 mg  1,600 mg Oral TID WC Jackolyn Masker, MD   1,600 mg at 11/27/23 1300   sodium bicarbonate 150 mEq in sterile water 1,150 mL infusion   Intravenous Continuous Jayson Michael, MD 50 mL/hr at 11/27/23 1301 Rate Change at 11/27/23 1301   sodium zirconium cyclosilicate (LOKELMA) packet 10 g  10 g Oral Daily Sheree Dieter, MD   10 g at 11/27/23 1100     Discharge Medications: Please see discharge summary for a list of discharge medications.  Relevant Imaging Results:  Relevant Lab Results:   Additional Information [id number]-[id number]  Carmon Christen, LCSWA

## 2023-11-27 NOTE — Progress Notes (Signed)
 Patient ID: Kenneth Hunt, male   DOB: 11/16/1980, 43 y.o.   MRN: [id number] S: Still feels tight on his legs and chest but improved after HD yesterday. O:BP 118/73 (BP Location: Left Arm)   Pulse 97   Temp (!) 97.5 F (36.4 C) (Oral)   Resp 19   Ht 5\' 3"  (1.6 m)   Wt 74.7 kg   SpO2 96%   BMI 29.17 kg/m   Intake/Output Summary (Last 24 hours) at 11/27/2023 1132 Last data filed at 11/27/2023 0510 Gross per 24 hour  Intake 360 ml  Output 5850 ml  Net -5490 ml   Intake/Output: I/O last 3 completed shifts: In: 880 [P.O.:880] Out: 6200 [Urine:1400; Other:4800]  Intake/Output this shift:  No intake/output data recorded. Weight change:  Gen: NAD CVS: RRR Resp:CTA Abd: +BS, soft, NT Ext: 3+ tense edema BLE  Recent Labs  Lab 11/24/23 1300 11/24/23 1333 11/24/23 1740 11/24/23 1952 11/25/23 0325 11/25/23 0736 11/25/23 1318 11/25/23 1723 11/25/23 1952 11/25/23 2357 11/26/23 0524 11/27/23 0509  NA 123*   < > 125*   < > 127* 125* 126* 124* 125* 125* 126* 126*  K 6.5*   < > 5.6*   < > 4.8 4.6 4.6 4.4 4.1 3.9 4.5 4.2  CL 87*   < > 91*   < > 86* 87* 83* 81* 81* 75* 79* 84*  CO2 12*  --  11*   < > 16* 15* 19* 19* 21* 25 19* 23  GLUCOSE 94   < > 91   < > 96 132* 110* 108* 110* 108* 106* 110*  BUN 127*   < > 128*   < > 132* 139* 141* 143* 144* 138* 150* 109*  CREATININE 10.81*   < > 10.95*   < > 11.45* 11.58* 11.69* 12.10* 12.10* 11.37* 12.49* 10.30*  ALBUMIN 3.1*  --  2.5*  --   --   --   --   --   --   --  2.2*  2.2* 2.1*  CALCIUM 6.4*  --  6.2*   < > 6.2* 5.9* 6.0* 6.0* 5.8* 5.5* 6.3* 6.5*  PHOS  --   --  9.2*  --  10.2*  --   --   --   --   --  11.4* 10.0*  AST 1,578*  --   --   --   --   --   --   --   --   --  501*  --   ALT 688*  --   --   --   --   --   --   --   --   --  301*  --    < > = values in this interval not displayed.   Liver Function Tests: Recent Labs  Lab 11/24/23 1300 11/24/23 1740 11/26/23 0524 11/27/23 0509  AST 1,578*  --  501*  --   ALT  688*  --  301*  --   ALKPHOS 63  --  45  --   BILITOT 1.0  --  1.2  --   PROT 6.9  --  5.1*  --   ALBUMIN 3.1* 2.5* 2.2*  2.2* 2.1*   Recent Labs  Lab 11/24/23 1516  LIPASE 141*   No results for input(s): "AMMONIA" in the last 168 hours. CBC: Recent Labs  Lab 11/24/23 1300 11/24/23 1333 11/25/23 0325 11/26/23 0524  WBC 18.9*  --  13.4* 13.6*  NEUTROABS 15.7*  --   --   --  HGB 15.2 15.0 12.9* 11.5*  HCT 42.2 44.0 35.3* 30.7*  MCV 84.7  --  84.0 81.6  PLT 271  --  216 209   Cardiac Enzymes: Recent Labs  Lab 11/24/23 1300 11/25/23 0736 11/26/23 0524  CKTOTAL >50,000* >50,000* 37,060*   CBG: No results for input(s): "GLUCAP" in the last 168 hours.  Iron Studies: No results for input(s): "IRON", "TIBC", "TRANSFERRIN", "FERRITIN" in the last 72 hours. Studies/Results: IR Fluoro Guide CV Line Right Result Date: 11/26/2023 INDICATION: Acute renal failure secondary to rhabdomyolysis EXAM: ULTRASOUND FLUOROSCOPIC RIGHT IJ TEMPORARY DIALYSIS CATHETER (20 CM MAHURKAR CATHETER) MEDICATIONS: 1% LIDOCAINE LOCAL ANESTHESIA/SEDATION: None. FLUOROSCOPY: Radiation Exposure Index (as provided by the fluoroscopic device): 1.0 mGy Kerma COMPLICATIONS: None immediate. PROCEDURE: Informed written consent was obtained from the patient after a thorough discussion of the procedural risks, benefits and alternatives. All questions were addressed. Maximal Sterile Barrier Technique was utilized including caps, mask, sterile gowns, sterile gloves, sterile drape, hand hygiene and skin antiseptic. A timeout was performed prior to the initiation of the procedure. Under sterile conditions and local anesthesia, ultrasound micropuncture access performed the right internal jugular vein. Images obtained for documentation of the patent right internal jugular vein. 018 guidewire inserted followed by the 4 [place] transitional dilator. Amplatz guidewire advanced into the IVC. Measurements obtained for the appropriate  length. Tract dilatation performed to insert a 20 cm temporary Mahurkar dialysis catheter. Tip positioned in the proximal right atrium. Blood aspirated easily followed by saline and heparin flushes. External caps applied to all lumens. Catheter secured with prolene sutures and a sterile dressing. No immediate complication. Patient tolerated the procedure well. Access ready for use. IMPRESSION: Successful ultrasound fluoroscopic right IJ temporary dialysis catheter placement. Electronically Signed   By: Judie Petit.  Shick M.D.   On: 11/26/2023 14:06   IR US Guide Vasc Access Right Result Date: 11/26/2023 INDICATION: Acute renal failure secondary to rhabdomyolysis EXAM: ULTRASOUND FLUOROSCOPIC RIGHT IJ TEMPORARY DIALYSIS CATHETER (20 CM MAHURKAR CATHETER) MEDICATIONS: 1% LIDOCAINE LOCAL ANESTHESIA/SEDATION: None. FLUOROSCOPY: Radiation Exposure Index (as provided by the fluoroscopic device): 1.0 mGy Kerma COMPLICATIONS: None immediate. PROCEDURE: Informed written consent was obtained from the patient after a thorough discussion of the procedural risks, benefits and alternatives. All questions were addressed. Maximal Sterile Barrier Technique was utilized including caps, mask, sterile gowns, sterile gloves, sterile drape, hand hygiene and skin antiseptic. A timeout was performed prior to the initiation of the procedure. Under sterile conditions and local anesthesia, ultrasound micropuncture access performed the right internal jugular vein. Images obtained for documentation of the patent right internal jugular vein. 018 guidewire inserted followed by the 4 [place] transitional dilator. Amplatz guidewire advanced into the IVC. Measurements obtained for the appropriate length. Tract dilatation performed to insert a 20 cm temporary Mahurkar dialysis catheter. Tip positioned in the proximal right atrium. Blood aspirated easily followed by saline and heparin flushes. External caps applied to all lumens. Catheter secured with  prolene sutures and a sterile dressing. No immediate complication. Patient tolerated the procedure well. Access ready for use. IMPRESSION: Successful ultrasound fluoroscopic right IJ temporary dialysis catheter placement. Electronically Signed   By: Judie Petit.  Shick M.D.   On: 11/26/2023 14:06   DG CHEST PORT 1 VIEW Result Date: 11/25/2023 CLINICAL DATA:  Pleuritic chest pain and increasing oxygen requirement EXAM: PORTABLE CHEST 1 VIEW COMPARISON:  11/24/2023 FINDINGS: Cardiac shadow is within normal limits. The overall inspiratory effort is poor with crowding of the vascular markings. Mild central vascular congestion is seen as well as  increasing left basilar atelectasis. No bony abnormality is noted. IMPRESSION: Mild vascular congestion with increasing left basilar atelectasis. Electronically Signed   By: Violeta Grey M.D.   On: 11/25/2023 23:38    acetaminophen  650 mg Oral Q8H   Chlorhexidine Gluconate Cloth  6 each Topical Q0600   sevelamer carbonate  1,600 mg Oral TID WC   sodium zirconium cyclosilicate  10 g Oral Daily    BMET    Component Value Date/Time   NA 126 (L) 11/27/2023 0509   K 4.2 11/27/2023 0509   CL 84 (L) 11/27/2023 0509   CO2 23 11/27/2023 0509   GLUCOSE 110 (H) 11/27/2023 0509   BUN 109 (H) 11/27/2023 0509   CREATININE 10.30 (H) 11/27/2023 0509   CREATININE 1.29 09/26/2023 1455   CALCIUM 6.5 (L) 11/27/2023 0509   GFRNONAA 6 (L) 11/27/2023 0509   GFRNONAA 92 07/23/2019 1426   GFRAA 58 (L) 03/05/2020 0345   GFRAA 106 07/23/2019 1426   CBC    Component Value Date/Time   WBC 13.6 (H) 11/26/2023 0524   RBC 3.76 (L) 11/26/2023 0524   HGB 11.5 (L) 11/26/2023 0524   HGB 16.1 08/20/2022 1424   HCT 30.7 (L) 11/26/2023 0524   HCT 47.8 08/20/2022 1424   PLT 209 11/26/2023 0524   PLT 313 08/20/2022 1424   MCV 81.6 11/26/2023 0524   MCV 93 08/20/2022 1424   MCH 30.6 11/26/2023 0524   MCHC 37.5 (H) 11/26/2023 0524   RDW 12.5 11/26/2023 0524   RDW 11.9 08/20/2022 1424    LYMPHSABS 1.7 11/24/2023 1300   LYMPHSABS 1.9 08/20/2022 1424   MONOABS 1.4 (H) 11/24/2023 1300   EOSABS 0.0 11/24/2023 1300   EOSABS 0.0 08/20/2022 1424   BASOSABS 0.0 11/24/2023 1300   BASOSABS 0.0 08/20/2022 1424    Assessment/Plan:   AKI- in the setting of severe rhabdomyolysis.  He was started on IVF's with some UOP but remains oliguric despite IV lasix.  He underwent RIJ temp HD catheter by IR followed by first HD session yesterday due to lack of response to IVF's.  Able to UF 2 liters with HD and his UOP picked up to over a liter.  Will hold off on another session of HD today but may need another tomorrow.  Hopefully this will not be permanent.  Continue to follow daily UOP and BUN/Cr. Rhabdomyolysis - presumably due to acute meth intoxication.  C/o leg pain.  Continue to follow CK levels Acute bilateral DVT's - per primary svc Abnormal LFT's - due to meth and rhabdo.  Continue to follow. 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  AGMA - due to #1, on isotonic bicarb.   Benjamin Brands, MD [facility]

## 2023-11-27 NOTE — Progress Notes (Addendum)
 Interval history HD was 2 hours and well tolerated. removed.  He reports sleeping during dialysis, when he woke up after dialysis he felt "off" but not much better afterwards. He is able to make urine on his own now. In the room his mood seemed more elevated. Currently he feels the same as before, his legs still feel like they are "stuck" and he is restless. He reports no improvement in his pleuritic chest pain, still finds it hard to catch a breath. Denies improvement in his leg pain with some burning pain, but has no change in sensation since yesterday. He still feels distended in his abdomen. He continues to complain of difficulty dorsiflexing his R foot on his own, and wants someone to move his R ankle around so it will feel less stiff. Placed a pillow under his R leg for better elevation.  Physical exam Blood pressure 118/73, pulse 97, temperature (!) 97.5 F (36.4 C), temperature source Oral, resp. rate 19, height 5\' 3"  (1.6 m), weight 74.7 kg, SpO2 96%.  General: He appears comfortable and is lying down in bed, legs were no elevated so we raised them towards the end of the visit  Cardiovascular: regular rate and rhythm, no m/r/g Pulmonary/Chest: He was able to take a deep breath and lungs sounded clear to auscultation bilaterally Abd: Abdomen felt distended with dullness to percussion.  Extremities: His R LE and L left LE remain distended R>L, the edema in the R leg continues to be seen above the knee, L leg swelling remains distal to the knee. Erythema notable on the anterior aspect of the R leg, bullae on the dorsal aspect of the R leg are stable, no evidence of rupture. He remains tender to palpation. He is able to feel sensation to Light touch in L>R LE. His peripheral pulses remain palpable.  MSK: 0/5 on dorsiflexion of R ankle, can wiggle his toes.  Neurological: Alert and oriented Psych: Mood seems better than yesterday, though he is still worried about  symptoms  Weight change:    Intake/Output Summary (Last 24 hours) at 11/27/2023 1332 Last data filed at 11/27/2023 0510 Gross per 24 hour  Intake 360 ml  Output 5500 ml  Net -5140 ml   Net IO Since Admission: -5,481.43 mL [11/27/23 1332]  Labs, images, and other studies    Latest Ref Rng & Units 11/26/2023    5:24 AM 11/25/2023    3:25 AM 11/24/2023    1:33 PM  CBC  WBC 4.0 - 10.5 K/uL 13.6  13.4    Hemoglobin 13.0 - 17.0 g/dL 16.1  09.6  04.5   Hematocrit 39.0 - 52.0 % 30.7  35.3  44.0   Platelets 150 - 400 K/uL 209  216        Latest Ref Rng & Units 11/27/2023    5:09 AM 11/26/2023    5:24 AM 11/25/2023   11:57 PM  CMP  Glucose 70 - 99 mg/dL 409  811  914   BUN 6 - 20 mg/dL 782  956  213   Creatinine 0.61 - 1.24 mg/dL 08.65  78.46  96.29   Sodium 135 - 145 mmol/L 126  126  125   Potassium 3.5 - 5.1 mmol/L 4.2  4.5  3.9   Chloride 98 - 111 mmol/L 84  79  75   CO2 22 - 32 mmol/L 23  19  25  Calcium 8.9 - 10.3 mg/dL 6.5  6.3  5.5   Total Protein 6.5 - 8.1 g/dL  5.1    Total Bilirubin 0.0 - 1.2 mg/dL  1.2    Alkaline Phos 38 - 126 U/L  45    AST 15 - 41 U/L  501    ALT 0 - 44 U/L  301     Phosphorous: 11.4-->10 CK pending  EKG 4/15: peaked T waves  Assessment and plan Hospital day 3  Riddik Birkhead is a 43 y.o. Mr. Savalas Monje is a 43 year old male with pertinent past medical history of HIV, thoracic outlet syndrome, IV drug use (methamphetamine), most recent use 3 days ago who is presented to the ED for LE swelling and pain with oliguria 2/2 to rhabdomyolysis complicated by acute renal failure with high anion gap metabolic acidosis and electrolyte abnormalities.   Principal Problem: Acute renal failure 2/2 rhabdomyolysis Anion gap metabolic acidosis Improving. After HD yesterday he is down 5L and now he reports he is able to make urine. He may be reaching the point of autodiuresis but will continue to monitor urine output. BUN and Cr improving, ratio is  improving. We recommended a decrease in his bicarbonate infusion, in light of his normal bicarbonate today. Changed from 43ml/h to 50ml/h infusion bicarb. CK being trended. No plans for HD at this time thought he will likely need it in the future. The swelling in his legs are stable, but there is concern for long term nerve damage with his prolonged foot drop and burning pain in his feet persisting. Low threshold for getting CT to assess for potential compartment syndrome if his condition worsens. Nephrology following. - HD tomorrow per nephrology - Renal function panel tomorrow am - Strict I/Os - Bicarb drip decreased from 75 to 50ml/h per nephrology - Appreciate nephrology recommendations  - Dilaudid PRN - Scheduled Tylenol for pain   Hyperkalemia Improving. Peaked T waves on EKG yesterday, though his K+ is now wnl. I do not think we need to continue to trend magnesium at this time, it has been stable wnl since admission. - EKG to monitor    Hyperphosphatemia Hypocalemia Hypocalcemia persists at 6.5. Hyperphosphatemia improving. These abnormalities were likely due to recent cell lysis, causing phosphorous to increase, seeing some improvement due to recent HD. Continuing to monitor, no signs of paresthesias at this time. - Renal function panel  Hyponatremia Na stable. At this time we do not think this will be corrected through dialysis but do not want to quickly overcorrect it. - Careful attention to avoid overcorrection and osmotic demyelination   Acute bilateral LE deep vein thromboses  Stable. IV Heparin changed to Apixaban to decreased IV administrated medications. Pleuritic chest pain persist but he is saturating well on RA and no tachycardia to suggest PE. - Apixaban 10 mg BID  Elevated liver enzymes 4/15: AST 500, ALT 300, improved from admission, pending new hepatic function panel. Still no symptoms of hepatic encephalopathy. CT imaging did not show evidence of acute liver  injury. - Hepatitis panel wnl, though hepatitis surface antigen is pending.   Leukocytosis  Will order new CBC though is will likely be elevated in the setting of acute inflammation. No infectious signs at this time. - CBC with diff  HIV , CD4 >=800 Chronic. 02/25 labs show undetectable RNA and CD4 count >800. On Biktarvy and messaged pharmacy about potentially restarting it now that he is making urine. -Restart Biktarvy when able.   Bipolar 2  disorder Chronic. Pt on long-acting injectable aripiprazole (Aristada) for over 3 years. No signs of mania. - Holding home Aristada injection.   Methamphetamine use, Injection of illicit drug within last 12 months - TOC follow up when nearing discahage   Thoracic Outlet Syndrome - Chronic.  No paresthesias at this time. - Holding home gabapentin and duloxetine given ARF. Will restart when able.  VTE treatment: Apixaban  Diet: Normal diet PT/OT rec: SNF placement, they have been unable to progress ambulation due to fatigue and pain in LE IVF: sodium bicarbonate 150 mEq in sterile water 1,150 mL infusion  Code: Full    This is a Psychologist, occupational Note.  The care of the patient was discussed with Dr. Esaw Heckler and the assessment and plan was formulated with their assistance.  Please see their note for official documentation of the patient encounter.   Signed: Jere Monaco, Medical Student 11/25/2023, 10:27 AM    I have seen and examined the patient myself, and I have reviewed the note by Alfornia Imam, MS3 and was present during the interview and physical exam.   I spoke with Dr Irene Mannheim today, who recommended decreasing patient's bicarb drip from 75 to 50mL. He will likely receive another session of HD tomorrow, though I am encouraged that the patient is now making urine. Patient appears to be improving overall.   Signed: Jayson Michael, MD Internal Medicine Resident, PGY-1

## 2023-11-27 NOTE — TOC Progression Note (Addendum)
 Transition of Care [facility]) - Progression Note    Patient Details  Name: Kenneth Hunt MRN: [id number] Date of Birth: 04/20/1981  Transition of [facility] CM/SW Contact  Carmon Christen, LCSWA Phone Number: 11/27/2023, 3:43 PM  Clinical Narrative:     Patients passr pending. CSW awaiting 30 day note and FL2 to be cosigned by MD then can submit clinicals over to [place] must for review.CSW following to fax patient out for SNF closer to patient being medically ready. CSW will continue to follow.  Expected Discharge Plan: Skilled Nursing Facility Barriers to Discharge: Continued Medical Work up  Expected Discharge Plan and Services In-house Referral: Clinical Social Work     Living arrangements for the past 2 months: Single Family Home                                       Social Determinants of Health (SDOH) Interventions SDOH Screenings   Food Insecurity: No Food Insecurity (11/25/2023)  Housing: Low Risk  (11/25/2023)  Transportation Needs: No Transportation Needs (11/25/2023)  Utilities: Not At Risk (11/25/2023)  Alcohol Screen: Low Risk  (10/09/2022)  Depression (PHQ2-9): Low Risk  (09/26/2023)  Financial Resource Strain: Low Risk  (10/09/2022)  Physical Activity: Inactive (10/09/2022)  Social Connections: Not on File (05/04/2023)   Received from [facility]  Stress: Stress Concern Present (10/09/2022)  Tobacco Use: Low Risk  (11/27/2023)    Readmission Risk Interventions     No data to display

## 2023-11-27 NOTE — Progress Notes (Signed)
 ANTICOAGULATION CONSULT NOTE  Pharmacy Consult for Heparin Indication: DVT  Allergies  Allergen Reactions   Haloperidol Other (See Comments)    Tardive dyskinesia    Patient Measurements: Height: 5\' 3"  (160 cm) Weight: 74.7 kg (164 lb 10.9 oz) IBW/kg (Calculated) : 56.9 Heparin Dosing Weight: 73 kg  Vital Signs: Temp: 97.5 F (36.4 C) (04/16 0510) Temp Source: Oral (04/16 0510) BP: 118/73 (04/16 0510) Pulse Rate: 97 (04/16 0510)  Labs: Recent Labs    11/24/23 1300 11/24/23 1333 11/24/23 1740 11/25/23 0325 11/25/23 0550 11/25/23 0736 11/25/23 1318 11/25/23 2357 11/26/23 0524 11/26/23 1900 11/27/23 0509  HGB 15.2 15.0  --  12.9*  --   --   --   --  11.5*  --   --   HCT 42.2 44.0  --  35.3*  --   --   --   --  30.7*  --   --   PLT 271  --   --  216  --   --   --   --  209  --   --   HEPARINUNFRC  --   --   --   --    < >  --    < >  --  0.14* 0.44 0.22*  CREATININE 10.81* 11.70*   < > 11.45*  --  11.58*   < > 11.37* 12.49*  --  10.30*  CKTOTAL >50,000*  --   --   --   --  >50,000*  --   --  37,060*  --   --    < > = values in this interval not displayed.    Estimated Creatinine Clearance: 8.3 mL/min (A) (by C-G formula based on SCr of 10.3 mg/dL (H)).   Assessment: 64 yom with a history of bipolar disorder, HIV, previous thoracic outlet syndrome, anxiety, depression, IVDU (methamphetamines). Patient is presenting with several days of abdominal pain, bilateral leg pain right worse than left and bilateral leg swelling right worse than left. VAS US  significant for extensive bilateral DVTs. Heparin per pharmacy consult placed for DVT.  -heparin level= 0.33==22 on 1900 units/hr, Hg 11.5   Goal of Therapy:  Heparin level 0.3-0.7 units/ml Monitor platelets by anticoagulation protocol: Yes   Plan:  -Heparin bolus 1000 units x1 then increase to 2050 units/hr -heparin level in 8 hrs  Baxter Limber, PharmD Clinical Pharmacist **Pharmacist phone directory can now be  found on [email or web address] ([email or web address]).  Listed under [facility].

## 2023-11-27 NOTE — Progress Notes (Addendum)
 RE: Kenneth Hunt  Date of Birth: 25-Jun-1981  Date: 11/27/2023    To Whom It May Concern:   Please be advised that the above-named patient will require a short-term nursing home stay - anticipated 30 days or less for rehabilitation and strengthening. The plan is for return home.

## 2023-11-28 DIAGNOSIS — M6282 Rhabdomyolysis: Secondary | ICD-10-CM | POA: Diagnosis not present

## 2023-11-28 DIAGNOSIS — K5900 Constipation, unspecified: Secondary | ICD-10-CM | POA: Diagnosis present

## 2023-11-28 DIAGNOSIS — N179 Acute kidney failure, unspecified: Secondary | ICD-10-CM | POA: Diagnosis not present

## 2023-11-28 DIAGNOSIS — G5731 Lesion of lateral popliteal nerve, right lower limb: Secondary | ICD-10-CM | POA: Diagnosis present

## 2023-11-28 DIAGNOSIS — E8721 Acute metabolic acidosis: Secondary | ICD-10-CM | POA: Diagnosis not present

## 2023-11-28 DIAGNOSIS — E875 Hyperkalemia: Secondary | ICD-10-CM | POA: Diagnosis not present

## 2023-11-28 LAB — RENAL FUNCTION PANEL
Albumin: 2 g/dL — ABNORMAL LOW (ref 3.5–5.0)
Anion gap: 24 — ABNORMAL HIGH (ref 5–15)
BUN: 130 mg/dL — ABNORMAL HIGH (ref 6–20)
CO2: 26 mmol/L (ref 22–32)
Calcium: 6.9 mg/dL — ABNORMAL LOW (ref 8.9–10.3)
Chloride: 80 mmol/L — ABNORMAL LOW (ref 98–111)
Creatinine, Ser: 11.18 mg/dL — ABNORMAL HIGH (ref 0.61–1.24)
GFR, Estimated: 5 mL/min — ABNORMAL LOW (ref >60)
Glucose, Bld: 108 mg/dL — ABNORMAL HIGH (ref 70–99)
Phosphorus: 11.8 mg/dL — ABNORMAL HIGH (ref 2.5–4.6)
Potassium: 4.2 mmol/L (ref 3.5–5.1)
Sodium: 130 mmol/L — ABNORMAL LOW (ref 135–145)

## 2023-11-28 LAB — CBC WITH DIFFERENTIAL/PLATELET
Abs Immature Granulocytes: 0.16 10*3/uL — ABNORMAL HIGH (ref 0.00–0.07)
Basophils Absolute: 0 10*3/uL (ref 0.0–0.1)
Basophils Relative: 0 %
Eosinophils Absolute: 0.2 10*3/uL (ref 0.0–0.5)
Eosinophils Relative: 2 %
HCT: 32 % — ABNORMAL LOW (ref 39.0–52.0)
Hemoglobin: 12 g/dL — ABNORMAL LOW (ref 13.0–17.0)
Immature Granulocytes: 1 %
Lymphocytes Relative: 6 %
Lymphs Abs: 0.8 10*3/uL (ref 0.7–4.0)
MCH: 31.5 pg (ref 26.0–34.0)
MCHC: 37.5 g/dL — ABNORMAL HIGH (ref 30.0–36.0)
MCV: 84 fL (ref 80.0–100.0)
Monocytes Absolute: 1.1 10*3/uL — ABNORMAL HIGH (ref 0.1–1.0)
Monocytes Relative: 8 %
Neutro Abs: 12.3 10*3/uL — ABNORMAL HIGH (ref 1.7–7.7)
Neutrophils Relative %: 83 %
Platelets: 253 10*3/uL (ref 150–400)
RBC: 3.81 MIL/uL — ABNORMAL LOW (ref 4.22–5.81)
RDW: 12.6 % (ref 11.5–15.5)
WBC: 14.6 10*3/uL — ABNORMAL HIGH (ref 4.0–10.5)
nRBC: 0 % (ref 0.0–0.2)

## 2023-11-28 LAB — MISC LABCORP TEST (SEND OUT): Labcorp test code: 6510

## 2023-11-28 LAB — HEPATITIS B SURFACE ANTIBODY, QUANTITATIVE: Hep B S AB Quant (Post): 11383 m[IU]/mL

## 2023-11-28 MED ORDER — POLYETHYLENE GLYCOL 3350 17 G PO PACK
17.0000 g | PACK | Freq: Every day | ORAL | Status: DC
  Administered 2023-11-28 – 2023-12-05 (×8): 17 g via ORAL
  Filled 2023-11-28 (×11): qty 1

## 2023-11-28 MED ORDER — HEPARIN SODIUM (PORCINE) 1000 UNIT/ML IJ SOLN
INTRAMUSCULAR | Status: AC
  Filled 2023-11-28: qty 3

## 2023-11-28 NOTE — Plan of Care (Signed)
   Problem: Nutrition: Goal: Adequate nutrition will be maintained Outcome: Progressing

## 2023-11-28 NOTE — Progress Notes (Signed)
   11/28/23 1736  Vitals  Temp 98.1 F (36.7 C)  Pulse Rate 91  Resp 16  BP 111/71  SpO2 90 %  O2 Device Nasal Cannula  Weight 99.4 kg  Type of Weight Post-Dialysis  Oxygen Therapy  O2 Flow Rate (L/min) 2 L/min  Patient Activity (if Appropriate) In bed  Pulse Oximetry Type Continuous  Oximetry Probe Site Changed No  Post Treatment  Dialyzer Clearance Lightly streaked  Hemodialysis Intake (mL) 0 mL  Liters Processed 37.5  Fluid Removed (mL) 2000 mL  Tolerated HD Treatment Yes   Received patient in bed to unit.  Alert and oriented.  Informed consent signed and in chart.   TX duration: Two hours and thirty minutes  Patient tolerated well.  Transported back to the room  Alert, without acute distress.  Hand-off given to patient's nurse.   Access used: Right temporary internal jugular HD catheter Access issues: none

## 2023-11-28 NOTE — Progress Notes (Signed)
 PT Cancellation Note  Patient Details Name: Kenneth Hunt MRN: [id number] DOB: [birth year]   Cancelled Treatment:    Reason Eval/Treat Not Completed: Patient at procedure or test/unavailable (Pt off floor for HD. Will follow-up as schedule permits.)  Glenford Lanes, PT, DPT Acute Rehabilitation Services Office: [phone] Secure Chat Preferred  Riva Chester 11/28/2023, 2:46 PM

## 2023-11-28 NOTE — Plan of Care (Signed)

## 2023-11-28 NOTE — Progress Notes (Signed)
 Secure chat sent to Buddie Carina RN. Patient still off the unit, at dialysis. Notified RN to re-consult VAST when patient is back in the room and ready for VAST. Orrie Blake VAST

## 2023-11-28 NOTE — TOC Progression Note (Addendum)
 Transition of Care [facility]) - Progression Note    Patient Details  Name: Kenneth Hunt MRN: [id number] Date of Birth: Feb 23, 1981  [facility] CM/SW Contact  Carmon Christen, LCSWA Phone Number: 11/28/2023, 11:25 AM  Clinical Narrative:     Patients passr pending.CSW submitted clinicals to [place] must for review. CSW faxed patient out for SNF.CSW will continue to follow and assist with patients dc planning needs.  Expected Discharge Plan: Skilled Nursing Facility Barriers to Discharge: Continued Medical Work up  Expected Discharge Plan and Services In-house Referral: Clinical Social Work     Living arrangements for the past 2 months: Single Family Home                                       Social Determinants of Health (SDOH) Interventions SDOH Screenings   Food Insecurity: No Food Insecurity (11/25/2023)  Housing: Low Risk  (11/25/2023)  Transportation Needs: No Transportation Needs (11/25/2023)  Utilities: Not At Risk (11/25/2023)  Alcohol Screen: Low Risk  (10/09/2022)  Depression (PHQ2-9): Low Risk  (09/26/2023)  Financial Resource Strain: Low Risk  (10/09/2022)  Physical Activity: Inactive (10/09/2022)  Social Connections: Not on File (05/04/2023)   Received from [facility]  Stress: Stress Concern Present (10/09/2022)  Tobacco Use: Low Risk  (11/27/2023)    Readmission Risk Interventions     No data to display

## 2023-11-28 NOTE — Progress Notes (Signed)
 Patient ID: Kenneth Hunt, male   DOB: 13-Jul-1981, 43 y.o.   MRN: [id number] S: Still feels uncomfortable today. O:BP 121/80 (BP Location: Left Arm)   Pulse 79   Temp 98.1 F (36.7 C) (Axillary)   Resp 13   Ht 5\' 3"  (1.6 m)   Wt 74.7 kg   SpO2 95%   BMI 29.17 kg/m   Intake/Output Summary (Last 24 hours) at 11/28/2023 1158 Last data filed at 11/28/2023 0615 Gross per 24 hour  Intake --  Output 1700 ml  Net -1700 ml   Intake/Output: I/O last 3 completed shifts: In: 360 [P.O.:360] Out: 4800 [Urine:2400; Other:2400]  Intake/Output this shift:  No intake/output data recorded. Weight change:  Gen: NAD CVS: RRR Resp:CTA Abd: +BS, soft, NT/ND Ext: 3+ tense edema bilateral lower ext  Recent Labs  Lab 11/24/23 1300 11/24/23 1333 11/24/23 1740 11/24/23 1952 11/25/23 0325 11/25/23 0736 11/25/23 1318 11/25/23 1723 11/25/23 1952 11/25/23 2357 11/26/23 0524 11/27/23 0509 11/28/23 0424  NA 123*   < > 125*   < > 127*   < > 126* 124* 125* 125* 126* 126* 130*  K 6.5*   < > 5.6*   < > 4.8   < > 4.6 4.4 4.1 3.9 4.5 4.2 4.2  CL 87*   < > 91*   < > 86*   < > 83* 81* 81* 75* 79* 84* 80*  CO2 12*  --  11*   < > 16*   < > 19* 19* 21* 25 19* 23 26  GLUCOSE 94   < > 91   < > 96   < > 110* 108* 110* 108* 106* 110* 108*  BUN 127*   < > 128*   < > 132*   < > 141* 143* 144* 138* 150* 109* 130*  CREATININE 10.81*   < > 10.95*   < > 11.45*   < > 11.69* 12.10* 12.10* 11.37* 12.49* 10.30* 11.18*  ALBUMIN 3.1*  --  2.5*  --   --   --   --   --   --   --  2.2*  2.2* 2.1* 2.0*  CALCIUM 6.4*  --  6.2*   < > 6.2*   < > 6.0* 6.0* 5.8* 5.5* 6.3* 6.5* 6.9*  PHOS  --   --  9.2*  --  10.2*  --   --   --   --   --  11.4* 10.0* 11.8*  AST 1,578*  --   --   --   --   --   --   --   --   --  501*  --   --   ALT 688*  --   --   --   --   --   --   --   --   --  301*  --   --    < > = values in this interval not displayed.   Liver Function Tests: Recent Labs  Lab 11/24/23 1300 11/24/23 1740  11/26/23 0524 11/27/23 0509 11/28/23 0424  AST 1,578*  --  501*  --   --   ALT 688*  --  301*  --   --   ALKPHOS 63  --  45  --   --   BILITOT 1.0  --  1.2  --   --   PROT 6.9  --  5.1*  --   --   ALBUMIN 3.1*   < > 2.2*  2.2* 2.1* 2.0*   < > = values in this interval not displayed.   Recent Labs  Lab 11/24/23 1516  LIPASE 141*   No results for input(s): "AMMONIA" in the last 168 hours. CBC: Recent Labs  Lab 11/24/23 1300 11/24/23 1333 11/25/23 0325 11/26/23 0524 11/28/23 0424  WBC 18.9*  --  13.4* 13.6* 14.6*  NEUTROABS 15.7*  --   --   --  12.3*  HGB 15.2   < > 12.9* 11.5* 12.0*  HCT 42.2   < > 35.3* 30.7* 32.0*  MCV 84.7  --  84.0 81.6 84.0  PLT 271  --  216 209 253   < > = values in this interval not displayed.   Cardiac Enzymes: Recent Labs  Lab 11/24/23 1300 11/25/23 0736 11/26/23 0524 11/27/23 0508  CKTOTAL >50,000* >50,000* 37,060* 22,353*   CBG: No results for input(s): "GLUCAP" in the last 168 hours.  Iron Studies: No results for input(s): "IRON", "TIBC", "TRANSFERRIN", "FERRITIN" in the last 72 hours. Studies/Results: IR Fluoro Guide CV Line Right Result Date: 11/26/2023 INDICATION: Acute renal failure secondary to rhabdomyolysis EXAM: ULTRASOUND FLUOROSCOPIC RIGHT IJ TEMPORARY DIALYSIS CATHETER (20 CM MAHURKAR CATHETER) MEDICATIONS: 1% LIDOCAINE LOCAL ANESTHESIA/SEDATION: None. FLUOROSCOPY: Radiation Exposure Index (as provided by the fluoroscopic device): 1.0 mGy Kerma COMPLICATIONS: None immediate. PROCEDURE: Informed written consent was obtained from the patient after a thorough discussion of the procedural risks, benefits and alternatives. All questions were addressed. Maximal Sterile Barrier Technique was utilized including caps, mask, sterile gowns, sterile gloves, sterile drape, hand hygiene and skin antiseptic. A timeout was performed prior to the initiation of the procedure. Under sterile conditions and local anesthesia, ultrasound micropuncture  access performed the right internal jugular vein. Images obtained for documentation of the patent right internal jugular vein. 018 guidewire inserted followed by the 4 [place] transitional dilator. Amplatz guidewire advanced into the IVC. Measurements obtained for the appropriate length. Tract dilatation performed to insert a 20 cm temporary Mahurkar dialysis catheter. Tip positioned in the proximal right atrium. Blood aspirated easily followed by saline and heparin flushes. External caps applied to all lumens. Catheter secured with prolene sutures and a sterile dressing. No immediate complication. Patient tolerated the procedure well. Access ready for use. IMPRESSION: Successful ultrasound fluoroscopic right IJ temporary dialysis catheter placement. Electronically Signed   By: Judie Petit.  Shick M.D.   On: 11/26/2023 14:06   IR US Guide Vasc Access Right Result Date: 11/26/2023 INDICATION: Acute renal failure secondary to rhabdomyolysis EXAM: ULTRASOUND FLUOROSCOPIC RIGHT IJ TEMPORARY DIALYSIS CATHETER (20 CM MAHURKAR CATHETER) MEDICATIONS: 1% LIDOCAINE LOCAL ANESTHESIA/SEDATION: None. FLUOROSCOPY: Radiation Exposure Index (as provided by the fluoroscopic device): 1.0 mGy Kerma COMPLICATIONS: None immediate. PROCEDURE: Informed written consent was obtained from the patient after a thorough discussion of the procedural risks, benefits and alternatives. All questions were addressed. Maximal Sterile Barrier Technique was utilized including caps, mask, sterile gowns, sterile gloves, sterile drape, hand hygiene and skin antiseptic. A timeout was performed prior to the initiation of the procedure. Under sterile conditions and local anesthesia, ultrasound micropuncture access performed the right internal jugular vein. Images obtained for documentation of the patent right internal jugular vein. 018 guidewire inserted followed by the 4 [place] transitional dilator. Amplatz guidewire advanced into the IVC. Measurements obtained for  the appropriate length. Tract dilatation performed to insert a 20 cm temporary Mahurkar dialysis catheter. Tip positioned in the proximal right atrium. Blood aspirated easily followed by saline and heparin flushes. External caps applied to all lumens. Catheter secured  with prolene sutures and a sterile dressing. No immediate complication. Patient tolerated the procedure well. Access ready for use. IMPRESSION: Successful ultrasound fluoroscopic right IJ temporary dialysis catheter placement. Electronically Signed   By: Melven Stable.  Shick M.D.   On: 11/26/2023 14:06    acetaminophen  650 mg Oral Q8H   apixaban  10 mg Oral BID   Followed by   Cecily Cohen ON 12/04/2023] apixaban  5 mg Oral BID   Chlorhexidine Gluconate Cloth  6 each Topical Q0600   polyethylene glycol  17 g Oral Daily   sevelamer carbonate  1,600 mg Oral TID WC   sodium zirconium cyclosilicate  10 g Oral Daily    BMET    Component Value Date/Time   NA 130 (L) 11/28/2023 0424   K 4.2 11/28/2023 0424   CL 80 (L) 11/28/2023 0424   CO2 26 11/28/2023 0424   GLUCOSE 108 (H) 11/28/2023 0424   BUN 130 (H) 11/28/2023 0424   CREATININE 11.18 (H) 11/28/2023 0424   CREATININE 1.29 09/26/2023 1455   CALCIUM 6.9 (L) 11/28/2023 0424   GFRNONAA 5 (L) 11/28/2023 0424   GFRNONAA 92 07/23/2019 1426   GFRAA 58 (L) 03/05/2020 0345   GFRAA 106 07/23/2019 1426   CBC    Component Value Date/Time   WBC 14.6 (H) 11/28/2023 0424   RBC 3.81 (L) 11/28/2023 0424   HGB 12.0 (L) 11/28/2023 0424   HGB 16.1 08/20/2022 1424   HCT 32.0 (L) 11/28/2023 0424   HCT 47.8 08/20/2022 1424   PLT 253 11/28/2023 0424   PLT 313 08/20/2022 1424   MCV 84.0 11/28/2023 0424   MCV 93 08/20/2022 1424   MCH 31.5 11/28/2023 0424   MCHC 37.5 (H) 11/28/2023 0424   RDW 12.6 11/28/2023 0424   RDW 11.9 08/20/2022 1424   LYMPHSABS 0.8 11/28/2023 0424   LYMPHSABS 1.9 08/20/2022 1424   MONOABS 1.1 (H) 11/28/2023 0424   EOSABS 0.2 11/28/2023 0424   EOSABS 0.0 08/20/2022 1424    BASOSABS 0.0 11/28/2023 0424   BASOSABS 0.0 08/20/2022 1424     Assessment/Plan:   AKI- in the setting of severe rhabdomyolysis.  He was started on IVF's with some UOP but remains oliguric despite IV lasix.  He underwent RIJ temp HD catheter by IR followed by first HD session 11/26/23 due to lack of response to IVF's.  Able to UF 2 liters with HD and his UOP picked up to over a liter.  Unfortunately his BUN/Cr worsened and still has significant edema.  Will plan for another HD session today and follow for ongoing recovery.  Hopefully this will not be permanent.  Continue to follow daily UOP and BUN/Cr. Rhabdomyolysis - presumably due to acute meth intoxication.  C/o leg pain.  Continue to follow CK levels Acute bilateral DVT's - per primary svc Abnormal LFT's - due to meth and rhabdo.  Continue to follow. 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  AGMA - due to #1, on isotonic bicarb.     Benjamin Brands, MD [facility]

## 2023-11-28 NOTE — Progress Notes (Addendum)
 Interval history He continues to make good urine. No changes in his leg pain or paresthesias or breathing. He reports having a bowel movement yesterday and continues to feel bloated. He continues to complain of difficulty dorsiflexing his R foot.  Physical exam Blood pressure 134/78, pulse 90, temperature 98.1 F (36.7 C), temperature source Oral, resp. rate 20, height 5\' 3"  (1.6 m), weight 74.7 kg, SpO2 96%.  General: He is laying comfortably in the bed upon entering the room, his legs were not elevated today, it allows him to move around more. Cardiovascular: regular rate and rhythm, no m/r/g Pulmonary/Chest: He was able to take a deeper breath than yesterday, mild inspiratory wheezing noted  Extremities:  - Shape of the R leg has begun to reform, edema looks to have gone down - Bullae on the dorsal aspect of the R leg are stable, no evidence of rupture.  - Erythema on anterior aspect of R leg  - He seems less tender to palpation of RLE today - L leg swelling remains stable - He is able to feel sensation to Light touch in L>R LE.  - His dorsalis pedis pulses remain palpable b/l.  MSK: 0/5 on dorsiflexion of R ankle, can wiggle his toes.  Neurological: Alert and oriented Psych: Good mood  Weight change:    Intake/Output Summary (Last 24 hours) at 11/28/2023 0649 Last data filed at 11/28/2023 0615 Gross per 24 hour  Intake --  Output 1700 ml  Net -1700 ml   Net IO Since Admission: -7,181.43 mL [11/28/23 0649]  Labs, images, and other studies          Component Ref Range & Units (hover) 04:24 (11/28/23) 2 d ago (11/26/23) 3 d ago (11/25/23) 4 d ago (11/24/23) 4 d ago (11/24/23) 11 mo ago (12/08/22) 1 yr ago (10/07/22)  WBC 14.6 High  13.6 High  13.4 High   18.9 High  10.6 High  8.9  RBC 3.81 Low  3.76 Low  4.20 Low   4.98 4.75 4.74  Hemoglobin 12.0 Low  11.5 Low  12.9 Low  15.0 15.2 14.4 14.7  HCT 32.0 Low  30.7 Low  35.3 Low  44.0 42.2 41.9 42.4  MCV 84.0  81.6 84.0  84.7 88.2 89.5  MCH 31.5 30.6 30.7  30.5 30.3 31.0  MCHC 37.5 High  37.5 High  36.5 High   36.0 34.4 34.7  RDW 12.6 12.5 12.7  12.8 12.9 12.4  Platelets 253 209 216  271 294 321  nRBC 0.0 0.0 CM 0.0 CM  0.0 0.0 0.0  Neutrophils Relative % 83    83 64 53  Neutro Abs 12.3 High     15.7 High  6.9 4.7  Lymphocytes Relative 6    9 27  37  Lymphs Abs 0.8    1.7 2.9 3.3  Monocytes Relative 8    7 6 8   Monocytes Absolute 1.1 High     1.4 High  0.7 0.7  Eosinophils Relative 2    0 1 1  Eosinophils Absolute 0.2    0.0 0.1 0.1  Basophils Relative 0    0 1 1  Basophils Absolute 0.0    0.0 0.1 0.1  Immature Granulocytes 1    1 1  0  Abs Immature Granulocytes 0.16 High     0.15 High  CM 0.05 CM 0.04 CM  Latest Ref Rng & Units 11/28/2023    4:24 AM 11/27/2023    5:09 AM 11/26/2023    5:24 AM  CMP  Glucose 70 - 99 mg/dL 409  811  914   BUN 6 - 20 mg/dL 782  956  213   Creatinine 0.61 - 1.24 mg/dL 08.65  78.46  96.29   Sodium 135 - 145 mmol/L 130  126  126   Potassium 3.5 - 5.1 mmol/L 4.2  4.2  4.5   Chloride 98 - 111 mmol/L 80  84  79   CO2 22 - 32 mmol/L 26  23  19    Calcium 8.9 - 10.3 mg/dL 6.9  6.5  6.3   Total Protein 6.5 - 8.1 g/dL   5.1   Total Bilirubin 0.0 - 1.2 mg/dL   1.2   Alkaline Phos 38 - 126 U/L   45   AST 15 - 41 U/L   501   ALT 0 - 44 U/L   301     CK 37k-->22k  EKG 4/15: peaked T waves  Assessment and plan Hospital day 4  Kenneth Hunt is a 43 y.o.  with pertinent past medical history of HIV, thoracic outlet syndrome, IV drug use (methamphetamine),bipolar 2 disorder, who presented to the ED for LE swelling and pain with oliguria 2/2 to rhabdomyolysis complicated by acute renal failure with high anion gap metabolic acidosis and electrolyte abnormalities.   Acute renal failure 2/2 rhabdomyolysis Improving. He is making good urine on his own now congruent with his decreased R leg edema. His breathing seems deeper. His CK continues to trend  down, leukocytosis is stable, electrolytes are stable except for some mild improvement in hyponatremia. BUN/Cr ratio mildly increased.  He will continue to get hemodialysis this afternoon.  BUN and Cr improving, ratio is improving. Bicarb continues to be stable with 43ml/h infusion. Foot drop is stable. Low threshold for getting CT to assess for potential compartment syndrome if his condition worsens. Nephrology following. - HD today per nephrology - RFP tomorrow am - Strict I/Os  Acute bilateral LE deep vein thromboses  Pain and swelling continue to slowly improve. Heparin ggt transitioned to Eliquis 10mg  BID for 7 days, then 5mg  BID for minimally 6 months per pharmacy - Eliquis 10mg  BID - Scheduled Tylenol 650mg  q8h, oral dilaudid 1mg  q4 prn, IV dilaudid 0.5mg  q2h prn - PT/OT following, recommending SNF at discharge  Anion gap metabolic acidosis Likely secondary to acute renal failure. Bicarb improving daily with now improving UOP and HD - Continue bicarb drip at 65mL/hr per nephrology  Hyperkalemia Hyperphosphatemia Hypocalcemia K 4.2, Ca 6.9, Phos 11.8. All electrolyte derangements likely 2/2 acute renal failure and dysregulation of volume status. Derangements will hopefully continue to improve with continued HD and return of patient's own renal function.  - Trend electrolytes with daily labs - Sevelamer carbonate 1600mg  TID - Most recent EKG with improvement in peaked T waves  Hyponatremia Na 130, improved today. Likely due to intravascular volume shifts in setting of ARF. Continue to monitor, expect to improve with improvement in kidney function.   Elevated liver enzymes Initially significantly elevated, now downtrending. Likely due to combined myotoxicity of methamphetamine and crush injury. GGT within normal limits indicating muscular rather than hepatic etiology to elevated AST/ALT   Leukocytosis  WBC 14.6 today, similar to previous. Likely elevated in the setting of acute  inflammation. Afebrile and no other evidence of ongoing infectious process. Continue to monitor  HIV with CD4 >=800  Labs from Feb 2025 show undetectable RNA and CD4 count >800.  - Holding Biktarvy due to ARF. Will restart when kidney function improves  Bipolar 2 disorder Pt on long-acting injectable aripiprazole ([place]) for over 3 years, he has not taken his weekly injection in 3 weeks. Will plan to restart home medication once renal function improves and if patient remains admitted.   Constipation He has had his first bowel movement today since admission after getting bisacodyl 5mg  yesterday. Will start miralax to continue getting his stools moving. - Miralax 17g daily - Bisacodyl 5mg  PRN  Thoracic outlet syndrome: Holding home gabapentin and duloxetine given ARF. Will restart when able.   VTE treatment: Eliquis Diet: Regular diet PT/OT rec: SNF placement, they have been unable to progress ambulation due to fatigue and pain in LE IVF: sodium bicarbonate 150 mEq in sterile water 1,150 mL infusion  Code: Full   This is a Psychologist, occupational Note.  The care of the patient was discussed with Dr. Esaw Heckler and the assessment and plan was formulated with their assistance.  Please see their note for official documentation of the patient encounter.   Signed: Jere Monaco, Medical Student 11/25/2023, 10:27 AM    I have seen and examined the patient myself, and I have reviewed the note by Alfornia Imam, MS3 and was present during the interview and physical exam.    Signed: Jayson Michael, MD Internal Medicine Resident, PGY-1

## 2023-11-29 DIAGNOSIS — M6282 Rhabdomyolysis: Secondary | ICD-10-CM | POA: Diagnosis not present

## 2023-11-29 DIAGNOSIS — N179 Acute kidney failure, unspecified: Secondary | ICD-10-CM | POA: Diagnosis not present

## 2023-11-29 LAB — RENAL FUNCTION PANEL
Albumin: 2.1 g/dL — ABNORMAL LOW (ref 3.5–5.0)
Anion gap: 20 — ABNORMAL HIGH (ref 5–15)
BUN: 101 mg/dL — ABNORMAL HIGH (ref 6–20)
CO2: 28 mmol/L (ref 22–32)
Calcium: 7.4 mg/dL — ABNORMAL LOW (ref 8.9–10.3)
Chloride: 83 mmol/L — ABNORMAL LOW (ref 98–111)
Creatinine, Ser: 10.13 mg/dL — ABNORMAL HIGH (ref 0.61–1.24)
GFR, Estimated: 6 mL/min — ABNORMAL LOW (ref >60)
Glucose, Bld: 111 mg/dL — ABNORMAL HIGH (ref 70–99)
Phosphorus: 9.8 mg/dL — ABNORMAL HIGH (ref 2.5–4.6)
Potassium: 4 mmol/L (ref 3.5–5.1)
Sodium: 131 mmol/L — ABNORMAL LOW (ref 135–145)

## 2023-11-29 LAB — CULTURE, BLOOD (ROUTINE X 2)
Culture: NO GROWTH
Culture: NO GROWTH

## 2023-11-29 LAB — MISC LABCORP TEST (SEND OUT): Labcorp test code: 6510

## 2023-11-29 LAB — HEPATITIS B SURFACE ANTIBODY, QUANTITATIVE: Hep B S AB Quant (Post): 13001 m[IU]/mL

## 2023-11-29 NOTE — Progress Notes (Signed)
 Patient ID: Kenneth Hunt, male   DOB: Nov 03, 1980, 43 y.o.   MRN: [id number] S: No new complaints this morning.  His sister was at the bedside. O:BP (!) 140/109 (BP Location: Left Arm)   Pulse 92   Temp 98.1 F (36.7 C) (Axillary)   Resp 16   Ht 5\' 3"  (1.6 m)   Wt 99.4 kg   SpO2 95%   BMI 38.82 kg/m   Intake/Output Summary (Last 24 hours) at 11/29/2023 1029 Last data filed at 11/29/2023 0415 Gross per 24 hour  Intake --  Output 3350 ml  Net -3350 ml   Intake/Output: I/O last 3 completed shifts: In: -  Out: 5050 [Urine:3050; Other:2000]  Intake/Output this shift:  No intake/output data recorded. Weight change:  Gen: NAD CVS: RRR Resp:CTA Abd: +BS, soft, mildly tender Ext: 3+ tense edema bilateral lower ext  Recent Labs  Lab 11/24/23 1300 11/24/23 1333 11/24/23 1740 11/24/23 1952 11/25/23 0325 11/25/23 0736 11/25/23 1723 11/25/23 1952 11/25/23 2357 11/26/23 0524 11/27/23 0509 11/28/23 0424 11/29/23 0425  NA 123*   < > 125*   < > 127*   < > 124* 125* 125* 126* 126* 130* 131*  K 6.5*   < > 5.6*   < > 4.8   < > 4.4 4.1 3.9 4.5 4.2 4.2 4.0  CL 87*   < > 91*   < > 86*   < > 81* 81* 75* 79* 84* 80* 83*  CO2 12*  --  11*   < > 16*   < > 19* 21* 25 19* 23 26 28   GLUCOSE 94   < > 91   < > 96   < > 108* 110* 108* 106* 110* 108* 111*  BUN 127*   < > 128*   < > 132*   < > 143* 144* 138* 150* 109* 130* 101*  CREATININE 10.81*   < > 10.95*   < > 11.45*   < > 12.10* 12.10* 11.37* 12.49* 10.30* 11.18* 10.13*  ALBUMIN  3.1*  --  2.5*  --   --   --   --   --   --  2.2*  2.2* 2.1* 2.0* 2.1*  CALCIUM  6.4*  --  6.2*   < > 6.2*   < > 6.0* 5.8* 5.5* 6.3* 6.5* 6.9* 7.4*  PHOS  --   --  9.2*  --  10.2*  --   --   --   --  11.4* 10.0* 11.8* 9.8*  AST 1,578*  --   --   --   --   --   --   --   --  501*  --   --   --   ALT 688*  --   --   --   --   --   --   --   --  301*  --   --   --    < > = values in this interval not displayed.   Liver Function Tests: Recent Labs  Lab  11/24/23 1300 11/24/23 1740 11/26/23 0524 11/27/23 0509 11/28/23 0424 11/29/23 0425  AST 1,578*  --  501*  --   --   --   ALT 688*  --  301*  --   --   --   ALKPHOS 63  --  45  --   --   --   BILITOT 1.0  --  1.2  --   --   --  PROT 6.9  --  5.1*  --   --   --   ALBUMIN  3.1*   < > 2.2*  2.2* 2.1* 2.0* 2.1*   < > = values in this interval not displayed.   Recent Labs  Lab 11/24/23 1516  LIPASE 141*   No results for input(s): "AMMONIA" in the last 168 hours. CBC: Recent Labs  Lab 11/24/23 1300 11/24/23 1333 11/25/23 0325 11/26/23 0524 11/28/23 0424  WBC 18.9*  --  13.4* 13.6* 14.6*  NEUTROABS 15.7*  --   --   --  12.3*  HGB 15.2   < > 12.9* 11.5* 12.0*  HCT 42.2   < > 35.3* 30.7* 32.0*  MCV 84.7  --  84.0 81.6 84.0  PLT 271  --  216 209 253   < > = values in this interval not displayed.   Cardiac Enzymes: Recent Labs  Lab 11/24/23 1300 11/25/23 0736 11/26/23 0524 11/27/23 0508  CKTOTAL >50,000* >50,000* 37,060* 22,353*   CBG: No results for input(s): "GLUCAP" in the last 168 hours.  Iron Studies: No results for input(s): "IRON", "TIBC", "TRANSFERRIN", "FERRITIN" in the last 72 hours. Studies/Results: No results found.  acetaminophen   650 mg Oral Q8H   apixaban   10 mg Oral BID   Followed by   Cecily Cohen ON 12/04/2023] apixaban   5 mg Oral BID   Chlorhexidine  Gluconate Cloth  6 each Topical Q0600   polyethylene glycol  17 g Oral Daily   sevelamer  carbonate  1,600 mg Oral TID WC   sodium zirconium cyclosilicate   10 g Oral Daily    BMET    Component Value Date/Time   NA 131 (L) 11/29/2023 0425   K 4.0 11/29/2023 0425   CL 83 (L) 11/29/2023 0425   CO2 28 11/29/2023 0425   GLUCOSE 111 (H) 11/29/2023 0425   BUN 101 (H) 11/29/2023 0425   CREATININE 10.13 (H) 11/29/2023 0425   CREATININE 1.29 09/26/2023 1455   CALCIUM  7.4 (L) 11/29/2023 0425   GFRNONAA 6 (L) 11/29/2023 0425   GFRNONAA 92 07/23/2019 1426   GFRAA 58 (L) 03/05/2020 0345   GFRAA 106  07/23/2019 1426   CBC    Component Value Date/Time   WBC 14.6 (H) 11/28/2023 0424   RBC 3.81 (L) 11/28/2023 0424   HGB 12.0 (L) 11/28/2023 0424   HGB 16.1 08/20/2022 1424   HCT 32.0 (L) 11/28/2023 0424   HCT 47.8 08/20/2022 1424   PLT 253 11/28/2023 0424   PLT 313 08/20/2022 1424   MCV 84.0 11/28/2023 0424   MCV 93 08/20/2022 1424   MCH 31.5 11/28/2023 0424   MCHC 37.5 (H) 11/28/2023 0424   RDW 12.6 11/28/2023 0424   RDW 11.9 08/20/2022 1424   LYMPHSABS 0.8 11/28/2023 0424   LYMPHSABS 1.9 08/20/2022 1424   MONOABS 1.1 (H) 11/28/2023 0424   EOSABS 0.2 11/28/2023 0424   EOSABS 0.0 08/20/2022 1424   BASOSABS 0.0 11/28/2023 0424   BASOSABS 0.0 08/20/2022 1424    Assessment/Plan:   AKI- in the setting of severe rhabdomyolysis.  He was started on IVF's with some UOP but remains oliguric despite IV lasix .  He underwent RIJ temp HD catheter by IR followed by first HD session 11/26/23 due to lack of response to IVF's.  Able to UF 2 liters with HD and his UOP picked up to over a liter.  Unfortunately his BUN/Cr worsened and still has significant edema.  HD session #2 on 11/28/23.  Had UF of 2L with HD  and had 1.35 liters of UOP overnight.  Hopefully this will not be permanent.  Continue to follow daily UOP and BUN/Cr.  He may need another session of HD tomorrow if his BUN/Cr continue to rise. Rhabdomyolysis - presumably due to acute meth intoxication.  C/o leg pain.  Continue to follow CK levels Acute bilateral DVT's - per primary svc Abnormal LFT's - due to meth and rhabdo.  Continue to follow. 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  AGMA - due to #1, on isotonic bicarb.   Benjamin Brands, MD [facility] [phone]

## 2023-11-29 NOTE — Progress Notes (Signed)
                  Subjective:   Summary: 43 yo M with PMH significant for well-controlled HIV, bipolar 2, presenting with acute renal failure 2/2 rhabdomyolysis and acute bilateral DVTs in the setting of methamphetamine use and prolonged immobility.   Patient appears similar to yesterday with flattened affect and the sensation that his legs are tight. Discussed that we still cannot confirm how long he will need dialysis. All other questions answered  Objective:  Vital signs in last 24 hours: Vitals:   11/28/23 2026 11/29/23 0415 11/29/23 0747 11/29/23 1020  BP: 128/80 121/68 (!) 140/109 (!) 140/86  Pulse: 91 93 92 93  Resp: 20 13 16 19   Temp: 98.7 F (37.1 C) 98.1 F (36.7 C) 98.1 F (36.7 C) 97.6 F (36.4 C)  TempSrc: Oral Oral Axillary Axillary  SpO2: 92% 94% 95% 93%  Weight:      Height:       Supplemental O2: Nasal Cannula SpO2: 93 % O2 Flow Rate (L/min): 2 L/min  Physical Exam:  Constitutional: fatigued appearing. No significant distress Extremities: edematous, erythematous bilateral lower extremities, similar to exam yesterday. Palpable DP pulses Neuro: A&O x 3  Assessment/Plan:   Acute renal failure 2/2 rhabdomyolysis  Kidney function appears to be improving with increased urine output since discharge, though filtration has not yet started improving. Nephrology following, appreciate assistance. He has received 2 sessions of HD thus far, hopefully he will be able to tolerate higher flow rate at next session. Creatinine 10.1 today, similar to previous - HD tomorrow per nephrology - Lasix  120mg  q12h - Trend RFP, CK - Strict I/Os  Acute bilateral LE deep vein thromboses R foot drop, suspect peroneal nerve injury Legs are still significantly swollen, no evidence of compartment syndrome on exam. Patient is extremely concerned about his continued leg weakness, particularly with R leg dorsiflexion.  - Eliquis  10mg  BID, will continue DOAC for anticipated 6 month  course - Scheduled Tylenol  650mg  q8h, oral dilaudid  1mg  q4 prn, IV dilaudid  0.5mg  q2h prn  - PT following, recommend SNF at discharge  Anion gap metabolic acidosis Bicarb improving daily with now improving UOP as well as HD - Continue bicarb ggt at 61mL/hr per nephrology  Hyponatremia Hyperkalemia Hyperphosphatemia Hypocalcemia Electrolyte derangements slowly improving with return of kidney function and HD. No indication for repletion based on today's levels - Trend BMP - Sevelamer  carbonate 1600mg  TID  Elevated liver enzymes: downtrending, likely 2/2 rhabdo Leukocytosis: elevated likely in the setting of acute inflammation rather than infectious process HIV, well-controlled: holding home Biktarvy  due to ARF. Will resume once renal function improves Bipolar 2 disorder: takes long-acting injectable q2 months, last taken 1 month ago Constipation: Miralax  17g daily, Bisacodyl  5mg  prn Thoracic outlet syndrome: holding home gabapentin  and duloxetine . Will restart when renal function improves  Diet: Renal VTE: Eliquis  Code: Full  Dispo: Anticipated discharge to Skilled nursing facility pending medical stability.   Redell Burnet, MD PGY-1 Internal Medicine Resident Pager Number [phone] Please contact the on call pager after 5 pm and on weekends at [phone].

## 2023-11-29 NOTE — Progress Notes (Signed)
 Physical Therapy Treatment Patient Details Name: Kenneth Hunt MRN: [id number] DOB: [birth year] Today's Date: 11/29/2023   History of Present Illness Kenneth Hunt is a 43 year old male who is presenting with several days of lower extremity swelling/pain and oliguria.   PMH: bipolar disorder, HIV, anxiety, depression, thoracic outlet syndrome, IV drug use (methamphetamine), most recent use 3 days ago    PT Comments  Pt is sitting up in recliner on entry. Reports some frustration with not being able to walk over to recliner earlier. Started session with LE self stretch of bilateral calfs and quads using gait belt. Pt introduced to [facility] and is able to come to standing from low recliner surface with contact guard. In standing pt is able to work on weight shifting on to R LE, marching in place, and sit to stand from elevated Stedy pads. Used Stedy to move pt to bed. Pt able to return to supine without assist. Agreeable to laying in bed with LE elevated above his heart. Pt asleep before PT left room. D/c plan remains appropriate at this time. PT will continue to follow acutely.      If plan is discharge home, recommend the following: A lot of help with walking and/or transfers;Help with stairs or ramp for entrance;Assist for transportation;A lot of help with bathing/dressing/bathroom   Can travel by private vehicle     No  Equipment Recommendations   (TBD at next venue)       Precautions / Restrictions Precautions Precautions: Fall Precaution/Restrictions Comments: very edematous Restrictions Weight Bearing Restrictions Per Provider Order: No     Mobility  Bed Mobility Overal bed mobility: Needs Assistance Bed Mobility: Sit to Supine       Sit to supine: HOB elevated, Used rails, Supervision   General bed mobility comments: pt requiring increased time and effort, but able to manage LE back into bed without assist    Transfers Overall transfer level: Needs  assistance Equipment used: Ambulation equipment used Transfers: Sit to/from Stand, Bed to chair/wheelchair/BSC Sit to Stand: Contact guard assist           General transfer comment: pt CGA for power up from lower recliner surface, once standing in [place] pt able to perform >10 sit to stands from elevated Engineer, building services via Lift Equipment: Stedy  Ambulation/Gait               General Gait Details: deferred due to decreased ability to pick up R LE      Balance Overall balance assessment: Needs assistance Sitting-balance support: Feet supported, Bilateral upper extremity supported Sitting balance-Leahy Scale: Fair     Standing balance support: Bilateral upper extremity supported, During functional activity, Reliant on assistive device for balance Standing balance-Leahy Scale: Poor Standing balance comment: reliant on RW                            Communication Communication Communication: No apparent difficulties  Cognition Arousal: Alert Behavior During Therapy: Anxious, WFL for tasks assessed/performed                             Following commands: Intact      Cueing Cueing Techniques: Verbal cues  Exercises Other Exercises Other Exercises: self stretch with gait belt x 10 each side with 10 sec hold Other Exercises: marching in place with increased UE support on Stedy x 20 Other Exercises: sit to stand  from elevated Stedy pads x 15    General Comments General comments (skin integrity, edema, etc.): VSS, continues to have rubor and edema in R shin and calf but not as large a patch and not as red as earlier      Pertinent Vitals/Pain Pain Assessment Pain Assessment: 0-10 Pain Score: 7  (by end of session 5) Pain Location: bilat LEs Pain Descriptors / Indicators: Burning, Sharp Pain Intervention(s): Monitored during session, Premedicated before session, Repositioned, Limited activity within patient's tolerance     PT Goals  (current goals can now be found in the care plan section) Acute Rehab PT Goals Patient Stated Goal: get better PT Goal Formulation: With patient Time For Goal Achievement: 12/10/23 Potential to Achieve Goals: Good Progress towards PT goals: Progressing toward goals    Frequency    Min 2X/week       AM-PAC PT "6 Clicks" Mobility   Outcome Measure  Help needed turning from your back to your side while in a flat bed without using bedrails?: A Little Help needed moving from lying on your back to sitting on the side of a flat bed without using bedrails?: A Little Help needed moving to and from a bed to a chair (including a wheelchair)?: A Lot Help needed standing up from a chair using your arms (e.g., wheelchair or bedside chair)?: A Little Help needed to walk in hospital room?: Total Help needed climbing 3-5 steps with a railing? : Total 6 Click Score: 13    End of Session Equipment Utilized During Treatment: Oxygen Activity Tolerance: Patient limited by pain Patient left: in bed;with call bell/phone within reach (foot of bed elevated above his heart, with instructions on how to reposition bed if he gets uncomfortable) Nurse Communication: Mobility status PT Visit Diagnosis: Unsteadiness on feet (R26.81);Difficulty in walking, not elsewhere classified (R26.2);Pain Pain - Right/Left: Right (bilat LEs) Pain - part of body: Leg     Time: 1610-9604 PT Time Calculation (min) (ACUTE ONLY): 37 min  Charges:    $Therapeutic Exercise: 8-22 mins $Therapeutic Activity: 8-22 mins PT General Charges $$ ACUTE PT VISIT: 1 Visit                     Kenneth Hunt PT, DPT Acute Rehabilitation Services Please use secure chat or  Call Office [phone]    Kenneth Hunt [facility] 11/29/2023, 3:03 PM

## 2023-11-29 NOTE — Plan of Care (Signed)

## 2023-11-30 DIAGNOSIS — M6282 Rhabdomyolysis: Secondary | ICD-10-CM | POA: Diagnosis not present

## 2023-11-30 DIAGNOSIS — N179 Acute kidney failure, unspecified: Secondary | ICD-10-CM | POA: Diagnosis not present

## 2023-11-30 LAB — RENAL FUNCTION PANEL
Albumin: 2.1 g/dL — ABNORMAL LOW (ref 3.5–5.0)
Anion gap: 20 — ABNORMAL HIGH (ref 5–15)
BUN: 117 mg/dL — ABNORMAL HIGH (ref 6–20)
CO2: 29 mmol/L (ref 22–32)
Calcium: 7.2 mg/dL — ABNORMAL LOW (ref 8.9–10.3)
Chloride: 80 mmol/L — ABNORMAL LOW (ref 98–111)
Creatinine, Ser: 11.24 mg/dL — ABNORMAL HIGH (ref 0.61–1.24)
GFR, Estimated: 5 mL/min — ABNORMAL LOW (ref >60)
Glucose, Bld: 136 mg/dL — ABNORMAL HIGH (ref 70–99)
Phosphorus: 9.5 mg/dL — ABNORMAL HIGH (ref 2.5–4.6)
Potassium: 3.8 mmol/L (ref 3.5–5.1)
Sodium: 129 mmol/L — ABNORMAL LOW (ref 135–145)

## 2023-11-30 LAB — HEPATITIS B SURFACE ANTIGEN: Hepatitis B Surface Ag: NONREACTIVE

## 2023-11-30 LAB — CK: Total CK: 4819 U/L — ABNORMAL HIGH (ref 49–397)

## 2023-11-30 MED ORDER — HEPARIN SODIUM (PORCINE) 1000 UNIT/ML IJ SOLN
INTRAMUSCULAR | Status: AC
Start: 2023-11-30 — End: 2023-12-01
  Filled 2023-11-30: qty 4

## 2023-11-30 NOTE — Plan of Care (Signed)

## 2023-11-30 NOTE — Progress Notes (Signed)
                  Subjective:   Summary: 43 yo M with PMH significant for well-controlled HIV, bipolar 2, presenting with acute renal failure 2/2 rhabdomyolysis and acute bilateral DVTs in the setting of methamphetamine use and prolonged immobility.   No significant changes from yesterday.  Patient feels his breathing has been easier.  He denies any change in his leg edema or pain.  Continues to endorse significant foot drop, unchanged.  Objective:  Vital signs in last 24 hours: Vitals:   11/29/23 1020 11/29/23 1935 11/30/23 0329 11/30/23 0735  BP: (!) 140/86 (!) 144/84 138/85 130/73  Pulse: 93 86 85 82  Resp: 19 20 18 13   Temp: 97.6 F (36.4 C) 98 F (36.7 C) 98.2 F (36.8 C) 97.8 F (36.6 C)  TempSrc: Axillary Oral Oral Oral  SpO2: 93% 95% 93% 97%  Weight:      Height:       Supplemental O2: Nasal Cannula SpO2: 97 % O2 Flow Rate (L/min): 2 L/min  Physical Exam:  Constitutional: fatigued appearing. No significant distress Extremities: edematous, erythematous bilateral lower extremities, similar to exam yesterday. Palpable DP pulses Neuro: A&O x 3  Assessment/Plan:   43 yo with ARF 2/2 rhabdomyolysis, acute bilateral LE DVTs with severe edema and foot drop.  She continues to have good urine output, still not filtering well.  Unsure how long he will continue to require dialysis.  His primary concern is about his legs which have shown only minimal improvement since arrival.  Acute renal failure 2/2 rhabdomyolysis  He continues to respond well to diuretics, still not filtering well evidenced by stable BUN/Creatinine ratio. Will plan for third HD session today - HD today per nephrology - Lasix  120mg  q12h. Patient lost his IV last night, may delay doses - Trend RFP - Strict I/Os, fluid restriction to  Acute bilateral LE deep vein thromboses R foot drop, suspect peroneal nerve injury Legs are still significantly swollen, no evidence of compartment syndrome on exam.  Will consider compression stockings once his swelling decreases more - Eliquis  10mg  BID, will continue DOAC for anticipated 6 month course - Scheduled Tylenol  650mg  q8h, oral dilaudid  1mg  q4 prn, IV dilaudid  0.5mg  q2h prn  - PT following, recommend SNF at discharge  Anion gap metabolic acidosis Bicarb improving daily with now improving UOP as well as HD - Continue bicarb ggt at 90mL/hr per nephrology  Hyponatremia Hyperkalemia Hyperphosphatemia Hypocalcemia Electrolyte derangements slowly improving with return of kidney function and HD. No indication for repletion based on today's levels - Trend BMP - Sevelamer  carbonate 1600mg  TID  Elevated liver enzymes: downtrending, likely 2/2 rhabdo Leukocytosis: elevated likely in the setting of acute inflammation rather than infectious process HIV, well-controlled: holding home Biktarvy  due to ARF. Will resume once renal function improves Bipolar 2 disorder: takes long-acting injectable q2 months, last taken 1 month ago Constipation: Miralax  17g daily, Bisacodyl  5mg  prn Thoracic outlet syndrome: holding home gabapentin  and duloxetine . Will restart when renal function improves  Diet: Renal VTE: Eliquis  Code: Full  Dispo: Anticipated discharge to Skilled nursing facility pending medical stability.   Kenneth Michael, MD PGY-1 Internal Medicine Resident Pager Number [phone] Please contact the on call pager after 5 pm and on weekends at [phone].

## 2023-11-30 NOTE — TOC Progression Note (Addendum)
 Transition of Care [facility]) - Progression Note    Patient Details  Name: Kenneth Hunt MRN: [id number] Date of Birth: 1980-10-25  [facility] CM/SW Contact  Carmon Christen, LCSWA Phone Number: 11/30/2023, 2:33 PM  Clinical Narrative:     Patients passr number approved [id number] E. CSW provided patient with SNF bed offers. Patient accepted SNF bed offer with [facility]. Rhonda with [facility] confirmed SNF bed for patient. Facility will start auth closer to patient being medically ready. CSW will continue to follow.  Expected Discharge Plan: Skilled Nursing Facility Barriers to Discharge: Continued Medical Work up  Expected Discharge Plan and Services In-house Referral: Clinical Social Work     Living arrangements for the past 2 months: Single Family Home                                       Social Determinants of Health (SDOH) Interventions SDOH Screenings   Food Insecurity: No Food Insecurity (11/25/2023)  Housing: Low Risk  (11/25/2023)  Transportation Needs: No Transportation Needs (11/25/2023)  Utilities: Not At Risk (11/25/2023)  Alcohol Screen: Low Risk  (10/09/2022)  Depression (PHQ2-9): Low Risk  (09/26/2023)  Financial Resource Strain: Low Risk  (10/09/2022)  Physical Activity: Inactive (10/09/2022)  Social Connections: Not on File (05/04/2023)   Received from [facility]  Stress: Stress Concern Present (10/09/2022)  Tobacco Use: Low Risk  (11/27/2023)    Readmission Risk Interventions     No data to display

## 2023-11-30 NOTE — Progress Notes (Signed)
 Patient ID: Kenneth Hunt, male   DOB: Jan 12, 1981, 43 y.o.   MRN: [id number] S: still complaining of leg pain O:BP 130/73 (BP Location: Left Wrist)   Pulse 82   Temp 97.8 F (36.6 C) (Oral)   Resp 13   Ht 5\' 3"  (1.6 m)   Wt 99.4 kg   SpO2 97%   BMI 38.82 kg/m   Intake/Output Summary (Last 24 hours) at 11/30/2023 1006 Last data filed at 11/30/2023 0330 Gross per 24 hour  Intake --  Output 1200 ml  Net -1200 ml   Intake/Output: I/O last 3 completed shifts: In: -  Out: 2350 [Urine:2350]  Intake/Output this shift:  No intake/output data recorded. Weight change:  Gen: NAD CVS: RRR Resp:CTA Abd: +BS,soft, mildly tender Ext:2+ tense edema bilaterally  Recent Labs  Lab 11/24/23 1300 11/24/23 1333 11/24/23 1740 11/24/23 1952 11/25/23 0325 11/25/23 0736 11/25/23 1952 11/25/23 2357 11/26/23 0524 11/27/23 0509 11/28/23 0424 11/29/23 0425 11/30/23 0452  NA 123*   < > 125*   < > 127*   < > 125* 125* 126* 126* 130* 131* 129*  K 6.5*   < > 5.6*   < > 4.8   < > 4.1 3.9 4.5 4.2 4.2 4.0 3.8  CL 87*   < > 91*   < > 86*   < > 81* 75* 79* 84* 80* 83* 80*  CO2 12*  --  11*   < > 16*   < > 21* 25 19* 23 26 28 29   GLUCOSE 94   < > 91   < > 96   < > 110* 108* 106* 110* 108* 111* 136*  BUN 127*   < > 128*   < > 132*   < > 144* 138* 150* 109* 130* 101* 117*  CREATININE 10.81*   < > 10.95*   < > 11.45*   < > 12.10* 11.37* 12.49* 10.30* 11.18* 10.13* 11.24*  ALBUMIN  3.1*  --  2.5*  --   --   --   --   --  2.2*  2.2* 2.1* 2.0* 2.1* 2.1*  CALCIUM  6.4*  --  6.2*   < > 6.2*   < > 5.8* 5.5* 6.3* 6.5* 6.9* 7.4* 7.2*  PHOS  --   --  9.2*  --  10.2*  --   --   --  11.4* 10.0* 11.8* 9.8* 9.5*  AST 1,578*  --   --   --   --   --   --   --  501*  --   --   --   --   ALT 688*  --   --   --   --   --   --   --  301*  --   --   --   --    < > = values in this interval not displayed.   Liver Function Tests: Recent Labs  Lab 11/24/23 1300 11/24/23 1740 11/26/23 0524 11/27/23 0509  11/28/23 0424 11/29/23 0425 11/30/23 0452  AST 1,578*  --  501*  --   --   --   --   ALT 688*  --  301*  --   --   --   --   ALKPHOS 63  --  45  --   --   --   --   BILITOT 1.0  --  1.2  --   --   --   --   PROT 6.9  --  5.1*  --   --   --   --   ALBUMIN  3.1*   < > 2.2*  2.2*   < > 2.0* 2.1* 2.1*   < > = values in this interval not displayed.   Recent Labs  Lab 11/24/23 1516  LIPASE 141*   No results for input(s): "AMMONIA" in the last 168 hours. CBC: Recent Labs  Lab 11/24/23 1300 11/24/23 1333 11/25/23 0325 11/26/23 0524 11/28/23 0424  WBC 18.9*  --  13.4* 13.6* 14.6*  NEUTROABS 15.7*  --   --   --  12.3*  HGB 15.2   < > 12.9* 11.5* 12.0*  HCT 42.2   < > 35.3* 30.7* 32.0*  MCV 84.7  --  84.0 81.6 84.0  PLT 271  --  216 209 253   < > = values in this interval not displayed.   Cardiac Enzymes: Recent Labs  Lab 11/24/23 1300 11/25/23 0736 11/26/23 0524 11/27/23 0508 11/30/23 0452  CKTOTAL >50,000* >50,000* 37,060* 22,353* 4,819*   CBG: No results for input(s): "GLUCAP" in the last 168 hours.  Iron Studies: No results for input(s): "IRON", "TIBC", "TRANSFERRIN", "FERRITIN" in the last 72 hours. Studies/Results: No results found.  acetaminophen   650 mg Oral Q8H   apixaban   10 mg Oral BID   Followed by   Cecily Cohen ON 12/04/2023] apixaban   5 mg Oral BID   Chlorhexidine  Gluconate Cloth  6 each Topical Q0600   polyethylene glycol  17 g Oral Daily   sevelamer  carbonate  1,600 mg Oral TID WC   sodium zirconium cyclosilicate   10 g Oral Daily    BMET    Component Value Date/Time   NA 129 (L) 11/30/2023 0452   K 3.8 11/30/2023 0452   CL 80 (L) 11/30/2023 0452   CO2 29 11/30/2023 0452   GLUCOSE 136 (H) 11/30/2023 0452   BUN 117 (H) 11/30/2023 0452   CREATININE 11.24 (H) 11/30/2023 0452   CREATININE 1.29 09/26/2023 1455   CALCIUM  7.2 (L) 11/30/2023 0452   GFRNONAA 5 (L) 11/30/2023 0452   GFRNONAA 92 07/23/2019 1426   GFRAA 58 (L) 03/05/2020 0345   GFRAA 106  07/23/2019 1426   CBC    Component Value Date/Time   WBC 14.6 (H) 11/28/2023 0424   RBC 3.81 (L) 11/28/2023 0424   HGB 12.0 (L) 11/28/2023 0424   HGB 16.1 08/20/2022 1424   HCT 32.0 (L) 11/28/2023 0424   HCT 47.8 08/20/2022 1424   PLT 253 11/28/2023 0424   PLT 313 08/20/2022 1424   MCV 84.0 11/28/2023 0424   MCV 93 08/20/2022 1424   MCH 31.5 11/28/2023 0424   MCHC 37.5 (H) 11/28/2023 0424   RDW 12.6 11/28/2023 0424   RDW 11.9 08/20/2022 1424   LYMPHSABS 0.8 11/28/2023 0424   LYMPHSABS 1.9 08/20/2022 1424   MONOABS 1.1 (H) 11/28/2023 0424   EOSABS 0.2 11/28/2023 0424   EOSABS 0.0 08/20/2022 1424   BASOSABS 0.0 11/28/2023 0424   BASOSABS 0.0 08/20/2022 1424    Assessment/Plan:   AKI- in the setting of severe rhabdomyolysis.  He was started on IVF's with some UOP but remains oliguric despite IV lasix .  He underwent RIJ temp HD catheter by IR followed by first HD session 11/26/23 due to lack of response to IVF's.  Able to UF 2 liters with HD and his UOP picked up to over a liter.  Unfortunately his BUN/Cr worsened and still has significant edema.  HD session #2 on 11/28/23.  Had  UF of 2L with HD and had 1.5 liters of UOP overnight.  BUN/Cr rising again and will plan for his 3rd HD session today.  Hopefully this will not be permanent.  Continue to follow daily UOP and BUN/Cr.  Rhabdomyolysis - presumably due to acute meth intoxication.  C/o leg pain.  Continue to follow CK levels now down to 4819 from >50,000 Acute bilateral DVT's - per primary svc Abnormal LFT's - due to meth and rhabdo.  Continue to follow. 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  AGMA - due to #1, on isotonic bicarb.   Benjamin Brands, MD [employer] [phone]

## 2023-12-01 DIAGNOSIS — N179 Acute kidney failure, unspecified: Secondary | ICD-10-CM | POA: Diagnosis not present

## 2023-12-01 DIAGNOSIS — M6282 Rhabdomyolysis: Secondary | ICD-10-CM | POA: Diagnosis not present

## 2023-12-01 LAB — RENAL FUNCTION PANEL
Albumin: 2.3 g/dL — ABNORMAL LOW (ref 3.5–5.0)
Anion gap: 17 — ABNORMAL HIGH (ref 5–15)
BUN: 81 mg/dL — ABNORMAL HIGH (ref 6–20)
CO2: 27 mmol/L (ref 22–32)
Calcium: 8.2 mg/dL — ABNORMAL LOW (ref 8.9–10.3)
Chloride: 87 mmol/L — ABNORMAL LOW (ref 98–111)
Creatinine, Ser: 9.12 mg/dL — ABNORMAL HIGH (ref 0.61–1.24)
GFR, Estimated: 7 mL/min — ABNORMAL LOW (ref >60)
Glucose, Bld: 142 mg/dL — ABNORMAL HIGH (ref 70–99)
Phosphorus: 7.3 mg/dL — ABNORMAL HIGH (ref 2.5–4.6)
Potassium: 4 mmol/L (ref 3.5–5.1)
Sodium: 131 mmol/L — ABNORMAL LOW (ref 135–145)

## 2023-12-01 LAB — HEPATITIS B SURFACE ANTIBODY, QUANTITATIVE: Hep B S AB Quant (Post): 14931 m[IU]/mL

## 2023-12-01 NOTE — Plan of Care (Signed)

## 2023-12-01 NOTE — Progress Notes (Signed)
                  Subjective:   Summary: 43 yo M with PMH significant for well-controlled HIV, bipolar 2, presenting with acute renal failure 2/2 rhabdomyolysis and acute bilateral DVTs in the setting of methamphetamine use and prolonged immobility.   No significant changes from yesterday and feeling well.   Objective:  Vital signs in last 24 hours: Vitals:   11/30/23 2124 11/30/23 2125 11/30/23 2205 12/01/23 0429  BP: 116/69 116/69 125/79 118/80  Pulse: 92 (!) 101 90 (!) 184  Resp: 16 19 18 18   Temp:  97.9 F (36.6 C) 98.2 F (36.8 C) 98 F (36.7 C)  TempSrc:  Oral Oral Oral  SpO2: 94% 96% 93% 98%  Weight:      Height:       Supplemental O2: Nasal Cannula SpO2: 98 % O2 Flow Rate (L/min): 2 L/min  Physical Exam:  Constitutional: fatigued appearing. No significant distress Extremities: edematous, erythematous bilateral lower extremities, similar to exam yesterday. Palpable DP pulses Neuro: A&O x 3  Assessment/Plan:   43 yo with ARF 2/2 rhabdomyolysis, acute bilateral LE DVTs with severe edema and foot drop.  She continues to have good urine output, still not filtering well.  Unsure how long he will continue to require dialysis.  His primary concern is about his legs which have shown only minimal improvement since arrival.  Acute renal failure 2/2 rhabdomyolysis  He continues to respond well to diuretics, still not filtering well evidenced by stable BUN/Creatinine ratio.  Completed 3 HD sessions so far.  - HD per nephrology - Lasix  120mg  q12h - Trend RFP - Strict I/Os, fluid restriction to  Acute bilateral LE deep vein thromboses R foot drop, suspect peroneal nerve injury Legs are still significantly swollen, no evidence of compartment syndrome on exam. - Eliquis  10mg  BID, will continue DOAC for anticipated 6 month course - Scheduled Tylenol  650mg  q8h, oral dilaudid  1mg  q4 prn, IV dilaudid  0.5mg  q2h prn  - PT following, recommend SNF at discharge  Anion gap  metabolic acidosis Bicarb improving daily with now improving UOP as well as HD - bicarb ggt per nephrology  Hyponatremia Hyperkalemia Hyperphosphatemia Hypocalcemia Electrolyte derangements slowly improving with return of kidney function and HD. No indication for repletion based on today's levels - Trend BMP - Sevelamer  carbonate 1600mg  TID  Elevated liver enzymes: downtrending, likely 2/2 rhabdo Leukocytosis: elevated likely in the setting of acute inflammation rather than infectious process HIV, well-controlled: holding home Biktarvy  due to ARF. Will resume once renal function improves Bipolar 2 disorder: takes long-acting injectable q2 months, last taken 1 month ago Constipation: Miralax  17g daily, Bisacodyl  5mg  prn Thoracic outlet syndrome: holding home gabapentin  and duloxetine . Will restart when renal function improves  Diet: Renal VTE: Eliquis  Code: Full  Dispo: Anticipated discharge to Skilled nursing facility pending medical stability.  Accepted bed at [facility], pending Auth and medical stability.  Cleven Dallas, DO Internal Medicine Resident, PGY-2 Please contact the on call pager at [phone] for any urgent or emergent needs. 6:53 AM 12/01/2023

## 2023-12-01 NOTE — Progress Notes (Signed)
 Patient ID: Rosevelt Vanderkolk, male   DOB: [birth year], 43 y.o.   MRN: [id number] S: No new complaints O:BP 121/76 (BP Location: Left Arm)   Pulse 92   Temp 98.1 F (36.7 C) (Oral)   Resp 18   Ht 5\' 3"  (1.6 m)   Wt 99.4 kg   SpO2 95%   BMI 38.82 kg/m   Intake/Output Summary (Last 24 hours) at 12/01/2023 1029 Last data filed at 12/01/2023 0431 Gross per 24 hour  Intake 300 ml  Output 4300 ml  Net -4000 ml   Intake/Output: I/O last 3 completed shifts: In: 300 [P.O.:300] Out: 4750 [Urine:1750; Other:3000]  Intake/Output this shift:  No intake/output data recorded. Weight change:  Gen: NAD CVS: RRR Resp:CTA Abd: +BS, soft, NT/ND Ext: 3+ tense edema bilateral lower ext  Recent Labs  Lab 11/24/23 1300 11/24/23 1333 11/24/23 1740 11/24/23 1952 11/25/23 0325 11/25/23 0736 11/25/23 2357 11/26/23 0524 11/27/23 0509 11/28/23 0424 11/29/23 0425 11/30/23 0452 12/01/23 0557  NA 123*   < > 125*   < > 127*   < > 125* 126* 126* 130* 131* 129* 131*  K 6.5*   < > 5.6*   < > 4.8   < > 3.9 4.5 4.2 4.2 4.0 3.8 4.0  CL 87*   < > 91*   < > 86*   < > 75* 79* 84* 80* 83* 80* 87*  CO2 12*  --  11*   < > 16*   < > 25 19* 23 26 28 29 27   GLUCOSE 94   < > 91   < > 96   < > 108* 106* 110* 108* 111* 136* 142*  BUN 127*   < > 128*   < > 132*   < > 138* 150* 109* 130* 101* 117* 81*  CREATININE 10.81*   < > 10.95*   < > 11.45*   < > 11.37* 12.49* 10.30* 11.18* 10.13* 11.24* 9.12*  ALBUMIN  3.1*  --  2.5*  --   --   --   --  2.2*  2.2* 2.1* 2.0* 2.1* 2.1* 2.3*  CALCIUM  6.4*  --  6.2*   < > 6.2*   < > 5.5* 6.3* 6.5* 6.9* 7.4* 7.2* 8.2*  PHOS  --    < > 9.2*  --  10.2*  --   --  11.4* 10.0* 11.8* 9.8* 9.5* 7.3*  AST 1,578*  --   --   --   --   --   --  501*  --   --   --   --   --   ALT 688*  --   --   --   --   --   --  301*  --   --   --   --   --    < > = values in this interval not displayed.   Liver Function Tests: Recent Labs  Lab 11/24/23 1300 11/24/23 1740 11/26/23 0524  11/27/23 0509 11/29/23 0425 11/30/23 0452 12/01/23 0557  AST 1,578*  --  501*  --   --   --   --   ALT 688*  --  301*  --   --   --   --   ALKPHOS 63  --  45  --   --   --   --   BILITOT 1.0  --  1.2  --   --   --   --   PROT 6.9  --  5.1*  --   --   --   --   ALBUMIN  3.1*   < > 2.2*  2.2*   < > 2.1* 2.1* 2.3*   < > = values in this interval not displayed.   Recent Labs  Lab 11/24/23 1516  LIPASE 141*   No results for input(s): "AMMONIA" in the last 168 hours. CBC: Recent Labs  Lab 11/24/23 1300 11/24/23 1333 11/25/23 0325 11/26/23 0524 11/28/23 0424  WBC 18.9*  --  13.4* 13.6* 14.6*  NEUTROABS 15.7*  --   --   --  12.3*  HGB 15.2   < > 12.9* 11.5* 12.0*  HCT 42.2   < > 35.3* 30.7* 32.0*  MCV 84.7  --  84.0 81.6 84.0  PLT 271  --  216 209 253   < > = values in this interval not displayed.   Cardiac Enzymes: Recent Labs  Lab 11/24/23 1300 11/25/23 0736 11/26/23 0524 11/27/23 0508 11/30/23 0452  CKTOTAL >50,000* >50,000* 37,060* 22,353* 4,819*   CBG: No results for input(s): "GLUCAP" in the last 168 hours.  Iron Studies: No results for input(s): "IRON", "TIBC", "TRANSFERRIN", "FERRITIN" in the last 72 hours. Studies/Results: No results found.  acetaminophen   650 mg Oral Q8H   apixaban   10 mg Oral BID   Followed by   Cecily Cohen ON 12/04/2023] apixaban   5 mg Oral BID   Chlorhexidine  Gluconate Cloth  6 each Topical Q0600   polyethylene glycol  17 g Oral Daily   sevelamer  carbonate  1,600 mg Oral TID WC    BMET    Component Value Date/Time   NA 131 (L) 12/01/2023 0557   K 4.0 12/01/2023 0557   CL 87 (L) 12/01/2023 0557   CO2 27 12/01/2023 0557   GLUCOSE 142 (H) 12/01/2023 0557   BUN 81 (H) 12/01/2023 0557   CREATININE 9.12 (H) 12/01/2023 0557   CREATININE 1.29 09/26/2023 1455   CALCIUM  8.2 (L) 12/01/2023 0557   GFRNONAA 7 (L) 12/01/2023 0557   GFRNONAA 92 07/23/2019 1426   GFRAA 58 (L) 03/05/2020 0345   GFRAA 106 07/23/2019 1426   CBC    Component  Value Date/Time   WBC 14.6 (H) 11/28/2023 0424   RBC 3.81 (L) 11/28/2023 0424   HGB 12.0 (L) 11/28/2023 0424   HGB 16.1 08/20/2022 1424   HCT 32.0 (L) 11/28/2023 0424   HCT 47.8 08/20/2022 1424   PLT 253 11/28/2023 0424   PLT 313 08/20/2022 1424   MCV 84.0 11/28/2023 0424   MCV 93 08/20/2022 1424   MCH 31.5 11/28/2023 0424   MCHC 37.5 (H) 11/28/2023 0424   RDW 12.6 11/28/2023 0424   RDW 11.9 08/20/2022 1424   LYMPHSABS 0.8 11/28/2023 0424   LYMPHSABS 1.9 08/20/2022 1424   MONOABS 1.1 (H) 11/28/2023 0424   EOSABS 0.2 11/28/2023 0424   EOSABS 0.0 08/20/2022 1424   BASOSABS 0.0 11/28/2023 0424   BASOSABS 0.0 08/20/2022 1424    Assessment/Plan:   AKI- in the setting of severe rhabdomyolysis.  He was started on IVF's with some UOP but remains oliguric despite IV lasix .  He underwent RIJ temp HD catheter by IR followed by first HD session 11/26/23 due to lack of response to IVF's.  Able to UF 2 liters with HD and his UOP picked up to over a liter.  Unfortunately his BUN/Cr worsened and still has significant edema.  HD session #2 on 11/28/23.  Had UF of 2L with HD and had 1.5 liters of  UOP overnight.  BUN/Cr rising again and will plan for his 3rd HD session 11/30/23.  Hopefully this will not be permanent.  Continue to follow daily UOP and BUN/Cr.  He may need HD again tomorrow if his BUN/Cr continue to rise. Rhabdomyolysis - presumably due to acute meth intoxication.  C/o leg pain.  Continue to follow CK levels now down to 4819 from >50,000 Acute bilateral DVT's - per primary svc Abnormal LFT's - due to meth and rhabdo.  Continue to follow. 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  AGMA - due to #1, on isotonic bicarb.   Will stop bicarb and follow.  Benjamin Brands, MD [employer] [phone]

## 2023-12-01 NOTE — Plan of Care (Signed)
  Problem: Education: Goal: Knowledge of General Education information will improve Description: Including pain rating scale, medication(s)/side effects and non-pharmacologic comfort measures Outcome: Progressing   Problem: Health Behavior/Discharge Planning: Goal: Ability to manage health-related needs will improve Outcome: Progressing   Problem: Clinical Measurements: Goal: Ability to maintain clinical measurements within normal limits will improve Outcome: Progressing Goal: Will remain free from infection Outcome: Progressing Goal: Diagnostic test results will improve Outcome: Progressing   Problem: Activity: Goal: Risk for activity intolerance will decrease Outcome: Progressing   Problem: Nutrition: Goal: Adequate nutrition will be maintained Outcome: Progressing   Problem: Coping: Goal: Level of anxiety will decrease Outcome: Progressing   Problem: Elimination: Goal: Will not experience complications related to urinary retention Outcome: Progressing   Problem: Pain Managment: Goal: General experience of comfort will improve and/or be controlled Outcome: Progressing   Problem: Safety: Goal: Ability to remain free from injury will improve Outcome: Progressing   Problem: Skin Integrity: Goal: Risk for impaired skin integrity will decrease Outcome: Progressing

## 2023-12-02 DIAGNOSIS — F1990 Other psychoactive substance use, unspecified, uncomplicated: Secondary | ICD-10-CM

## 2023-12-02 DIAGNOSIS — N179 Acute kidney failure, unspecified: Secondary | ICD-10-CM | POA: Diagnosis not present

## 2023-12-02 DIAGNOSIS — I82403 Acute embolism and thrombosis of unspecified deep veins of lower extremity, bilateral: Secondary | ICD-10-CM

## 2023-12-02 DIAGNOSIS — F1520 Other stimulant dependence, uncomplicated: Secondary | ICD-10-CM

## 2023-12-02 DIAGNOSIS — F3181 Bipolar II disorder: Secondary | ICD-10-CM | POA: Diagnosis not present

## 2023-12-02 LAB — RENAL FUNCTION PANEL
Albumin: 2.5 g/dL — ABNORMAL LOW (ref 3.5–5.0)
Anion gap: 18 — ABNORMAL HIGH (ref 5–15)
BUN: 101 mg/dL — ABNORMAL HIGH (ref 6–20)
CO2: 27 mmol/L (ref 22–32)
Calcium: 8.6 mg/dL — ABNORMAL LOW (ref 8.9–10.3)
Chloride: 86 mmol/L — ABNORMAL LOW (ref 98–111)
Creatinine, Ser: 10.95 mg/dL — ABNORMAL HIGH (ref 0.61–1.24)
GFR, Estimated: 5 mL/min — ABNORMAL LOW (ref >60)
Glucose, Bld: 111 mg/dL — ABNORMAL HIGH (ref 70–99)
Phosphorus: 8.4 mg/dL — ABNORMAL HIGH (ref 2.5–4.6)
Potassium: 4.3 mmol/L (ref 3.5–5.1)
Sodium: 131 mmol/L — ABNORMAL LOW (ref 135–145)

## 2023-12-02 NOTE — TOC Progression Note (Signed)
 Transition of Care [facility]) - Progression Note    Patient Details  Name: Kenneth Hunt MRN: [id number] Date of Birth: Nov 05, 1980  Transition of Care [facility]) CM/SW Contact  Carmon Christen, LCSWA Phone Number: 12/02/2023, 12:21 PM  Clinical Narrative:     CSW received call from [place] with [facility] who informed CSW they are rescinding their SNF bed offer, due to patients substance use history. Patient currently has no SNF bed offers.   Expected Discharge Plan: Skilled Nursing Facility Barriers to Discharge: Continued Medical Work up  Expected Discharge Plan and Services In-house Referral: Clinical Social Work     Living arrangements for the past 2 months: Single Family Home                                       Social Determinants of Health (SDOH) Interventions SDOH Screenings   Food Insecurity: No Food Insecurity (11/25/2023)  Housing: Low Risk  (11/25/2023)  Transportation Needs: No Transportation Needs (11/25/2023)  Utilities: Not At Risk (11/25/2023)  Alcohol Screen: Low Risk  (10/09/2022)  Depression (PHQ2-9): Low Risk  (09/26/2023)  Financial Resource Strain: Low Risk  (10/09/2022)  Physical Activity: Inactive (10/09/2022)  Social Connections: Not on File (05/04/2023)   Received from [facility]  Stress: Stress Concern Present (10/09/2022)  Tobacco Use: Low Risk  (11/27/2023)    Readmission Risk Interventions     No data to display

## 2023-12-02 NOTE — Hospital Course (Addendum)
 Acute renal failure 2/2 rhabdomyolysis Patient presented with several days of lower extremity swelling and oliguria in the setting of IV meth use and prolonged immobilization. Upon arrival patient was found to have acute renal failure with creatinine 10.8 from baseline ~1.0, BUN 127, CK >50,000, AST/ALT 1500/688, K 6.5. Patient had a temp cath placed and received multiple sessions of HD per nephrology throughout the course of the admission. Patient began making a significant amount of urine, though kidney filtration was slow to improve. Temp cath was removed on 4/26 as creatinine continued to downtrend. On 4/29 creatinine was still significantly elevated at 5.9, however patient was eager to be return home and was deemed stable to be discharged with PCP and nephrology follow up.   Acute bilateral lower extremity DVTs R foot drop, suspect peroneal nerve injury Patient was found to acute bilateral lower extremity DVTs in the setting of prolonged immobilization while lying with his legs folded back. On exam his legs were swollen, erythematous, warm with palpable pulses bilaterally. Eliquis  was initiated for DVTs. His legs were evaluated daily to ensure there was no compartment syndrome, legs continued to be warm and well-perfused. He additionally had a R foot drop with 0/5 strength in dorsiflexion, which showed little improvement by time of discharge. He was discharged with a DME cane and home health PT. Eliquis  5mg  BID was also started for DVT ppx, which he will likely need for an additional 6 months after discharge.   Anion gap metabolic acidosis Electrolyte derangements Patient initially had an AGMA requiring bicarb gtt, which was eventually discontinued by nephrology as his anion gap improved. He additionally had various electrolyte derangements including hyponatremia, hyperkalemia, hyperphosphatemia, and hypocalcemia consistent with acute renal failure. Patient received multiple sessions of HD and other  repletion as needed. Electrolytes were within normal limits on day of discharge. Bicarb continued to be mildly low.   Sinus tachycardia HR was intermittently elevated to 90s-100s in sinus rhythm. PT noted elevations to 140s with ambulation. Unsure of etiology, possibly due to low volume status vs deconditioning. Recommend outpatient follow up.   HIV, well-controlled: continued home Biktarvy  Elevated liver enzymes: downtrending, acute elevation was likely 2/2 rhabdo Leukocytosis: Resolved. Was likely elevated in the setting of acute inflammation. Bipolar 2 disorder: takes long-acting injectable Aristada q2 months, last taken around 3/23 Thoracic outlet syndrome: home duloxetine  was held due to ARF. Can restart if renal function continues to improve

## 2023-12-02 NOTE — Plan of Care (Signed)

## 2023-12-02 NOTE — Progress Notes (Signed)
                  Subjective:   Summary:43 yo M with PMH significant for well-controlled HIV, bipolar 2, presenting with acute renal failure 2/2 rhabdomyolysis and acute bilateral DVTs in the setting of methamphetamine use and prolonged immobility.  Hospital Day 8  Patient feeling similar to yesterday. Improved R leg mobility, similar foot drop  Objective:  Vital signs in last 24 hours: Vitals:   12/01/23 0835 12/01/23 1605 12/01/23 2000 12/02/23 0500  BP: 121/76 125/80 (!) 140/80 136/89  Pulse: 92 93 96 98  Resp: 18 20    Temp: 98.1 F (36.7 C) 98.1 F (36.7 C) 98.5 F (36.9 C) 98.7 F (37.1 C)  TempSrc: Oral Oral Oral Oral  SpO2: 95% 97% 96%   Weight:      Height:       Supplemental O2: none SpO2: 96 % O2 Flow Rate (L/min): 2 L/min  Physical Exam:  Constitutional: well-appearing, no acute distress Ext: edematous, well-perfused legs. 5/5 strength R foot plantarflexion, 0/5 dorsiflexion0 Neuro: A&O x 3  Assessment/Plan:   43 yo with ARF 2/2 rhabdomyolysis, acute bilateral lower extremity DVTs with severe edema and foot drop.  Continues to have good urine output, still not filtering well.  May receive fourth HD session today.  He has increased mobility in his legs but no improvement in his right foot drop.  No changes to the plan today.  Acute renal failure 2/2 rhabdomyolysis Continues to respond well to diuretics, however not filtering well evidenced by stable BUN/Creatinine ratio. Received 3 HD sessions thus far.  - HD per nephrology, possibly today - Lasix  120mg  q12h - Trend RFP - Strict I/Os, fluid restriction to 1200 mL  Acute bilateral LE deep vein thromboses R foot drop, suspect peroneal nerve injury Legs are still significantly swollen, no significant improvement. 0/5 dorsiflexion strength of R foot. No evidence of compartment syndrome on exam.  - Eliquis  10mg  BID, plan to continue DOAC after discharge for anticipated 6 month course - Scheduled Tylenol  650mg   q8h, oral dilaudid  1mg  q4 prn, IV dilaudid  0.5mg  q2h prn  - PT following, recommends SNF at discharge  Hyponatremia Hyperkalemia Hyperphosphatemia Hypocalcemia Various electrolyte derangements slowly improving with return of kidney function and HD. No indication for supplementation based on today's values.  - Trend RFP - Sevelamer  carbonate 1600mg  TID  Anion gap metabolic acidosis, improving  Now off bicarb drip with bicarb within normal limits. Still with anion gap, though appears to be improving. Continue to monitor with daily labs  Elevated liver enzymes: downtrending, likely 2/2 rhabdo Leukocytosis: elevated likely in the setting of acute inflammation rather than infectious process HIV, well-controlled: holding home Biktarvy  due to ARF. Will resume once renal function improves Bipolar 2 disorder: takes long-acting injectable q2 months, last taken 1 month ago Constipation: Miralax  17g daily, Bisacodyl  5mg  prn Thoracic outlet syndrome: holding home gabapentin  and duloxetine . Will restart when renal function improves  Diet: Renal, fluid restriction VTE: Eliquis  Code: Full  Dispo: Anticipated discharge to Skilled nursing facility pending medical stability.   Kenneth Michael, MD PGY-1 Internal Medicine Resident Pager Number [phone] Please contact the on call pager after 5 pm and on weekends at [phone].

## 2023-12-02 NOTE — Progress Notes (Signed)
 Nephrology Follow-Up Consult note   Assessment/Recommendations: Kenneth Hunt is a/an 43 y.o. male with a past medical history significant for HIV and bipolar 2, admitted for acute rhabdomyolysis related to methamphetamine use and immobility.       AKI secondary to rhabdo: Baseline creatinine normal.  Creatinine continues to rise.  No uremic symptoms today.  Fairly good chance for recovery but likely will take some time -Hold dialysis today but likely plan on dialysis tomorrow -Good urine output but on IV Lasix , continue to monitor for recovery -Continue to monitor daily Cr, Dose meds for GFR -Monitor Daily I/Os, Daily weight  -Maintain MAP>65 for optimal renal perfusion.  -Avoid nephrotoxic medications including NSAIDs -Use synthetic opioids (Fentanyl /Dilaudid ) if needed  Bilateral lower extremity DVT: Anticoagulation per primary team  Rhabdomyolysis: related to methamphetamine use and immobility.  Off IV hydration because of volume overload.  On IV Lasix .  Continue to monitor.  Did encourage oral hydration  Hyperphosphatemia: Continue sevelamer   Hyponatremia: Mild associated with renal failure.  Continue to monitor   Recommendations conveyed to primary service.    Levorn Reason [facility] 12/02/2023 11:47 AM  ___________________________________________________________  CC: Leg pain  Interval History/Subjective: Patient continues to have leg pain and cramping.  Otherwise no complaints   Medications:  Current Facility-Administered Medications  Medication Dose Route Frequency Provider Last Rate Last Admin   acetaminophen  (TYLENOL ) tablet 650 mg  650 mg Oral Q8H Jayson Michael, MD   650 mg at 12/02/23 1610   albuterol  (PROVENTIL ) (2.5 MG/3ML) 0.083% nebulizer solution 2.5 mg  2.5 mg Nebulization Q2H PRN Khan, Ghalib, MD   2.5 mg at 11/25/23 2307   apixaban  (ELIQUIS ) tablet 10 mg  10 mg Oral BID Jayson Michael, MD   10 mg at 12/02/23 1014   Followed by    Cecily Cohen ON 12/04/2023] apixaban  (ELIQUIS ) tablet 5 mg  5 mg Oral BID Jayson Michael, MD       bisacodyl  (DULCOLAX) EC tablet 5 mg  5 mg Oral Daily PRN Khan, Ghalib, MD   5 mg at 11/27/23 1613   Chlorhexidine  Gluconate Cloth 2 % PADS 6 each  6 each Topical Q0600 Charley Constable, MD   6 each at 12/02/23 0825   furosemide  (LASIX ) 120 mg in dextrose  5 % 50 mL IVPB  120 mg Intravenous Q12H Charley Constable, MD 62 mL/hr at 12/02/23 0611 120 mg at 12/02/23 9604   HYDROmorphone  (DILAUDID ) injection 0.5 mg  0.5 mg Intravenous Q2H PRN Khan, Ghalib, MD   0.5 mg at 11/26/23 1552   HYDROmorphone  (DILAUDID ) tablet 1 mg  1 mg Oral Q4H PRN Khan, Ghalib, MD   1 mg at 11/25/23 2029   nitroGLYCERIN  (NITROSTAT ) SL tablet 0.4 mg  0.4 mg Sublingual Q5 min PRN Sherol Dixie, MD   0.4 mg at 11/25/23 2300   polyethylene glycol (MIRALAX  / GLYCOLAX ) packet 17 g  17 g Oral Daily Jayson Michael, MD   17 g at 12/02/23 1014   sevelamer  carbonate (RENVELA ) tablet 1,600 mg  1,600 mg Oral TID WC Jackolyn Masker, MD   1,600 mg at 12/02/23 1128      Review of Systems: 10 systems reviewed and negative except per interval history/subjective  Physical Exam: Vitals:   12/01/23 2000 12/02/23 0500  BP: (!) 140/80 136/89  Pulse: 96 98  Resp:    Temp: 98.5 F (36.9 C) 98.7 F (37.1 C)  SpO2: 96%    No intake/output data recorded.  Intake/Output Summary (Last 24 hours) at  12/02/2023 1147 Last data filed at 12/02/2023 0500 Gross per 24 hour  Intake --  Output 1250 ml  Net -1250 ml   Constitutional: well-appearing, no acute distress ENMT: ears and nose without scars or lesions, MMM CV: normal rate, trace edema Respiratory: Bilateral chest rise, normal work of breathing Gastrointestinal: soft, non-tender, no palpable masses or hernias Skin: no visible lesions or rashes Psych: alert, judgement/insight appropriate, appropriate mood and affect   Test Results I personally reviewed new and old clinical labs and  radiology tests Lab Results  Component Value Date   NA 131 (L) 12/02/2023   K 4.3 12/02/2023   CL 86 (L) 12/02/2023   CO2 27 12/02/2023   BUN 101 (H) 12/02/2023   CREATININE 10.95 (H) 12/02/2023   CALCIUM  8.6 (L) 12/02/2023   ALBUMIN  2.5 (L) 12/02/2023   PHOS 8.4 (H) 12/02/2023    CBC Recent Labs  Lab 11/26/23 0524 11/28/23 0424  WBC 13.6* 14.6*  NEUTROABS  --  12.3*  HGB 11.5* 12.0*  HCT 30.7* 32.0*  MCV 81.6 84.0  PLT 209 253

## 2023-12-03 LAB — RENAL FUNCTION PANEL
Albumin: 2.7 g/dL — ABNORMAL LOW (ref 3.5–5.0)
Anion gap: 19 — ABNORMAL HIGH (ref 5–15)
BUN: 118 mg/dL — ABNORMAL HIGH (ref 6–20)
CO2: 24 mmol/L (ref 22–32)
Calcium: 8.6 mg/dL — ABNORMAL LOW (ref 8.9–10.3)
Chloride: 86 mmol/L — ABNORMAL LOW (ref 98–111)
Creatinine, Ser: 12.04 mg/dL — ABNORMAL HIGH (ref 0.61–1.24)
GFR, Estimated: 5 mL/min — ABNORMAL LOW (ref >60)
Glucose, Bld: 110 mg/dL — ABNORMAL HIGH (ref 70–99)
Phosphorus: 8.4 mg/dL — ABNORMAL HIGH (ref 2.5–4.6)
Potassium: 4.1 mmol/L (ref 3.5–5.1)
Sodium: 129 mmol/L — ABNORMAL LOW (ref 135–145)

## 2023-12-03 LAB — CBC
HCT: 32.4 % — ABNORMAL LOW (ref 39.0–52.0)
Hemoglobin: 11.5 g/dL — ABNORMAL LOW (ref 13.0–17.0)
MCH: 30.7 pg (ref 26.0–34.0)
MCHC: 35.5 g/dL (ref 30.0–36.0)
MCV: 86.4 fL (ref 80.0–100.0)
Platelets: 303 10*3/uL (ref 150–400)
RBC: 3.75 MIL/uL — ABNORMAL LOW (ref 4.22–5.81)
RDW: 12.4 % (ref 11.5–15.5)
WBC: 16.5 10*3/uL — ABNORMAL HIGH (ref 4.0–10.5)
nRBC: 0 % (ref 0.0–0.2)

## 2023-12-03 MED ORDER — HEPARIN SODIUM (PORCINE) 1000 UNIT/ML IJ SOLN
INTRAMUSCULAR | Status: AC
  Administered 2023-12-03: 1000 [IU]
  Filled 2023-12-03: qty 3

## 2023-12-03 NOTE — Progress Notes (Signed)
 PT Cancellation Note  Patient Details Name: Kenneth Hunt MRN: [id number] DOB: [birth year]   Cancelled Treatment:    Reason Eval/Treat Not Completed: (P) Patient at procedure or test/unavailable Pt off the floor for dialysis. PT will follow up for treatment tomorrow.   Jolicia Delira B. Jewel Mortimer PT, DPT Acute Rehabilitation Services Please use secure chat or  Call Office [phone]  Verlie Glisson [facility] 12/03/2023, 2:24 PM

## 2023-12-03 NOTE — Progress Notes (Signed)
 Subjective:   Summary:43 yo M with PMH significant for well-controlled HIV, bipolar 2, presenting with acute renal failure 2/2 rhabdomyolysis and acute bilateral DVTs in the setting of methamphetamine use and prolonged immobility.  Hospital Day 9  Patient is stable, no changes in R leg mobility or sensation changes. Foot drop stable.   Objective:  Vital signs in last 24 hours: Vitals:   12/02/23 1126 12/02/23 1626 12/02/23 2033 12/03/23 0557  BP: 136/86 131/82 139/81 133/81  Pulse:  93 99   Resp:  16 18 18   Temp:  98.6 F (37 C) 99.4 F (37.4 C) 98.8 F (37.1 C)  TempSrc:  Oral Oral Oral  SpO2:  99% 100%   Weight:      Height:        Weight change:    Intake/Output Summary (Last 24 hours) at 12/03/2023 1305 Last data filed at 12/03/2023 0930 Gross per 24 hour  Intake 662.92 ml  Output 2150 ml  Net -1487.08 ml   Net IO Since Admission: -18,528.51 mL [12/03/23 1305]  Supplemental O2: none SpO2: 100 % O2 Flow Rate (L/min): 2 L/min  Physical Exam:  Constitutional: well-appearing, no acute distress Cardio: RRR, no murmurs rubs or gallops  Pulm: lung sounds clear bilaterally, normal work of breathing Ext: edematous, well-perfused legs. 0/5 dorsiflexion of R foot.  Neuro: A&O x 3  Assessment/Plan:   43 yo with ARF 2/2 rhabdomyolysis, acute bilateral lower extremity DVTs with severe edema and foot drop.  Continues to have good urine output, still not filtering well. Receiving fourth HD session later today. He has more mobility in his legs since admission but no improvement in his right foot drop.   Acute renal failure 2/2 rhabdomyolysis Continues to respond well to diuretics with stable vitals, however not filtering well evidenced by stable BUN/Creatinine ratio today around 9:1. No signs of uremia. Received 3 HD sessions thus far. Concern for over diuresis at this time and in the setting of acute renal failure and will stop Lasix .  - HD today  - d/c  Lasix  120mg  q12h - Trend RFP - Strict I/Os, fluid restriction to 1200 mL  Acute bilateral LE deep vein thromboses R foot drop, suspect peroneal nerve injury Legs are still significantly swollen, no significant improvement. 0/5 dorsiflexion strength of R foot. No evidence of compartment syndrome on exam.  - Eliquis  10mg  BID, plan to continue DOAC after discharge for anticipated 6 month course - Scheduled Tylenol  650mg  q8h, oral dilaudid  1mg  q4 prn, IV dilaudid  0.5mg  q2h prn  - PT following, recommends SNF at discharge  Hyponatremia Hyperkalemia Hyperphosphatemia Hypocalcemia Various electrolyte derangements stable with return of kidney function and HD. No indication for supplementation based on today's values.  - Trend RFP - Sevelamer  carbonate 1600mg  TID  Anion gap metabolic acidosis, improving  Bicarb continues to be wnl though it has dropped 27-->24, will monitor due to concern for acidosis. No acidosis but continues to have elevated anion gap.  - Continue to monitor with daily labs  Elevated liver enzymes: was downtrending, likely 2/2 rhabdo Leukocytosis: elevated likely in the setting of acute inflammation rather than infectious process HIV, well-controlled: holding home Biktarvy  due to ARF. Will resume once renal function improves Bipolar 2 disorder: takes long-acting injectable q2 months, last taken 1 month ago Constipation: Miralax  17g daily, Bisacodyl  5mg  prn Thoracic outlet syndrome: holding home gabapentin  and duloxetine . Will restart when renal function  improves  Diet: Renal, fluid restriction VTE: Eliquis  Code: Full  Dispo: Anticipated discharge to Skilled nursing facility pending medical stability.    I have seen and examined the patient myself, and I have reviewed the note by Alfornia Imam, MS3 and was present during the interview and physical exam.    Signed: Jayson Michael, MD Internal Medicine Resident, PGY-1

## 2023-12-03 NOTE — Progress Notes (Signed)
 Orthopedic Tech Progress Note Patient Details:  Kenneth Hunt [birth year] [id number]  Ortho Devices Type of Ortho Device: Prafo boot/shoe Ortho Device/Splint Location: RLE Ortho Device/Splint Interventions: Ordered, Application, Adjustment   Post Interventions Patient Tolerated: Well Instructions Provided: Care of device  Gerlene Glassburn L [facility] 12/03/2023, 7:09 PM

## 2023-12-03 NOTE — TOC Progression Note (Addendum)
 Transition of Care [facility]) - Progression Note    Patient Details  Name: Kenneth Hunt MRN: [id number] Date of Birth: 1981-06-29  Transition of [facility]) CM/SW Contact  Carmon Christen, LCSWA Phone Number: 12/03/2023, 4:55 PM  Clinical Narrative:     Patient off the floor for dialysis . CSW will follow up to discuss DC plan with patient when able. No current SNF bed offers. CSW will continue to follow.  Expected Discharge Plan: Skilled Nursing Facility Barriers to Discharge: Continued Medical Work up  Expected Discharge Plan and Services In-house Referral: Clinical Social Work     Living arrangements for the past 2 months: Single Family Home                                       Social Determinants of Health (SDOH) Interventions SDOH Screenings   Food Insecurity: No Food Insecurity (11/25/2023)  Housing: Low Risk  (11/25/2023)  Transportation Needs: No Transportation Needs (11/25/2023)  Utilities: Not At Risk (11/25/2023)  Alcohol Screen: Low Risk  (10/09/2022)  Depression (PHQ2-9): Low Risk  (09/26/2023)  Financial Resource Strain: Low Risk  (10/09/2022)  Physical Activity: Inactive (10/09/2022)  Social Connections: Not on File (05/04/2023)   Received from [facility]  Stress: Stress Concern Present (10/09/2022)  Tobacco Use: Low Risk  (11/27/2023)    Readmission Risk Interventions     No data to display

## 2023-12-03 NOTE — Plan of Care (Signed)
  Problem: Education: Goal: Knowledge of General Education information will improve Description: Including pain rating scale, medication(s)/side effects and non-pharmacologic comfort measures Outcome: Progressing   Problem: Clinical Measurements: Goal: Will remain free from infection Outcome: Progressing   Problem: Nutrition: Goal: Adequate nutrition will be maintained Outcome: Progressing   Problem: Activity: Goal: Risk for activity intolerance will decrease Outcome: Progressing   Problem: Elimination: Goal: Will not experience complications related to bowel motility Outcome: Progressing   Problem: Elimination: Goal: Will not experience complications related to urinary retention Outcome: Progressing

## 2023-12-03 NOTE — Progress Notes (Signed)
 Nephrology Follow-Up Consult note   Assessment/Recommendations: Kenneth Hunt is a/an 43 y.o. male with a past medical history significant for HIV and bipolar 2, admitted for acute rhabdomyolysis related to methamphetamine use and immobility.       AKI secondary to rhabdo: Baseline creatinine normal.  Creatinine continues to rise.  No uremic symptoms.  Fairly good chance for recovery but likely will take some time -plan for dialysis today and then continue to monitor for signs of recovery -sstop IV Lasix  to see how much urine output he can do on his own -Continue to monitor daily Cr, Dose meds for GFR -Monitor Daily I/Os, Daily weight  -Maintain MAP>65 for optimal renal perfusion.  -Avoid nephrotoxic medications including NSAIDs -Use synthetic opioids (Fentanyl /Dilaudid ) if needed  Bilateral lower extremity DVT: Anticoagulation per primary team  Rhabdomyolysis: related to methamphetamine use and immobility.  Off IV hydration because of volume overload.  On IV Lasix .  Continue to monitor.  Did encourage oral hydration  Hyperphosphatemia: Continue sevelamer   Hyponatremia: Mild associated with renal failure.  Continue to monitor   Recommendations conveyed to primary service.    [name] 12/03/2023 1:08 PM  ___________________________________________________________  CC: Leg pain  Interval History/Subjective: patient continues to feel well with mild lower extremity pain.  Feels like he has not seen his calf.  Good appetite.  No nausea or vomiting   Medications:  Current Facility-Administered Medications  Medication Dose Route Frequency Provider Last Rate Last Admin   acetaminophen  (TYLENOL ) tablet 650 mg  650 mg Oral Q8H Jayson Michael, MD   650 mg at 12/03/23 0544   albuterol  (PROVENTIL ) (2.5 MG/3ML) 0.083% nebulizer solution 2.5 mg  2.5 mg Nebulization Q2H PRN Khan, Ghalib, MD   2.5 mg at 11/25/23 2307   apixaban  (ELIQUIS ) tablet 10 mg   10 mg Oral BID Jayson Michael, MD   10 mg at 12/03/23 1610   Followed by   Cecily Cohen ON 12/04/2023] apixaban  (ELIQUIS ) tablet 5 mg  5 mg Oral BID Jayson Michael, MD       bisacodyl  (DULCOLAX) EC tablet 5 mg  5 mg Oral Daily PRN Khan, Ghalib, MD   5 mg at 12/02/23 2329   Chlorhexidine  Gluconate Cloth 2 % PADS 6 each  6 each Topical Q0600 Charley Constable, MD   6 each at 12/03/23 0544   heparin  sodium (porcine) 1000 UNIT/ML injection            HYDROmorphone  (DILAUDID ) injection 0.5 mg  0.5 mg Intravenous Q2H PRN Khan, Ghalib, MD   0.5 mg at 11/26/23 1552   HYDROmorphone  (DILAUDID ) tablet 1 mg  1 mg Oral Q4H PRN Khan, Ghalib, MD   1 mg at 11/25/23 2029   nitroGLYCERIN  (NITROSTAT ) SL tablet 0.4 mg  0.4 mg Sublingual Q5 min PRN Sherol Dixie, MD   0.4 mg at 11/25/23 2300   polyethylene glycol (MIRALAX  / GLYCOLAX ) packet 17 g  17 g Oral Daily Jayson Michael, MD   17 g at 12/03/23 0948   sevelamer  carbonate (RENVELA ) tablet 1,600 mg  1,600 mg Oral TID WC Jackolyn Masker, MD   1,600 mg at 12/03/23 1243      Review of Systems: 10 systems reviewed and negative except per interval history/subjective  Physical Exam: Vitals:   12/03/23 0557 12/03/23 0751  BP: 133/81 138/88  Pulse:  93  Resp: 18 17  Temp: 98.8 F (37.1 C) 98.3 F (36.8 C)  SpO2:     Total I/O In: 120 [P.O.:120]  Out: -   Intake/Output Summary (Last 24 hours) at 12/03/2023 1308 Last data filed at 12/03/2023 0930 Gross per 24 hour  Intake 662.92 ml  Output 2150 ml  Net -1487.08 ml   Constitutional: well-appearing, no acute distress ENMT: ears and nose without scars or lesions, MMM CV: normal rate, trace edema Respiratory: Bilateral chest rise, normal work of breathing Gastrointestinal: soft, non-tender, no palpable masses or hernias Skin: no visible lesions or rashes Psych: alert, judgement/insight appropriate, appropriate mood and affect   Test Results I personally reviewed new and old clinical labs and radiology  tests Lab Results  Component Value Date   NA 129 (L) 12/03/2023   K 4.1 12/03/2023   CL 86 (L) 12/03/2023   CO2 24 12/03/2023   BUN 118 (H) 12/03/2023   CREATININE 12.04 (H) 12/03/2023   CALCIUM  8.6 (L) 12/03/2023   ALBUMIN  2.7 (L) 12/03/2023   PHOS 8.4 (H) 12/03/2023    CBC Recent Labs  Lab 11/28/23 0424 12/03/23 0742  WBC 14.6* 16.5*  NEUTROABS 12.3*  --   HGB 12.0* 11.5*  HCT 32.0* 32.4*  MCV 84.0 86.4  PLT 253 303

## 2023-12-03 NOTE — Progress Notes (Signed)
 Received  patient in bed.Alert and oriented x 4. Consent verified.  Access used : Right hd catheter that worked well.Dressing change done today.  Duration of treatment : 3 hours.  Uf goal: Met 2 L.  Hand off to the HD charge nurse and a prn nurse for post treatment assessment and disconnection from hd machine.

## 2023-12-04 LAB — CBC
HCT: 33.2 % — ABNORMAL LOW (ref 39.0–52.0)
Hemoglobin: 11.2 g/dL — ABNORMAL LOW (ref 13.0–17.0)
MCH: 29.6 pg (ref 26.0–34.0)
MCHC: 33.7 g/dL (ref 30.0–36.0)
MCV: 87.8 fL (ref 80.0–100.0)
Platelets: 314 10*3/uL (ref 150–400)
RBC: 3.78 MIL/uL — ABNORMAL LOW (ref 4.22–5.81)
RDW: 12.4 % (ref 11.5–15.5)
WBC: 12.5 10*3/uL — ABNORMAL HIGH (ref 4.0–10.5)
nRBC: 0 % (ref 0.0–0.2)

## 2023-12-04 LAB — RENAL FUNCTION PANEL
Albumin: 2.7 g/dL — ABNORMAL LOW (ref 3.5–5.0)
Anion gap: 16 — ABNORMAL HIGH (ref 5–15)
BUN: 77 mg/dL — ABNORMAL HIGH (ref 6–20)
CO2: 24 mmol/L (ref 22–32)
Calcium: 8.8 mg/dL — ABNORMAL LOW (ref 8.9–10.3)
Chloride: 93 mmol/L — ABNORMAL LOW (ref 98–111)
Creatinine, Ser: 8.77 mg/dL — ABNORMAL HIGH (ref 0.61–1.24)
GFR, Estimated: 7 mL/min — ABNORMAL LOW (ref >60)
Glucose, Bld: 109 mg/dL — ABNORMAL HIGH (ref 70–99)
Phosphorus: 6.9 mg/dL — ABNORMAL HIGH (ref 2.5–4.6)
Potassium: 4.1 mmol/L (ref 3.5–5.1)
Sodium: 133 mmol/L — ABNORMAL LOW (ref 135–145)

## 2023-12-04 NOTE — Progress Notes (Signed)
 Nephrology Follow-Up Consult note   Assessment/Recommendations: Kenneth Hunt is a/an 43 y.o. male with a past medical history significant for HIV and bipolar 2, admitted for acute rhabdomyolysis related to methamphetamine use and immobility.       AKI secondary to rhabdo: Baseline creatinine normal.  Creatinine continues to rise.  No uremic symptoms.  Fairly good chance for recovery but likely will take some time -last HD on 4/22 -Monitor for signs of recovery -reassess HD needs daily -high likelihood of recovery, however if he fails to improve significantly by the weekend we would plan on HD cath Monday and placement as an AKI -Continue to monitor daily Cr, Dose meds for GFR -Monitor Daily I/Os, Daily weight  -Maintain MAP>65 for optimal renal perfusion.  -Avoid nephrotoxic medications including NSAIDs -Use synthetic opioids (Fentanyl /Dilaudid ) if needed  Bilateral lower extremity DVT: Anticoagulation per primary team  Rhabdomyolysis: related to methamphetamine use and immobility.  Off IV hydration because of volume overload. Improved.  Continue to monitor.  Did encourage oral hydration  Hyperphosphatemia: Continue sevelamer   Hyponatremia: Mild associated with renal failure.  Continue to monitor   Recommendations conveyed to primary service.    Levorn Reason [facility] 12/04/2023 11:08 AM  ___________________________________________________________  CC: Leg pain  Interval History/Subjective: feels well with no complaints. HD yesterday with no issues.   Medications:  Current Facility-Administered Medications  Medication Dose Route Frequency Provider Last Rate Last Admin   acetaminophen  (TYLENOL ) tablet 650 mg  650 mg Oral Q8H Jayson Michael, MD   650 mg at 12/04/23 0505   albuterol  (PROVENTIL ) (2.5 MG/3ML) 0.083% nebulizer solution 2.5 mg  2.5 mg Nebulization Q2H PRN Khan, Ghalib, MD   2.5 mg at 11/25/23 2307   apixaban  (ELIQUIS ) tablet 5 mg  5 mg  Oral BID Jayson Michael, MD   5 mg at 12/04/23 0955   bisacodyl  (DULCOLAX) EC tablet 5 mg  5 mg Oral Daily PRN Khan, Ghalib, MD   5 mg at 12/02/23 2329   Chlorhexidine  Gluconate Cloth 2 % PADS 6 each  6 each Topical Q0600 Charley Constable, MD   6 each at 12/04/23 0509   HYDROmorphone  (DILAUDID ) injection 0.5 mg  0.5 mg Intravenous Q2H PRN Khan, Ghalib, MD   0.5 mg at 11/26/23 1552   HYDROmorphone  (DILAUDID ) tablet 1 mg  1 mg Oral Q4H PRN Khan, Ghalib, MD   1 mg at 11/25/23 2029   nitroGLYCERIN  (NITROSTAT ) SL tablet 0.4 mg  0.4 mg Sublingual Q5 min PRN Sherol Dixie, MD   0.4 mg at 11/25/23 2300   polyethylene glycol (MIRALAX  / GLYCOLAX ) packet 17 g  17 g Oral Daily Jayson Michael, MD   17 g at 12/04/23 0955   sevelamer  carbonate (RENVELA ) tablet 1,600 mg  1,600 mg Oral TID WC Jackolyn Masker, MD   1,600 mg at 12/04/23 6283      Review of Systems: 10 systems reviewed and negative except per interval history/subjective  Physical Exam: Vitals:   12/03/23 1941 12/04/23 0314  BP: 124/83 126/80  Pulse: 100 (!) 102  Resp: (!) 21 17  Temp: 98.5 F (36.9 C) 98.5 F (36.9 C)  SpO2: 95% 95%   No intake/output data recorded.  Intake/Output Summary (Last 24 hours) at 12/04/2023 1108 Last data filed at 12/04/2023 0314 Gross per 24 hour  Intake 240 ml  Output 4000 ml  Net -3760 ml   Constitutional: well-appearing, no acute distress ENMT: ears and nose without scars or lesions, MMM CV: normal rate, trace  edema Respiratory: Bilateral chest rise, normal work of breathing Gastrointestinal: soft, non-tender, no palpable masses or hernias Skin: no visible lesions or rashes Psych: alert, judgement/insight appropriate, appropriate mood and affect   Test Results I personally reviewed new and old clinical labs and radiology tests Lab Results  Component Value Date   NA 133 (L) 12/04/2023   K 4.1 12/04/2023   CL 93 (L) 12/04/2023   CO2 24 12/04/2023   BUN 77 (H) 12/04/2023   CREATININE 8.77  (H) 12/04/2023   CALCIUM  8.8 (L) 12/04/2023   ALBUMIN  2.7 (L) 12/04/2023   PHOS 6.9 (H) 12/04/2023    CBC Recent Labs  Lab 11/28/23 0424 12/03/23 0742 12/04/23 0510  WBC 14.6* 16.5* 12.5*  NEUTROABS 12.3*  --   --   HGB 12.0* 11.5* 11.2*  HCT 32.0* 32.4* 33.2*  MCV 84.0 86.4 87.8  PLT 253 303 314

## 2023-12-04 NOTE — Progress Notes (Signed)
 Physical Therapy Treatment Patient Details Name: Kenneth Hunt MRN: [id number] DOB: 27-Oct-1980 Today's Date: 12/04/2023   History of Present Illness Kenneth Hunt is a 43 year old male who is presenting with several days of lower extremity swelling/pain and oliguria.   PMH: bipolar disorder, HIV, anxiety, depression, thoracic outlet syndrome, IV drug use (methamphetamine), most recent use 3 days ago    PT Comments  Pt greeted supine in bed, pleasant and agreeable to PT session. Performed manual calf stretch to BLE and contract-relax PNF technique to PF/DF to increase flexibility in BLE prior to OOB mobility. Pt completed sit-to-stand and step-pivot transfers using RW with CGA. He engaged in ~102ft of forward/backward stepping in front of recliner chair using RW with CGA. Pt demonstrated R foot drop and compensated by increasing R hip/knee flex. Reviewed BLE self stretch for calfs using gait belt and encouraged pt to complete this multiple times a day. Will continue to follow acutely and advance appropriately.      If plan is discharge home, recommend the following: A lot of help with walking and/or transfers;Help with stairs or ramp for entrance;Assist for transportation;A lot of help with bathing/dressing/bathroom   Can travel by private vehicle     No  Equipment Recommendations  Rolling walker (2 wheels);BSC/3in1    Recommendations for Other Services       Precautions / Restrictions Precautions Precautions: Fall Recall of Precautions/Restrictions: Intact Restrictions Weight Bearing Restrictions Per Provider Order: No     Mobility  Bed Mobility Overal bed mobility: Needs Assistance Bed Mobility: Supine to Sit, Sit to Supine     Supine to sit: HOB elevated, Used rails, Supervision Sit to supine: HOB elevated, Used rails, Supervision   General bed mobility comments: Pt sat up on L side of bed with increased time. PT managed lines/leads. He brought BLE off EOB with HOB  elevated and use of bedrails. Pt able to reposition himself with use of bedrails. He scooted down for B feet to be in contact with footboard and reported a stretch when pressing down. Reviewed education on calf stretch using gait belt.    Transfers Overall transfer level: Needs assistance Equipment used: Ambulation equipment used, Rolling walker (2 wheels) Transfers: Sit to/from Stand, Bed to chair/wheelchair/BSC Sit to Stand: Supervision, Contact guard assist   Step pivot transfers: Contact guard assist       General transfer comment: Initially utilized stedy, pt powered up by pulling on front bar without physical assistance. He maintained static stance with BUE support on front bar for ~40secs. Good eccentric control with sitting. Pt took a seated rest, removed stedy, and educated pt on technique and sequencing using RW. Pt stood from lowest bed height with VC for proper hand positioning and CGA to power up into standing. He took short slow steps to recliner chair on his left, advancing RW with body maintained inside device. Transfer via Lift Equipment: Stedy  Ambulation/Gait Ambulation/Gait assistance: Clinical research associate (Feet): 5 Feet Assistive device: Rolling walker (2 wheels) Gait Pattern/deviations: Step-to pattern, Decreased dorsiflexion - right, Decreased step length - right, Decreased step length - left, Trunk flexed Gait velocity: decreased Gait velocity interpretation: <1.31 ft/sec, indicative of household ambulator   General Gait Details: Pt ambulated fwd/bkwd in front of recliner chair. He took increased time, demonstrated good weight shift, and short slow steps. Pt had RLE foot drop and increased hip flex and knee flex to advance foot, achieving foot clearence with steps. Pt was delayed with advancing RW, VC/TC  to maintain position inside the AD at all times.   Stairs             Wheelchair Mobility     Tilt Bed    Modified Rankin (Stroke  Patients Only)       Balance Overall balance assessment: Needs assistance Sitting-balance support: Feet supported, Bilateral upper extremity supported Sitting balance-Leahy Scale: Fair     Standing balance support: Bilateral upper extremity supported, During functional activity, Reliant on assistive device for balance Standing balance-Leahy Scale: Poor Standing balance comment: Pt dependendent on RW                            Communication Communication Communication: No apparent difficulties  Cognition Arousal: Alert Behavior During Therapy: WFL for tasks assessed/performed   PT - Cognitive impairments: No apparent impairments                         Following commands: Intact      Cueing Cueing Techniques: Verbal cues, Visual cues  Exercises Other Exercises Other Exercises: Self calf stretch with gait belt around ball of the foot x3 each side with 30 second hold to BLE (pt positioned seated EOB) Other Exercises: Manual calf stretch x3 each side held for 45sec to BLE (pt positioned in supine) Other Exercises: PNF contract-relax technique hold for 10sec x10 reps each side to increase flexibility of PF/DF in BLE (pt positioned in supine)    General Comments General comments (skin integrity, edema, etc.): VSS on RA. RLE edema with rubor present on shin/calf. Educated pt on calf stretch and encouraged him to complete it 3-4x/day.      Pertinent Vitals/Pain Pain Assessment Pain Assessment: Faces Faces Pain Scale: Hurts little more Pain Location: B calfs Pain Descriptors / Indicators: Aching, Discomfort, Sore, Stabbing, Tightness Pain Intervention(s): Monitored during session, Repositioned    Home Living                          Prior Function            PT Goals (current goals can now be found in the care plan section) Acute Rehab PT Goals Patient Stated Goal: Tolerate standing and walk Progress towards PT goals: Progressing toward  goals    Frequency    Min 2X/week      PT Plan      Co-evaluation              AM-PAC PT "6 Clicks" Mobility   Outcome Measure  Help needed turning from your back to your side while in a flat bed without using bedrails?: A Little Help needed moving from lying on your back to sitting on the side of a flat bed without using bedrails?: A Little Help needed moving to and from a bed to a chair (including a wheelchair)?: A Little Help needed standing up from a chair using your arms (e.g., wheelchair or bedside chair)?: A Little Help needed to walk in hospital room?: Total Help needed climbing 3-5 steps with a railing? : Total 6 Click Score: 14    End of Session Equipment Utilized During Treatment: Gait belt Activity Tolerance: Patient tolerated treatment well Patient left: in chair;with call bell/phone within reach Nurse Communication: Mobility status;Other (comment) (Asked for follow-up on AFO order as the device wasn't present in pt's room) PT Visit Diagnosis: Unsteadiness on feet (R26.81);Difficulty in walking, not elsewhere  classified (R26.2);Pain Pain - Right/Left: Right Pain - part of body: Leg     Time: 0924-1002 PT Time Calculation (min) (ACUTE ONLY): 38 min  Charges:    $Gait Training: 8-22 mins $Therapeutic Exercise: 8-22 mins $Therapeutic Activity: 8-22 mins PT General Charges $$ ACUTE PT VISIT: 1 Visit                     Glenford Lanes, PT, DPT Acute Rehabilitation Services Office: [phone] Secure Chat Preferred  Riva Chester 12/04/2023, 11:22 AM

## 2023-12-04 NOTE — Progress Notes (Signed)
 Subjective:   Summary:43 yo M with PMH significant for well-controlled HIV, bipolar 2, presenting with acute renal failure 2/2 rhabdomyolysis and acute bilateral DVTs in the setting of methamphetamine use and prolonged immobility.  Hospital Day 10  HD yesterday- removed 2L of dry weight for 3 hours. Well tolerated.  PT/OT did not evaluate him yesterday while he was on HD. Patient is stable, no changes in R leg symptoms. Foot drop stable. He denies feeling dehydrated.  Objective:  Vital signs in last 24 hours: Vitals:   12/03/23 1800 12/03/23 1817 12/03/23 1941 12/04/23 0314  BP:  (!) 130/98 124/83 126/80  Pulse:  98 100 (!) 102  Resp:  18 (!) 21 17  Temp:  98.7 F (37.1 C) 98.5 F (36.9 C) 98.5 F (36.9 C)  TempSrc:  Oral Oral Oral  SpO2:  96% 95% 95%  Weight: 94.6 kg     Height:        Weight change:    Intake/Output Summary (Last 24 hours) at 12/04/2023 0659 Last data filed at 12/04/2023 0314 Gross per 24 hour  Intake 360 ml  Output 4000 ml  Net -3640 ml   Net IO Since Admission: -22,288.51 mL [12/04/23 0659]  Supplemental O2: none SpO2: 95 % O2 Flow Rate (L/min): 2 L/min  Physical Exam:  Constitutional: well-appearing, no acute distress Cardio: Tachycardic, regular rhythm no murmurs rubs or gallops  Pulm: No wheezes or crackles, normal work of breathing Ext: less edematous than yesterday, well-perfused legs.  Neuro: A&O x 3  Assessment/Plan:   43 yo with ARF 2/2 rhabdomyolysis, acute bilateral lower extremity DVTs with severe edema and foot drop. Continues to have good urine output, still not filtering well. Received fourth HD session. He has more mobility in his legs since admission but no improvement in his right foot drop.   Acute renal failure 2/2 rhabdomyolysis Good urine output after last Lasix  dose in AM yesterday. BUN and Cr individually improve with stable ratio. No signs of uremia. Received 4 HD sessions thus far. He was mildly  tachycardic on exam after HD, though his Cr continues to improve and he denies dehydration. Unsure if he will need long term HD. Appreciate nephrology recommendations.  - Trend RFP - Strict I/Os, fluid restriction to 1200 mL  Acute bilateral LE deep vein thromboses R foot drop, suspect peroneal nerve injury Legs are still significantly swollen, mildly improved from yesterday. No changes in R foot dorsiflexion strength. PT was unable to see him yesterday and provide ankle foot orthosis as he was in HD. - Eliquis  10mg  BID, plan to continue DOAC after discharge for anticipated 6 month course - Scheduled Tylenol  650mg  q8h, oral dilaudid  1mg  q4 prn, IV dilaudid  0.5mg  q2h prn  - PT following, recommends SNF at discharge  Hyponatremia Hyperkalemia Hyperphosphatemia Hypocalcemia Various electrolyte derangements with mild improvements in sodium, 129-->133. Phosphorous 8.4-->6.9 after HD yesterday. Other electrolytes remain stable in the setting of return of kidney function. He was able to make urine today without Lasix , good sign of continued kidney function. No indication for supplementation based on today's values.  - Trend RFP - Sevelamer  carbonate 1600mg  TID  Anion gap metabolic acidosis, improving  Bicarb is stable wnl at 24. Will monitor due to concern for acidosis. No acidosis but continues to have elevated anion gap.  - Continue to monitor with daily labs  Elevated liver enzymes: was downtrending, likely 2/2 rhabdo  Leukocytosis: elevated likely in the setting of acute inflammation rather than infectious process HIV, well-controlled: holding home Biktarvy  due to ARF. Will resume once renal function improves Bipolar 2 disorder: takes long-acting injectable Aristada q2 months, last taken around 3/23 Constipation: Miralax  17g daily, Bisacodyl  5mg  prn Thoracic outlet syndrome: holding home gabapentin  and duloxetine . Will restart when renal function improves  Diet: Renal, fluid  restriction VTE: Eliquis  Code: Full  Dispo: Anticipated discharge to Skilled nursing facility pending medical stability.    I have seen and examined the patient myself, and I have reviewed the note by Alfornia Imam, MS3 and was present during the interview and physical exam.   Nephrology team recommending if patient fails to improve significantly by the weekend we may need to plan on HD cath and potentially outpatient HD.   Signed: Jayson Michael, MD Internal Medicine Resident, PGY-1

## 2023-12-04 NOTE — TOC Progression Note (Signed)
 Transition of Care [facility]) - Progression Note    Patient Details  Name: Kenneth Hunt MRN: [id number] Date of Birth: 06/28/1981  Transition of Care [facility]) CM/SW Contact  Carmon Christen, LCSWA Phone Number: 12/04/2023, 2:13 PM  Clinical Narrative:     CSW informed patient that [facility] rescinded their SNF bed offer. CSW informed patient no current SNF bed offers and informed patient of the barriers to placement. CSW spoke with patient about the [facility] to see if he would like for CSW to reach out to [facility] to see if he is a candidate. Patient agreeable for CSW to reach out to [facility]. All questions answered No further questions reported at this time.  Expected Discharge Plan: Skilled Nursing Facility Barriers to Discharge: Continued Medical Work up  Expected Discharge Plan and Services In-house Referral: Clinical Social Work     Living arrangements for the past 2 months: Single Family Home                                       Social Determinants of Health (SDOH) Interventions SDOH Screenings   Food Insecurity: No Food Insecurity (11/25/2023)  Housing: Low Risk  (11/25/2023)  Transportation Needs: No Transportation Needs (11/25/2023)  Utilities: Not At Risk (11/25/2023)  Alcohol Screen: Low Risk  (10/09/2022)  Depression (PHQ2-9): Low Risk  (09/26/2023)  Financial Resource Strain: Low Risk  (10/09/2022)  Physical Activity: Inactive (10/09/2022)  Social Connections: Not on File (05/04/2023)   Received from [facility]  Stress: Stress Concern Present (10/09/2022)  Tobacco Use: Low Risk  (11/27/2023)    Readmission Risk Interventions     No data to display

## 2023-12-04 NOTE — Progress Notes (Signed)
 Occupational Therapy Treatment Patient Details Name: Kenneth Hunt MRN: [id number] DOB: 08-10-1981 Today's Date: 12/04/2023   History of present illness Kenneth Hunt is a 43 year old male who is presenting with several days of lower extremity swelling/pain and oliguria.   PMH: bipolar disorder, HIV, anxiety, depression, thoracic outlet syndrome, IV drug use (methamphetamine), most recent use 3 days ago   OT comments  Pt making progress with functional goals. Pt required min A sit - stand from recliner to RW to walk to sink. Pt stood at sink for grooming/hygiene tasks CGA. Pt transferred to armchair for seated rest break before walking back to bed with RW. Pt sat EOB CGA, returned to supine with Sup. OT donned R Prafo boot with proper fit. OT will continue to follow acutely to maximize level of function and safety      If plan is discharge home, recommend the following:  A lot of help with walking and/or transfers;A lot of help with bathing/dressing/bathroom;Help with stairs or ramp for entrance;Assistance with cooking/housework;Assist for transportation   Equipment Recommendations  Other (comment) (defer)    Recommendations for Other Services      Precautions / Restrictions Precautions Precautions: Fall Recall of Precautions/Restrictions: Intact Restrictions Weight Bearing Restrictions Per Provider Order: No       Mobility Bed Mobility Overal bed mobility: Needs Assistance Bed Mobility: Sit to Supine       Sit to supine: Supervision   General bed mobility comments: pt in chair upon arrival. Returned to bed at end of session    Transfers Overall transfer level: Needs assistance Equipment used: Ambulation equipment used, Rolling walker (2 wheels) Transfers: Sit to/from Stand, Bed to chair/wheelchair/BSC Sit to Stand: Min assist     Step pivot transfers: Contact guard assist     General transfer comment: Sit - stand from recliner to min A, CGA for  functional mobility to walk to sink using RW     Balance Overall balance assessment: Needs assistance Sitting-balance support: Feet supported, Bilateral upper extremity supported Sitting balance-Leahy Scale: Fair     Standing balance support: Bilateral upper extremity supported, During functional activity, Reliant on assistive device for balance Standing balance-Leahy Scale: Poor                             ADL either performed or assessed with clinical judgement   ADL Overall ADL's : Needs assistance/impaired     Grooming: Wash/dry hands;Wash/dry face;Contact guard assist;Standing           Upper Body Dressing : Contact guard assist;Sitting       Toilet Transfer: Contact guard assist;Ambulation;Rolling walker (2 wheels);Cueing for safety   Toileting- Clothing Manipulation and Hygiene: Minimal assistance       Functional mobility during ADLs: Minimal assistance;Contact guard assist;Rolling walker (2 wheels);Cueing for safety      Extremity/Trunk Assessment Upper Extremity Assessment Upper Extremity Assessment: Overall WFL for tasks assessed   Lower Extremity Assessment Lower Extremity Assessment: Defer to PT evaluation        Vision Ability to See in Adequate Light: 0 Adequate Patient Visual Report: No change from baseline     Perception     Praxis     Communication Communication Communication: No apparent difficulties   Cognition Arousal: Alert Behavior During Therapy: WFL for tasks assessed/performed, Flat affect  Following commands: Intact        Cueing   Cueing Techniques: Verbal cues, Visual cues  Exercises      Shoulder Instructions       General Comments VSS on RA. RLE edema with rubor present on shin/calf. Educated pt on calf stretch and encouraged him to complete it 3-4x/day.    Pertinent Vitals/ Pain       Pain Assessment Pain Assessment: Faces Faces Pain Scale: Hurts  little more Pain Location: B calfs Pain Descriptors / Indicators: Aching, Discomfort, Sore, Tightness Pain Intervention(s): Limited activity within patient's tolerance, Monitored during session, Repositioned  Home Living                                          Prior Functioning/Environment              Frequency  Min 2X/week        Progress Toward Goals  OT Goals(current goals can now be found in the care plan section)  Progress towards OT goals: Progressing toward goals     Plan      Co-evaluation                 AM-PAC OT "6 Clicks" Daily Activity     Outcome Measure   Help from another person eating meals?: None Help from another person taking care of personal grooming?: A Little Help from another person toileting, which includes using toliet, bedpan, or urinal?: A Little Help from another person bathing (including washing, rinsing, drying)?: A Lot Help from another person to put on and taking off regular upper body clothing?: A Little Help from another person to put on and taking off regular lower body clothing?: A Lot 6 Click Score: 17    End of Session Equipment Utilized During Treatment: Rolling walker (2 wheels);Gait belt  OT Visit Diagnosis: Unsteadiness on feet (R26.81);Muscle weakness (generalized) (M62.81);Pain Pain - Right/Left:  (bilaterally) Pain - part of body: Leg;Ankle and joints of foot   Activity Tolerance Patient tolerated treatment well   Patient Left in bed;with call bell/phone within reach;with bed alarm set   Nurse Communication          Time: 4098-1191 OT Time Calculation (min): 17 min  Charges: OT General Charges $OT Visit: 1 Visit OT Treatments $Self Care/Home Management : 8-22 mins    Kenneth Hunt 12/04/2023, 1:25 PM

## 2023-12-05 DIAGNOSIS — R600 Localized edema: Secondary | ICD-10-CM | POA: Diagnosis not present

## 2023-12-05 DIAGNOSIS — N179 Acute kidney failure, unspecified: Secondary | ICD-10-CM | POA: Diagnosis not present

## 2023-12-05 HISTORY — DX: Localized edema: R60.0

## 2023-12-05 LAB — URINALYSIS, ROUTINE W REFLEX MICROSCOPIC
Bilirubin Urine: NEGATIVE
Glucose, UA: 50 mg/dL — AB
Ketones, ur: NEGATIVE mg/dL
Leukocytes,Ua: NEGATIVE
Nitrite: NEGATIVE
Protein, ur: 30 mg/dL — AB
Specific Gravity, Urine: 1.008 (ref 1.005–1.030)
pH: 7 (ref 5.0–8.0)

## 2023-12-05 LAB — RENAL FUNCTION PANEL
Albumin: 2.9 g/dL — ABNORMAL LOW (ref 3.5–5.0)
Anion gap: 18 — ABNORMAL HIGH (ref 5–15)
BUN: 89 mg/dL — ABNORMAL HIGH (ref 6–20)
CO2: 21 mmol/L — ABNORMAL LOW (ref 22–32)
Calcium: 8.9 mg/dL (ref 8.9–10.3)
Chloride: 93 mmol/L — ABNORMAL LOW (ref 98–111)
Creatinine, Ser: 9.29 mg/dL — ABNORMAL HIGH (ref 0.61–1.24)
GFR, Estimated: 7 mL/min — ABNORMAL LOW (ref >60)
Glucose, Bld: 113 mg/dL — ABNORMAL HIGH (ref 70–99)
Phosphorus: 8.2 mg/dL — ABNORMAL HIGH (ref 2.5–4.6)
Potassium: 4 mmol/L (ref 3.5–5.1)
Sodium: 132 mmol/L — ABNORMAL LOW (ref 135–145)

## 2023-12-05 LAB — GLUCOSE, CAPILLARY: Glucose-Capillary: 131 mg/dL — ABNORMAL HIGH (ref 70–99)

## 2023-12-05 MED ORDER — BICTEGRAVIR-EMTRICITAB-TENOFOV 50-200-25 MG PO TABS
1.0000 | ORAL_TABLET | Freq: Every day | ORAL | Status: DC
  Administered 2023-12-05 – 2023-12-10 (×6): 1 via ORAL
  Filled 2023-12-05 (×6): qty 1

## 2023-12-05 NOTE — Plan of Care (Signed)

## 2023-12-05 NOTE — Progress Notes (Signed)
 Subjective:   Summary:43 yo M with PMH significant for well-controlled HIV, bipolar 2, presenting with acute renal failure 2/2 rhabdomyolysis and acute bilateral DVTs in the setting of methamphetamine use and prolonged immobility.  Hospital Day 11  Was able to walk to the sink with PT/OT with assistance, farthest since admission. Unable to stand without assistance. He is still able to make good urine. He notes his mouth feels dry.  He feels good today, and now reports sensation to light touch at b/l anterior aspect of RLE has improved, sensation now feels equal in both legs. Foot drop 0/5, in the past his previous episode of foot drop has taken 4wks to recover.    Objective:  Vital signs in last 24 hours: Vitals:   12/04/23 0314 12/04/23 0800 12/04/23 1932 12/05/23 0627  BP: 126/80  (!) 149/80 125/88  Pulse: (!) 102 (!) 104 (!) 109 99  Resp: 17 19 18  (!) 26  Temp: 98.5 F (36.9 C)  98.6 F (37 C) 98.8 F (37.1 C)  TempSrc: Oral  Oral Oral  SpO2: 95% 94% 100% 95%  Weight:      Height:        Weight change:    Intake/Output Summary (Last 24 hours) at 12/05/2023 0743 Last data filed at 12/05/2023 0400 Gross per 24 hour  Intake 240 ml  Output 3450 ml  Net -3210 ml   Net IO Since Admission: -25,498.51 mL [12/05/23 0743]  Supplemental O2: none SpO2: 95 % O2 Flow Rate (L/min): 2 L/min  Physical Exam:  Constitutional: well-appearing, no acute distress HEENT: moist mucous membranes Cardio: Tachycardic, regular rhythm no murmurs rubs or gallops  Pulm: No wheezes or crackles, normal work of breathing Ext: Stable edema in b/l LE R>L, erythema on R LE, no erythema on LE, warm well-perfused legs  Neuro: A&O x 3  Assessment/Plan:   43 yo with ARF 2/2 rhabdomyolysis, acute bilateral lower extremity DVTs with severe edema and foot drop. Continues to have good urine output, still not filtering well. Received fourth HD session. He has more mobility and sensation in  his legs since admission but no improvement in his right foot drop.   Acute renal failure 2/2 rhabdomyolysis Anion Gap metabolic acidosis  Stable. Continues to make good urine output and remains in autodiuresis. BUN and Cr worsened today. Kidney function not currently showing signs of recovery. No uremic signs. He remains tachycardic after HD on 4/22, and today reports some dehydration. His BP remains in good range, he is not dry on exam. Received 4 HD sessions thus far. He may need permanent HD cath, appreciate nephrology recommendations.  - Trend RFP - Strict I/Os, fluid restriction - UA in setting of potential ATN   Acute bilateral LE deep vein thromboses R foot drop, suspect peroneal nerve injury Legs are still significantly swollen, mildly improved from yesterday. No changes in R foot dorsiflexion strength. PT was unable to see him yesterday and provide ankle foot orthosis as he was in HD. - Eliquis  10mg  BID, plan to continue DOAC after discharge for anticipated 6 month course - Scheduled Tylenol  650mg  q8h, oral dilaudid  1mg  q4 prn, IV dilaudid  0.5mg  q2h prn  - Pt is not eligible for SNF but pending [facility] program approval  Anion gap metabolic acidosis, improving  Bicarb 24-->21, reaching acidosis again that can hopefully be corrected with dialysis.  - Continue to monitor with daily labs  Hyponatremia Hyperkalemia Hyperphosphatemia Hypocalcemia Various electrolyte derangements stable. He continues to make urine today, good sign of continued kidney function. No indication for supplementation based on today's values.  - Trend RFP - Sevelamer  carbonate 1600mg  TID  HIV, well-controlled Chronic. 02/25 labs show undetectable RNA and CD4 count >800.  Restarting home Biktarvy  today after holding since admission.  Elevated liver enzymes: was downtrending, acute elevation was likely 2/2 rhabdo Leukocytosis: Remains elevated likely in the setting of acute inflammation rather than  infectious process Bipolar 2 disorder: takes long-acting injectable Aristada q2 months, last taken around 3/23 Constipation: Miralax  17g daily, Bisacodyl  5mg  prn Thoracic outlet syndrome: holding home gabapentin  and duloxetine . Will restart when renal function improves  Diet: Renal, fluid restriction VTE: Eliquis  Code: Full  Dispo: Anticipated discharge to Skilled nursing facility pending medical stability.   I have seen and examined the patient myself, and I have reviewed the note by Alfornia Imam, MS3 and was present during the interview and physical exam.   He continues to make limited progress. He has considerable UOP even after diuretics have been held. Continue HD per nephrology. Restarted his home Biktarvy  today. Working on [employer], which may give him more opportunity for his renal function to improve before needing to initiate outpatient HD.   Signed: Jayson Michael, MD Internal Medicine Resident, PGY-1

## 2023-12-05 NOTE — Progress Notes (Signed)
 Nephrology Follow-Up Consult note   Assessment/Recommendations: Kenneth Hunt is a/an 43 y.o. male with a past medical history significant for HIV and bipolar 2, admitted for acute rhabdomyolysis related to methamphetamine use and immobility.       AKI secondary to rhabdo: Baseline creatinine normal.  Creatinine continues to rise.  No uremic symptoms.  Fairly good chance for recovery but likely will take some time -last HD on 4/22 -Monitor for signs of recovery -reassess HD needs daily -high likelihood of recovery, however if he fails to improve significantly by the weekend we would plan on HD cath Monday and placement as an AKI -Continue to monitor daily Cr, Dose meds for GFR -Monitor Daily I/Os, Daily weight  -Maintain MAP>65 for optimal renal perfusion.  -Avoid nephrotoxic medications including NSAIDs -Use synthetic opioids (Fentanyl /Dilaudid ) if needed  Bilateral lower extremity DVT: Anticoagulation per primary team  Rhabdomyolysis: related to methamphetamine use and immobility.  Off IV hydration because of volume overload.  Oral hydration is sufficient  Hyperphosphatemia: Continue sevelamer .  Should improve his kidney function improves  Hyponatremia: Mild associated with renal failure.  Continue to monitor   Recommendations conveyed to primary service.    Levorn Reason [facility] 12/05/2023 10:10 AM  ___________________________________________________________  CC: Leg pain  Interval History/Subjective: Patient continues to feel well with no complaints.   Medications:  Current Facility-Administered Medications  Medication Dose Route Frequency Provider Last Rate Last Admin   acetaminophen  (TYLENOL ) tablet 650 mg  650 mg Oral Q8H Jayson Michael, MD   650 mg at 12/05/23 0603   albuterol  (PROVENTIL ) (2.5 MG/3ML) 0.083% nebulizer solution 2.5 mg  2.5 mg Nebulization Q2H PRN Khan, Ghalib, MD   2.5 mg at 11/25/23 2307   apixaban  (ELIQUIS ) tablet 5 mg   5 mg Oral BID Jayson Michael, MD   5 mg at 12/05/23 1610   bictegravir-emtricitabine -tenofovir  AF (BIKTARVY ) 50-200-25 MG per tablet 1 tablet  1 tablet Oral Daily Jayson Michael, MD       bisacodyl  (DULCOLAX) EC tablet 5 mg  5 mg Oral Daily PRN Khan, Ghalib, MD   5 mg at 12/04/23 2216   Chlorhexidine  Gluconate Cloth 2 % PADS 6 each  6 each Topical Q0600 Charley Constable, MD   6 each at 12/05/23 0800   HYDROmorphone  (DILAUDID ) injection 0.5 mg  0.5 mg Intravenous Q2H PRN Khan, Ghalib, MD   0.5 mg at 11/26/23 1552   HYDROmorphone  (DILAUDID ) tablet 1 mg  1 mg Oral Q4H PRN Khan, Ghalib, MD   1 mg at 11/25/23 2029   nitroGLYCERIN  (NITROSTAT ) SL tablet 0.4 mg  0.4 mg Sublingual Q5 min PRN Sherol Dixie, MD   0.4 mg at 11/25/23 2300   polyethylene glycol (MIRALAX  / GLYCOLAX ) packet 17 g  17 g Oral Daily Jayson Michael, MD   17 g at 12/05/23 0930   sevelamer  carbonate (RENVELA ) tablet 1,600 mg  1,600 mg Oral TID WC Jackolyn Masker, MD   1,600 mg at 12/05/23 9604      Review of Systems: 10 systems reviewed and negative except per interval history/subjective  Physical Exam: Vitals:   12/05/23 0800 12/05/23 0905  BP: 137/87   Pulse: 100   Resp: 19   Temp: 98.3 F (36.8 C) 98.5 F (36.9 C)  SpO2:     No intake/output data recorded.  Intake/Output Summary (Last 24 hours) at 12/05/2023 1010 Last data filed at 12/05/2023 0400 Gross per 24 hour  Intake 240 ml  Output 3450 ml  Net -3210  ml   Constitutional: well-appearing, no acute distress ENMT: ears and nose without scars or lesions, MMM CV: normal rate, trace edema Respiratory: Bilateral chest rise, normal work of breathing Gastrointestinal: soft, non-tender, no palpable masses or hernias Skin: no visible lesions or rashes Psych: alert, judgement/insight appropriate, appropriate mood and affect   Test Results I personally reviewed new and old clinical labs and radiology tests Lab Results  Component Value Date   NA 132 (L) 12/05/2023    K 4.0 12/05/2023   CL 93 (L) 12/05/2023   CO2 21 (L) 12/05/2023   BUN 89 (H) 12/05/2023   CREATININE 9.29 (H) 12/05/2023   CALCIUM  8.9 12/05/2023   ALBUMIN  2.9 (L) 12/05/2023   PHOS 8.2 (H) 12/05/2023    CBC Recent Labs  Lab 12/03/23 0742 12/04/23 0510  WBC 16.5* 12.5*  HGB 11.5* 11.2*  HCT 32.4* 33.2*  MCV 86.4 87.8  PLT 303 314

## 2023-12-05 NOTE — Progress Notes (Signed)
 Mobility Specialist Progress Note;   12/05/23 0922  Mobility  Activity Ambulated with assistance in room  Level of Assistance Minimal assist, patient does 75% or more  Assistive Device Front wheel walker  Distance Ambulated (ft) 15 ft  Activity Response Tolerated well  Mobility Referral Yes  Mobility visit 1 Mobility  Mobility Specialist Start Time (ACUTE ONLY) [id number]  Mobility Specialist Stop Time (ACUTE ONLY) 0943  Mobility Specialist Time Calculation (min) (ACUTE ONLY) 21 min   Pt agreeable to mobility. Started session, with actively assisting pt in bilateral calf exercises. Pt stating R BLE hurting more than the L. Required MinA to stand from EOB and ambulate in room to door and back. R foot drop displayed. HR up to 128 bpm w/ activity. Pt returned back to bed with all needs met. RN in room.   Janit Meline Mobility Specialist Please contact via SecureChat or [employer] [phone]

## 2023-12-06 DIAGNOSIS — N179 Acute kidney failure, unspecified: Secondary | ICD-10-CM | POA: Diagnosis not present

## 2023-12-06 DIAGNOSIS — R600 Localized edema: Secondary | ICD-10-CM | POA: Diagnosis not present

## 2023-12-06 LAB — RENAL FUNCTION PANEL
Albumin: 3 g/dL — ABNORMAL LOW (ref 3.5–5.0)
Anion gap: 18 — ABNORMAL HIGH (ref 5–15)
BUN: 92 mg/dL — ABNORMAL HIGH (ref 6–20)
CO2: 21 mmol/L — ABNORMAL LOW (ref 22–32)
Calcium: 8.9 mg/dL (ref 8.9–10.3)
Chloride: 95 mmol/L — ABNORMAL LOW (ref 98–111)
Creatinine, Ser: 9 mg/dL — ABNORMAL HIGH (ref 0.61–1.24)
GFR, Estimated: 7 mL/min — ABNORMAL LOW (ref >60)
Glucose, Bld: 153 mg/dL — ABNORMAL HIGH (ref 70–99)
Phosphorus: 7 mg/dL — ABNORMAL HIGH (ref 2.5–4.6)
Potassium: 3.6 mmol/L (ref 3.5–5.1)
Sodium: 134 mmol/L — ABNORMAL LOW (ref 135–145)

## 2023-12-06 LAB — CBC
HCT: 31.5 % — ABNORMAL LOW (ref 39.0–52.0)
Hemoglobin: 10.6 g/dL — ABNORMAL LOW (ref 13.0–17.0)
MCH: 29.9 pg (ref 26.0–34.0)
MCHC: 33.7 g/dL (ref 30.0–36.0)
MCV: 88.7 fL (ref 80.0–100.0)
Platelets: 389 10*3/uL (ref 150–400)
RBC: 3.55 MIL/uL — ABNORMAL LOW (ref 4.22–5.81)
RDW: 11.9 % (ref 11.5–15.5)
WBC: 8.1 10*3/uL (ref 4.0–10.5)
nRBC: 0 % (ref 0.0–0.2)

## 2023-12-06 MED ORDER — ONDANSETRON HCL 4 MG/2ML IJ SOLN
4.0000 mg | Freq: Three times a day (TID) | INTRAMUSCULAR | Status: DC | PRN
  Filled 2023-12-06: qty 2

## 2023-12-06 NOTE — Progress Notes (Signed)
 Physical Therapy Treatment Patient Details Name: Kenneth Hunt MRN: [id number] DOB: [birth year] Today's Date: 12/06/2023   History of Present Illness Kenneth Hunt is a 43 year old male who is presenting with several days of lower extremity swelling/pain and oliguria.   PMH: bipolar disorder, HIV, anxiety, depression, thoracic outlet syndrome, IV drug use (methamphetamine), most recent use 3 days ago    PT Comments  Pt seen for PT tx with pt agreeable, NT exiting room (pt required total assist for peri hygiene 2/2 incontinent BM, which is new for pt). Pt pleasant throughout session. PT provided RLE ace wrap for dorsiflexion assistance for gait training but minimal improvement noted. Pt is able to increase gait distances with RW. Pt performed 10x STS from EOB with supervision with focus on BLE strengthening & balance training. Pt would benefit from ongoing PT services to progress gait with LRAD, stair negotiation, & balance.    If plan is discharge home, recommend the following: A little help with walking and/or transfers;A little help with bathing/dressing/bathroom;Assistance with cooking/housework;Assist for transportation;Help with stairs or ramp for entrance   Can travel by private vehicle     Yes  Equipment Recommendations  Rolling walker (2 wheels);BSC/3in1    Recommendations for Other Services       Precautions / Restrictions Precautions Precautions: Fall Restrictions Weight Bearing Restrictions Per Provider Order: No     Mobility  Bed Mobility Overal bed mobility: Modified Independent Bed Mobility: Supine to Sit, Sit to Supine     Supine to sit: Modified independent (Device/Increase time), HOB elevated Sit to supine: Modified independent (Device/Increase time), HOB elevated        Transfers Overall transfer level: Needs assistance Equipment used: Rolling walker (2 wheels) Transfers: Sit to/from Stand Sit to Stand: Supervision           General  transfer comment: education/cuing re: need to push to standing vs BUE on RW    Ambulation/Gait Ambulation/Gait assistance: Contact guard assist Gait Distance (Feet): 150 Feet Assistive device: Rolling walker (2 wheels) Gait Pattern/deviations: Decreased step length - left, Decreased step length - right, Decreased stride length, Decreased dorsiflexion - right Gait velocity: decreased     General Gait Details: Increased RLE hip flexion during swing phase to compensate for impaired dorsiflexion. PT wraps RLE with ace wrap for dorsiflexion assist but little correction with this. PT provides cuing for more normalized gait pattern with pt attempting.   Stairs             Wheelchair Mobility     Tilt Bed    Modified Rankin (Stroke Patients Only)       Balance Overall balance assessment: Needs assistance Sitting-balance support: Feet supported, No upper extremity supported Sitting balance-Leahy Scale: Good     Standing balance support: During functional activity, Bilateral upper extremity supported, Reliant on assistive device for balance Standing balance-Leahy Scale: Fair                              Hotel manager: No apparent difficulties  Cognition Arousal: Alert Behavior During Therapy: WFL for tasks assessed/performed, Flat affect   PT - Cognitive impairments: No apparent impairments                         Following commands: Intact      Cueing Cueing Techniques: Verbal cues, Visual cues  Exercises Other Exercises Other Exercises: Pt performed 10x STS from  EOB without BUE support with supervision with focus on BLE strengthening & endurance training; cuing to not lean posteriorly on EOB with BLE for support with improving return demo.    General Comments General comments (skin integrity, edema, etc.): max HR 136 bpm      Pertinent Vitals/Pain Pain Assessment Pain Assessment: Faces Faces Pain Scale: Hurts  a little bit Pain Location: RLE Pain Descriptors / Indicators: Tightness (stiffness) Pain Intervention(s): Monitored during session    Home Living                          Prior Function            PT Goals (current goals can now be found in the care plan section) Acute Rehab PT Goals Patient Stated Goal: Tolerate standing and walk PT Goal Formulation: With patient Time For Goal Achievement: 12/10/23 Potential to Achieve Goals: Good Progress towards PT goals: Progressing toward goals    Frequency    Min 2X/week      PT Plan      Co-evaluation              AM-PAC PT "6 Clicks" Mobility   Outcome Measure  Help needed turning from your back to your side while in a flat bed without using bedrails?: None Help needed moving from lying on your back to sitting on the side of a flat bed without using bedrails?: None Help needed moving to and from a bed to a chair (including a wheelchair)?: A Little Help needed standing up from a chair using your arms (e.g., wheelchair or bedside chair)?: A Little Help needed to walk in hospital room?: A Little Help needed climbing 3-5 steps with a railing? : A Lot 6 Click Score: 19    End of Session   Activity Tolerance: Patient tolerated treatment well Patient left: in bed;with call bell/phone within reach;with bed alarm set   PT Visit Diagnosis: Unsteadiness on feet (R26.81);Difficulty in walking, not elsewhere classified (R26.2);Muscle weakness (generalized) (M62.81);Other abnormalities of gait and mobility (R26.89)     Time: 1441-1500 PT Time Calculation (min) (ACUTE ONLY): 19 min  Charges:    $Therapeutic Activity: 8-22 mins PT General Charges $$ ACUTE PT VISIT: 1 Visit                     [name], PT, DPT 12/06/23, 3:10 PM   [name] 12/06/2023, 3:08 PM

## 2023-12-06 NOTE — Progress Notes (Signed)
                  Subjective:   Summary: 43 yo M with PMH significant for well-controlled HIV, bipolar 2, presenting with acute renal failure 2/2 rhabdomyolysis and acute bilateral DVTs in the setting of methamphetamine use and prolonged immobility.  Hospital Day 12  Patient feeling well today. Legs still feel "tight".   Objective:  Vital signs in last 24 hours: Vitals:   12/05/23 1524 12/05/23 2019 12/06/23 0544 12/06/23 0737  BP: (!) 148/82 (!) 159/87 (!) 142/72 (!) 148/83  Pulse: (!) 105 99 (!) 101   Resp: 19 18 18    Temp: 98.4 F (36.9 C) 98.9 F (37.2 C) (!) 97.3 F (36.3 C) 97.8 F (36.6 C)  TempSrc:  Oral Oral Oral  SpO2: 100% 98% 99% 98%  Weight:      Height:       Supplemental O2: Room Air SpO2: 98 % O2 Flow Rate (L/min): 2 L/min  Physical Exam:  Constitutional: well appearing, no acute distress Ext: lower legs swollen, similar to yesterday. Warm and well-perfused Neuro: A&O x 3  Assessment/Plan:   Acute renal failure 2/2 rhabdomyolysis Mild improvement in creatinine 9.0 from 9.3. 2.8L UOP. UA performed yesterday with no evidence of casts. Per nephrology, since creatinine has been improving he may be able to have temp cath pulled tomorrow. Fluid restrictions removed since patient continues to have appropriate levels of urine output. Nephrology following, appreciate assistance.  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 Blx LE edema and foot drop still with little improvement. PT following, possibility of STAR program pending.   Anion gap metabolic acidosis, improving Bicarb 21, anion gap 18. Continues to improve since admission  Hyponatremia Hyperkalemia Hyperphosphatemia Hypocalcemia Various electrolyte derangements have been stable. Renal filtration is still poor. No indication for repletion based on today's values.  - Trend RFP -  Sevelamer  carbonate 1600mg  TID  HIV, well-controlled: Home Biktarvy  restarted Elevated liver enzymes: was downtrending, acute elevation was likely 2/2 rhabdo Leukocytosis: Remains elevated likely in the setting of acute inflammation rather than infectious process Bipolar 2 disorder: takes long-acting injectable Aristada q2 months, last taken around 3/23 Constipation: Miralax  17g daily, bisacodyl  5mg  prn Thoracic outlet syndrome: holding home gabapentin  and duloxetine . Will restart when renal function improves  Diet: Renal, no fluid restriction VTE: Eliquis  Code: Full  Dispo: Anticipated discharge to Skilled nursing facility pending medical stability.   Jayson Michael, MD PGY-1 Internal Medicine Resident Pager Number [phone] Please contact the on call pager after 5 pm and on weekends at [phone].

## 2023-12-06 NOTE — Progress Notes (Signed)
 Nephrology Follow-Up Consult note   Assessment/Recommendations: Kenneth Hunt is a/an 43 y.o. male with a past medical history significant for HIV and bipolar 2, admitted for acute rhabdomyolysis related to methamphetamine use and immobility.       AKI secondary to rhabdo: Baseline creatinine normal.  Creatinine continues to rise.  No uremic symptoms.  Finally with some downtrending creatinine -last HD on 4/22 -Monitor for signs of recovery -If creatinine downtrending tomorrow likely would remove dialysis catheter -reassess HD needs daily -Continue to monitor daily Cr, Dose meds for GFR -Monitor Daily I/Os, Daily weight  -Maintain MAP>65 for optimal renal perfusion.  -Avoid nephrotoxic medications including NSAIDs -Use synthetic opioids (Fentanyl /Dilaudid ) if needed  Bilateral lower extremity DVT: Anticoagulation per primary team  Rhabdomyolysis: related to methamphetamine use and immobility.  Off IV hydration because of volume overload.  Oral hydration is sufficient  Hyperphosphatemia: Continue sevelamer .  Should improve his kidney function improves  Hyponatremia: Mild associated with renal failure.  Continue to monitor   Recommendations conveyed to primary service.    Levorn Reason [facility] 12/06/2023 2:10 PM  ___________________________________________________________  CC: Leg pain  Interval History/Subjective: Findings with slight downtrending creatinine.  Patient continues to feel well.  Urinating well.   Medications:  Current Facility-Administered Medications  Medication Dose Route Frequency Provider Last Rate Last Admin   acetaminophen  (TYLENOL ) tablet 650 mg  650 mg Oral Q8H Jayson Michael, MD   650 mg at 12/06/23 1232   albuterol  (PROVENTIL ) (2.5 MG/3ML) 0.083% nebulizer solution 2.5 mg  2.5 mg Nebulization Q2H PRN Khan, Ghalib, MD   2.5 mg at 11/25/23 2307   apixaban  (ELIQUIS ) tablet 5 mg  5 mg Oral BID Jayson Michael, MD   5 mg at  12/06/23 1610   bictegravir-emtricitabine -tenofovir  AF (BIKTARVY ) 50-200-25 MG per tablet 1 tablet  1 tablet Oral Daily Jayson Michael, MD   1 tablet at 12/06/23 0740   bisacodyl  (DULCOLAX) EC tablet 5 mg  5 mg Oral Daily PRN Khan, Ghalib, MD   5 mg at 12/04/23 2216   Chlorhexidine  Gluconate Cloth 2 % PADS 6 each  6 each Topical Q0600 Charley Constable, MD   6 each at 12/06/23 1019   HYDROmorphone  (DILAUDID ) injection 0.5 mg  0.5 mg Intravenous Q2H PRN Khan, Ghalib, MD   0.5 mg at 11/26/23 1552   HYDROmorphone  (DILAUDID ) tablet 1 mg  1 mg Oral Q4H PRN Khan, Ghalib, MD   1 mg at 11/25/23 2029   nitroGLYCERIN  (NITROSTAT ) SL tablet 0.4 mg  0.4 mg Sublingual Q5 min PRN Sherol Dixie, MD   0.4 mg at 11/25/23 2300   ondansetron  (ZOFRAN ) injection 4 mg  4 mg Intravenous Q8H PRN Justus, Michael, MD       polyethylene glycol (MIRALAX  / GLYCOLAX ) packet 17 g  17 g Oral Daily Jayson Michael, MD   17 g at 12/05/23 0930   sevelamer  carbonate (RENVELA ) tablet 1,600 mg  1,600 mg Oral TID WC Jackolyn Masker, MD   1,600 mg at 12/06/23 1232      Review of Systems: 10 systems reviewed and negative except per interval history/subjective  Physical Exam: Vitals:   12/06/23 0544 12/06/23 0737  BP: (!) 142/72 (!) 148/83  Pulse: (!) 101   Resp: 18   Temp: (!) 97.3 F (36.3 C) 97.8 F (36.6 C)  SpO2: 99% 98%   Total I/O In: -  Out: 1500 [Urine:1500]  Intake/Output Summary (Last 24 hours) at 12/06/2023 1410 Last data filed at 12/06/2023 1349 Gross  per 24 hour  Intake --  Output 3600 ml  Net -3600 ml   Constitutional: well-appearing, no acute distress ENMT: ears and nose without scars or lesions, MMM CV: normal rate, trace edema Respiratory: Bilateral chest rise, normal work of breathing Gastrointestinal: soft, non-tender, no palpable masses or hernias Skin: no visible lesions or rashes Psych: alert, judgement/insight appropriate, appropriate mood and affect   Test Results I personally reviewed  new and old clinical labs and radiology tests Lab Results  Component Value Date   NA 134 (L) 12/06/2023   K 3.6 12/06/2023   CL 95 (L) 12/06/2023   CO2 21 (L) 12/06/2023   BUN 92 (H) 12/06/2023   CREATININE 9.00 (H) 12/06/2023   CALCIUM  8.9 12/06/2023   ALBUMIN  3.0 (L) 12/06/2023   PHOS 7.0 (H) 12/06/2023    CBC Recent Labs  Lab 12/03/23 0742 12/04/23 0510 12/06/23 0811  WBC 16.5* 12.5* 8.1  HGB 11.5* 11.2* 10.6*  HCT 32.4* 33.2* 31.5*  MCV 86.4 87.8 88.7  PLT [phone]

## 2023-12-06 NOTE — Plan of Care (Signed)

## 2023-12-06 NOTE — Progress Notes (Signed)
 Mobility Specialist Progress Note:    12/06/23 1115  Mobility  Activity Ambulated with assistance in hallway  Level of Assistance Contact guard assist, steadying assist  Assistive Device Front wheel walker  Distance Ambulated (ft) 70 ft  Activity Response Tolerated well  Mobility Referral Yes  Mobility visit 1 Mobility  Mobility Specialist Start Time (ACUTE ONLY) [id number]  Mobility Specialist Stop Time (ACUTE ONLY) [id number]  Mobility Specialist Time Calculation (min) (ACUTE ONLY) 15 min   Pt received in bed and agreeable. Upon standing, found to have had BM in bed. NT notified. C/o BLE stiffness and soreness during ambulation. Returned to room w/o fault. Pt left in bed with call bell and all needs met.  D'Vante Nolon Baxter Mobility Specialist Please contact via Special educational needs teacher or Rehab office at [phone]

## 2023-12-06 NOTE — Care Management Important Message (Signed)
 Important Message  Patient Details  Name: Kenneth Hunt MRN: [id number] Date of Birth: Oct 15, 1980   Important Message Given:  Yes - Medicare IM     Wynonia Hedges 12/06/2023, 3:52 PM

## 2023-12-07 DIAGNOSIS — R600 Localized edema: Secondary | ICD-10-CM | POA: Diagnosis not present

## 2023-12-07 DIAGNOSIS — I82403 Acute embolism and thrombosis of unspecified deep veins of lower extremity, bilateral: Secondary | ICD-10-CM | POA: Diagnosis not present

## 2023-12-07 DIAGNOSIS — N179 Acute kidney failure, unspecified: Secondary | ICD-10-CM | POA: Diagnosis not present

## 2023-12-07 LAB — RENAL FUNCTION PANEL
Albumin: 3 g/dL — ABNORMAL LOW (ref 3.5–5.0)
Anion gap: 14 (ref 5–15)
BUN: 95 mg/dL — ABNORMAL HIGH (ref 6–20)
CO2: 23 mmol/L (ref 22–32)
Calcium: 8.9 mg/dL (ref 8.9–10.3)
Chloride: 97 mmol/L — ABNORMAL LOW (ref 98–111)
Creatinine, Ser: 8.55 mg/dL — ABNORMAL HIGH (ref 0.61–1.24)
GFR, Estimated: 7 mL/min — ABNORMAL LOW (ref >60)
Glucose, Bld: 128 mg/dL — ABNORMAL HIGH (ref 70–99)
Phosphorus: 6.9 mg/dL — ABNORMAL HIGH (ref 2.5–4.6)
Potassium: 3.5 mmol/L (ref 3.5–5.1)
Sodium: 134 mmol/L — ABNORMAL LOW (ref 135–145)

## 2023-12-07 LAB — CBC
HCT: 31.7 % — ABNORMAL LOW (ref 39.0–52.0)
Hemoglobin: 10.9 g/dL — ABNORMAL LOW (ref 13.0–17.0)
MCH: 30.7 pg (ref 26.0–34.0)
MCHC: 34.4 g/dL (ref 30.0–36.0)
MCV: 89.3 fL (ref 80.0–100.0)
Platelets: 403 10*3/uL — ABNORMAL HIGH (ref 150–400)
RBC: 3.55 MIL/uL — ABNORMAL LOW (ref 4.22–5.81)
RDW: 11.9 % (ref 11.5–15.5)
WBC: 7.4 10*3/uL (ref 4.0–10.5)
nRBC: 0 % (ref 0.0–0.2)

## 2023-12-07 MED ORDER — ORAL CARE MOUTH RINSE: 15.0000 mL | OROMUCOSAL | Status: DC | PRN

## 2023-12-07 NOTE — Progress Notes (Signed)
                  Subjective:   Summary: 43 yo M with PMH significant for well-controlled HIV, bipolar 2, presenting with acute renal failure 2/2 rhabdomyolysis and acute bilateral DVTs in the setting of methamphetamine use and prolonged immobility.  Hospital Day 13  Patient reports his legs still feel tight. He is able to ambulate around his room, though cannot tolerate standing for very long. He has been eating and drinking well, encouraged lots of fluid intake.   Objective:  Vital signs in last 24 hours: Vitals:   12/06/23 0737 12/06/23 1705 12/06/23 2024 12/07/23 0222  BP: (!) 148/83 138/86 (!) 141/86 136/79  Pulse:  (!) 103 (!) 111 (!) 103  Resp:  18 17 18   Temp: 97.8 F (36.6 C) 98.4 F (36.9 C) 98.2 F (36.8 C) 98.3 F (36.8 C)  TempSrc: Oral Oral Oral Oral  SpO2: 98% 100% 98% 98%  Weight:      Height:       Supplemental O2: Room Air SpO2: 98 % O2 Flow Rate (L/min): 2 L/min  Physical Exam:  Constitutional: well appearing, no acute distress Ext: lower legs swollen, similar to yesterday. Warm and well-perfused Neuro: A&O x 3  Assessment/Plan:   Acute renal failure 2/2 rhabdomyolysis Creatinine continues to downtrend slightly, 8.5 from 9.0 yesterday. BUN increasing, 95 from 92 yesterday. Per nephrology, temp cath may be pulled today if renal function improves. He continues to put out a significant amount of urine, nearly 4L yesterday. 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 Blx LE edema and foot drop still with little improvement. PT following, possibility of STAR program pending. Encouraged ambulating out of bed and using orthotic to help with foot drop.   Sinus tachycardia HR has been consistently elevated to the 100s. He has been on fluid restriction  with significant urine output, possibly contributing to hypovolemia that may be contributing to tachycardia. Fluid restrictions have been removed and I encouraged po fluid intake.   Anion gap metabolic acidosis, improving Bicarb 23, anion gap 14. Continues to improve since admission  Hyponatremia Hyperkalemia Hyperphosphatemia Hypocalcemia Various electrolyte derangements have been stable. Renal filtration is still poor. No indication for repletion based on today's values.  - Trend RFP - Sevelamer  carbonate 1600mg  TID  HIV, well-controlled: continue home Biktarvy  Elevated liver enzymes: was downtrending, acute elevation was likely 2/2 rhabdo Leukocytosis: Remains elevated likely in the setting of acute inflammation rather than infectious process Bipolar 2 disorder: takes long-acting injectable Aristada q2 months, last taken around 3/23 Constipation: Miralax  17g daily, bisacodyl  5mg  prn Thoracic outlet syndrome: holding home gabapentin  and duloxetine . Will restart when renal function improves  Diet: Renal, no fluid restriction VTE: Eliquis  Code: Full  Dispo: Anticipated discharge to Skilled nursing facility pending medical stability.   Jayson Michael, MD PGY-1 Internal Medicine Resident Pager Number [phone] Please contact the on call pager after 5 pm and on weekends at [phone].

## 2023-12-07 NOTE — Progress Notes (Signed)
CVAD removed per protocol per MD order. Manual pressure applied for 15 mins. Vaseline gauze, gauze, and Tegaderm applied over insertion site. No bleeding or swelling noted. Instructed patient to remain in bed for thirty mins. Educated patient about S/S of infection and when to call MD; keep dressing dry and intact for 24 hours. Pt verbalized comprehension.

## 2023-12-07 NOTE — Progress Notes (Signed)
 Nephrology Follow-Up Consult note   Assessment/Recommendations: [name] is a/an 43 y.o. male with a past medical history significant for HIV and bipolar 2, admitted for acute rhabdomyolysis related to methamphetamine use and immobility.       AKI secondary to rhabdo: Baseline creatinine normal.  Creatinine continues to rise.  No uremic symptoms.  Finally with some downtrending creatinine -last HD on 4/22 - Feel that patient's dialysis catheter can be removed - Creatinine will likely continue to improve with time -Hopefully can sign off in 1 to 2 days if creatinine continues to improve -Continue to monitor daily Cr, Dose meds for GFR -Monitor Daily I/Os, Daily weight  -Maintain MAP>65 for optimal renal perfusion.  -Avoid nephrotoxic medications including NSAIDs -Use synthetic opioids (Fentanyl /Dilaudid ) if needed  Bilateral lower extremity DVT: Anticoagulation per primary team  Rhabdomyolysis: related to methamphetamine use and immobility.  Off IV hydration because of volume overload.  Oral hydration is sufficient.  No volume restriction  Hyperphosphatemia: Continue sevelamer .  Should improve his kidney function improves  Hyponatremia: Mild associated with renal failure.  Continue to monitor  HIV: well controlled on his meds   Recommendations conveyed to primary service.    Levorn Reason [facility] 12/07/2023 9:03 AM  ___________________________________________________________  CC: Leg pain  Interval History/Subjective: Patient continues to feel well today.  Creatinine essentially stable possibly slightly improved.  Great urine output continues.   Medications:  Current Facility-Administered Medications  Medication Dose Route Frequency Provider Last Rate Last Admin   acetaminophen  (TYLENOL ) tablet 650 mg  650 mg Oral Q8H Jayson Michael, MD   650 mg at 12/06/23 2211   albuterol  (PROVENTIL ) (2.5 MG/3ML) 0.083% nebulizer solution 2.5 mg  2.5 mg  Nebulization Q2H PRN Khan, Ghalib, MD   2.5 mg at 11/25/23 2307   apixaban  (ELIQUIS ) tablet 5 mg  5 mg Oral BID Jayson Michael, MD   5 mg at 12/06/23 2212   bictegravir-emtricitabine -tenofovir  AF (BIKTARVY ) 50-200-25 MG per tablet 1 tablet  1 tablet Oral Daily Jayson Michael, MD   1 tablet at 12/06/23 0740   bisacodyl  (DULCOLAX) EC tablet 5 mg  5 mg Oral Daily PRN Khan, Ghalib, MD   5 mg at 12/04/23 2216   Chlorhexidine  Gluconate Cloth 2 % PADS 6 each  6 each Topical Q0600 Charley Constable, MD   6 each at 12/06/23 1019   HYDROmorphone  (DILAUDID ) injection 0.5 mg  0.5 mg Intravenous Q2H PRN Khan, Ghalib, MD   0.5 mg at 11/26/23 1552   HYDROmorphone  (DILAUDID ) tablet 1 mg  1 mg Oral Q4H PRN Khan, Ghalib, MD   1 mg at 11/25/23 2029   nitroGLYCERIN  (NITROSTAT ) SL tablet 0.4 mg  0.4 mg Sublingual Q5 min PRN Sherol Dixie, MD   0.4 mg at 11/25/23 2300   ondansetron  (ZOFRAN ) injection 4 mg  4 mg Intravenous Q8H PRN Dorthy Gavia, MD       Oral care mouth rinse  15 mL Mouth Rinse PRN Cherylene Corrente, MD       polyethylene glycol (MIRALAX  / GLYCOLAX ) packet 17 g  17 g Oral Daily Jayson Michael, MD   17 g at 12/05/23 0930   sevelamer  carbonate (RENVELA ) tablet 1,600 mg  1,600 mg Oral TID WC Jackolyn Masker, MD   1,600 mg at 12/07/23 0981      Review of Systems: 10 systems reviewed and negative except per interval history/subjective  Physical Exam: Vitals:   12/06/23 2024 12/07/23 0222  BP: (!) 141/86 136/79  Pulse: Aaron Aas)  111 (!) 103  Resp: 17 18  Temp: 98.2 F (36.8 C) 98.3 F (36.8 C)  SpO2: 98% 98%   No intake/output data recorded.  Intake/Output Summary (Last 24 hours) at 12/07/2023 1610 Last data filed at 12/07/2023 0600 Gross per 24 hour  Intake 1880 ml  Output 3075 ml  Net -1195 ml   Constitutional: well-appearing, no acute distress ENMT: ears and nose without scars or lesions, MMM CV: normal rate, trace edema Respiratory: Bilateral chest rise, normal work of  breathing Gastrointestinal: soft, non-tender, no palpable masses or hernias Skin: no visible lesions or rashes Psych: alert, judgement/insight appropriate, appropriate mood and affect   Test Results I personally reviewed new and old clinical labs and radiology tests Lab Results  Component Value Date   NA 134 (L) 12/07/2023   K 3.5 12/07/2023   CL 97 (L) 12/07/2023   CO2 23 12/07/2023   BUN 95 (H) 12/07/2023   CREATININE 8.55 (H) 12/07/2023   CALCIUM  8.9 12/07/2023   ALBUMIN  3.0 (L) 12/07/2023   PHOS 6.9 (H) 12/07/2023    CBC Recent Labs  Lab 12/04/23 0510 12/06/23 0811 12/07/23 0232  WBC 12.5* 8.1 7.4  HGB 11.2* 10.6* 10.9*  HCT 33.2* 31.5* 31.7*  MCV 87.8 88.7 89.3  PLT 314 389 403*

## 2023-12-07 NOTE — Progress Notes (Signed)
 Mobility Specialist Progress Note:    12/07/23 1000  Mobility  Activity Ambulated with assistance in hallway  Level of Assistance Contact guard assist, steadying assist  Assistive Device Front wheel walker  Distance Ambulated (ft) 100 ft  Activity Response Tolerated well  Mobility Referral Yes  Mobility visit 1 Mobility  Mobility Specialist Start Time (ACUTE ONLY) [id number]  Mobility Specialist Stop Time (ACUTE ONLY) 0841  Mobility Specialist Time Calculation (min) (ACUTE ONLY) 9 min   Pt received in bed and agreeable. C/o R calf pain and stiffness. Asymptomatic throughout ambulation. Pt left in bed with call bell and all needs met.  D'Vante Nolon Baxter Mobility Specialist Please contact via Special educational needs teacher or Rehab office at [phone]

## 2023-12-08 DIAGNOSIS — R109 Unspecified abdominal pain: Secondary | ICD-10-CM

## 2023-12-08 DIAGNOSIS — N179 Acute kidney failure, unspecified: Secondary | ICD-10-CM | POA: Diagnosis not present

## 2023-12-08 DIAGNOSIS — R600 Localized edema: Secondary | ICD-10-CM | POA: Diagnosis not present

## 2023-12-08 LAB — RENAL FUNCTION PANEL
Albumin: 3.4 g/dL — ABNORMAL LOW (ref 3.5–5.0)
Anion gap: 16 — ABNORMAL HIGH (ref 5–15)
BUN: 87 mg/dL — ABNORMAL HIGH (ref 6–20)
CO2: 20 mmol/L — ABNORMAL LOW (ref 22–32)
Calcium: 9.6 mg/dL (ref 8.9–10.3)
Chloride: 98 mmol/L (ref 98–111)
Creatinine, Ser: 7.32 mg/dL — ABNORMAL HIGH (ref 0.61–1.24)
GFR, Estimated: 9 mL/min — ABNORMAL LOW (ref >60)
Glucose, Bld: 100 mg/dL — ABNORMAL HIGH (ref 70–99)
Phosphorus: 7.4 mg/dL — ABNORMAL HIGH (ref 2.5–4.6)
Potassium: 3.9 mmol/L (ref 3.5–5.1)
Sodium: 134 mmol/L — ABNORMAL LOW (ref 135–145)

## 2023-12-08 NOTE — Progress Notes (Signed)
 Mobility Specialist Progress Note:    12/08/23 1000  Mobility  Activity Ambulated with assistance in hallway  Level of Assistance Standby assist, set-up cues, supervision of patient - no hands on  Assistive Device Front wheel walker  Distance Ambulated (ft) 100 ft  Activity Response Tolerated well  Mobility Referral Yes  Mobility visit 1 Mobility  Mobility Specialist Start Time (ACUTE ONLY) [id number]  Mobility Specialist Stop Time (ACUTE ONLY) 0844  Mobility Specialist Time Calculation (min) (ACUTE ONLY) 11 min   Pt received in bed and agreeable. C/o still having R calf pain. Otherwise asymptomatic throughout ambulation. Performed R ankle ROM to stretch calf muscles at EOS. Pt left in bed with call bell and all needs met.  Kenneth Hunt Mobility Specialist Please contact via Special educational needs teacher or Rehab office at [phone]

## 2023-12-08 NOTE — Progress Notes (Signed)
 Nephrology Follow-Up Consult note   Assessment/Recommendations: Kenneth Hunt is a/an 43 y.o. male with a past medical history significant for HIV and bipolar 2, admitted for acute rhabdomyolysis related to methamphetamine use and immobility.       AKI secondary to rhabdo: Baseline creatinine normal.  Creatinine continues to rise.  No uremic symptoms.  Finally with some downtrending creatinine -last HD on 4/22 - Patient's temporary dialysis catheter removed on 4/26 - We think there is a very high likelihood that the patient's creatinine will continue to improve -Follow-up labs from today -Hopefully can sign off in 1 to 2 days if creatinine continues to improve -Continue to monitor daily Cr, Dose meds for GFR -Monitor Daily I/Os, Daily weight  -Maintain MAP>65 for optimal renal perfusion.  -Avoid nephrotoxic medications including NSAIDs -Use synthetic opioids (Fentanyl /Dilaudid ) if needed  Bilateral lower extremity DVT: Anticoagulation per primary team  Rhabdomyolysis: related to methamphetamine use and immobility.  Off IV hydration because of volume overload.  Oral hydration is sufficient.  No volume restriction  Hyperphosphatemia: Continue sevelamer .  Should improve his kidney function improves  Hyponatremia: Mild associated with renal failure.  Continue to monitor  HIV: well controlled on his meds   Recommendations conveyed to primary service.    Levorn Reason [facility] 12/08/2023 8:28 AM  ___________________________________________________________  CC: Leg pain  Interval History/Subjective: Continues to have some leg tightness particularly on the right side.  Ambulating throughout the day.  Good urine output.  Labs not returned yet   Medications:  Current Facility-Administered Medications  Medication Dose Route Frequency Provider Last Rate Last Admin   acetaminophen  (TYLENOL ) tablet 650 mg  650 mg Oral Q8H Jayson Michael, MD   650 mg at  12/08/23 0546   albuterol  (PROVENTIL ) (2.5 MG/3ML) 0.083% nebulizer solution 2.5 mg  2.5 mg Nebulization Q2H PRN Khan, Ghalib, MD   2.5 mg at 11/25/23 2307   apixaban  (ELIQUIS ) tablet 5 mg  5 mg Oral BID Jayson Michael, MD   5 mg at 12/07/23 2215   bictegravir-emtricitabine -tenofovir  AF (BIKTARVY ) 50-200-25 MG per tablet 1 tablet  1 tablet Oral Daily Jayson Michael, MD   1 tablet at 12/07/23 1017   bisacodyl  (DULCOLAX) EC tablet 5 mg  5 mg Oral Daily PRN Khan, Ghalib, MD   5 mg at 12/04/23 2216   HYDROmorphone  (DILAUDID ) injection 0.5 mg  0.5 mg Intravenous Q2H PRN Khan, Ghalib, MD   0.5 mg at 11/26/23 1552   HYDROmorphone  (DILAUDID ) tablet 1 mg  1 mg Oral Q4H PRN Khan, Ghalib, MD   1 mg at 11/25/23 2029   nitroGLYCERIN  (NITROSTAT ) SL tablet 0.4 mg  0.4 mg Sublingual Q5 min PRN Sherol Dixie, MD   0.4 mg at 11/25/23 2300   ondansetron  (ZOFRAN ) injection 4 mg  4 mg Intravenous Q8H PRN Dorthy Gavia, MD       Oral care mouth rinse  15 mL Mouth Rinse PRN Cherylene Corrente, MD       polyethylene glycol (MIRALAX  / GLYCOLAX ) packet 17 g  17 g Oral Daily Jayson Michael, MD   17 g at 12/05/23 0930   sevelamer  carbonate (RENVELA ) tablet 1,600 mg  1,600 mg Oral TID WC Jackolyn Masker, MD   1,600 mg at 12/08/23 0815      Review of Systems: 10 systems reviewed and negative except per interval history/subjective  Physical Exam: Vitals:   12/08/23 0542 12/08/23 0818  BP: (!) 144/78 135/67  Pulse: 98 (!) 107  Resp:  Temp: 98.1 F (36.7 C) (!) 97.4 F (36.3 C)  SpO2: 95% 97%   Total I/O In: -  Out: 400 [Urine:400]  Intake/Output Summary (Last 24 hours) at 12/08/2023 1610 Last data filed at 12/08/2023 9604 Gross per 24 hour  Intake 1080 ml  Output 3100 ml  Net -2020 ml   Constitutional: well-appearing, no acute distress ENMT: ears and nose without scars or lesions, MMM CV: normal rate, no edema Respiratory: Bilateral chest rise, normal work of breathing Gastrointestinal: soft, non-tender,  no palpable masses or hernias Skin: no visible lesions or rashes Psych: alert, judgement/insight appropriate, appropriate mood and affect   Test Results I personally reviewed new and old clinical labs and radiology tests Lab Results  Component Value Date   NA 134 (L) 12/07/2023   K 3.5 12/07/2023   CL 97 (L) 12/07/2023   CO2 23 12/07/2023   BUN 95 (H) 12/07/2023   CREATININE 8.55 (H) 12/07/2023   CALCIUM  8.9 12/07/2023   ALBUMIN  3.0 (L) 12/07/2023   PHOS 6.9 (H) 12/07/2023    CBC Recent Labs  Lab 12/04/23 0510 12/06/23 0811 12/07/23 0232  WBC 12.5* 8.1 7.4  HGB 11.2* 10.6* 10.9*  HCT 33.2* 31.5* 31.7*  MCV 87.8 88.7 89.3  PLT 314 389 403*

## 2023-12-08 NOTE — Progress Notes (Signed)
                  Subjective:   Summary: 43 yo M with PMH significant for well-controlled HIV, bipolar 2, presenting with acute renal failure 2/2 rhabdomyolysis and acute bilateral DVTs in the setting of methamphetamine use and prolonged immobility.  Hospital Day 14  Patient reports his legs still feel tight, R calf tightness is improved. He feels well hydrated. No irritation or pain at previous temp cath site.   Objective:  Vital signs in last 24 hours: Vitals:   12/07/23 0222 12/07/23 1629 12/07/23 2101 12/08/23 0542  BP: 136/79 (!) 128/59 131/60 (!) 144/78  Pulse: (!) 103 100 (!) 106 98  Resp: 18 19 19    Temp: 98.3 F (36.8 C)  98.7 F (37.1 C) 98.1 F (36.7 C)  TempSrc: Oral  Oral Oral  SpO2: 98% 95% 98% 95%  Weight:      Height:       Supplemental O2: Room Air SpO2: 95 % O2 Flow Rate (L/min): 2 L/min  Physical Exam:  Constitutional: well appearing, no acute distress Pulm: Normal work of breathing, decreased lung sounds at lung bases.  Ext: mild LE edema. Warm and well-perfused. R foot: 1/5 dorsiflexion , 5/5 plantar flexion. Neuro: A&O x 3  Assessment/Plan:   Acute renal failure 2/2 rhabdomyolysis Creatinine continues downtrend, 8.55 --> 7.32. BUN improving, 95--> 87 yesterday. Overall kidney function improving. Temp cath was removed yesterday. He continues to put out good urine, nearly 2.4L. No tachycardia overnight, and patient denies dehydration. Per nephrology Cr may continue to improve overtime. - Trend RFP - Strict I/Os  Acute bilateral LE deep vein thromboses R foot drop, suspect peroneal nerve injury Continue Eliquis  5mg  BID. Blx LE edema and foot drop with mild improvement today. PT following, possibility of STAR program pending. He continues to ambulate with support, he is using orthotic while in bed to help with foot drop occasionally.   Sinus tachycardia Improved, HR  90s-100 overnight. Continuing PO fluid intake. He now seems adequately hydrated with  Hrs imrpoved.  Anion gap metabolic acidosis Bicarb 20, anion gap 16. Though his Cr and BUN continue to improve with as well as his electrolyte derangements, this could be a sign of worsening renal function. Will continue to monitor.   Hyponatremia Hyperkalemia Hyperphosphatemia Hypocalcemia Various electrolyte derangements have been stable. Calcium  continues to improve along with albumin . Phosphorous also improving. Renal filtration is still poor, but improving. No indication for repletion based on today's values.  - Trend RFP - Sevelamer  carbonate 1600mg  TID  HIV, well-controlled: continue home Biktarvy  Elevated liver enzymes: was downtrending, acute elevation was likely 2/2 rhabdo Leukocytosis: Improved now wnl. Will no longer trend CBC Bipolar 2 disorder: takes long-acting injectable Aristada q2 months, last taken around 3/23 Constipation: Miralax  17g daily, bisacodyl  5mg  prn Thoracic outlet syndrome: holding home gabapentin  and duloxetine . Will restart when renal function improves  Diet: Renal, no fluid restriction VTE: Eliquis  Code: Full  Dispo: Anticipated discharge to Skilled nursing facility pending medical stability.    I have seen and examined the patient myself, and I have reviewed the note by Alfornia Imam, MS3 and was present during the interview and physical exam.    Signed: Jayson Michael, MD Internal Medicine Resident, PGY-1

## 2023-12-09 DIAGNOSIS — N179 Acute kidney failure, unspecified: Secondary | ICD-10-CM | POA: Diagnosis not present

## 2023-12-09 DIAGNOSIS — R600 Localized edema: Secondary | ICD-10-CM | POA: Diagnosis not present

## 2023-12-09 LAB — RENAL FUNCTION PANEL
Albumin: 3.4 g/dL — ABNORMAL LOW (ref 3.5–5.0)
Anion gap: 14 (ref 5–15)
BUN: 86 mg/dL — ABNORMAL HIGH (ref 6–20)
CO2: 20 mmol/L — ABNORMAL LOW (ref 22–32)
Calcium: 9.8 mg/dL (ref 8.9–10.3)
Chloride: 101 mmol/L (ref 98–111)
Creatinine, Ser: 6.62 mg/dL — ABNORMAL HIGH (ref 0.61–1.24)
GFR, Estimated: 10 mL/min — ABNORMAL LOW (ref >60)
Glucose, Bld: 82 mg/dL (ref 70–99)
Phosphorus: 6.7 mg/dL — ABNORMAL HIGH (ref 2.5–4.6)
Potassium: 3.6 mmol/L (ref 3.5–5.1)
Sodium: 135 mmol/L (ref 135–145)

## 2023-12-09 NOTE — Progress Notes (Signed)
 Nephrology Follow-Up progress note   Assessment/Recommendations: Kenneth Hunt is a/an 43 y.o. male with a past medical history significant for HIV and bipolar 2, admitted for acute rhabdomyolysis related to methamphetamine use and immobility.     AKI secondary to rhabdo: Baseline creatinine normal.  Creatinine continues to trend in the right direction w/ last HD on 4/22 - Patient's temporary dialysis catheter removed on 4/26 - We think there is a very high likelihood that the patient's creatinine will continue to improve -will sign off at this time -> 2 week f/u with labs (we will set up).  Will stop the binders after his doses on Tuesday.  Pharmacy notified.  -Continue to monitor daily Cr, Dose meds for GFR -Monitor Daily I/Os, Daily weight  -Maintain MAP>65 for optimal renal perfusion.  -Avoid nephrotoxic medications including NSAIDs -Use synthetic opioids (Fentanyl /Dilaudid ) if needed  Bilateral lower extremity DVT: Anticoagulation per primary team. Pain in the right leg.  Rhabdomyolysis: related to methamphetamine use and immobility.  Off IV hydration because of volume overload.  Oral hydration is sufficient.  No volume restriction  Hyperphosphatemia: Continue sevelamer  now including doses on 4/29; may discontinue after that given kidney function improvement  Hyponatremia: Mild associated with renal failure.  Continue to monitor  HIV: well controlled on his meds   Recommendations conveyed to primary service.    Patrick Boor [facility] 12/09/2023 9:13 AM  ___________________________________________________________  CC: Leg pain  Interval History/Subjective: Continues to have some right leg tightness.  Ambulating throughout the day.  Great urine output.     Medications:  Current Facility-Administered Medications  Medication Dose Route Frequency Provider Last Rate Last Admin   acetaminophen  (TYLENOL ) tablet 650 mg  650 mg Oral Q8H Jayson Michael, MD    650 mg at 12/09/23 1610   albuterol  (PROVENTIL ) (2.5 MG/3ML) 0.083% nebulizer solution 2.5 mg  2.5 mg Nebulization Q2H PRN Khan, Ghalib, MD   2.5 mg at 11/25/23 2307   apixaban  (ELIQUIS ) tablet 5 mg  5 mg Oral BID Jayson Michael, MD   5 mg at 12/09/23 9604   bictegravir-emtricitabine -tenofovir  AF (BIKTARVY ) 50-200-25 MG per tablet 1 tablet  1 tablet Oral Daily Jayson Michael, MD   1 tablet at 12/09/23 5409   bisacodyl  (DULCOLAX) EC tablet 5 mg  5 mg Oral Daily PRN Khan, Ghalib, MD   5 mg at 12/04/23 2216   HYDROmorphone  (DILAUDID ) injection 0.5 mg  0.5 mg Intravenous Q2H PRN Khan, Ghalib, MD   0.5 mg at 11/26/23 1552   HYDROmorphone  (DILAUDID ) tablet 1 mg  1 mg Oral Q4H PRN Khan, Ghalib, MD   1 mg at 11/25/23 2029   nitroGLYCERIN  (NITROSTAT ) SL tablet 0.4 mg  0.4 mg Sublingual Q5 min PRN Sherol Dixie, MD   0.4 mg at 11/25/23 2300   ondansetron  (ZOFRAN ) injection 4 mg  4 mg Intravenous Q8H PRN Dorthy Gavia, MD       Oral care mouth rinse  15 mL Mouth Rinse PRN Cherylene Corrente, MD       polyethylene glycol (MIRALAX  / GLYCOLAX ) packet 17 g  17 g Oral Daily Jayson Michael, MD   17 g at 12/05/23 0930   sevelamer  carbonate (RENVELA ) tablet 1,600 mg  1,600 mg Oral TID WC Jackolyn Masker, MD   1,600 mg at 12/09/23 8119      Review of Systems: 10 systems reviewed and negative except per interval history/subjective  Physical Exam: Vitals:   12/09/23 0615 12/09/23 0726  BP: 124/75 (!) 130/94  Pulse: 94 (!) 109  Resp: 18 18  Temp: 98.1 F (36.7 C) 98 F (36.7 C)  SpO2: 98% 98%   No intake/output data recorded.  Intake/Output Summary (Last 24 hours) at 12/09/2023 0913 Last data filed at 12/09/2023 0600 Gross per 24 hour  Intake 1020 ml  Output 2425 ml  Net -1405 ml   Constitutional: well-appearing, no acute distress ENMT: ears and nose without scars or lesions, MMM CV: normal rate, no edema Respiratory: Bilateral chest rise, normal work of breathing Gastrointestinal: soft, non-tender,  no palpable masses or hernias Skin: no visible lesions or rashes Psych: alert, judgement/insight appropriate, appropriate mood and affect    Test Results I personally reviewed new and old clinical labs and radiology tests Lab Results  Component Value Date   NA 135 12/09/2023   K 3.6 12/09/2023   CL 101 12/09/2023   CO2 20 (L) 12/09/2023   BUN 86 (H) 12/09/2023   CREATININE 6.62 (H) 12/09/2023   CALCIUM  9.8 12/09/2023   ALBUMIN  3.4 (L) 12/09/2023   PHOS 6.7 (H) 12/09/2023    CBC Recent Labs  Lab 12/04/23 0510 12/06/23 0811 12/07/23 0232  WBC 12.5* 8.1 7.4  HGB 11.2* 10.6* 10.9*  HCT 33.2* 31.5* 31.7*  MCV 87.8 88.7 89.3  PLT [phone]*

## 2023-12-09 NOTE — Plan of Care (Signed)
  Problem: Health Behavior/Discharge Planning: Goal: Ability to manage health-related needs will improve Outcome: Progressing   Problem: Clinical Measurements: Goal: Will remain free from infection Outcome: Progressing Goal: Diagnostic test results will improve Outcome: Progressing Goal: Respiratory complications will improve Outcome: Progressing Goal: Cardiovascular complication will be avoided Outcome: Progressing   Problem: Activity: Goal: Risk for activity intolerance will decrease Outcome: Progressing   Problem: Nutrition: Goal: Adequate nutrition will be maintained Outcome: Progressing   Problem: Coping: Goal: Level of anxiety will decrease Outcome: Progressing   Problem: Elimination: Goal: Will not experience complications related to bowel motility Outcome: Progressing Goal: Will not experience complications related to urinary retention Outcome: Progressing   Problem: Pain Managment: Goal: General experience of comfort will improve and/or be controlled Outcome: Progressing   Problem: Safety: Goal: Ability to remain free from injury will improve Outcome: Progressing   Problem: Skin Integrity: Goal: Risk for impaired skin integrity will decrease Outcome: Progressing

## 2023-12-09 NOTE — Progress Notes (Signed)
                  Subjective:   Summary: 43 yo M with PMH significant for well-controlled HIV, bipolar 2, presenting with acute renal failure 2/2 rhabdomyolysis and acute bilateral DVTs in the setting of methamphetamine use and prolonged immobility.  Hospital Day 15  Patient reports b/l legs symptoms are stable and he continues to ambulate w/ daily improvements. He was worried about his probation officer visiting him soon and is concerned about being home in time. He reports living with a roommate and that he is "done" with drug use w/ no bad influences at home. He feels ready to go home.  Objective:  Vital signs in last 24 hours: Vitals:   12/08/23 0542 12/08/23 0818 12/08/23 1624 12/08/23 2000  BP: (!) 144/78 135/67 137/79 129/73  Pulse: 98 (!) 107 96 99  Resp:      Temp: 98.1 F (36.7 C) (!) 97.4 F (36.3 C) 98.2 F (36.8 C) 98.2 F (36.8 C)  TempSrc: Oral Oral Oral Oral  SpO2: 95% 97% 98% 96%  Weight:      Height:       Supplemental O2: Room Air SpO2: 96 % O2 Flow Rate (L/min): 2 L/min  Physical Exam:  Constitutional: well appearing, no acute distress Cardio: Tachycardic, regular rhythm, no murmurs Ext: 1+ RLE edema. No L LE edema. LE warm and well-perfused without erythema. R foot: 1/5 dorsiflexion. Neuro: A&O x 3  Assessment/Plan:   Acute renal failure 2/2 rhabdomyolysis Improving. Creatinine continues downtrend, 7.32-->6.62. BUN stable. Overall kidney function improving. He continues to put out good urine.  - Trend RFP - Strict I/Os - Nephrology outpatient follow up in 2 weeks  Acute bilateral LE deep vein thromboses R foot drop, suspect peroneal nerve injury Blx LE edema and foot drop stable. PT following, possibility of STAR program pending to rehabilitate until he perform ADLs and iADLs on his own. He continues to ambulate more with support, he is using orthotic while in bed to help with foot drop occasionally. Recommend he use an orthotic while ambulating,  as well.  - Continue Eliquis  5mg  BID  Anion gap metabolic acidosis Bicarb stable at 20, without elevated anion gap. Not severe enough for repletion at this time. Will continue to monitor.   Hyponatremia Hyperkalemia Hyperphosphatemia Hypocalcemia Various electrolyte derangements continue to improve. Phosphorous remains elevated at 7.4-->6.7.  Renal filtration is still poor. No indication for repletion based on today's values.  - Trend RFP - Sevelamer  carbonate 1600mg  TID, may discontinue after Tuesday with phosphorous improving.  Sinus tachycardia Improved. HR 90s-100s overnight. Continuing PO fluid intake and continues to deny dehydration.  HIV, well-controlled: continue home Biktarvy  Elevated liver enzymes: was downtrending, acute elevation was likely 2/2 rhabdo Leukocytosis: Resolved. Was likely elevated in the setting of acute inflammation. Bipolar 2 disorder: takes long-acting injectable Aristada q2 months, last taken around 3/23 Constipation: Miralax  17g, bisacodyl  5mg  PRN Thoracic outlet syndrome: holding home gabapentin  and duloxetine . Will restart when renal function improves  Diet: Renal, no fluid restriction VTE: Eliquis  Code: Full  Dispo: Anticipated discharge to home pending medical stability.     I have seen and examined the patient myself, and I have reviewed the note by Alfornia Imam, MS3 and was present during the interview and physical exam.    Signed: Lorelle Roll, DO Internal Medicine Resident, PGY-3

## 2023-12-09 NOTE — Progress Notes (Signed)
 Mobility Specialist Progress Note:   12/09/23 1000  Mobility  Activity Ambulated with assistance in hallway  Level of Assistance Standby assist, set-up cues, supervision of patient - no hands on  Assistive Device Front wheel walker  Distance Ambulated (ft) 110 ft  Activity Response Tolerated well  Mobility Referral Yes  Mobility visit 1 Mobility  Mobility Specialist Start Time (ACUTE ONLY) 0900  Mobility Specialist Stop Time (ACUTE ONLY) 0910  Mobility Specialist Time Calculation (min) (ACUTE ONLY) 10 min   Received pt in bed having no complaints and agreeable to mobility. Pt was asymptomatic throughout ambulation and returned to room w/o fault. Left in bed w/ call bell in reach and all needs met.   D'Vante Nolon Baxter Mobility Specialist Please contact via Special educational needs teacher or Rehab office at [phone]

## 2023-12-09 NOTE — Progress Notes (Signed)
 Physical Therapy Treatment Patient Details Name: Kenneth Hunt MRN: [id number] DOB: [birth year] Today's Date: 12/09/2023   History of Present Illness Kenneth Hunt is a 43 year old male who is presenting with several days of lower extremity swelling/pain and oliguria.   PMH: bipolar disorder, HIV, anxiety, depression, thoracic outlet syndrome, IV drug use (methamphetamine), most recent use 3 days ago    PT Comments  Patient progressing with mobility, strength and safety.  Still with signs of decreased activity tolerance with HR up to 145 with ambulation and stairs.  Able to use cane safely in hallway and will have roommate to assist with home entry.  Note he prefers HHPT initially with transportation issues, though will benefit from transition to outpatient PT once able to get SCAT set up.  Patient stable for home with recommendations as noted.  PT will follow up if not d/c.     If plan is discharge home, recommend the following: A little help with walking and/or transfers;A little help with bathing/dressing/bathroom;Assistance with cooking/housework;Assist for transportation;Help with stairs or ramp for entrance   Can travel by private vehicle     Yes  Equipment Recommendations  Cane;BSC/3in1    Recommendations for Other Services       Precautions / Restrictions Precautions Precautions: Fall Recall of Precautions/Restrictions: Intact     Mobility  Bed Mobility Overal bed mobility: Modified Independent       Supine to sit: Modified independent (Device/Increase time)          Transfers   Equipment used: Rolling walker (2 wheels), None, Straight cane Transfers: Sit to/from Stand Sit to Stand: Modified independent (Device/Increase time)           General transfer comment: stable with sit to stand    Ambulation/Gait Ambulation/Gait assistance: Supervision Gait Distance (Feet): 200 Feet (x2) Assistive device: Rolling walker (2 wheels), None, Straight  cane Gait Pattern/deviations: Step-to pattern, Steppage, Wide base of support, Decreased stride length, Decreased dorsiflexion - right       General Gait Details: steppage with R despite using toe lifter strap; improved clearance when toe lifter moved further to distal part of foot; started with RW, then without device noting some imbalance with DGI activities, so assisted obtained cane and pt used with S with good technique   Stairs Stairs: Yes Stairs assistance: Min assist Stair Management: One rail Right, No rails, Step to pattern, Forwards Number of Stairs: 2 General stair comments: with HHA initially to step up, pt fearful with stepping down and choosing to use rail in addition to HHA then with rail only; discussed using cane with roomate assistance for safety   Wheelchair Mobility     Tilt Bed    Modified Rankin (Stroke Patients Only)       Balance Overall balance assessment: Needs assistance Sitting-balance support: Feet supported Sitting balance-Leahy Scale: Good       Standing balance-Leahy Scale: Good Standing balance comment: walking without device though some imbalance with dynamic gait activities                 Standardized Balance Assessment Standardized Balance Assessment : Dynamic Gait Index   Dynamic Gait Index Level Surface: Mild Impairment Change in Gait Speed: Moderate Impairment Gait with Horizontal Head Turns: Mild Impairment Gait with Vertical Head Turns: Mild Impairment Gait and Pivot Turn: Mild Impairment Step Over Obstacle: Severe Impairment Step Around Obstacles: Mild Impairment Steps: Severe Impairment Total Score: 11      Communication    Cognition Arousal: Alert  Behavior During Therapy: WFL for tasks assessed/performed   PT - Cognitive impairments: No apparent impairments                         Following commands: Intact      Cueing    Exercises Other Exercises Other Exercises: performed heel cord  stretch with instructions using gait belt 3 x 20 sec hold    General Comments General comments (skin integrity, edema, etc.): max HR 145; education on use of toe lifter velcro strap and need for orthotist referral for AFO if numbness and weakness do not get better as increases his fall risk      Pertinent Vitals/Pain Pain Assessment Pain Assessment: Faces Faces Pain Scale: Hurts a little bit Pain Location: RLE Pain Descriptors / Indicators: Tightness Pain Intervention(s): Monitored during session    Home Living                          Prior Function            PT Goals (current goals can now be found in the care plan section) Progress towards PT goals: Progressing toward goals    Frequency    Min 2X/week      PT Plan      Co-evaluation              AM-PAC PT "6 Clicks" Mobility   Outcome Measure  Help needed turning from your back to your side while in a flat bed without using bedrails?: None Help needed moving from lying on your back to sitting on the side of a flat bed without using bedrails?: None Help needed moving to and from a bed to a chair (including a wheelchair)?: None Help needed standing up from a chair using your arms (e.g., wheelchair or bedside chair)?: None Help needed to walk in hospital room?: A Little Help needed climbing 3-5 steps with a railing? : A Little 6 Click Score: 22    End of Session Equipment Utilized During Treatment: Gait belt Activity Tolerance: Patient tolerated treatment well Patient left: in bed;with call bell/phone within reach   PT Visit Diagnosis: Muscle weakness (generalized) (M62.81);Other abnormalities of gait and mobility (R26.89)     Time: 1410-1450 PT Time Calculation (min) (ACUTE ONLY): 40 min  Charges:    $Gait Training: 23-37 mins $Self Care/Home Management: 8-22 PT General Charges $$ ACUTE PT VISIT: 1 Visit                     Abigail Hoff, PT Acute Rehabilitation  Services Office:[phone] 12/09/2023    Marley Simmers 12/09/2023, 2:59 PM

## 2023-12-10 ENCOUNTER — Other Ambulatory Visit (HOSPITAL_COMMUNITY): Payer: Self-pay

## 2023-12-10 DIAGNOSIS — M7989 Other specified soft tissue disorders: Secondary | ICD-10-CM | POA: Diagnosis not present

## 2023-12-10 DIAGNOSIS — N179 Acute kidney failure, unspecified: Secondary | ICD-10-CM | POA: Diagnosis not present

## 2023-12-10 DIAGNOSIS — R600 Localized edema: Secondary | ICD-10-CM | POA: Diagnosis not present

## 2023-12-10 DIAGNOSIS — I82403 Acute embolism and thrombosis of unspecified deep veins of lower extremity, bilateral: Secondary | ICD-10-CM | POA: Diagnosis not present

## 2023-12-10 LAB — RENAL FUNCTION PANEL
Albumin: 3.3 g/dL — ABNORMAL LOW (ref 3.5–5.0)
Anion gap: 11 (ref 5–15)
BUN: 85 mg/dL — ABNORMAL HIGH (ref 6–20)
CO2: 19 mmol/L — ABNORMAL LOW (ref 22–32)
Calcium: 10 mg/dL (ref 8.9–10.3)
Chloride: 107 mmol/L (ref 98–111)
Creatinine, Ser: 5.91 mg/dL — ABNORMAL HIGH (ref 0.61–1.24)
GFR, Estimated: 11 mL/min — ABNORMAL LOW (ref >60)
Glucose, Bld: 105 mg/dL — ABNORMAL HIGH (ref 70–99)
Phosphorus: 6.5 mg/dL — ABNORMAL HIGH (ref 2.5–4.6)
Potassium: 3.7 mmol/L (ref 3.5–5.1)
Sodium: 137 mmol/L (ref 135–145)

## 2023-12-10 MED ORDER — APIXABAN 5 MG PO TABS
5.0000 mg | ORAL_TABLET | Freq: Two times a day (BID) | ORAL | 0 refills | Status: DC
  Filled 2023-12-10: qty 60, 30d supply, fill #0

## 2023-12-10 NOTE — Progress Notes (Signed)
 DISCHARGE NOTE HOME Kenneth Hunt to be discharged Home per MD order. Discussed prescriptions and follow up appointments with the patient. Prescriptions given to patient; medication list explained in detail. Patient verbalized understanding.  Skin clean, dry and intact without evidence of skin break down, no evidence of skin tears noted. IV catheter discontinued intact. Site without signs and symptoms of complications. Dressing and pressure applied. Pt denies pain at the site currently. No complaints noted.  Patient free of lines, drains, and wounds.   An After Visit Summary (AVS) was printed and given to the patient. Patient escorted via wheelchair, and discharged home via private auto.  Elvina Hammers, RN

## 2023-12-10 NOTE — Plan of Care (Signed)
  Problem: Health Behavior/Discharge Planning: Goal: Ability to manage health-related needs will improve Outcome: Progressing   Problem: Clinical Measurements: Goal: Will remain free from infection Outcome: Progressing Goal: Diagnostic test results will improve Outcome: Progressing Goal: Respiratory complications will improve Outcome: Progressing Goal: Cardiovascular complication will be avoided Outcome: Progressing   Problem: Activity: Goal: Risk for activity intolerance will decrease Outcome: Progressing   Problem: Nutrition: Goal: Adequate nutrition will be maintained Outcome: Progressing   Problem: Coping: Goal: Level of anxiety will decrease Outcome: Progressing   Problem: Elimination: Goal: Will not experience complications related to bowel motility Outcome: Progressing Goal: Will not experience complications related to urinary retention Outcome: Progressing   Problem: Pain Managment: Goal: General experience of comfort will improve and/or be controlled Outcome: Progressing   Problem: Safety: Goal: Ability to remain free from injury will improve Outcome: Progressing   Problem: Skin Integrity: Goal: Risk for impaired skin integrity will decrease Outcome: Progressing

## 2023-12-10 NOTE — Progress Notes (Addendum)
 Physical Therapy Treatment Patient Details Name: Kenneth Hunt MRN: [id number] DOB: 1981-02-23 Today's Date: 12/10/2023   History of Present Illness Kenneth Hunt is a 43 year old male who is presenting with several days of lower extremity swelling/pain and oliguria.   PMH: bipolar disorder, HIV, anxiety, depression, thoracic outlet syndrome, IV drug use (methamphetamine), most recent use 3 days ago    PT Comments  Pt received in bed, practiced R ankle stretching with gait belt and discussed proper positioning for maximal stretch. Pt anxious about R foot drop. Recommend R AFO which has not been delivered to room, recommending outpt PT for fitting of AFO and balance training. Pt used city bus for transport [place] but will not be able to tolerate the necessary distance to the bus stop with DVT's and foot drop. Will need medical transportation, eg [employer] [place]. Practiced ambulation with multiple AD. Recommend RW for home for most stability on days when pain is an issue. Pt to purchase [facility] on his own for days when he feels better. PT will continue to follow.    If plan is discharge home, recommend the following: A little help with walking and/or transfers;A little help with bathing/dressing/bathroom;Assistance with cooking/housework;Assist for transportation;Help with stairs or ramp for entrance   Can travel by private vehicle     Yes  Equipment Recommendations  Rolling walker (2 wheels)    Recommendations for Other Services       Precautions / Restrictions Precautions Precautions: Fall Recall of Precautions/Restrictions: Intact Precaution/Restrictions Comments: R foot drop Restrictions Weight Bearing Restrictions Per Provider Order: No     Mobility  Bed Mobility Overal bed mobility: Modified Independent Bed Mobility: Supine to Sit, Sit to Supine     Supine to sit: Modified independent (Device/Increase time) Sit to supine: Modified independent (Device/Increase time), HOB  elevated        Transfers Overall transfer level: Independent Equipment used: None, Straight cane Transfers: Sit to/from Stand Sit to Stand: Independent           General transfer comment: stable with and without AD    Ambulation/Gait Ambulation/Gait assistance: Modified independent (Device/Increase time) Gait Distance (Feet): 300 Feet Assistive device: Rolling walker (2 wheels), None, Straight cane Gait Pattern/deviations: Step-to pattern, Steppage, Wide base of support, Decreased stride length, Decreased dorsiflexion - right Gait velocity: decreased Gait velocity interpretation: <1.8 ft/sec, indicate of risk for recurrent falls   General Gait Details: ambulated with and without AD and with and without df strap. Recommend RW for home for safety. Pt to purchade [facility] on his own for days when he is feeling more steady   Stairs Stairs: Yes Stairs assistance: Supervision Stair Management: Step to pattern, Forwards, No rails Number of Stairs: 3 General stair comments: pt able to complete 3 steps with SPC. Will have roommate with him when he first goes up steps and then will order cane. Could also use RW folded up. vc's for sequencing   Wheelchair Mobility     Tilt Bed    Modified Rankin (Stroke Patients Only)       Balance Overall balance assessment: Mild deficits observed, not formally tested Sitting-balance support: Feet supported Sitting balance-Leahy Scale: Good     Standing balance support: During functional activity, Bilateral upper extremity supported, Reliant on assistive device for balance Standing balance-Leahy Scale: Good Standing balance comment: limitations in dynamic balance due to R foot drop  Communication Communication Communication: No apparent difficulties  Cognition Arousal: Alert Behavior During Therapy: Anxious   PT - Cognitive impairments: No apparent impairments                       PT  - Cognition Comments: anxious about L foot drop Following commands: Intact      Cueing Cueing Techniques: Verbal cues, Visual cues  Exercises Other Exercises Other Exercises: passive dorsiflexion RLE    General Comments General comments (skin integrity, edema, etc.): Rec outpt PT for AFO fitting and PT for balance. Pt was using bus for transportation [place] but will not be able to ambulate distance to bus stop with R foot drop and will need to use [employer] [place]. DIscussed this with CSM      Pertinent Vitals/Pain Pain Assessment Pain Assessment: Faces Faces Pain Scale: Hurts a little bit Pain Location: R calf Pain Descriptors / Indicators: Tightness, Throbbing Pain Intervention(s): Limited activity within patient's tolerance, Monitored during session    Home Living                          Prior Function            PT Goals (current goals can now be found in the care plan section) Acute Rehab PT Goals Patient Stated Goal: Tolerate standing and walk PT Goal Formulation: With patient Time For Goal Achievement: 12/10/23 Potential to Achieve Goals: Good Progress towards PT goals: Progressing toward goals    Frequency    Min 2X/week      PT Plan      Co-evaluation              AM-PAC PT "6 Clicks" Mobility   Outcome Measure  Help needed turning from your back to your side while in a flat bed without using bedrails?: None Help needed moving from lying on your back to sitting on the side of a flat bed without using bedrails?: None Help needed moving to and from a bed to a chair (including a wheelchair)?: None Help needed standing up from a chair using your arms (e.g., wheelchair or bedside chair)?: None Help needed to walk in hospital room?: A Little Help needed climbing 3-5 steps with a railing? : A Little 6 Click Score: 22    End of Session Equipment Utilized During Treatment: Gait belt Activity Tolerance: Patient tolerated treatment well Patient  left: in bed;with call bell/phone within reach Nurse Communication: Mobility status PT Visit Diagnosis: Muscle weakness (generalized) (M62.81);Other abnormalities of gait and mobility (R26.89) Pain - Right/Left: Right Pain - part of body: Leg     Time: 0102-7253 PT Time Calculation (min) (ACUTE ONLY): 31 min  Charges:    $Gait Training: 8-22 mins $Therapeutic Exercise: 8-22 mins PT General Charges $$ ACUTE PT VISIT: 1 Visit                     Amey Ka, PT  Acute Rehab Services Secure chat preferred Office [phone]    Deloris Fetters Antione Obar 12/10/2023, 11:29 AM

## 2023-12-10 NOTE — Discharge Summary (Signed)
 Name: Kenneth Hunt MRN: [id number] DOB: 10-Oct-1980 43 y.o. PCP: Kenneth Zameza, Priscila, MD  Date of Admission: 11/24/2023 12:16 PM Date of Discharge: 12/10/23 Attending Physician: Dr. 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 Commonly known as: CYMBALTA  TAKE 3 CAPSULES(90 MG) BY MOUTH DAILY       STOP taking these medications    gabapentin  400 MG capsule Commonly known as: NEURONTIN    hydrocortisone  cream 1 %   oxyCODONE  5 MG immediate release tablet Commonly known as: Oxy IR/ROXICODONE        TAKE these medications    apixaban  5 MG Tabs tablet Commonly known as: ELIQUIS  Take 1 tablet (5 mg total) by mouth 2 (two) times daily.   Aristada 1064 MG/3.9ML prefilled syringe Generic drug: ARIPiprazole Lauroxil ER Inject 1,064 mg into the muscle every 2 (two) months.   Biktarvy  50-200-25 MG Tabs tablet Generic drug: bictegravir-emtricitabine -tenofovir  AF Take 1 tablet by mouth daily.               Durable Medical Equipment  (From admission, onward)           Start      Ordered   12/10/23 1001  For home use only DME Cane  Once        12/10/23 1000   12/10/23 1000  For home use only DME Bedside commode  Once       Question:  Patient needs a bedside commode to treat with the following condition  Answer:  Foot drop   12/10/23 1000            Disposition and follow-up:   [name] was discharged from [facility] in Stable condition.  At the hospital follow up visit please address:  1.  Follow-up:  Acute renal failure 2/2 rhabdomyolysis: assess for urinary symptoms/oliguria. Get a follow up RFP. Duloxetine  was held due to ARF, can restart if renal function has improved and if patient desires   Acute bilateral lower extremity DVTs with R foot drop: check on progression of foot drop and if patient has been having HH PT. Consider orthopedics follow up. Ensure patient has been taking Eliquis  5mg  BID and doesn't have signs of acute bleeding.    Tachycardia: patient was persistently tachycardic, possibly due to dehydration vs deconditioning. Check vitals.   2.  Labs / imaging needed at time of follow-up: RFP  3.  Pending labs/ test needing follow-up: none  4.  Medication Changes  Started: Eliquis  5mg  BID  Stopped: duloxetine  due to ARF. Can  restart if indicated  Changed:  Abx -   End Date:  Follow-up Appointments: [facility] 5/6, 10:15am w/Dr [facility] Course by problem list:  Acute renal failure 2/2 rhabdomyolysis Patient presented with several days of lower extremity swelling and oliguria in the setting of IV meth use and prolonged immobilization. Upon arrival patient was found to have acute renal failure with creatinine 10.8 from baseline ~1.0, BUN 127, CK >50,000, AST/ALT 1500/688, K 6.5. Patient had a temp cath placed and received multiple sessions of HD per nephrology throughout the course of the admission. Patient began making a significant amount of urine, though kidney filtration was slow to improve.  Temp cath was removed on 4/26 as creatinine continued to downtrend. On 4/29 creatinine was still significantly elevated at 5.9, however patient was eager to be return home and was deemed stable to be discharged with PCP and nephrology follow up.   Acute bilateral lower extremity DVTs R foot drop, suspect peroneal nerve injury Patient was found to acute bilateral lower extremity DVTs in the setting of prolonged immobilization while lying with his legs folded back. On exam his legs were swollen, erythematous, warm with palpable pulses bilaterally. Eliquis  was initiated for DVTs. His legs were evaluated daily to ensure there was no compartment syndrome, legs continued to be warm and well-perfused. He additionally had a R foot drop with 0/5 strength in dorsiflexion, which showed little improvement by time of discharge. He was discharged with a DME cane and home health PT. Eliquis  5mg  BID was also started for DVT ppx, which he will likely need for an additional 6 months after discharge.   Anion gap metabolic acidosis Electrolyte derangements Patient initially had an AGMA requiring bicarb gtt, which was eventually discontinued by nephrology as his anion gap improved. He additionally had various electrolyte derangements including hyponatremia, hyperkalemia, hyperphosphatemia, and hypocalcemia consistent with acute renal failure. Patient received multiple sessions of HD and other repletion as needed. Electrolytes were within normal limits on day of discharge. Bicarb continued to be mildly low.   Sinus tachycardia HR was intermittently elevated to 90s-100s in sinus rhythm. PT noted elevations to 140s with ambulation. Unsure of etiology, possibly due to low volume status vs deconditioning. Recommend outpatient follow up.   HIV, well-controlled: continued home Biktarvy  Elevated liver enzymes: downtrending, acute elevation was likely 2/2 rhabdo Leukocytosis: Resolved. Was likely elevated in the setting of acute  inflammation. Bipolar 2 disorder: takes long-acting injectable Aristada q2 months, last taken around 3/23 Thoracic outlet syndrome: home duloxetine  was held due to ARF. Can restart if renal function continues to improve   Discharge Subjective: Patient was feeling well and is ready to be discharged today. No significant change in his foot drop or swelling, but feels he will be able to get into his apartment.   Discharge Exam:   BP (!) 138/98 (BP Location: Right Arm)   Pulse (!) 107   Temp 98 F (36.7 C) (Oral)   Resp 18   Ht 5\' 3"  (1.6 m)   Wt 94.6 kg   SpO2 98%   BMI 36.94 kg/m  Constitutional: well-appearing, in no acute distress Ext: bilateral lower extremity edema, improved from previous. 1/5 strength in R foot dorsiflexion Neuro: A&O x 3  Pertinent Labs, Studies, and Procedures:     Latest Ref Rng & Units 12/07/2023    2:32 AM 12/06/2023    8:11 AM 12/04/2023    5:10 AM  CBC  WBC 4.0 - 10.5 K/uL 7.4  8.1  12.5   Hemoglobin 13.0 - 17.0 g/dL 16.1  09.6  04.5   Hematocrit 39.0 - 52.0 % 31.7  31.5  33.2   Platelets 150 - 400 K/uL 403  389  314        Latest Ref Rng & Units 12/10/2023    4:41 AM 12/09/2023    4:54 AM 12/08/2023    7:50 AM  CMP  Glucose 70 - 99 mg/dL 409  82  811   BUN 6 - 20 mg/dL 85  86  87   Creatinine 0.61 - 1.24 mg/dL 9.14  7.82  9.56   Sodium 135 - 145 mmol/L 137  135  134   Potassium 3.5 - 5.1 mmol/L 3.7  3.6  3.9   Chloride 98 - 111 mmol/L 107  101  98   CO2 22 - 32 mmol/L 19  20  20    Calcium  8.9 - 10.3 mg/dL 21.3  9.8  9.6     DG CHEST PORT 1 VIEW Result Date: 11/25/2023 CLINICAL DATA:  Pleuritic chest pain and increasing oxygen requirement EXAM: PORTABLE CHEST 1 VIEW COMPARISON:  11/24/2023 FINDINGS: Cardiac shadow is within normal limits. The overall inspiratory effort is poor with crowding of the vascular markings. Mild central vascular congestion is seen as well as increasing left basilar atelectasis. No bony abnormality is noted. IMPRESSION:  Mild vascular congestion with increasing left basilar atelectasis. Electronically Signed   By: Violeta Grey M.D.   On: 11/25/2023 23:38   VAS US  LOWER EXTREMITY VENOUS (DVT) (ONLY [facility] & [facility]) Result Date: 11/25/2023  Lower Venous DVT Study Patient Name:  Kenneth Hunt  Date of Exam:   11/24/2023 Medical Rec #: [id number]            Accession #:    [id number] Date of Birth: 05-May-1981            Patient Gender: M Patient Age:   69 years Exam Location:  [facility] Procedure:      VAS US  LOWER EXTREMITY VENOUS (DVT) Referring Phys: Paris Bolds --------------------------------------------------------------------------------  Indications: Swelling and calf cramping, left > right, X 3 days.  Limitations: Tight swelling of calves and popliteal fossa. Comparison Study: No prior LEV on file Performing Technologist: Carleene Chase RVS  Examination Guidelines: A complete evaluation includes B-mode imaging, spectral Doppler, color Doppler, and power Doppler as needed of all accessible portions of each vessel. Bilateral testing is considered an integral part of a complete examination. Limited examinations for reoccurring indications may be performed as noted. The reflux portion of the exam is performed with the patient in reverse Trendelenburg.  +---------+---------------+---------+-----------+----------+-------------------+ RIGHT    CompressibilityPhasicitySpontaneityPropertiesThrombus Aging      +---------+---------------+---------+-----------+----------+-------------------+ CFV      Full           Yes      No                                       +---------+---------------+---------+-----------+----------+-------------------+ SFJ      Full                                                             +---------+---------------+---------+-----------+----------+-------------------+ FV Prox  Full  Yes      No                                        +---------+---------------+---------+-----------+----------+-------------------+ FV Mid   Full                                                             +---------+---------------+---------+-----------+----------+-------------------+ FV DistalFull                                                             +---------+---------------+---------+-----------+----------+-------------------+ PFV      Full           Yes      No                                       +---------+---------------+---------+-----------+----------+-------------------+ POP                                                   Not well visualized +---------+---------------+---------+-----------+----------+-------------------+ PTV      None                                         Acute               +---------+---------------+---------+-----------+----------+-------------------+ PERO     None                                         Acute               +---------+---------------+---------+-----------+----------+-------------------+   +---------+---------------+---------+-----------+----------+-------------------+ LEFT     CompressibilityPhasicitySpontaneityPropertiesThrombus Aging      +---------+---------------+---------+-----------+----------+-------------------+ CFV      Full           Yes      No                                       +---------+---------------+---------+-----------+----------+-------------------+ SFJ      Full                                                             +---------+---------------+---------+-----------+----------+-------------------+ FV Prox  Full                                                             +---------+---------------+---------+-----------+----------+-------------------+  FV Mid   Full           Yes      No                                       +---------+---------------+---------+-----------+----------+-------------------+ FV  DistalFull                                                             +---------+---------------+---------+-----------+----------+-------------------+ PFV      Full                                                             +---------+---------------+---------+-----------+----------+-------------------+ POP                                                   Not well visualized +---------+---------------+---------+-----------+----------+-------------------+ PTV      None                                         Acute               +---------+---------------+---------+-----------+----------+-------------------+ PERO     None                                         Acute               +---------+---------------+---------+-----------+----------+-------------------+     Summary: RIGHT: - Findings consistent with acute deep vein thrombosis involving the right posterior tibial veins, and right peroneal veins. Technically limited and difficult study secondary to tightness of calf and popliteal fossa.   LEFT: - Findings consistent with acute deep vein thrombosis involving the proximal to mid left posterior tibial veins, and left peroneal veins. Technically limited and difficult study secondary to tightness of calf and popliteal fossa.   *See table(s) above for measurements and observations. Electronically signed by Jimmye Moulds MD on 11/25/2023 at 12:59:29 PM.    Final    ECHOCARDIOGRAM COMPLETE Result Date: 11/25/2023    ECHOCARDIOGRAM REPORT   Patient Name:   Kenneth Hunt Date of Exam: 11/25/2023 Medical Rec #:  [id number]           Height:       63.0 in Accession #:    [id number]          Weight:       170.0 lb Date of Birth:  Jan 12, 1981           BSA:          1.805 m Patient Age:    42 years            BP:  115/77 mmHg Patient Gender: M                   HR:           85 bpm. Exam Location:  Inpatient Procedure: 2D Echo, Color Doppler and Cardiac Doppler (Both  Spectral and Color            Flow Doppler were utilized during procedure). Indications:    R94.31 Abnormal EKG  History:        Patient has no prior history of Echocardiogram examinations.                 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  Sonographer:    Sherline Distel Senior RDCS Referring Phys: Jackolyn Masker IMPRESSIONS  1. Left ventricular ejection fraction, by estimation, is 65 to 70%. Left ventricular ejection fraction by PLAX is 69 %. The left ventricle has normal function. The left ventricle has no regional wall motion abnormalities. Left ventricular diastolic parameters were normal.  2. Right ventricular systolic function is normal. The right ventricular size is normal. There is normal pulmonary artery systolic pressure. The estimated right ventricular systolic pressure is 34.8 mmHg.  3. The mitral valve is normal in structure. Mild mitral valve regurgitation.  4. The aortic valve has an indeterminant number of cusps. Aortic valve regurgitation is not visualized.  5. The inferior vena cava is normal in size with <50% respiratory variability, suggesting right atrial pressure of 8 mmHg. FINDINGS  Left Ventricle: Left ventricular ejection fraction, by estimation, is 65 to 70%. Left ventricular ejection fraction by PLAX is 69 %. The left ventricle has normal function. The left ventricle has no regional wall motion abnormalities. The left ventricular internal cavity size was normal in size. There is no left ventricular hypertrophy. Left ventricular diastolic parameters were normal. Right Ventricle: The right ventricular size is normal. No increase in right ventricular wall thickness. Right ventricular systolic function is normal. There is normal pulmonary artery systolic pressure. The tricuspid regurgitant velocity is 2.59 m/s, and  with an assumed right atrial pressure of 8 mmHg, the estimated right ventricular systolic pressure is 34.8 mmHg. Left Atrium: Left atrial size was normal in size. Right Atrium: Right atrial size was  normal in size. Pericardium: There is no evidence of pericardial effusion. Mitral Valve: The mitral valve is normal in structure. Mild mitral valve regurgitation. Tricuspid Valve: The tricuspid valve is normal in structure. Tricuspid valve regurgitation is trivial. Aortic Valve: The aortic valve has an indeterminant number of cusps. Aortic valve regurgitation is not visualized. Pulmonic Valve: The pulmonic valve was normal in structure. Pulmonic valve regurgitation is trivial. Aorta: The aortic root and ascending aorta are structurally normal, with no evidence of dilitation. Venous: The inferior vena cava is normal in size with less than 50% respiratory variability, suggesting right atrial pressure of 8 mmHg. IAS/Shunts: No atrial level shunt detected by color flow Doppler.  LEFT VENTRICLE PLAX 2D LV EF:         Left            Diastology                ventricular     LV e' medial:    11.60 cm/s                ejection        LV E/e' medial:  7.5                fraction by  LV e' lateral:   15.30 cm/s                PLAX is 69      LV E/e' lateral: 5.7                %. LVIDd:         4.40 cm LVIDs:         2.70 cm LV PW:         1.00 cm LV IVS:        1.00 cm LVOT diam:     1.90 cm LV SV:         63 LV SV Index:   35 LVOT Area:     2.84 cm  RIGHT VENTRICLE RV S prime:     14.10 cm/s TAPSE (M-mode): 1.9 cm LEFT ATRIUM             Index        RIGHT ATRIUM           Index LA diam:        3.30 cm 1.83 cm/m   RA Area:     12.20 cm LA Vol (A2C):   48.8 ml 27.04 ml/m  RA Volume:   24.40 ml  13.52 ml/m LA Vol (A4C):   42.9 ml 23.77 ml/m LA Biplane Vol: 47.9 ml 26.54 ml/m  AORTIC VALVE LVOT Vmax:   115.00 cm/s LVOT Vmean:  86.500 cm/s LVOT VTI:    0.221 m  AORTA Ao Root diam: 3.50 cm Ao Asc diam:  3.10 cm MITRAL VALVE               TRICUSPID VALVE MV Area (PHT): 4.36 cm    TR Peak grad:   26.8 mmHg MV Decel Time: 174 msec    TR Vmax:        259.00 cm/s MV E velocity: 86.50 cm/s MV A velocity: 58.70 cm/s  SHUNTS  MV E/A ratio:  1.47        Systemic VTI:  0.22 m                            Systemic Diam: 1.90 cm Jules Oar MD Electronically signed by Jules Oar MD Signature Date/Time: 11/25/2023/11:17:58 AM    Final    US  RENAL Result Date: 11/25/2023 CLINICAL DATA:  Acute renal failure EXAM: RENAL / URINARY TRACT ULTRASOUND COMPLETE COMPARISON:  Abdominal CT from 1 day prior FINDINGS: Right Kidney: Renal measurements: 13 x 6 x 6 cm = volume: 250 mL. Increased cortical echogenicity. No hydronephrosis or mass. Left Kidney: Renal measurements: 13 x 6.5 x 6 cm with symmetric volume. Increased cortical echogenicity with prominent corticomedullary differentiation. No hydronephrosis or mass. Bladder: Collapsed IMPRESSION: Medical renal disease with normal if not enlarged renal size. Electronically Signed   By: Ronnette Coke M.D.   On: 11/25/2023 04:38   US  Abdomen Limited RUQ (LIVER/GB) Result Date: 11/24/2023 CLINICAL DATA:  Elevated LFTs. EXAM: ULTRASOUND ABDOMEN LIMITED RIGHT UPPER QUADRANT COMPARISON:  CT abdomen and pelvis 11/24/2023 FINDINGS: Gallbladder: No gallstones or wall thickening visualized. No sonographic Murphy sign noted by sonographer. Common bile duct: Diameter: 4.3 mm Liver: No focal lesion identified. Within normal limits in parenchymal echogenicity. Portal vein is patent on color Doppler imaging with normal direction of blood flow towards the liver. Other: None. IMPRESSION: Normal right upper quadrant ultrasound. Electronically Signed   By: Rollen Clines.D.  On: 11/24/2023 21:14   CT EXTREMITY LOWER RIGHT WO CONTRAST Result Date: 11/24/2023 CLINICAL DATA:  Right leg swelling.  Concern for abscess. EXAM: CT OF THE LOWER RIGHT EXTREMITY WITHOUT CONTRAST TECHNIQUE: Multidetector CT imaging of the right lower extremity was performed according to the standard protocol. RADIATION DOSE REDUCTION: This exam was performed according to the departmental dose-optimization program which includes  automated exposure control, adjustment of the mA and/or kV according to patient size and/or use of iterative reconstruction technique. COMPARISON:  None Available. FINDINGS: Bones/Joint/Cartilage Choose 1.  No bone destruction. Ligaments Suboptimally assessed by CT. Muscles and Tendons Negative Soft tissues Edema throughout the subcutaneous soft tissues of the right leg. No focal fluid collection to suggest abscess. Small nodules in the subcutaneous soft tissues of the right buttock, likely injection granulomata. IMPRESSION: Edema throughout the subcutaneous soft tissues of the right lower extremity. No focal fluid collection to suggest abscess. No acute bony abnormality. Electronically Signed   By: Janeece Mechanic M.D.   On: 11/24/2023 18:07   CT ABDOMEN PELVIS WO CONTRAST Result Date: 11/24/2023 CLINICAL DATA:  Abdominal pain. Leg pain. Acute renal injury. No IV contrast administered. EXAM: CT ABDOMEN AND PELVIS WITHOUT CONTRAST TECHNIQUE: Multidetector CT imaging of the abdomen and pelvis was performed following the standard protocol without IV contrast. RADIATION DOSE REDUCTION: This exam was performed according to the departmental dose-optimization program which includes automated exposure control, adjustment of the mA and/or kV according to patient size and/or use of iterative reconstruction technique. COMPARISON:  None Available. FINDINGS: Lower chest: Lung bases are clear. Hepatobiliary: No focal hepatic lesion. Normal gallbladder. No biliary duct dilatation. Common bile duct is normal. Pancreas: Pancreas is normal. No ductal dilatation. No pancreatic inflammation. Spleen: Normal spleen Adrenals/urinary tract: Adrenal glands normal. There is mild renal edema on the RIGHT. LEFT kidney appears normal. No hydronephrosis. There is no obstructing calculi. Stomach/Bowel: Stomach, small bowel, appendix, and cecum are normal. The colon and rectosigmoid colon are normal. Vascular/Lymphatic: Abdominal aorta is normal  caliber. No periportal or retroperitoneal adenopathy. No pelvic adenopathy. Reproductive: Unremarkable Other: None Musculoskeletal: No aggressive osseous lesion. Injection granuloma in the flanks over the buttocks. IMPRESSION: 1. Mild RIGHT renal edema. No hydronephrosis. No obstructing calculi. Indeterminate finding. Recommend clinical correlation for renal infection. 2. Normal LEFT kidney. 3. Normal appendix. Electronically Signed   By: Deboraha Fallow M.D.   On: 11/24/2023 17:40   DG Chest Portable 1 View Result Date: 11/24/2023 CLINICAL DATA:  I edema, tachycardia EXAM: PORTABLE CHEST 1 VIEW COMPARISON:  None Available. FINDINGS: Normal cardiac silhouette. Subtle airspace density in the RIGHT lower lobe. Relatively low lung volumes. No pneumothorax. No acute osseous abnormality. IMPRESSION: Low lung volumes. Atelectasis versus infiltrate at the RIGHT lung base. Electronically Signed   By: Deboraha Fallow M.D.   On: 11/24/2023 13:47      Signed: Jayson Michael, MD PGY-1 12/10/2023, 11:14 AM   Pager: [phone]

## 2023-12-10 NOTE — TOC Transition Note (Addendum)
 Transition of Care [facility]) - Discharge Note   Patient Details  Name: Kenneth Hunt MRN: [id number] Date of Birth: 02-27-1981  Transition of Care [facility]) CM/SW Contact:  Tom-Johnson, Angelique Ken, RN Phone Number: 12/10/2023, 12:12 PM   Clinical Narrative:     Patient is scheduled for discharge today.  Readmission Risk Assessment done. Outpatient PT referral, hospital f/u and discharge instructions on AVS. Prescriptions sent to [facility] pharmacy and patient will receive meds prior discharge. RW ordered from [employer] and will be delivered to patient's home. [employer] [place] application sent via Email to Courtney Rorie, awaiting response. Patient will receive a call from them if approved.  Roommate to transport at discharge.  No further TOC needs noted.       Final next level of care: OP Rehab Barriers to Discharge: Barriers Resolved   Patient Goals and CMS Choice Patient states their goals for this hospitalization and ongoing recovery are:: To return home CMS Medicare.gov Compare Post Acute Care list provided to:: Patient Choice offered to / list presented to : Patient      Discharge Placement                Patient to be transferred to facility by: Roommate      Discharge Plan and Services Additional resources added to the After Visit Summary for   In-house Referral: Clinical Social Work              DME Arranged: Otho Blitz rolling DME Agency: [employer] Date DME Agency Contacted: 12/10/23 Time DME Agency Contacted: 1125 Representative spoke with at DME Agency: Raechel Bulla HH Arranged: NA HH Agency: NA        Social Drivers of Health (SDOH) Interventions SDOH Screenings   Food Insecurity: No Food Insecurity (11/25/2023)  Housing: Low Risk  (11/25/2023)  Transportation Needs: No Transportation Needs (11/25/2023)  Utilities: Not At Risk (11/25/2023)  Alcohol Screen: Low Risk  (10/09/2022)  Depression (PHQ2-9): Low Risk  (09/26/2023)  Financial Resource Strain: Low Risk   (10/09/2022)  Physical Activity: Inactive (10/09/2022)  Social Connections: Not on File (05/04/2023)   Received from [facility]  Stress: Stress Concern Present (10/09/2022)  Tobacco Use: Low Risk  (11/27/2023)     Readmission Risk Interventions    12/10/2023   11:30 AM  Readmission Risk Prevention Plan  Transportation Screening Complete  PCP or Specialist Appt within 5-7 Days Complete  Home Care Screening Complete  Medication Review (RN CM) Referral to Pharmacy

## 2023-12-10 NOTE — Progress Notes (Signed)
 Occupational Therapy Treatment Patient Details Name: Kenneth Hunt MRN: [id number] DOB: December 10, 1980 Today's Date: 12/10/2023   History of present illness Kenneth Hunt is a 43 year old male who is presenting with several days of lower extremity swelling/pain and oliguria.   PMH: bipolar disorder, HIV, anxiety, depression, thoracic outlet syndrome, IV drug use (methamphetamine), most recent use 3 days ago   OT comments  Pt making excellent progress towards OT goals. Pt able to manage LB dressing, tub transfer simulation and hallway mobility without assistance. Trial with RW progressing to no AD for mobility on level surfaces though minor impacts still noted by R foot drop. Emphasis on exercises for R ankle and calf, as well as elevation of RLE when resting to decrease swelling. Anticipate no OT needs at DC. Defer mobility DME recs to PT after their session today.       If plan is discharge home, recommend the following:  Help with stairs or ramp for entrance;Assist for transportation   Equipment Recommendations  Other (comment) (RW vs cane - defer to PT)    Recommendations for Other Services      Precautions / Restrictions Precautions Precautions: Fall Recall of Precautions/Restrictions: Intact Precaution/Restrictions Comments: R foot drop Restrictions Weight Bearing Restrictions Per Provider Order: No       Mobility Bed Mobility Overal bed mobility: Modified Independent                  Transfers Overall transfer level: Independent Equipment used: Rolling walker (2 wheels), None   Sit to Stand: Modified independent (Device/Increase time), Independent                 Balance Overall balance assessment: No apparent balance deficits (not formally assessed)                                         ADL either performed or assessed with clinical judgement   ADL Overall ADL's : Modified independent;Needs assistance/impaired                      Lower Body Dressing: Modified independent;Sit to/from stand;Sitting/lateral leans Lower Body Dressing Details (indicate cue type and reason): able to don socks w/o isses         Tub/ Shower Transfer: Supervision/safety;Tub transfer Tub/Shower Transfer Details (indicate cue type and reason): simulation and discussion of tub transfer w/pt reports he has something he is able to hold onto Functional mobility during ADLs: Supervision/safety General ADL Comments: Discussed ADL mgmt, DME considerations and various stretches for RLE. pt denies need for [facility] over toilet, able to manage this at home. Does have concerns for stair mgmt with PT notified    Extremity/Trunk Assessment Upper Extremity Assessment Upper Extremity Assessment: Overall WFL for tasks assessed;Right hand dominant   Lower Extremity Assessment Lower Extremity Assessment: Defer to PT evaluation        Vision   Vision Assessment?: No apparent visual deficits   Perception     Praxis     Communication Communication Communication: No apparent difficulties   Cognition Arousal: Alert Behavior During Therapy: WFL for tasks assessed/performed Cognition: No apparent impairments                               Following commands: Intact        Cueing   Cueing  Techniques: Verbal cues, Visual cues  Exercises Exercises: Other exercises Other Exercises Other Exercises: passive dorsiflexion RLE Other Exercises: standing toe raises with RW    Shoulder Instructions       General Comments      Pertinent Vitals/ Pain       Pain Assessment Pain Assessment: Faces Faces Pain Scale: Hurts a little bit Pain Location: R calf Pain Descriptors / Indicators: Tightness, Throbbing Pain Intervention(s): Monitored during session  Home Living                                          Prior Functioning/Environment              Frequency  Min 2X/week        Progress Toward  Goals  OT Goals(current goals can now be found in the care plan section)  Progress towards OT goals: Progressing toward goals  Acute Rehab OT Goals Patient Stated Goal: go home soon, recover RLE function OT Goal Formulation: With patient Time For Goal Achievement: 12/10/23 Potential to Achieve Goals: Good ADL Goals Pt Will Perform Lower Body Dressing: with contact guard assist;sitting/lateral leans;sit to/from stand Pt Will Transfer to Toilet: with contact guard assist;ambulating Pt Will Perform Toileting - Clothing Manipulation and hygiene: with contact guard assist;sit to/from stand  Plan      Co-evaluation                 AM-PAC OT "6 Clicks" Daily Activity     Outcome Measure   Help from another person eating meals?: None Help from another person taking care of personal grooming?: None Help from another person toileting, which includes using toliet, bedpan, or urinal?: None Help from another person bathing (including washing, rinsing, drying)?: None Help from another person to put on and taking off regular upper body clothing?: None Help from another person to put on and taking off regular lower body clothing?: None 6 Click Score: 24    End of Session Equipment Utilized During Treatment: Rolling walker (2 wheels)  OT Visit Diagnosis: Unsteadiness on feet (R26.81);Muscle weakness (generalized) (M62.81);Pain Pain - part of body: Leg;Ankle and joints of foot   Activity Tolerance Patient tolerated treatment well   Patient Left in bed;with call bell/phone within reach   Nurse Communication Mobility status        Time: 1610-9604 OT Time Calculation (min): 29 min  Charges: OT General Charges $OT Visit: 1 Visit OT Treatments $Self Care/Home Management : 8-22 mins $Therapeutic Activity: 8-22 mins  Kenneth Hunt, OTR/L Acute Rehab Services Office: [phone]   Kenneth Hunt 12/10/2023, 9:56 AM

## 2023-12-11 DIAGNOSIS — M7989 Other specified soft tissue disorders: Secondary | ICD-10-CM

## 2023-12-11 HISTORY — DX: Other specified soft tissue disorders: M79.89

## 2023-12-13 ENCOUNTER — Other Ambulatory Visit (HOSPITAL_COMMUNITY): Payer: Self-pay

## 2023-12-17 ENCOUNTER — Ambulatory Visit (INDEPENDENT_AMBULATORY_CARE_PROVIDER_SITE_OTHER): Payer: Medicare (Managed Care) | Admitting: Student

## 2023-12-17 VITALS — BP 124/86 | HR 100 | Temp 97.7°F | Ht 63.0 in | Wt 189.7 lb

## 2023-12-17 DIAGNOSIS — N179 Acute kidney failure, unspecified: Secondary | ICD-10-CM | POA: Diagnosis not present

## 2023-12-17 DIAGNOSIS — T796XXD Traumatic ischemia of muscle, subsequent encounter: Secondary | ICD-10-CM

## 2023-12-17 DIAGNOSIS — F419 Anxiety disorder, unspecified: Secondary | ICD-10-CM | POA: Diagnosis not present

## 2023-12-17 DIAGNOSIS — Z21 Asymptomatic human immunodeficiency virus [HIV] infection status: Secondary | ICD-10-CM | POA: Diagnosis not present

## 2023-12-17 DIAGNOSIS — G5731 Lesion of lateral popliteal nerve, right lower limb: Secondary | ICD-10-CM | POA: Diagnosis not present

## 2023-12-17 DIAGNOSIS — M6282 Rhabdomyolysis: Secondary | ICD-10-CM

## 2023-12-17 DIAGNOSIS — I824Z3 Acute embolism and thrombosis of unspecified deep veins of distal lower extremity, bilateral: Secondary | ICD-10-CM | POA: Diagnosis not present

## 2023-12-17 HISTORY — DX: Rhabdomyolysis: M62.82

## 2023-12-17 NOTE — Patient Instructions (Signed)
 Thank you, [name] for allowing us  to provide your care today.   I have ordered the following labs for you:   Lab Orders         Renal function panel       Follow up:  1 month      Remember:   To do physical therapy Call your counselor, let us  know if you need us  to set you up an appt.  Follow up with nephro.    Should you have any questions or concerns please call the internal medicine clinic at [phone].     Jose Ngo, MD [facility]

## 2023-12-17 NOTE — Assessment & Plan Note (Signed)
 Patient coming in to follow-up from recent hospital where he was found down for an unknown period of time.  IV amphetamines and was with his legs bent towards the back.  He was found to have normal lower DVTs and was started on Eliquis .  He has been tolerating this well and reports no bleeding.  -Continue Eliquis  for at least 3 more months.

## 2023-12-17 NOTE — Assessment & Plan Note (Signed)
 Patient was admitted from 11/24/2023 to 12/10/2023 due to rhabdomyolysis secondary to having been stuck after IV injection of methamphetamines.  His creatinine at baseline is around 1 but he was found to have a creatinine of 10.8 and a BUN of 127.  CK was over 50,000.  Nephrology was consulted and he was given hemodialysis while inpatient.  He was discharged with a creatinine of around 5.  He is producing peculiar urine at this point and has continued to improve slowly.  He is coming in to follow-up.  We will check an RFP.  He will see nephrology later this week.  -FU with Nephrology  -RFP today

## 2023-12-17 NOTE — Progress Notes (Signed)
 CC:  Chief Complaint  Patient presents with   Hospitalization Follow-up   Medication Refill   Cough   HPI:  [name] is a 43 y.o. male living with a history stated below and presents today for HFU. Please see problem based assessment and plan for additional details.  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 MRI brain and cervical spine showed bilateral neural foraminal cysts C4-C5, C5-C6, C6-C7, brain within normal limits.  - continue cymbalta , flexeril, neurontin  - f/u with Dr. Marden [facility] neurology 03/28/19   Depression    History of syphilis 12/30/2017   RPR 1:1 12/25/17 s/p adequate treatment   HIV (human immunodeficiency virus infection) (HCC)    IV drug abuse (HCC)    TOS (thoracic outlet syndrome)     Current Outpatient Medications on File Prior to Visit  Medication Sig Dispense Refill   apixaban  (ELIQUIS ) 5 MG TABS tablet Take 1 tablet (5 mg total) by mouth 2 (two) times daily. 60 tablet 0   ARISTADA 1064 MG/3.9ML prefilled syringe Inject 1,064 mg into the muscle every 2 (two) months.     bictegravir-emtricitabine -tenofovir  AF (BIKTARVY ) 50-200-25 MG TABS tablet Take 1 tablet by mouth daily. 30 tablet 11   [Paused] DULoxetine  (CYMBALTA ) 30 MG capsule TAKE 3 CAPSULES(90 MG) BY MOUTH DAILY 90 capsule 2   No current facility-administered medications on file prior to visit.    Family History  Problem Relation Age of Onset   COPD Mother     Social History   Socioeconomic History   Marital status: Single    Spouse name: Not on file   Number of children: Not on file   Years of education: Not on file   Highest education level: Not on file  Occupational History   Not on file  Tobacco Use   Smoking status: Never   Smokeless tobacco: Never  Vaping Use   Vaping status: Never Used  Substance and Sexual Activity    Alcohol use: Not Currently   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, Methamphetamines    Comment: last used  Jan 05 2023 ( 1 month ago per pt on 02-05-2023)   Sexual activity: Yes    Partners: Male    Comment: declined condoms  Other Topics Concern   Not on file  Social History Narrative   Not on file   Social Drivers of Health   Financial Resource Strain: Low Risk  (10/09/2022)   Overall Financial Resource Strain (CARDIA)    Difficulty of Paying Living Expenses: Not very hard  Food Insecurity: No Food Insecurity (11/25/2023)   Hunger Vital Sign    Worried About Running Out of Food in the Last Year: Never true    Ran Out of Food in the Last Year: Never true  Transportation Needs: No Transportation Needs (11/25/2023)   PRAPARE - Administrator, Civil Service (Medical): No    Lack of Transportation (Non-Medical): No  Physical Activity: Inactive (10/09/2022)   Exercise Vital Sign    Days of Exercise per Week: 0 days    Minutes of Exercise per Session: 0 min  Stress: Stress Concern Present (10/09/2022)   [employer] of Occupational Health - Occupational Stress Questionnaire    Feeling of Stress : Rather much  Social Connections: Not on File (05/04/2023)   Received  from [facility]   Social Connections    Connectedness: 0  Intimate Partner Violence: Not At Risk (11/25/2023)   Humiliation, Afraid, Rape, and Kick questionnaire    Fear of Current or Ex-Partner: No    Emotionally Abused: No    Physically Abused: No    Sexually Abused: No   Review of Systems: ROS negative except for what is noted on the assessment and plan.  Vitals:   12/17/23 0956  BP: 124/86  Pulse: 100  Temp: 97.7 F (36.5 C)  TempSrc: Oral  Weight: 189 lb 11.2 oz (86 kg)  Height: 5\' 3"  (1.6 m)    Physical Exam: Constitutional: well-appearing, in NAD  HENT: normocephalic atraumatic, mucous membranes moist, tympanic membranes are non-bulging with normal light reflex. Scarring present bilaterally.  Non erythematous, non-purulent and no petechia on the oropharynx. No maxillary sinus tenderness.  Eyes: conjunctiva non-erythematous Cardiovascular: regular rate and rhythm, no m/r/g Pulmonary/Chest: normal work of breathing on room air, lungs clear to auscultation bilaterally Abdominal: soft, non-tender, non-distended MSK: normal bulk and tone Neurological: alert & oriented x 3, pt cannot dorsiflex right foot, can wiggle toes, otherwise neurologically intact. Skin: warm and dry, well healed skin popping scars in the right lower leg. Thready TP and DP pulses on the right. No erythema or signs of infection. Psych: normal mood and behavior  Assessment & Plan:   Patient discussed with Dr.  Lelia Putnam  DVT, lower extremity, distal, acute, bilateral [facility]) Patient coming in to follow-up from recent hospital where he was found down for an unknown period of time.  IV amphetamines and was with his legs bent towards the back.  He was found to have normal lower DVTs and was started on Eliquis .  He has been tolerating this well and reports no bleeding.  -Continue Eliquis  for at least 3 more months.  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  He has had a history of this before but it got worse after he had the incident where he was stuck with his knees bent backwards after using IV amphetamines.  His BLEs were severely edematous at the time that he presented to the hospital, his creatinine was 10.8 and BUN of 127.  CK was greatly increased and he was admitted for rhabdomyolysis.  During the course of his hospitalization his leg started improving and becoming less edematous.  He remained with his inability to dorsiflex his right foot.  He has scheduled PT for next week.  He was given a cane and a walker.  He is not with a cane or walker today. I reinforced the importance of using this as he is at high risk for fall and is currently on  anticoagulation.  This could predispose him for a bleed if he is to hit his head.  Patient understands and will be using his assistive devices for walking.  I have also reviewed several exercises that he can do at home to help him stimulate his right foot.  He will have PT next week.  -PT  -home exercises, inverting, everting, dorsiflexion, plantar flexion of right foot. Heel stretching.  Anxiety Pt is very anxious because of several stressors that have been going on at home. He is tense and looks scared. He also has a significant methamphetamine abuse that has caused great disruption in his life. He has been clean and remains clean of amphetamines since his admission. He wants to quit. He has a Veterinary surgeon and will  reach out to them so he can get video visits for support. I have also offered him counseling services through [facility] should he want to, and will reach out if unsuccessful to reach his prior counselor.   Asymptomatic HIV infection, CD4 >=500 (HCC) MSM. IVDU Well controlled on Biktarvy . Absolute CD4 count was 824 two months ago. Follows with [facility].   -cw Biktarvy   Rhabdomyolysis Patient was admitted from 11/24/2023 to 12/10/2023 due to rhabdomyolysis secondary to having been stuck after IV injection of methamphetamines.  His creatinine at baseline is around 1 but he was found to have a creatinine of 10.8 and a BUN of 127.  CK was over 50,000.  Nephrology was consulted and he was given hemodialysis while inpatient.  He was discharged with a creatinine of around 5.  He is producing peculiar urine at this point and has continued to improve slowly.  He is coming in to follow-up.  We will check an RFP.  He will see nephrology later this week.  -FU with Nephrology  -RFP today  Jose Ngo, MD [facility] Internal Medicine, PGY-1 Phone: [phone] Date 12/17/2023 Time 11:11 AM

## 2023-12-17 NOTE — Assessment & Plan Note (Signed)
 Pt is very anxious because of several stressors that have been going on at home. He is tense and looks scared. He also has a significant methamphetamine abuse that has caused great disruption in his life. He has been clean and remains clean of amphetamines since his admission. He wants to quit. He has a Veterinary surgeon and will reach out to them so he can get video visits for support. I have also offered him counseling services through [facility] should he want to, and will reach out if unsuccessful to reach his prior counselor.

## 2023-12-17 NOTE — Assessment & Plan Note (Addendum)
 Patient is unable to dorsiflex his right leg.  He has had a history of this before but it got worse after he had the incident where he was stuck with his knees bent backwards after using IV amphetamines.  His BLEs were severely edematous at the time that he presented to the hospital, his creatinine was 10.8 and BUN of 127.  CK was greatly increased and he was admitted for rhabdomyolysis.  During the course of his hospitalization his leg started improving and becoming less edematous.  He remained with his inability to dorsiflex his right foot.  He has scheduled PT for next week.  He was given a cane and a walker.  He is not with a cane or walker today. I reinforced the importance of using this as he is at high risk for fall and is currently on anticoagulation.  This could predispose him for a bleed if he is to hit his head.  Patient understands and will be using his assistive devices for walking.  I have also reviewed several exercises that he can do at home to help him stimulate his right foot.  He will have PT next week.  -PT  -home exercises, inverting, everting, dorsiflexion, plantar flexion of right foot. Heel stretching.

## 2023-12-17 NOTE — Assessment & Plan Note (Signed)
 MSM. IVDU Well controlled on Biktarvy . Absolute CD4 count was 824 two months ago. Follows with [facility].   -cw Biktarvy 

## 2023-12-17 NOTE — Progress Notes (Deleted)
 CC: ***  HPI:  [name] is a 43 y.o. male living with a history stated below and presents today for ***. Please see problem based assessment and plan for additional details.  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 MRI brain and cervical spine showed bilateral neural foraminal cysts C4-C5, C5-C6, C6-C7, brain within normal limits.  - continue cymbalta , flexeril, neurontin  - f/u with Dr. Marden Hunt [facility] neurology 03/28/19   Depression    History of syphilis 12/30/2017   RPR 1:1 12/25/17 s/p adequate treatment   HIV (human immunodeficiency virus infection) (HCC)    IV drug abuse (HCC)    TOS (thoracic outlet syndrome)     Current Outpatient Medications on File Prior to Visit  Medication Sig Dispense Refill   apixaban  (ELIQUIS ) 5 MG TABS tablet Take 1 tablet (5 mg total) by mouth 2 (two) times daily. 60 tablet 0   ARISTADA 1064 MG/3.9ML prefilled syringe Inject 1,064 mg into the muscle every 2 (two) months.     bictegravir-emtricitabine -tenofovir  AF (BIKTARVY ) 50-200-25 MG TABS tablet Take 1 tablet by mouth daily. 30 tablet 11   [Paused] DULoxetine  (CYMBALTA ) 30 MG capsule TAKE 3 CAPSULES(90 MG) BY MOUTH DAILY 90 capsule 2   No current facility-administered medications on file prior to visit.    Family History  Problem Relation Age of Onset   COPD Mother     Social History   Socioeconomic History   Marital status: Single    Spouse name: Not on file   Number of children: Not on file   Years of education: Not on file   Highest education level: Not on file  Occupational History   Not on file  Tobacco Use   Smoking status: Never   Smokeless tobacco: Never  Vaping Use   Vaping status: Never Used  Substance and Sexual Activity   Alcohol use: Not Currently   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, Methamphetamines     Comment: last used  Jan 05 2023 ( 1 month ago per pt on 02-05-2023)   Sexual activity: Yes    Partners: Male    Comment: declined condoms  Other Topics Concern   Not on file  Social History Narrative   Not on file   Social Drivers of Health   Financial Resource Strain: Low Risk  (10/09/2022)   Overall Financial Resource Strain (CARDIA)    Difficulty of Paying Living Expenses: Not very hard  Food Insecurity: No Food Insecurity (11/25/2023)   Hunger Vital Sign    Worried About Running Out of Food in the Last Year: Never true    Ran Out of Food in the Last Year: Never true  Transportation Needs: No Transportation Needs (11/25/2023)   PRAPARE - Administrator, Civil Service (Medical): No    Lack of Transportation (Non-Medical): No  Physical Activity: Inactive (10/09/2022)   Exercise Vital Sign    Days of Exercise per Week: 0 days    Minutes of Exercise per Session: 0 min  Stress: Stress Concern Present (10/09/2022)   [employer] of Occupational Health - Occupational Stress Questionnaire    Feeling of Stress : Rather much  Social Connections: Not on File (05/04/2023)   Received from [facility]   Social Connections    Connectedness: 0  Intimate Partner Violence: Not At Risk (  11/25/2023)   Humiliation, Afraid, Rape, and Kick questionnaire    Fear of Current or Ex-Partner: No    Emotionally Abused: No    Physically Abused: No    Sexually Abused: No    Review of Systems: ROS negative except for what is noted on the assessment and plan.  There were no vitals filed for this visit.  Physical Exam: Constitutional: well-appearing *** sitting in ***, in no acute distress HENT: normocephalic atraumatic, mucous membranes moist Eyes: conjunctiva non-erythematous Cardiovascular: regular rate and rhythm, no m/r/g Pulmonary/Chest: normal work of breathing on room air, lungs clear to auscultation bilaterally Abdominal: soft, non-tender, non-distended MSK: normal bulk and  tone Neurological: alert & oriented x 3, no focal deficit Skin: warm and dry Psych: normal mood and behavior  Assessment & Plan:     Patient {GC/GE:[id number]::"discussed with","seen with"} Dr. {[id number]::"[id number]","G. Hoffman","Mullen","Narendra","Vincent","Guilloud","Lau","Machen"}  No problem-specific Assessment & Plan notes found for this encounter.   Jose Ngo, MD [facility] Internal Medicine, PGY-1 Phone: [phone] Date 12/17/2023 Time 9:52 AM

## 2023-12-19 LAB — RENAL FUNCTION PANEL
Albumin: 4.4 g/dL (ref 4.1–5.1)
BUN/Creatinine Ratio: 13 (ref 9–20)
BUN: 33 mg/dL — ABNORMAL HIGH (ref 6–24)
CO2: 21 mmol/L (ref 20–29)
Calcium: 9.9 mg/dL (ref 8.7–10.2)
Chloride: 103 mmol/L (ref 96–106)
Creatinine, Ser: 2.51 mg/dL — ABNORMAL HIGH (ref 0.76–1.27)
Glucose: 95 mg/dL (ref 70–99)
Phosphorus: 4.4 mg/dL — ABNORMAL HIGH (ref 2.8–4.1)
Potassium: 4.6 mmol/L (ref 3.5–5.2)
Sodium: 140 mmol/L (ref 134–144)
eGFR: 31 mL/min/{1.73_m2} — ABNORMAL LOW (ref >59)

## 2023-12-19 NOTE — Progress Notes (Signed)
 Internal Medicine Clinic Attending  Case discussed with the resident at the time of the visit.  We reviewed the resident's history and exam and pertinent patient test results.  I agree with the assessment, diagnosis, and plan of care documented in the resident's note.

## 2023-12-23 ENCOUNTER — Ambulatory Visit: Payer: Medicare (Managed Care) | Attending: Nephrology | Admitting: Physical Therapy

## 2023-12-23 ENCOUNTER — Other Ambulatory Visit: Payer: Self-pay

## 2023-12-23 ENCOUNTER — Encounter: Payer: Self-pay | Admitting: Physical Therapy

## 2023-12-23 DIAGNOSIS — R531 Weakness: Secondary | ICD-10-CM | POA: Insufficient documentation

## 2023-12-23 DIAGNOSIS — R2689 Other abnormalities of gait and mobility: Secondary | ICD-10-CM | POA: Diagnosis not present

## 2023-12-23 DIAGNOSIS — R600 Localized edema: Secondary | ICD-10-CM | POA: Insufficient documentation

## 2023-12-23 DIAGNOSIS — M21371 Foot drop, right foot: Secondary | ICD-10-CM | POA: Diagnosis not present

## 2023-12-23 DIAGNOSIS — M25671 Stiffness of right ankle, not elsewhere classified: Secondary | ICD-10-CM | POA: Diagnosis not present

## 2023-12-23 NOTE — Therapy (Signed)
 OUTPATIENT PHYSICAL THERAPY LOWER EXTREMITY EVALUATION   Patient Name: Kenneth Hunt MRN: [id number] DOB:Mar 29, 1981, 43 y.o., male Today's Date: 12/23/2023  END OF SESSION:  PT End of Session - 12/23/23 1702     Visit Number 1    Number of Visits 9    Date for PT Re-Evaluation 02/17/24    PT Start Time 1530    PT Stop Time 1610    PT Time Calculation (min) 40 min    Activity Tolerance Patient tolerated treatment well    Behavior During Therapy Anxious             Past Medical History:  Diagnosis Date   Anal fistula    Anxiety    Bipolar disorder (HCC)    Brachial plexus disorders 07/31/2018   Seen by [facility] neurology. MRI brain and cervical spine showed bilateral neural foraminal cysts C4-C5, C5-C6, C6-C7, brain within normal limits.  - continue cymbalta , flexeril, neurontin  - f/u with Dr. Marden Shaggy [facility] neurology 03/28/19   Depression    History of syphilis 12/30/2017   RPR 1:1 12/25/17 s/p adequate treatment   HIV (human immunodeficiency virus infection) (HCC)    IV drug abuse (HCC)    TOS (thoracic outlet syndrome)    Past Surgical History:  Procedure Laterality Date   EVALUATION UNDER ANESTHESIA WITH ANAL FISSUROTOMY N/A 02/22/2023   Procedure: INTERROGATION EXAM UNDER ANESTHESIA WITH ANAL FISTULOTOMY;  Surgeon: Joyce Nixon, MD;  Location: [facility];  Service: General;  Laterality: N/A;   IR FLUORO GUIDE CV LINE RIGHT  11/26/2023   IR US  GUIDE VASC ACCESS RIGHT  11/26/2023   MINOR FULGERATION OF ANAL CONDYLOMA  02/22/2023   Procedure: ABLATION OF ANAL CONDYLOMA;  Surgeon: Joyce Nixon, MD;  Location: [facility];  Service: General;;   thoracic outlet syndrome surgery  2013   Patient Active Problem List   Diagnosis Date Noted   Rhabdomyolysis 12/17/2023   Leg swelling 12/11/2023   Abdominal pain 12/08/2023   Peripheral edema 12/05/2023   Acute deep vein thrombosis (DVT) of both lower extremities (HCC)  12/02/2023   Constipation 11/28/2023   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 11/28/2023   Hyponatremia 11/25/2023   Hyperkalemia 11/25/2023   DVT, lower extremity, distal, acute, bilateral (HCC) 11/25/2023   High transaminase levels 11/25/2023   Electrolyte disturbance 11/25/2023   AKI (acute kidney injury) (HCC) 11/24/2023   Seborrheic dermatitis 08/27/2023   Weight gain 08/27/2023   Gastroenteritis 05/08/2023   Left foot drop 04/24/2023   Blood blister 10/09/2022   Fatigue 08/20/2022   Encounter for screening involving social determinants of health (SDoH) 01/13/2021   Amphetamine and psychostimulant-induced psychosis with delusions (HCC)    Healthcare maintenance 10/17/2020   Hip pain, bilateral 10/17/2020   Methamphetamine dependence (HCC) 10/13/2020   Psychoactive substance-induced psychosis (HCC) 10/13/2020   Injection of illicit drug within last 12 months 08/26/2019   Bipolar 2 disorder (HCC) 03/04/2019   Asymptomatic HIV infection, CD4 >=500 (HCC) 10/01/2018   Thoracic outlet syndrome 10/01/2018   Brachial plexus disorders 07/31/2018   Methamphetamine use disorder, moderate, in early remission (HCC) 12/30/2017   Moderate recurrent major depression (HCC) 12/30/2017   History of syphilis 12/30/2017   Anxiety 02/16/2015    PCP: Arellano, Zemeza, Priscila, MD  REFERRING PROVIDER: Nicolas Barren, MD  REFERRING DIAG: Peripheral edema [R60.0], Weakness [R53.1]   Rationale for Evaluation and Treatment: Rehabilitation  THERAPY DIAG:  Stiffness of right ankle, not elsewhere classified  Foot drop,  right  Other abnormalities of gait and mobility  PERTINENT HISTORY: Rhabdomyolysis   WEIGHT BEARING RESTRICTIONS: No  FALLS:  Has patient fallen in last 6 months? No  LIVING ENVIRONMENT: Lives with: lives alone Lives in: House/apartment Stairs: No Has following equipment at home: None  OCCUPATION: on disability   PRECAUTIONS:  None ---------------------------------------------------------------------------------------------  SUBJECTIVE:   SUBJECTIVE STATEMENT: Eval statement 12/23/2023: Pt stated they have a dropped foot on the R side following a blood clot and rhabdomyolysis. Has trouble tapping his feet, and currently steps higher to compensate. Has had some n/t along lateral lower leg and in all 5 toes. Has issues walking fast.   RED FLAGS: DVT screen - negative   PLOF: Independent  PATIENT GOALS: be able to walk better  NEXT MD VISIT: 01/21/2024 ---------------------------------------------------------------------------------------------  OBJECTIVE:  Note: Objective measures were completed at Evaluation unless otherwise noted. PATIENT SURVEYS:  Patient-specific activity scoring scheme (Point to one number): (unable) 0-10 (able at or beyond PLOF)  Activity Initial 1.Walk safely: 1 2. to dorsiflex ball of foot or toes: 1 3. to tap foot: 1  sum of the activity scores/number of activities; Total score =3  Minimum detectable change (90%CI) for average score = 2 points Minimum detectable change (90%CI) for single activity score = 3 points   COGNITION: Overall cognitive status: Within functional limits for tasks assessed     SENSATION: Light touch: Impaired   EDEMA:  Moderate calf/ankle swelling, minimal erythema, no tenderness to calf  MUSCLE LENGTH: R gastroc, limited  POSTURE: No Significant postural limitations  PALPATION: No significant finding  LOWER EXTREMITY ROM:  Active ROM Right eval Left eval  Hip flexion    Hip extension    Hip abduction    Hip adduction    Hip internal rotation    Hip external rotation    Knee flexion    Knee extension    Ankle dorsiflexion -30 20  Ankle plantarflexion 50 50  Ankle inversion    Ankle eversion     (Blank rows = not tested)  ! Indicates pain with testing  LOWER EXTREMITY MMT:  MMT Right eval Left eval  Hip flexion    Hip  extension    Hip abduction    Hip adduction    Hip internal rotation    Hip external rotation    Knee flexion    Knee extension    Ankle dorsiflexion 0 4-  Ankle plantarflexion 4- 5  Ankle inversion    Ankle eversion     (Blank rows = not tested)  ! Indicates pain with testing  LOWER EXTREMITY SPECIAL TESTS:  Ankle special tests: Tinel's test-Deep peroneal: negative GAIT: Distance walked: 166ft  Assistive device utilized: None Level of assistance: Complete Independence Comments: R foot drop  [facility] Adult PT Treatment:                                                DATE: 12/23/2023 Self Care: Pt education, detailed below POC discussion    PATIENT EDUCATION:  Education details: Pt received education regarding HEP performance, ADL performance, functional activity tolerance, impairment education, appropriate performance of therapeutic activities. Person educated: Patient Education method: Explanation, Demonstration, Tactile cues, and Verbal cues Education comprehension: verbalized understanding and returned demonstration  HOME EXERCISE PROGRAM: TBD ---------------------------------------------------------------------------------------------  ASSESSMENT:  CLINICAL IMPRESSION: Eval impression (12/23/2023): Pt. attended today's physical therapy session for evaluation of R drop foot. Pt has complaints of  inability to walk safely, cannot DF ankle, and cannot tap foot. Pt has notable deficits with anterior tibialis activation (0/5) Gait quality, and general foot/ankle strength. Pt would benefit from therapeutic focus on increasing DF ROM, tibialis anteriorr activation, and gait quality practice.  Treatment performed today focused on pt education detailed in obj. Pt demonstrated good understanding of education provided. required minimal verbal/tactile cues and no  physical  assistance for appropriate performance with today's activities. Pt requires the intervention of skilled outpatient physical therapy to address the aforementioned deficits and progress towards a functional level in line with therapeutic goals.   OBJECTIVE IMPAIRMENTS: Abnormal gait, difficulty walking, decreased ROM, decreased strength, increased edema, improper body mechanics, and pain.   ACTIVITY LIMITATIONS: standing, squatting, stairs, and locomotion level  PARTICIPATION LIMITATIONS: driving, shopping, and community activity  PERSONAL FACTORS: Age, Behavior pattern, Past/current experiences, Social background, and Time since onset of injury/illness/exacerbation are also affecting patient's functional outcome.   REHAB POTENTIAL: Fair social background  CLINICAL DECISION MAKING: Evolving/moderate complexity  EVALUATION COMPLEXITY: Moderate   GOALS: Goals reviewed with patient? YES  SHORT TERM GOALS: Target date: 01/20/2024 Pt will be independent with administered HEP to demonstrate the competency necessary for long term managemnet of symptoms at home.  Baseline: Goal status: INITIAL  LONG TERM GOALS: Target date: 02/17/2024  Pt. Will achieve a PSFS score of 6 as to demonstrate improvement in self-perceived functional ability with daily activities.  Baseline: 3 Goal status: INITIAL  2.  Pt will improve Global foot/ankle strength to a 4+/5 to demonstrate improvement in strength for quality of motion and activity performance.  Baseline:  Goal status: INITIAL  3.   Pt will independently ambulate 324ft with no AD and appropriate heel strike/foot clearance to demonstrate improved weightbearing tolerance, BLE strength, and functional capacity for community ambulation.  Baseline:  Goal status: INITIAL   --------------------------------------------------------------------------------------------- PLAN:  PT FREQUENCY: 1x/week  PT DURATION: 8 weeks  PLANNED  INTERVENTIONS: 97110-Therapeutic exercises, 97530- Therapeutic activity, [id number]- Neuromuscular re-education, 97535- Self Care, [id number]- Manual therapy, [phone]- Gait training, [phone]- Electrical stimulation (manual), Balance training, Stair training, Taping, Dry Needling, and Joint mobilization  PLAN FOR NEXT SESSION: Review HEP, Begin POC as detailed in the assessment   Albesa Huguenin, PT, DPT 12/23/2023, 5:03 PM

## 2023-12-25 DIAGNOSIS — N179 Acute kidney failure, unspecified: Secondary | ICD-10-CM | POA: Diagnosis not present

## 2023-12-27 ENCOUNTER — Other Ambulatory Visit: Payer: Self-pay | Admitting: Student

## 2023-12-27 NOTE — Telephone Encounter (Signed)
 Copied from CRM [id number]. Topic: Clinical - Medication Refill >> Dec 27, 2023  2:04 PM Tisa Forester wrote: Medication: apixaban  (ELIQUIS ) 5 MG TABS tablet    Has the patient contacted their pharmacy? Yes (Agent: If no, request that the patient contact the pharmacy for the refill. If patient does not wish to contact the pharmacy document the reason why and proceed with request.) (Agent: If yes, when and what did the pharmacy advise?)  This is the patient's preferred pharmacy:  [id number] - [place], [place] - [address] AT [facility] OF [address] [place] [address] Phone: [phone] Fax: [phone]  [facility] [address] [place] [address] Phone: [phone] Fax: [phone]  Is this the correct pharmacy for this prescription? Yes If no, delete pharmacy and type the correct one.   Has the prescription been filled recently? No  Is the patient out of the medication? No almost out   Has the patient been seen for an appointment in the last year OR does the patient have an upcoming appointment? Yes  Can we respond through MyChart? Yes  Agent: Please be advised that Rx refills may take up to 3 business days. We ask that you follow-up with your pharmacy.

## 2023-12-30 MED ORDER — APIXABAN 5 MG PO TABS: 5.0000 mg | ORAL_TABLET | Freq: Two times a day (BID) | ORAL | 0 refills | Status: DC

## 2024-01-08 ENCOUNTER — Encounter: Payer: Self-pay | Admitting: Physical Therapy

## 2024-01-08 ENCOUNTER — Ambulatory Visit: Payer: Medicare (Managed Care) | Admitting: Physical Therapy

## 2024-01-08 DIAGNOSIS — R2689 Other abnormalities of gait and mobility: Secondary | ICD-10-CM

## 2024-01-08 DIAGNOSIS — M25671 Stiffness of right ankle, not elsewhere classified: Secondary | ICD-10-CM

## 2024-01-08 DIAGNOSIS — M21371 Foot drop, right foot: Secondary | ICD-10-CM

## 2024-01-08 NOTE — Therapy (Signed)
 OUTPATIENT PHYSICAL THERAPY TREATMENT   Patient Name: Kenneth Hunt MRN: [id number] DOB:[birth year], 43 y.o., male Today's Date: 01/08/2024  END OF SESSION:  PT End of Session - 01/08/24 1507     Visit Number 2    Number of Visits 9    Date for PT Re-Evaluation 02/17/24    PT Start Time 1445    PT Stop Time 1525    PT Time Calculation (min) 40 min    Activity Tolerance Patient tolerated treatment well    Behavior During Therapy [facility] for tasks assessed/performed;Anxious              Past Medical History:  Diagnosis Date   Anal fistula    Anxiety    Bipolar disorder (HCC)    Brachial plexus disorders 07/31/2018   Seen by [facility] neurology. MRI brain and cervical spine showed bilateral neural foraminal cysts C4-C5, C5-C6, C6-C7, brain within normal limits.  - continue cymbalta , flexeril, neurontin  - f/u with Dr. Marden Shaggy [facility] neurology 03/28/19   Depression    History of syphilis 12/30/2017   RPR 1:1 12/25/17 s/p adequate treatment   HIV (human immunodeficiency virus infection) (HCC)    IV drug abuse (HCC)    TOS (thoracic outlet syndrome)    Past Surgical History:  Procedure Laterality Date   EVALUATION UNDER ANESTHESIA WITH ANAL FISSUROTOMY N/A 02/22/2023   Procedure: INTERROGATION EXAM UNDER ANESTHESIA WITH ANAL FISTULOTOMY;  Surgeon: Joyce Nixon, MD;  Location: [facility];  Service: General;  Laterality: N/A;   IR FLUORO GUIDE CV LINE RIGHT  11/26/2023   IR US  GUIDE VASC ACCESS RIGHT  11/26/2023   MINOR FULGERATION OF ANAL CONDYLOMA  02/22/2023   Procedure: ABLATION OF ANAL CONDYLOMA;  Surgeon: Joyce Nixon, MD;  Location: [facility];  Service: General;;   thoracic outlet syndrome surgery  2013   Patient Active Problem List   Diagnosis Date Noted   Rhabdomyolysis 12/17/2023   Leg swelling 12/11/2023   Abdominal pain 12/08/2023   Peripheral edema 12/05/2023   Acute deep vein thrombosis (DVT) of both lower  extremities (HCC) 12/02/2023   Constipation 11/28/2023   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 11/28/2023   Hyponatremia 11/25/2023   Hyperkalemia 11/25/2023   DVT, lower extremity, distal, acute, bilateral (HCC) 11/25/2023   High transaminase levels 11/25/2023   Electrolyte disturbance 11/25/2023   AKI (acute kidney injury) (HCC) 11/24/2023   Seborrheic dermatitis 08/27/2023   Weight gain 08/27/2023   Gastroenteritis 05/08/2023   Left foot drop 04/24/2023   Blood blister 10/09/2022   Fatigue 08/20/2022   Encounter for screening involving social determinants of health (SDoH) 01/13/2021   Amphetamine and psychostimulant-induced psychosis with delusions (HCC)    Healthcare maintenance 10/17/2020   Hip pain, bilateral 10/17/2020   Methamphetamine dependence (HCC) 10/13/2020   Psychoactive substance-induced psychosis (HCC) 10/13/2020   Injection of illicit drug within last 12 months 08/26/2019   Bipolar 2 disorder (HCC) 03/04/2019   Asymptomatic HIV infection, CD4 >=500 (HCC) 10/01/2018   Thoracic outlet syndrome 10/01/2018   Brachial plexus disorders 07/31/2018   Methamphetamine use disorder, moderate, in early remission (HCC) 12/30/2017   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 No  LIVING ENVIRONMENT: Lives with: lives alone Lives in: House/apartment Stairs: No Has following equipment at home: None  OCCUPATION: on  disability   PRECAUTIONS: None ---------------------------------------------------------------------------------------------  SUBJECTIVE:   SUBJECTIVE STATEMENT: Pt attended today's session with reports of 0/10 pain. Pt stated that they feel like they have been walking better though still struggle to DF R great toe/ankle   Eval statement 12/23/2023: Pt stated they have a dropped foot on the R side following a blood clot and rhabdomyolysis. Has trouble tapping his feet, and currently steps higher to compensate. Has had some n/t along lateral lower leg and in all 5 toes. Has issues walking fast.   RED FLAGS: DVT screen - negative   PLOF: Independent  PATIENT GOALS: be able to walk better  NEXT MD VISIT: 01/21/2024 ---------------------------------------------------------------------------------------------  OBJECTIVE:  Note: Objective measures were completed at Evaluation unless otherwise noted. PATIENT SURVEYS:  Patient-specific activity scoring scheme (Point to one number): (unable) 0-10 (able at or beyond PLOF)  Activity Initial 1.Walk safely: 1 2. to dorsiflex ball of foot or toes: 1 3. to tap foot: 1  sum of the activity scores/number of activities; Total score =3  Minimum detectable change (90%CI) for average score = 2 points Minimum detectable change (90%CI) for single activity score = 3 points   COGNITION: Overall cognitive status: Within functional limits for tasks assessed     SENSATION: Light touch: Impaired   EDEMA:  Moderate calf/ankle swelling, minimal erythema, no tenderness to calf  MUSCLE LENGTH: R gastroc, limited  POSTURE: No Significant postural limitations  PALPATION: No significant finding  LOWER EXTREMITY ROM:  Active ROM Right eval Left eval  Hip flexion    Hip extension    Hip abduction    Hip adduction    Hip internal rotation    Hip external rotation    Knee flexion    Knee extension    Ankle dorsiflexion -30 20  Ankle  plantarflexion 50 50  Ankle inversion    Ankle eversion     (Blank rows = not tested)  ! Indicates pain with testing  LOWER EXTREMITY MMT:  MMT Right eval Left eval  Hip flexion    Hip extension    Hip abduction    Hip adduction    Hip internal rotation    Hip external rotation    Knee flexion    Knee extension    Ankle dorsiflexion 0 4-  Ankle plantarflexion 4- 5  Ankle inversion    Ankle eversion     (Blank rows = not tested)  ! Indicates pain with testing  LOWER EXTREMITY SPECIAL TESTS:  Ankle special tests: Tinel's test-Deep peroneal: negative GAIT: Distance walked: 168ft  Assistive device utilized: None Level of assistance: Complete Independence Comments: R foot drop   [facility] Adult PT Treatment:                                                DATE: 01/08/2024  Neuromuscular re-ed: Russian NMES to R anterior tibialis 15' with Active DF in supine (10/10, 50ms, intensity:100; trace contraction) Setup and education. Precautions, rationale. Answering all questions Mirror therapy using seated ankle DF 2x15, hold 3s  [facility] Adult PT Treatment:                                                DATE: 12/23/2023 Self Care: Pt education, detailed below POC discussion    PATIENT EDUCATION:  Education details: Pt received education regarding HEP performance, ADL performance, functional activity tolerance, impairment education, appropriate performance of therapeutic activities. Person educated: Patient Education method: Explanation, Demonstration, Tactile cues, and Verbal cues Education comprehension: verbalized understanding and returned demonstration  HOME EXERCISE PROGRAM: Seated R ankle dorsiflexion, 2x15, hold 5s, perform 2-3  daily ---------------------------------------------------------------------------------------------  ASSESSMENT:  CLINICAL IMPRESSION: Pt attended physical therapy session for continuation of treatment regarding R drop foot. Today's treatment focused on improvement of  R tib anterior innervation and activation. Pt showed  good tolerance to administered treatment with no adverse effects by the end of session. Skilled intervention was utilized via activity modification for pt tolerance with task completion, functional progression/regression promoting best outcomes inline with current rehab goals, as well as minimal verbal/tactile cuing alongside no physical assistance for safe and appropriate performance of today's activities. Continue to progress as tolerated, trace contraction was noted by the end of today's session.    Eval impression (12/23/2023): Pt. attended today's physical therapy session for evaluation of R drop foot. Pt has complaints of  inability to walk safely, cannot DF ankle, and cannot tap foot. Pt has notable deficits with anterior tibialis activation (0/5) Gait quality, and general foot/ankle strength. Pt would benefit from therapeutic focus on increasing DF ROM, tibialis anteriorr activation, and gait quality practice.  Treatment performed today focused on pt education detailed in obj. Pt demonstrated good understanding of education provided. required minimal verbal/tactile cues and no physical  assistance for appropriate performance with today's activities. Pt requires the intervention of skilled outpatient physical therapy to address the aforementioned deficits and progress towards a functional level in line with therapeutic goals.   OBJECTIVE IMPAIRMENTS: Abnormal gait, difficulty walking, decreased ROM, decreased strength, increased edema, improper body mechanics, and pain.   ACTIVITY LIMITATIONS: standing, squatting, stairs, and locomotion level  PARTICIPATION LIMITATIONS:  driving, shopping, and community activity  PERSONAL FACTORS: Age, Behavior pattern, Past/current experiences, Social background, and Time since onset of injury/illness/exacerbation are also affecting patient's functional outcome.   REHAB POTENTIAL: Fair social background  CLINICAL DECISION MAKING: Evolving/moderate complexity  EVALUATION COMPLEXITY: Moderate   GOALS: Goals reviewed with patient? YES  SHORT TERM GOALS: Target date: 01/20/2024 Pt will be independent with administered HEP to demonstrate the competency necessary for long term managemnet of symptoms at home.  Baseline: Goal status: INITIAL  LONG TERM GOALS: Target date: 02/17/2024  Pt. Will achieve a PSFS score of 6 as to demonstrate improvement in self-perceived functional ability with daily activities.  Baseline: 3 Goal status: INITIAL  2.  Pt will improve Global foot/ankle strength to a 4+/5 to demonstrate improvement in strength for quality of motion and activity performance.  Baseline:  Goal status: INITIAL  3.   Pt will independently ambulate 334ft with no AD and appropriate heel strike/foot clearance to demonstrate improved weightbearing tolerance, BLE strength, and functional capacity for community ambulation.  Baseline:  Goal status: INITIAL   --------------------------------------------------------------------------------------------- PLAN:  PT FREQUENCY: 1x/week  PT DURATION: 8 weeks  PLANNED INTERVENTIONS: 97110-Therapeutic exercises, 97530- Therapeutic activity, [id number]- Neuromuscular re-education, 97535- Self Care, 16109- Manual therapy, [id number]- Gait training, [phone]-  Electrical stimulation (manual), Balance training, Stair training, Taping, Dry Needling, and Joint mobilization  PLAN FOR NEXT SESSION: Review HEP, Begin POC as detailed in the assessment   Albesa Huguenin, PT, DPT 01/08/2024, 3:30 PM

## 2024-01-15 ENCOUNTER — Ambulatory Visit: Payer: Medicare (Managed Care) | Admitting: Physical Therapy

## 2024-01-21 ENCOUNTER — Encounter: Payer: Self-pay | Admitting: Student

## 2024-01-21 ENCOUNTER — Ambulatory Visit: Payer: Medicare (Managed Care) | Admitting: Student

## 2024-01-21 VITALS — BP 123/82 | HR 108 | Temp 99.0°F | Ht 63.0 in | Wt 192.6 lb

## 2024-01-21 DIAGNOSIS — Z7901 Long term (current) use of anticoagulants: Secondary | ICD-10-CM

## 2024-01-21 DIAGNOSIS — I824Z3 Acute embolism and thrombosis of unspecified deep veins of distal lower extremity, bilateral: Secondary | ICD-10-CM

## 2024-01-21 DIAGNOSIS — R7401 Elevation of levels of liver transaminase levels: Secondary | ICD-10-CM

## 2024-01-21 DIAGNOSIS — G5731 Lesion of lateral popliteal nerve, right lower limb: Secondary | ICD-10-CM | POA: Diagnosis not present

## 2024-01-21 DIAGNOSIS — N179 Acute kidney failure, unspecified: Secondary | ICD-10-CM

## 2024-01-21 NOTE — Patient Instructions (Addendum)
 Thank you, Mr.Deni Mione for allowing us  to provide your care today. Today we discussed your recent hospitalization, physical therapy, and lab work.   I have ordered the following labs for you:  Lab Orders         CMP14 + Anion Gap       Tests ordered today:  None  Referrals ordered today:   Referral Orders  No referral(s) requested today     I have ordered the following medication/changed the following medications:   Stop the following medications: There are no discontinued medications.   Start the following medications: No orders of the defined types were placed in this encounter.    Follow up: 6-8 weeks for elevated LFTs and AKI   Remember:   - I will call you with the results of your labs today and we can discuss whether you will need to see the nephrologist.   - Please continue taking your Eliquis  5 mg BID.   - Please ask your physical therapist about any adaptive equipment and home exercises that may be helpful.   Should you have any questions or concerns please call the internal medicine clinic at [phone].     Juriel Cid Arellano Zameza, MD PGY-1 Internal Medicine Teaching Prog[facility]

## 2024-01-22 ENCOUNTER — Telehealth: Payer: Self-pay | Admitting: *Deleted

## 2024-01-22 LAB — CMP14 + ANION GAP
ALT: 17 IU/L (ref 0–44)
AST: 17 IU/L (ref 0–40)
Albumin: 4.5 g/dL (ref 4.1–5.1)
Alkaline Phosphatase: 85 IU/L (ref 44–121)
Anion Gap: 15 mmol/L (ref 10.0–18.0)
BUN/Creatinine Ratio: 11 (ref 9–20)
BUN: 12 mg/dL (ref 6–24)
Bilirubin Total: 0.3 mg/dL (ref 0.0–1.2)
CO2: 21 mmol/L (ref 20–29)
Calcium: 9.7 mg/dL (ref 8.7–10.2)
Chloride: 102 mmol/L (ref 96–106)
Creatinine, Ser: 1.13 mg/dL (ref 0.76–1.27)
Globulin, Total: 2.6 g/dL (ref 1.5–4.5)
Glucose: 107 mg/dL — ABNORMAL HIGH (ref 70–99)
Potassium: 4.4 mmol/L (ref 3.5–5.2)
Sodium: 138 mmol/L (ref 134–144)
Total Protein: 7.1 g/dL (ref 6.0–8.5)
eGFR: 82 mL/min/{1.73_m2} (ref >59)

## 2024-01-22 NOTE — Telephone Encounter (Signed)
 RTC from patient. Sted missed call from Dr. Roddy Citizen.  Message sent to Dr. Roddy Citizen that patient had called.  Copied from CRM [id number]. Topic: Clinical - Lab/Test Results >> Jan 22, 2024  9:33 AM Maryln Sober wrote: Reason for CRM: Patient would like a return call at [phone] to discuss his lab results.

## 2024-01-23 ENCOUNTER — Telehealth: Payer: Self-pay | Admitting: *Deleted

## 2024-01-23 ENCOUNTER — Ambulatory Visit: Payer: Medicare (Managed Care) | Attending: Nephrology | Admitting: Physical Therapy

## 2024-01-23 ENCOUNTER — Encounter: Payer: Self-pay | Admitting: Physical Therapy

## 2024-01-23 DIAGNOSIS — M21371 Foot drop, right foot: Secondary | ICD-10-CM | POA: Diagnosis not present

## 2024-01-23 DIAGNOSIS — M25671 Stiffness of right ankle, not elsewhere classified: Secondary | ICD-10-CM | POA: Diagnosis not present

## 2024-01-23 DIAGNOSIS — R2689 Other abnormalities of gait and mobility: Secondary | ICD-10-CM | POA: Diagnosis not present

## 2024-01-23 NOTE — Progress Notes (Addendum)
 Established Patient Office Visit  Subjective   Patient ID: Kenneth Hunt, male    DOB: Jun 30, 1981  Age: 43 y.o. MRN: [id number]  Chief Complaint  Patient presents with   Follow-up    Patient is a 43 y.o. with a past medical history stated below who presents today for follow-up for AKI, bilateral DVTs, and common peroneal nerve dysfunction of the right leg. He was last seen at [facility] for hospitalization follow up for these issues on 12/17/2023. Please see problem based assessment and plan for additional details.      Past Medical History:  Diagnosis Date   Anal fistula    Anxiety    Bipolar disorder (HCC)    Brachial plexus disorders 07/31/2018   Seen by [facility] neurology. MRI brain and cervical spine showed bilateral neural foraminal cysts C4-C5, C5-C6, C6-C7, brain within normal limits.  - continue cymbalta , flexeril, neurontin  - f/u with Dr. 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  Cardiovascular:  Negative for chest pain and leg swelling.  Gastrointestinal:  Negative for abdominal pain, nausea and vomiting.  Genitourinary:  Negative for dysuria, hematuria and urgency.     Objective:     BP 123/82 (BP Location: Right Arm, Patient Position: Sitting, Cuff Size: Small)   Pulse (!) 108   Temp 99 F (37.2 C) (Oral)   Ht 5' 3 (1.6 m)   Wt 192 lb 9.6 oz (87.4  kg)   SpO2 98%   BMI 34.12 kg/m  BP Readings from Last 3 Encounters:  01/21/24 123/82  12/17/23 124/86  12/10/23 (!) 138/98   Wt Readings from Last 3 Encounters:  01/21/24 192 lb 9.6 oz (87.4 kg)  12/17/23 189 lb 11.2 oz (86 kg)  12/03/23 208 lb 8.9 oz (94.6 kg)      Physical Exam HENT:     Head: Normocephalic and atraumatic.   Cardiovascular:     Rate and Rhythm: Normal rate and regular rhythm.     Heart sounds: Normal heart sounds.  Pulmonary:     Effort: Pulmonary effort is normal.     Breath sounds: Normal breath sounds.  Abdominal:     General: Abdomen is flat.     Palpations: Abdomen is soft.   Musculoskeletal:        General: No swelling.     Comments: Noticeable high step gain on right side. Appears slightly unsteady while walking.    Skin:    General: Skin is warm and dry.   Neurological:     General: No focal deficit present.     Mental Status: He is alert.   Psychiatric:        Mood and Affect: Mood normal.    Results for orders placed or performed in visit on 01/21/24  CMP14 + Anion Gap  Result Value Ref Range   Glucose 107 (H) 70 - 99 mg/dL   BUN 12 6 -  24 mg/dL   Creatinine, Ser 2.95 0.76 - 1.27 mg/dL   eGFR 82 >28 UX/LKG/4.01   BUN/Creatinine Ratio 11 9 - 20   Sodium 138 134 - 144 mmol/L   Potassium 4.4 3.5 - 5.2 mmol/L   Chloride 102 96 - 106 mmol/L   CO2 21 20 - 29 mmol/L   Anion Gap 15.0 10.0 - 18.0 mmol/L   Calcium  9.7 8.7 - 10.2 mg/dL   Total Protein 7.1 6.0 - 8.5 g/dL   Albumin  4.5 4.1 - 5.1 g/dL   Globulin, Total 2.6 1.5 - 4.5 g/dL   Bilirubin Total 0.3 0.0 - 1.2 mg/dL   Alkaline Phosphatase 85 44 - 121 IU/L   AST 17 0 - 40 IU/L   ALT 17 0 - 44 IU/L   Last hemoglobin A1c Lab Results  Component Value Date   HGBA1C 5.6 08/27/2023     The 10-year ASCVD risk score (Arnett DK, et al., 2019) is: 1.1%    Assessment & Plan:   Problem List Items Addressed This Visit     AKI (acute kidney injury) (HCC) - Primary (Chronic)    Patient with Cr 5.91 on discharge and 2.51 a month ago at his last clinic visit. Repeat CMP today with Cr 1.13 which is around his baseline. Denies any symptoms or concerns with urination today. Had a nephrology appointment scheduled but given return of creatinine to baseline discussed not needing to go and continuing to follow up regularly in clinic. Patient agreeable with plan.       Relevant Orders   CMP14 + Anion Gap (Completed)   DVT, lower extremity, distal, acute, bilateral (HCC) (Chronic)   Patient following up from visit 1 month ago. Continues taking Eliquis  5 mg BID, will continue to do so for at least 2 more months. Denies any new leg swelling,  shortness of breath, or easy bruising. Endorses slight bleeding with sexual activity but stopped a few weeks ago. Denies hematochezia or other overt bleeding such as epistaxis or hematemesis.  Plan - Continue Eliquis  5 mg BID for at least 2 more months - Monitor for overt bleeding      High transaminase levels (Chronic)   AST 1578 and ALT 688 on recent hospital admission 11/24/2023. Hepatitis panel at the time negative. No CMP available at time of discharge. CMP today with AST 17 and ALT 17. Patient asymptomatic. Abdominal exam unremarkable.  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 Is currently getting electro-stimulation to his right foot. Unable to flex or extend the right big toe. Walking is still affected, with noticeable high step gait on right side. Patient states he sometimes feels unsteady but walking is better now than at the last visit. He feels more comfortable walking to and from the bus stop. Discussed talking with physical therapy about home exercises he can do as well as any assistive ambulation devices they recommend, if any.  Plan - Continue physical therapy       Other Visit Diagnoses       Transaminitis       Relevant  Orders   CMP14 + Anion Gap (Completed)      Patient discussed with Dr. Jarvis Mesa.  Return 6-8 weeks, for DVT, RLE nerve dysfunction follow up .    Maris Bena Michelina Aho, MD PGY-1, [facility]

## 2024-01-23 NOTE — Assessment & Plan Note (Signed)
 AST 1578 and ALT 688 on recent hospital admission 11/24/2023. Hepatitis panel at the time negative. No CMP available at time of discharge. CMP today with AST 17 and ALT 17. Patient asymptomatic. Abdominal exam unremarkable.  Plan - Monitor as needed

## 2024-01-23 NOTE — Assessment & Plan Note (Signed)
 Has been participating in physical therapy weekly. Is currently getting electro-stimulation to his right foot. Unable to flex or extend the right big toe. Walking is still affected, with noticeable high step gait on right side. Patient states he sometimes feels unsteady but walking is better now than at the last visit. He feels more comfortable walking to and from the bus stop. Discussed talking with physical therapy about home exercises he can do as well as any assistive ambulation devices they recommend, if any.  Plan - Continue physical therapy

## 2024-01-23 NOTE — Assessment & Plan Note (Signed)
 Patient with Cr 5.91 on discharge and 2.51 a month ago at his last clinic visit. Repeat CMP today with Cr 1.13 which is around his baseline. Denies any symptoms or concerns with urination today. Had a nephrology appointment scheduled but given return of creatinine to baseline discussed not needing to go and continuing to follow up regularly in clinic. Patient agreeable with plan.

## 2024-01-23 NOTE — Assessment & Plan Note (Signed)
 Patient following up from visit 1 month ago. Continues taking Eliquis  5 mg BID, will continue to do so for at least 2 more months. Denies any new leg swelling,  shortness of breath, or easy bruising. Endorses slight bleeding with sexual activity but stopped a few weeks ago. Denies hematochezia or other overt bleeding such as epistaxis or hematemesis.  Plan - Continue Eliquis  5 mg BID for at least 2 more months - Monitor for overt bleeding

## 2024-01-23 NOTE — Therapy (Signed)
 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 MRI brain and cervical spine showed bilateral neural foraminal cysts C4-C5, C5-C6, C6-C7, brain within normal limits.  - continue cymbalta , flexeril, neurontin  - f/u with Dr. 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   Rhabdomyolysis 12/17/2023   TOS (thoracic outlet syndrome)    Past Surgical History:  Procedure Laterality Date   EVALUATION UNDER ANESTHESIA WITH ANAL FISSUROTOMY N/A 02/22/2023   Procedure: INTERROGATION EXAM UNDER ANESTHESIA WITH ANAL FISTULOTOMY;  Surgeon: Joyce Nixon, MD;  Location: [facility];  Service: General;  Laterality: N/A;   IR FLUORO GUIDE CV LINE RIGHT  11/26/2023   IR US  GUIDE VASC ACCESS RIGHT  11/26/2023   MINOR FULGERATION OF ANAL CONDYLOMA  02/22/2023   Procedure: ABLATION OF ANAL CONDYLOMA;   Surgeon: Joyce Nixon, MD;  Location: [facility];  Service: General;;   thoracic outlet syndrome surgery  2013   Patient Active Problem List   Diagnosis Date Noted   Abdominal pain 12/08/2023   Constipation 11/28/2023   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 11/28/2023   DVT, lower extremity, distal, acute, bilateral (HCC) 11/25/2023   High transaminase levels 11/25/2023   AKI (acute kidney injury) (HCC) 11/24/2023   Seborrheic dermatitis 08/27/2023   Weight gain 08/27/2023   Left foot drop 04/24/2023   Blood blister 10/09/2022   Fatigue 08/20/2022   Healthcare maintenance 10/17/2020   Hip pain, bilateral 10/17/2020   Bipolar 2 disorder (HCC) 03/04/2019   Asymptomatic HIV infection, CD4 >=500 (HCC) 10/01/2018   Thoracic outlet syndrome 10/01/2018   Brachial plexus disorders 07/31/2018   Methamphetamine use disorder, moderate, in early remission (HCC) 12/30/2017   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 No  LIVING ENVIRONMENT: Lives with: lives alone Lives in: House/apartment Stairs: No  Has following equipment at home: None  OCCUPATION: on disability   PRECAUTIONS: None ---------------------------------------------------------------------------------------------  SUBJECTIVE:   SUBJECTIVE STATEMENT: Pt continues to report minimal change in symptoms. Still unable to tap foot or initiate great toe/ankle  Dorsiflexion.   Eval statement 12/23/2023: Pt stated they have a dropped foot on the R side following a blood clot and rhabdomyolysis. Has trouble tapping his feet, and currently steps higher to compensate. Has had some n/t along lateral lower leg and in all 5 toes. Has issues walking fast.   RED FLAGS: DVT screen - negative   PLOF: Independent  PATIENT GOALS: be able to walk better  NEXT MD VISIT: 01/21/2024 ---------------------------------------------------------------------------------------------  OBJECTIVE:  Note: Objective measures were completed at Evaluation unless otherwise noted. PATIENT SURVEYS:  Patient-specific activity scoring scheme (Point to one number): (unable) 0-10 (able at or beyond PLOF)  Activity Initial 1.Walk safely: 1 2. to dorsiflex ball of foot or toes: 1 3. to tap foot: 1  sum of the activity scores/number of activities; Total score =3  Minimum detectable change (90%CI) for average score = 2 points Minimum detectable change (90%CI) for single activity score = 3 points   COGNITION: Overall cognitive status: Within functional limits for tasks assessed     SENSATION: Light touch: Impaired   EDEMA:  Moderate calf/ankle swelling, minimal erythema, no tenderness to calf  MUSCLE LENGTH: R gastroc, limited  POSTURE: No Significant postural limitations  PALPATION: No significant finding  LOWER EXTREMITY ROM:  Active ROM Right eval Left eval  Hip flexion    Hip extension    Hip abduction    Hip adduction    Hip internal rotation    Hip external rotation    Knee flexion    Knee extension    Ankle dorsiflexion -30 20  Ankle plantarflexion 50 50  Ankle inversion    Ankle eversion     (Blank rows = not tested)  ! Indicates pain with testing  LOWER EXTREMITY MMT:  MMT Right eval Left eval  Hip flexion    Hip extension    Hip abduction    Hip adduction    Hip internal rotation    Hip external rotation    Knee flexion    Knee  extension    Ankle dorsiflexion 0 4-  Ankle plantarflexion 4- 5  Ankle inversion    Ankle eversion     (Blank rows = not tested)  ! Indicates pain with testing  LOWER EXTREMITY SPECIAL TESTS:  Ankle special tests: Tinel's test-Deep peroneal: negative GAIT: Distance walked: 121ft  Assistive device utilized: None Level of assistance: Complete Independence Comments: R foot drop   [facility] Adult PT Treatment:                                                DATE: 01/08/2024  Neuromuscular re-ed: Russian NMES to R anterior tibialis 15' with Active DF in supine (10/10, 50ms, intensity:100; trace contraction) Setup and education. Precautions, rationale. Answering all questions Mirror therapy using seated ankle DF 2x15, hold 3s  [facility] Adult PT Treatment:                                                DATE: 12/23/2023 Self Care: Pt education, detailed below POC discussion    PATIENT EDUCATION:  Education details: Pt received education regarding HEP performance, ADL performance, functional activity tolerance, impairment education, appropriate performance of therapeutic activities. Person educated: Patient Education method: Explanation, Demonstration, Tactile cues, and Verbal cues Education comprehension: verbalized understanding and returned demonstration  HOME EXERCISE PROGRAM: Seated R ankle dorsiflexion, 2x15, hold 5s, perform 2-3 daily ---------------------------------------------------------------------------------------------  ASSESSMENT:  CLINICAL IMPRESSION: Pt attended physical therapy session for continuation of treatment regarding R drop foot. Today's treatment focused on re-evaluative findings and POC discussion regarding symptom presentation, therapeutic resources available and recommended next steps for best outcomes in line with pt goals. Pt  continues to demonstrate no Ant tib contraction, or ability to initiate Great toe/ankle DF. Pt has been compliant with HEP, and no significant results have been gained from mirror therapy or NMES. It is recommended that pt return to PCP for a referral to neurology for a nerve conduction test of the common peroneal nerve, deep peroneal nerve, and L4 spinal nerve. There is also need for further testing to rule out an occluded artery in an area such as sup/inf gluteal artery which could present with these symptoms.  Pt understood this education and is to be d/c from PT services at completion of today's session d/t maximized rehab potential.   Eval impression (12/23/2023): Pt. attended today's physical therapy session for evaluation of R drop foot. Pt has complaints of  inability to walk safely, cannot DF ankle, and cannot tap foot. Pt has notable deficits with anterior tibialis activation (0/5) Gait quality, and general foot/ankle strength. Pt would benefit from therapeutic focus on increasing DF ROM, tibialis anteriorr activation, and gait quality practice.  Treatment performed today focused on pt education detailed in obj. Pt demonstrated good understanding of education provided. required minimal verbal/tactile cues and no physical  assistance for appropriate performance with today's activities. Pt requires the intervention of skilled outpatient physical therapy to address the aforementioned deficits and progress towards a functional level in line with therapeutic goals.   OBJECTIVE IMPAIRMENTS: Abnormal gait, difficulty walking, decreased ROM, decreased strength, increased edema, improper body mechanics, and pain.   ACTIVITY LIMITATIONS: standing, squatting, stairs, and locomotion level  PARTICIPATION LIMITATIONS: driving, shopping, and community activity  PERSONAL FACTORS: Age, Behavior pattern, Past/current experiences, Social background, and Time since onset of injury/illness/exacerbation are also  affecting patient's functional outcome.   REHAB POTENTIAL: Fair social background  CLINICAL DECISION MAKING: Evolving/moderate complexity  EVALUATION COMPLEXITY: Moderate   GOALS: Goals reviewed with patient? YES  SHORT TERM GOALS: Target date: 01/20/2024 Pt will be independent with administered HEP to demonstrate the competency necessary for long term managemnet of symptoms at home.  Baseline: Goal status: MET 01/23/2024  LONG TERM GOALS: Target date: 02/17/2024  Pt. Will achieve a PSFS score of 6 as to demonstrate improvement in self-perceived functional ability with daily activities.  Baseline: 3 Goal status: NOT MET 01/23/2024 (3)  2.  Pt will improve Global foot/ankle strength to a 4+/5 to demonstrate improvement in strength for quality of motion and activity performance. Baseline:  Goal status: NOT MET 01/23/2024  3.   Pt will independently ambulate 350ft with no AD and  appropriate heel strike/foot clearance to demonstrate improved weightbearing tolerance, BLE strength, and functional capacity for community ambulation.  Baseline:  Goal status: NOT MET 01/23/2024   --------------------------------------------------------------------------------------------- PLAN:  PT FREQUENCY: 1x/week  PT DURATION: 8 weeks  PLANNED INTERVENTIONS: 97110-Therapeutic exercises, 97530- Therapeutic activity, 97112- Neuromuscular re-education, 97535- Self Care, 86578- Manual therapy, [phone]- Gait training, [phone]- Electrical stimulation (manual), Balance training, Stair training, Taping, Dry Needling, and Joint mobilization  PLAN FOR NEXT SESSION: d/c   PHYSICAL THERAPY DISCHARGE SUMMARY  Visits from Start of Care: 3  Current functional level related to goals / functional outcomes: See assessment   Remaining deficits: See assessment   Education / Equipment: See assessment   Patient agrees to discharge. Patient goals were not met. Patient is being discharged due to maximized rehab  potential.    Albesa Huguenin, PT, DPT 01/23/2024, 3:51 PM

## 2024-01-23 NOTE — Telephone Encounter (Signed)
 Call from patient states he was able to see his lab results.  Went to Pt and after 1 visit was told he needed to go to a Neurologist.  Upset tat he was told this after 1 visit would like to talk with you about today.

## 2024-01-24 ENCOUNTER — Other Ambulatory Visit: Payer: Self-pay | Admitting: Student

## 2024-01-24 ENCOUNTER — Ambulatory Visit: Payer: Self-pay | Admitting: Student

## 2024-01-24 DIAGNOSIS — G5731 Lesion of lateral popliteal nerve, right lower limb: Secondary | ICD-10-CM

## 2024-01-24 DIAGNOSIS — I824Z3 Acute embolism and thrombosis of unspecified deep veins of distal lower extremity, bilateral: Secondary | ICD-10-CM

## 2024-01-24 NOTE — Progress Notes (Signed)
 Patient called the clinic on 6/12 expressing frustration with recent PT visit. When I spoke with the patient on the phone he clarified that his physical therapist had recommended no further electro stimulation or PT sessions for right foot drop. Was recommending referral to neurology for nerve conduction test instead. Reviewed PT note from 6/12 where Albesa Huguenin, PT recommends nerve conduction test for common peroneal nerve, deep peroneal nerve, and L4 spinal nerve. 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 Patient agreeable to referral to neurology and possible future referral back to PT if appropriate. Patient did not demonstrate any acute symptoms concerning for RLE DVT on exam at his 6/10 clinic visit at [facility]. Has been compliant with Eliquis  5 mg BID. Will keep future imaging in mind to help determine when it's appropriate to stop Eliquis  or if further intervention is needed.   Manly Nestle Michelina Aho, MD PGY-1, [facility]

## 2024-01-27 NOTE — Addendum Note (Signed)
 Addended by: Dejah Droessler L on: 01/27/2024 09:36 AM   Modules accepted: Level of Service

## 2024-01-27 NOTE — Progress Notes (Signed)
 Internal Medicine Clinic Attending  Case discussed with the resident at the time of the visit.  We reviewed the resident's history and exam and pertinent patient test results.  I agree with the assessment, diagnosis, and plan of care documented in the resident's note.    Labs looking much better after hospitalization

## 2024-01-29 ENCOUNTER — Ambulatory Visit: Payer: Medicare (Managed Care) | Admitting: Physical Therapy

## 2024-02-03 ENCOUNTER — Other Ambulatory Visit: Payer: Self-pay

## 2024-02-03 MED ORDER — APIXABAN 5 MG PO TABS: 5.0000 mg | ORAL_TABLET | Freq: Two times a day (BID) | ORAL | 0 refills | Status: DC

## 2024-02-03 NOTE — Telephone Encounter (Signed)
 Unable to send in rx, rx keeps reverting back to print instead of normal.

## 2024-02-05 ENCOUNTER — Ambulatory Visit: Payer: Medicare (Managed Care) | Admitting: Physical Therapy

## 2024-02-05 MED ORDER — APIXABAN 5 MG PO TABS: 5.0000 mg | ORAL_TABLET | Freq: Two times a day (BID) | ORAL | 0 refills | Status: DC

## 2024-02-05 NOTE — Addendum Note (Signed)
 Addended by: Lionell Matuszak on: 02/05/2024 09:01 AM   Modules accepted: Orders

## 2024-02-11 ENCOUNTER — Other Ambulatory Visit: Payer: Self-pay | Admitting: Student

## 2024-02-11 DIAGNOSIS — G5731 Lesion of lateral popliteal nerve, right lower limb: Secondary | ICD-10-CM

## 2024-02-11 DIAGNOSIS — I824Z3 Acute embolism and thrombosis of unspecified deep veins of distal lower extremity, bilateral: Secondary | ICD-10-CM

## 2024-02-12 ENCOUNTER — Encounter: Payer: Medicare (Managed Care) | Admitting: Physical Therapy

## 2024-02-12 NOTE — Telephone Encounter (Addendum)
 Please see messages below requesting new referrals to be placed.  The pt is currently sch for an appt with [facility] on the following day:  Name: Kenneth Hunt, [place] MRN: [id number]  Date: 03/03/2024 Status: Sch  Time: 2:15 PM Length: 30  Visit Type: OPEN ESTABLISHED [726] Copay: $0.00  Provider: Heddy Barren, DO Department: [facility]  Referring Provider: ARELLANO ZAMEZA, PRISCILA       Copied from CRM [id number]. Topic: Referral - Question >> Feb 10, 2024 10:55 AM Alfonso ORN wrote: Reason for CRM:  Patient called to let provider know he can not wait until October for to see a neurologist  he wants to be referred somewhere that may have sooner appointment , Patient [phone] >> Feb 10, 2024 11:02 AM Alfonso ORN wrote: Patient also have question if can referral him to another physical therapist reason patient was only giving 2 physical therapy session and the physical therapist are the ones wanted to referred patient to see a neurogist  Patient stated while waiting for a new referral for another neuro gist if can get a referral in the meantime to get physical therapy

## 2024-02-24 DIAGNOSIS — D013 Carcinoma in situ of anus and anal canal: Secondary | ICD-10-CM | POA: Diagnosis not present

## 2024-03-03 ENCOUNTER — Ambulatory Visit: Payer: Medicare (Managed Care) | Admitting: Student

## 2024-03-03 VITALS — BP 118/63 | HR 92 | Temp 98.1°F | Wt 196.2 lb

## 2024-03-03 DIAGNOSIS — N179 Acute kidney failure, unspecified: Secondary | ICD-10-CM | POA: Diagnosis not present

## 2024-03-03 DIAGNOSIS — R7401 Elevation of levels of liver transaminase levels: Secondary | ICD-10-CM | POA: Diagnosis not present

## 2024-03-03 DIAGNOSIS — I824Z3 Acute embolism and thrombosis of unspecified deep veins of distal lower extremity, bilateral: Secondary | ICD-10-CM | POA: Diagnosis not present

## 2024-03-03 DIAGNOSIS — M21371 Foot drop, right foot: Secondary | ICD-10-CM

## 2024-03-03 DIAGNOSIS — G5731 Lesion of lateral popliteal nerve, right lower limb: Secondary | ICD-10-CM | POA: Diagnosis not present

## 2024-03-03 NOTE — Patient Instructions (Signed)
 Thank you, Kenneth Hunt for allowing us  to provide your care today. Today we discussed:  Referrals are sent to neurology and PT.  Please take one more month of Eliquis  and then you are finished.   I have ordered the following labs for you:  Lab Orders  No laboratory test(s) ordered today     Tests ordered today:  None   Referrals ordered today:   Referral Orders         Ambulatory referral to Physical Therapy         Ambulatory referral to Neurology       I have ordered the following medication/changed the following medications:   Stop the following medications: There are no discontinued medications.   Start the following medications: No orders of the defined types were placed in this encounter.    Follow up: as needed    Remember:   Should you have any questions or concerns please call the internal medicine clinic at [phone].     Rayann Atway, D.O. [facility]

## 2024-03-03 NOTE — Progress Notes (Unsigned)
   Established Patient Office Visit  Subjective   Patient ID: Kenneth Hunt, male    DOB: [birth year]  Age: 43 y.o. MRN: [id number]  Chief Complaint  Patient presents with  . Follow-up    Kenneth Hunt is a 43 y.o. 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  Last office visit 01/21/2024  Review of Systems:  As per assessment and Plan   Objective:     Vitals:   03/03/24 1417  BP: 118/63  Pulse: 92  Temp: 98.1 F (36.7 C)  TempSrc: Oral  SpO2: 99%  Weight: 196 lb 3.2 oz (89 kg)    Physical Exam General: Sitting in chair, no acute distress Cardiovascular: Regular rate Pulmonary: Breathing comfortably Abdomen: Soft, nontender, nondistended MSK: No lower extremity edema bilaterally  Last metabolic panel Lab Results  Component Value Date   GLUCOSE 107 (H) 01/21/2024   NA 138 01/21/2024   K 4.4 01/21/2024   CL 102 01/21/2024   CO2 21 01/21/2024   BUN 12 01/21/2024   CREATININE 1.13 01/21/2024   EGFR 82 01/21/2024   CALCIUM  9.7 01/21/2024   PHOS 4.4 (H) 12/17/2023   PROT 7.1 01/21/2024   ALBUMIN  4.5 01/21/2024   LABGLOB 2.6 01/21/2024   BILITOT 0.3 01/21/2024   ALKPHOS 85 01/21/2024   AST 17 01/21/2024   ALT 17 01/21/2024   ANIONGAP 11 12/10/2023      The 10-year ASCVD risk score (Arnett DK, et al., 2019) is: 1%    Assessment & Plan:   Patient {GC/GE:3044014::discussed with,seen with} Dr. {NAMES:3044014::Chambliss,Chun,Hoffman,Lau,Machen,Narendra,Williams,Winfrey}  AKI Lab Results  Component Value Date   CREATININE 1.13 01/21/2024   CREATININE 2.51 (H) 12/17/2023   CREATININE 5.91 (H) 12/10/2023   Admitted in 11/2023 for ARF 2/2 rhabdomyolysis, now resolved with most recent Scr 1.13, closer baseline.   Transaminitis AST 1578 and ALT 688 on recent hospital admission 11/24/2023.  Hepatitis panel at the time negative. F/u visit with AST 17 and ALT 17, resolved.   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  - R foot drop, unable to dorsiflex at R foot.  - No more two PT, did nerve stimualtion on day one, nothing else to offer - Cymbalta  30 mg capsules da - Ask chilon for referral   DVT of bilaetral LE  Noted during hospitalization 11/2023, though to be provoke due to prolonged immobilation.  Patient was started on Eliquis  5 mg twice daily completed 3 months of therapy.  Denies any obvious bleed. No meth    Problem List Items Addressed This Visit   None   No follow-ups on file.    Toma Edwards, DO

## 2024-03-04 NOTE — Progress Notes (Signed)
 Internal Medicine Clinic Attending  Case discussed with the resident at the time of the visit.  We reviewed the resident's history and exam and pertinent patient test results.  I agree with the assessment, diagnosis, and plan of care documented in the resident's note.

## 2024-03-04 NOTE — Assessment & Plan Note (Signed)
 Lab Results  Component Value Date   CREATININE 1.13 01/21/2024   CREATININE 2.51 (H) 12/17/2023   CREATININE 5.91 (H) 12/10/2023   Admitted in 11/2023 for ARF 2/2 rhabdomyolysis, now resolved with most recent Scr 1.13, back to baseline. No concerns. Resolved.

## 2024-03-04 NOTE — Assessment & Plan Note (Signed)
 Patient was referred to Neurology for nerve conduction study. Patient reports that the next avail appointment with neurology was in October 2025; however he cannot wait that long.  Reports that he would like a new referral at the other neurology locations where he can get an earlier appointment.  His primary symptom is the right foot drop, where he is unable to dorsiflex.  Reports that he only attended 2 physical therapy session, with initial session, they performed a nerve study, the next session he was told that the physical therapy will not help his foot drop.  Patient reports that he would like to continue physical therapy, would like a new referral. -Referral to neurology and physical therapy sent

## 2024-03-04 NOTE — Assessment & Plan Note (Signed)
 AST 1578 and ALT 688 on recent hospital admission 11/24/2023. Hepatitis panel at the time negative. F/u visit with AST 17 and ALT 17, resolved.

## 2024-03-04 NOTE — Assessment & Plan Note (Signed)
 Noted during hospitalization 11/2023, though to be provoke due to prolonged immobilation from Meth use. Patient was started on Eliquis  5 mg twice daily to complete 3 months of therapy.  Denies any obvious bleed. Reports that he is not doing meth anymore or has had any recent falls.  - Continue one more month of Eliquis  and then stop. Start date of Eliquis  12/13/2023, EOT 03/14/2024

## 2024-03-10 ENCOUNTER — Other Ambulatory Visit: Payer: Self-pay

## 2024-03-10 MED ORDER — APIXABAN 5 MG PO TABS: 5.0000 mg | ORAL_TABLET | Freq: Two times a day (BID) | ORAL | 0 refills | Status: DC

## 2024-03-10 NOTE — Telephone Encounter (Unsigned)
 Copied from CRM [id number]. Topic: Clinical - Medication Refill >> Mar 10, 2024  9:18 AM Carrielelia G wrote: Medication: apixaban  (ELIQUIS ) 5 MG TABS tablet  Has the patient contacted their pharmacy? Yes (Agent: If no, request that the patient contact the pharmacy for the refill. If patient does not wish to contact the pharmacy document the reason why and proceed with request.) (Agent: If yes, when and what did the pharmacy advise?)  This is the patient's preferred pharmacy:  [id number] - [place], [place] - [address] AT [facility] OF [address] [place] [address] Phone: [phone] Fax: [phone]   Is this the correct pharmacy for this prescription? Yes If no, delete pharmacy and type the correct one.   Is the patient out of the medication? Yes  Has the patient been seen for an appointment in the last year OR does the patient have an upcoming appointment? Yes  Can we respond through MyChart? No  Agent: Please be advised that Rx refills may take up to 3 business days. We ask that you follow-up with your pharmacy.

## 2024-03-10 NOTE — Telephone Encounter (Signed)
 Patient is to complete medication on 03/14/2024.  Call to patient to see if they have any pills left.

## 2024-03-11 DIAGNOSIS — F3132 Bipolar disorder, current episode depressed, moderate: Secondary | ICD-10-CM | POA: Diagnosis not present

## 2024-03-11 DIAGNOSIS — F1510 Other stimulant abuse, uncomplicated: Secondary | ICD-10-CM | POA: Diagnosis not present

## 2024-03-18 ENCOUNTER — Other Ambulatory Visit: Payer: Self-pay

## 2024-03-18 ENCOUNTER — Other Ambulatory Visit: Payer: Self-pay | Admitting: Infectious Diseases

## 2024-03-18 DIAGNOSIS — B20 Human immunodeficiency virus [HIV] disease: Secondary | ICD-10-CM

## 2024-03-18 NOTE — Telephone Encounter (Unsigned)
 Copied from CRM #[id number]. Topic: Clinical - Medication Refill >> Mar 18, 2024  2:03 PM Shamecia H wrote: Medication: apixaban  (ELIQUIS ) 5 MG TABS tablet  Has the patient contacted their pharmacy? Yes (Agent: If no, request that the patient contact the pharmacy for the refill. If patient does not wish to contact the pharmacy document the reason why and proceed with request.) (Agent: If yes, when and what did the pharmacy advise?)  This is the patient's preferred pharmacy:  [id number] - [place], [place] - [address] AT [facility] OF [address] [place] [address] Phone: [phone] Fax: [phone]  Is this the correct pharmacy for this prescription? Yes If no, delete pharmacy and type the correct one.   Has the prescription been filled recently? Yes  Is the patient out of the medication? Yes  Has the patient been seen for an appointment in the last year OR does the patient have an upcoming appointment? Yes  Can we respond through MyChart? Yes  Agent: Please be advised that Rx refills may take up to 3 business days. We ask that you follow-up with your pharmacy.

## 2024-03-19 ENCOUNTER — Other Ambulatory Visit: Payer: Self-pay | Admitting: *Deleted

## 2024-03-19 DIAGNOSIS — G540 Brachial plexus disorders: Secondary | ICD-10-CM

## 2024-03-20 MED ORDER — DULOXETINE HCL 30 MG PO CPEP: ORAL_CAPSULE | ORAL | 2 refills | Status: DC

## 2024-03-25 DIAGNOSIS — M21371 Foot drop, right foot: Secondary | ICD-10-CM | POA: Diagnosis not present

## 2024-03-25 DIAGNOSIS — R262 Difficulty in walking, not elsewhere classified: Secondary | ICD-10-CM | POA: Diagnosis not present

## 2024-03-25 DIAGNOSIS — M25671 Stiffness of right ankle, not elsewhere classified: Secondary | ICD-10-CM | POA: Diagnosis not present

## 2024-03-25 DIAGNOSIS — M62561 Muscle wasting and atrophy, not elsewhere classified, right lower leg: Secondary | ICD-10-CM | POA: Diagnosis not present

## 2024-03-31 DIAGNOSIS — M25671 Stiffness of right ankle, not elsewhere classified: Secondary | ICD-10-CM | POA: Diagnosis not present

## 2024-03-31 DIAGNOSIS — Z7689 Persons encountering health services in other specified circumstances: Secondary | ICD-10-CM | POA: Diagnosis not present

## 2024-03-31 DIAGNOSIS — M21371 Foot drop, right foot: Secondary | ICD-10-CM | POA: Diagnosis not present

## 2024-03-31 DIAGNOSIS — R262 Difficulty in walking, not elsewhere classified: Secondary | ICD-10-CM | POA: Diagnosis not present

## 2024-03-31 DIAGNOSIS — M62561 Muscle wasting and atrophy, not elsewhere classified, right lower leg: Secondary | ICD-10-CM | POA: Diagnosis not present

## 2024-04-02 ENCOUNTER — Ambulatory Visit: Payer: Medicare (Managed Care) | Admitting: Infectious Diseases

## 2024-04-02 DIAGNOSIS — Z7689 Persons encountering health services in other specified circumstances: Secondary | ICD-10-CM | POA: Diagnosis not present

## 2024-04-02 DIAGNOSIS — M21371 Foot drop, right foot: Secondary | ICD-10-CM | POA: Diagnosis not present

## 2024-04-02 DIAGNOSIS — M25671 Stiffness of right ankle, not elsewhere classified: Secondary | ICD-10-CM | POA: Diagnosis not present

## 2024-04-02 DIAGNOSIS — M62561 Muscle wasting and atrophy, not elsewhere classified, right lower leg: Secondary | ICD-10-CM | POA: Diagnosis not present

## 2024-04-02 DIAGNOSIS — R262 Difficulty in walking, not elsewhere classified: Secondary | ICD-10-CM | POA: Diagnosis not present

## 2024-04-08 DIAGNOSIS — M21371 Foot drop, right foot: Secondary | ICD-10-CM | POA: Diagnosis not present

## 2024-04-08 DIAGNOSIS — M62561 Muscle wasting and atrophy, not elsewhere classified, right lower leg: Secondary | ICD-10-CM | POA: Diagnosis not present

## 2024-04-08 DIAGNOSIS — R262 Difficulty in walking, not elsewhere classified: Secondary | ICD-10-CM | POA: Diagnosis not present

## 2024-04-08 DIAGNOSIS — M25671 Stiffness of right ankle, not elsewhere classified: Secondary | ICD-10-CM | POA: Diagnosis not present

## 2024-04-09 ENCOUNTER — Other Ambulatory Visit: Payer: Self-pay

## 2024-04-09 ENCOUNTER — Ambulatory Visit (INDEPENDENT_AMBULATORY_CARE_PROVIDER_SITE_OTHER): Payer: Medicare (Managed Care) | Admitting: Infectious Diseases

## 2024-04-09 ENCOUNTER — Encounter: Payer: Self-pay | Admitting: Infectious Diseases

## 2024-04-09 VITALS — BP 116/78 | HR 92 | Temp 99.7°F | Ht 63.0 in | Wt 200.0 lb

## 2024-04-09 DIAGNOSIS — B20 Human immunodeficiency virus [HIV] disease: Secondary | ICD-10-CM | POA: Diagnosis not present

## 2024-04-09 NOTE — Progress Notes (Signed)
 Name: Daltin Beckworth  DOB: [birth year] MRN: [id number] PCP: Edgardo Pontiff, DO    Brief Narrative:  Dominiq Oleary is a 43 y.o. male with well controlled HIV.  Transferred care from [facility] in [place]   [phone]) HIV Risk: MSM History of OIs: none known  Intake Labs 07/23/2019: Hep B sAg (-), sAb (+ 2025), cAb (-); Hep A (immune), Hep C (-) Quantiferon (-) HLA B*5701 (-) G6PD: () Toxo IgG: (-)   Previous Regimens: Biktarvy    Genotypes: None on record   Subjective   Subjective:   Chief Complaint  Patient presents with   Follow-up      Discussed the use of AI scribe software for clinical note transcription with the patient, who gave verbal consent to proceed.  History of Present Illness   Dominie Chambers is a 43 year old male who presents with concerns about weight gain and follow-up for foot drop and colorectal issues.  He has experienced recent weight gain, now weighing over 200 pounds, which is above his usual weight of 195-196 pounds. He does not believe he is overeating and attributes the weight gain to factors other than diet, expressing concern that dieting would lead to starvation. Exercise is challenging due to foot drop, limiting his ability to run, although he can cycle. No sensation of fluid retention and adequate urination.  He has a history of foot drop and is currently seeking a second opinion through physical therapy. The initial physical therapist indicated no treatment options, but he is now undergoing therapy with a TENS machine. No improvement in the foot drop.  He is following up on colorectal issues and has been under the care of Dr. Bernarda Ned and Dr. Monita. Dr. Ned previously assessed that everything looked fine about a month and a half ago, but he has maintained his appointment with Dr. Monita for further evaluation. He has a history of higher-grade abnormalities and underwent an ablation over six months  ago.  In April, he was hospitalized for two weeks due to rhabdomyolysis and blood clots in his legs, which required dialysis. His kidney function has since returned to normal.  He is currently taking Biktarvy  every day without any lapse in treatment. Declined sexual health testing.        Review of Systems  Constitutional:  Negative for appetite change, chills, fatigue, fever and unexpected weight change.  Eyes:  Negative for visual disturbance.  Respiratory:  Negative for cough and shortness of breath.   Cardiovascular:  Negative for chest pain and leg swelling.  Gastrointestinal:  Negative for abdominal pain, diarrhea and nausea.  Genitourinary:  Negative for dysuria, genital sores and penile discharge.  Musculoskeletal:  Negative for joint swelling.  Skin:  Negative for color change and rash.  Neurological:  Negative for dizziness and headaches.  Hematological:  Negative for adenopathy.  Psychiatric/Behavioral:  Negative for sleep disturbance. The patient is not nervous/anxious.      Past Medical History:  Diagnosis Date   Anal fistula    Anxiety    Bipolar disorder (HCC)    Brachial plexus disorders 07/31/2018   Seen by [facility] neurology. MRI brain and cervical spine showed bilateral neural foraminal cysts C4-C5, C5-C6, C6-C7, brain within normal limits.  - continue cymbalta , flexeril, neurontin  - f/u with Dr. 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 60 tablet 0   ARISTADA 1064 MG/3.9ML prefilled syringe Inject 1,064 mg into the muscle every 2 (two) months.     BIKTARVY  50-200-25 MG TABS tablet TAKE 1 TABLET BY MOUTH DAILY 30 tablet 0   DULoxetine  (CYMBALTA ) 30 MG capsule TAKE 3 CAPSULES(90 MG) BY MOUTH DAILY 90 capsule 2   gabapentin  (NEURONTIN ) 400 MG capsule Take 400 mg by mouth 3 (three) times daily.     No facility-administered medications prior to visit.     Allergies  Allergen Reactions   Haloperidol Other (See Comments)    Tardive dyskinesia    Social History   Tobacco Use   Smoking status: Never   Smokeless tobacco: Never  Vaping Use   Vaping status: Never Used  Substance Use Topics   Alcohol use: Not Currently   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, Methamphetamines    Comment: last used a few months ago as of 03/2024    Social History   Substance and Sexual Activity  Sexual Activity Yes   Partners: Male   Comment: declined condoms   Social History   Substance and Sexual Activity  Sexual Activity Yes   Partners: Male   Comment: declined condoms      Objective    Objective:   Vitals:   04/09/24 1435  BP: 116/78  Pulse: 92  Temp: 99.7 F (37.6 C)  TempSrc: Temporal  SpO2: 95%  Weight: 200 lb (90.7 kg)  Height: 5' 3 (1.6 m)   Body mass index is 35.43 kg/m.  Physical Exam Vitals reviewed.  Constitutional:      Appearance: He is well-developed.     Comments: Seated comfortably in chair during visit.   HENT:     Mouth/Throat:     Dentition: Normal dentition. No dental abscesses.  Cardiovascular:     Rate and Rhythm: Normal rate and regular rhythm.     Heart sounds: Normal heart sounds.  Pulmonary:     Effort: Pulmonary effort is normal.     Breath sounds: Normal breath sounds.   Abdominal:     General: There is no distension.     Palpations: Abdomen is soft.     Tenderness: There is no abdominal tenderness.  Lymphadenopathy:     Cervical: No cervical adenopathy.  Skin:    General: Skin is warm and dry.     Findings: No rash.  Neurological:     Mental Status: He is alert and oriented to person, place, and time.  Psychiatric:        Judgment: Judgment normal.     Comments: In good spirits today and engaged in care discussion.      Lab Results Lab Results  Component Value Date   WBC 7.4 12/07/2023   HGB 10.9 (L) 12/07/2023   HCT 31.7 (L) 12/07/2023   MCV 89.3 12/07/2023   PLT 403 (H) 12/07/2023    Lab Results  Component Value Date   CREATININE  1.13 01/21/2024   BUN 12 01/21/2024   NA 138 01/21/2024   K 4.4 01/21/2024   CL 102 01/21/2024   CO2 21 01/21/2024    Lab Results  Component Value Date   ALT 17 01/21/2024   AST 17 01/21/2024   GGT 18 11/25/2023   ALKPHOS 85 01/21/2024   BILITOT 0.3 01/21/2024    Lab Results  Component Value Date   CHOL 190 09/26/2023   HDL 71 09/26/2023   LDLCALC 98 09/26/2023   TRIG 111 09/26/2023   CHOLHDL 2.7 09/26/2023   HIV 1 RNA Quant (Copies/mL)  Date Value  09/26/2023 Not Detected  08/20/2022 Not Detected  02/07/2022 Not Detected   CD4 T Cell Abs (/uL)  Date Value  02/07/2022 635  08/02/2021 989  02/06/2021 595      Assessment & Plan:     Human immunodeficiency virus (HIV) disease - HIV disease is managed with Biktarvy . Kidney function is a concern due to previous acute kidney injury, but current labs show normal kidney function. Biktarvy  is effective, but alternative medications are available if kidney function declines. - Repeat creatinine to monitor kidney function - Continue Biktarvy  if kidney function remains stable - Consider alternative HIV medication if kidney function declines  Acute kidney injury, resolved - Acute kidney injury resolved following hospitalization for  rhabdomyolysis. Kidney function returned to normal. - Repeat creatinine to ensure continued normal kidney function  Substance use disorder, in remission Substance use disorder in remission.  Plans to reconnect with a counselor at [facility] for additional support. - Reconnect with counselor at [facility] for support - Monitor for any signs of relapse  High Grade Anal dysplasia, under surveillance - Colorectal dysplasia under surveillance. Recent evaluation by Dr. Debby showed no concerning findings. Follow-up appointment with Dr. Monita scheduled for further evaluation. - Attend follow-up appointment with Dr. Monita - Continue regular surveillance for colorectal dysplasia  Recording duration: 10 minutes      No orders of the defined types were placed in this encounter.  Orders Placed This Encounter  Procedures   COMPLETE METABOLIC PANEL WITHOUT GFR   HIV 1 RNA quant-no reflex-bld   RPR   CBC w/Diff   Return in about 9 months (around 01/07/2025).   Corean Fireman, MSN, NP-C [facility]  [email or web address] Pager: [phone] Office: [phone] [facility] Main Line: [phone] *Secure Chat Communication Welcome   04/09/24  4:19 PM

## 2024-04-09 NOTE — Patient Instructions (Signed)
 Nice to see you   Will see you back in 9 months as long as labs look OK today  Recommend flu shot in September / October time frame.

## 2024-04-10 ENCOUNTER — Ambulatory Visit: Payer: Self-pay | Admitting: Infectious Diseases

## 2024-04-10 DIAGNOSIS — Z5320 Procedure and treatment not carried out because of patient's decision for unspecified reasons: Secondary | ICD-10-CM | POA: Diagnosis not present

## 2024-04-10 DIAGNOSIS — B20 Human immunodeficiency virus [HIV] disease: Secondary | ICD-10-CM | POA: Diagnosis not present

## 2024-04-10 DIAGNOSIS — R85613 High grade squamous intraepithelial lesion on cytologic smear of anus (HGSIL): Secondary | ICD-10-CM | POA: Diagnosis not present

## 2024-04-10 DIAGNOSIS — D013 Carcinoma in situ of anus and anal canal: Secondary | ICD-10-CM | POA: Diagnosis not present

## 2024-04-10 DIAGNOSIS — Z7689 Persons encountering health services in other specified circumstances: Secondary | ICD-10-CM | POA: Diagnosis not present

## 2024-04-10 DIAGNOSIS — K6282 Dysplasia of anus: Secondary | ICD-10-CM | POA: Diagnosis not present

## 2024-04-11 LAB — HIV-1 RNA QUANT-NO REFLEX-BLD
HIV 1 RNA Quant: NOT DETECTED {copies}/mL
HIV-1 RNA Quant, Log: NOT DETECTED {Log_copies}/mL

## 2024-04-11 LAB — CBC WITH DIFFERENTIAL/PLATELET
Absolute Lymphocytes: 1958 {cells}/uL (ref 850–3900)
Absolute Monocytes: 490 {cells}/uL (ref 200–950)
Basophils Absolute: 58 {cells}/uL (ref 0–200)
Basophils Relative: 0.6 %
Eosinophils Absolute: 19 {cells}/uL (ref 15–500)
Eosinophils Relative: 0.2 %
HCT: 42.3 % (ref 38.5–50.0)
Hemoglobin: 14.3 g/dL (ref 13.2–17.1)
MCH: 30.5 pg (ref 27.0–33.0)
MCHC: 33.8 g/dL (ref 32.0–36.0)
MCV: 90.2 fL (ref 80.0–100.0)
MPV: 10.4 fL (ref 7.5–12.5)
Monocytes Relative: 5.1 %
Neutro Abs: 7075 {cells}/uL (ref 1500–7800)
Neutrophils Relative %: 73.7 %
Platelets: 317 Thousand/uL (ref 140–400)
RBC: 4.69 Million/uL (ref 4.20–5.80)
RDW: 12.8 % (ref 11.0–15.0)
Total Lymphocyte: 20.4 %
WBC: 9.6 Thousand/uL (ref 3.8–10.8)

## 2024-04-11 LAB — COMPLETE METABOLIC PANEL WITHOUT GFR
AG Ratio: 1.5 (calc) (ref 1.0–2.5)
ALT: 25 U/L (ref 9–46)
AST: 25 U/L (ref 10–40)
Albumin: 4.4 g/dL (ref 3.6–5.1)
Alkaline phosphatase (APISO): 64 U/L (ref 36–130)
BUN: 12 mg/dL (ref 7–25)
CO2: 26 mmol/L (ref 20–32)
Calcium: 9.6 mg/dL (ref 8.6–10.3)
Chloride: 103 mmol/L (ref 98–110)
Creat: 1.07 mg/dL (ref 0.60–1.29)
Globulin: 2.9 g/dL (ref 1.9–3.7)
Glucose, Bld: 118 mg/dL — ABNORMAL HIGH (ref 65–99)
Potassium: 4 mmol/L (ref 3.5–5.3)
Sodium: 138 mmol/L (ref 135–146)
Total Bilirubin: 0.3 mg/dL (ref 0.2–1.2)
Total Protein: 7.3 g/dL (ref 6.1–8.1)

## 2024-04-11 LAB — RPR: RPR Ser Ql: NONREACTIVE

## 2024-04-16 NOTE — Telephone Encounter (Signed)
 Copied from CRM [id number]. Topic: Referral - Request for Referral >> Apr 14, 2024  9:56 AM Farrel B wrote: Did the patient discuss referral with their provider in the last year? No (If No - schedule appointment) (If Yes - send message)  Appointment offered? Yes  Type of order/referral and detailed reason for visit: Dermatology   Preference of office, provider, location: NA  If referral order, have you been seen by this specialty before? No (If Yes, this issue or another issue? When? Where?  Can we respond through MyChart? Yes

## 2024-04-21 ENCOUNTER — Telehealth: Payer: Self-pay | Admitting: *Deleted

## 2024-04-21 ENCOUNTER — Other Ambulatory Visit: Payer: Self-pay

## 2024-04-21 DIAGNOSIS — G540 Brachial plexus disorders: Secondary | ICD-10-CM

## 2024-04-21 NOTE — Telephone Encounter (Signed)
 Called pt for additional information - no answer, left message on pt's vm to call the office.

## 2024-04-21 NOTE — Telephone Encounter (Signed)
 Patient returning call, transferred to clinic

## 2024-04-21 NOTE — Telephone Encounter (Unsigned)
 Copied from CRM [id number]. Topic: Clinical - Medication Refill >> Apr 21, 2024 12:35 PM Mercer PEDLAR wrote: Medication: DULoxetine  (CYMBALTA ) 30 MG capsule gabapentin  (NEURONTIN ) 400 MG capsule  Has the patient contacted their pharmacy? Yes (Agent: If no, request that the patient contact the pharmacy for the refill. If patient does not wish to contact the pharmacy document the reason why and proceed with request.) (Agent: If yes, when and what did the pharmacy advise?)  This is the patient's preferred pharmacy:  [id number] - [place], [place] - [address] AT [facility] OF [address] [place] [address] Phone: [phone] Fax: [phone]  Is this the correct pharmacy for this prescription? Yes If no, delete pharmacy and type the correct one.   Has the prescription been filled recently? No  Is the patient out of the medication? Yes  Has the patient been seen for an appointment in the last year OR does the patient have an upcoming appointment? Yes  Can we respond through MyChart? Yes  Agent: Please be advised that Rx refills may take up to 3 business days. We ask that you follow-up with your pharmacy.

## 2024-04-21 NOTE — Telephone Encounter (Signed)
 Copied from CRM [id number]. Topic: General - Other >> Apr 21, 2024 12:34 PM Mercer PEDLAR wrote: Reason for CRM: Patient is requesting for his doctor to check his physical therapy notes, he stated that he needs approval for procedure scheduled for 04/23/24. He stated that he does not know the name of the procedure but he has been waiting for approval for weeks now.

## 2024-04-21 NOTE — Telephone Encounter (Signed)
 Patient is returning your call. Patient is requesting a call back.

## 2024-04-21 NOTE — Telephone Encounter (Signed)
 Return call from pt who stated [facility] had faxed notes for the doctor to sign. Stated PT wants to use a particular type of TENS unit; they need the approval from the doctor in order to proceed. He has an appt with them this Thursday 9/11.

## 2024-04-23 DIAGNOSIS — M21371 Foot drop, right foot: Secondary | ICD-10-CM | POA: Diagnosis not present

## 2024-04-23 DIAGNOSIS — R262 Difficulty in walking, not elsewhere classified: Secondary | ICD-10-CM | POA: Diagnosis not present

## 2024-04-23 DIAGNOSIS — M25671 Stiffness of right ankle, not elsewhere classified: Secondary | ICD-10-CM | POA: Diagnosis not present

## 2024-04-23 DIAGNOSIS — M62561 Muscle wasting and atrophy, not elsewhere classified, right lower leg: Secondary | ICD-10-CM | POA: Diagnosis not present

## 2024-04-26 ENCOUNTER — Other Ambulatory Visit: Payer: Self-pay | Admitting: Infectious Diseases

## 2024-04-26 DIAGNOSIS — B20 Human immunodeficiency virus [HIV] disease: Secondary | ICD-10-CM

## 2024-04-26 NOTE — Progress Notes (Deleted)
   Established Patient Office Visit  Subjective   Patient ID: Kenneth Hunt, male    DOB: 01/18/1981  Age: 43 y.o. MRN: [id number]  No chief complaint on file.   Kenneth Hunt is a 43 y.o. who presents to the clinic for ***. Please see problem based assessment and plan for additional details.   H/o syphilis  Rash, wants derm ref.   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 11/28/2023   DVT, lower extremity, distal, acute, bilateral (HCC) 11/25/2023   High transaminase levels 11/25/2023   AKI (acute kidney injury) (HCC) 11/24/2023   Seborrheic dermatitis 08/27/2023   Weight gain 08/27/2023   Left foot drop 04/24/2023   Blood blister 10/09/2022   Fatigue 08/20/2022   Healthcare maintenance 10/17/2020   Hip pain, bilateral 10/17/2020   Bipolar 2 disorder (HCC) 03/04/2019   Asymptomatic HIV infection, CD4 >=500 (HCC) 10/01/2018   Thoracic outlet syndrome 10/01/2018   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 BP Readings from Last 3 Encounters:  04/09/24 116/78  03/03/24 118/63  01/21/24 123/82   Wt Readings from Last 3 Encounters:  04/09/24 200 lb (90.7 kg)  03/03/24 196 lb 3.2 oz (89 kg)  01/21/24 192 lb 9.6 oz (87.4 kg)      Physical Exam   No results found for any visits on 04/27/24.  Last metabolic panel Lab Results  Component Value Date   GLUCOSE 118 (H) 04/09/2024   NA 138 04/09/2024   K 4.0 04/09/2024   CL 103 04/09/2024   CO2 26 04/09/2024   BUN 12 04/09/2024   CREATININE 1.07 04/09/2024   EGFR 82 01/21/2024   CALCIUM  9.6 04/09/2024    PHOS 4.4 (H) 12/17/2023   PROT 7.3 04/09/2024   ALBUMIN  4.5 01/21/2024   LABGLOB 2.6 01/21/2024   BILITOT 0.3 04/09/2024   ALKPHOS 85 01/21/2024   AST 25 04/09/2024   ALT 25 04/09/2024   ANIONGAP 11 12/10/2023   Last lipids Lab Results  Component Value Date   CHOL 190 09/26/2023   HDL 71 09/26/2023   LDLCALC 98 09/26/2023   TRIG 111 09/26/2023   CHOLHDL 2.7 09/26/2023   Last hemoglobin A1c Lab Results  Component Value Date   HGBA1C 5.6 08/27/2023      The 10-year ASCVD risk score (Arnett DK, et al., 2019) is: 1%    Assessment & Plan:   Problem List Items Addressed This Visit   None   No follow-ups on file.    Damien Lease, DO

## 2024-04-27 ENCOUNTER — Ambulatory Visit: Payer: Self-pay | Admitting: Student

## 2024-05-01 DIAGNOSIS — M62561 Muscle wasting and atrophy, not elsewhere classified, right lower leg: Secondary | ICD-10-CM | POA: Diagnosis not present

## 2024-05-01 DIAGNOSIS — M25671 Stiffness of right ankle, not elsewhere classified: Secondary | ICD-10-CM | POA: Diagnosis not present

## 2024-05-01 DIAGNOSIS — R262 Difficulty in walking, not elsewhere classified: Secondary | ICD-10-CM | POA: Diagnosis not present

## 2024-05-01 DIAGNOSIS — M21371 Foot drop, right foot: Secondary | ICD-10-CM | POA: Diagnosis not present

## 2024-05-04 ENCOUNTER — Other Ambulatory Visit: Payer: Self-pay

## 2024-05-04 DIAGNOSIS — G540 Brachial plexus disorders: Secondary | ICD-10-CM

## 2024-05-04 MED ORDER — GABAPENTIN 400 MG PO CAPS: 400.0000 mg | ORAL_CAPSULE | Freq: Three times a day (TID) | ORAL | 1 refills | Status: AC

## 2024-05-04 MED ORDER — DULOXETINE HCL 30 MG PO CPEP
ORAL_CAPSULE | ORAL | 2 refills | Status: AC
Start: 2024-05-04 — End: ?

## 2024-05-05 DIAGNOSIS — M21371 Foot drop, right foot: Secondary | ICD-10-CM | POA: Diagnosis not present

## 2024-05-05 DIAGNOSIS — M62561 Muscle wasting and atrophy, not elsewhere classified, right lower leg: Secondary | ICD-10-CM | POA: Diagnosis not present

## 2024-05-05 DIAGNOSIS — R262 Difficulty in walking, not elsewhere classified: Secondary | ICD-10-CM | POA: Diagnosis not present

## 2024-05-05 DIAGNOSIS — M25671 Stiffness of right ankle, not elsewhere classified: Secondary | ICD-10-CM | POA: Diagnosis not present

## 2024-05-06 DIAGNOSIS — F3132 Bipolar disorder, current episode depressed, moderate: Secondary | ICD-10-CM | POA: Diagnosis not present

## 2024-05-06 DIAGNOSIS — Z7689 Persons encountering health services in other specified circumstances: Secondary | ICD-10-CM | POA: Diagnosis not present

## 2024-05-06 DIAGNOSIS — F1510 Other stimulant abuse, uncomplicated: Secondary | ICD-10-CM | POA: Diagnosis not present

## 2024-05-07 ENCOUNTER — Telehealth: Payer: Self-pay | Admitting: *Deleted

## 2024-05-07 DIAGNOSIS — M62561 Muscle wasting and atrophy, not elsewhere classified, right lower leg: Secondary | ICD-10-CM | POA: Diagnosis not present

## 2024-05-07 DIAGNOSIS — M21371 Foot drop, right foot: Secondary | ICD-10-CM | POA: Diagnosis not present

## 2024-05-07 DIAGNOSIS — R262 Difficulty in walking, not elsewhere classified: Secondary | ICD-10-CM | POA: Diagnosis not present

## 2024-05-07 DIAGNOSIS — M25671 Stiffness of right ankle, not elsewhere classified: Secondary | ICD-10-CM | POA: Diagnosis not present

## 2024-05-07 NOTE — Telephone Encounter (Signed)
 Halley B. Marsa, MD [facility] [address] [place], [place] [address] [phone]  Chilon can you f/u?

## 2024-05-07 NOTE — Telephone Encounter (Signed)
 Copied from CRM #[id number]. Topic: Clinical - Request for Lab/Test Order >> May 07, 2024  3:08 PM Mercer PEDLAR wrote: Reason for CRM: Patient is requesting an order for Nerve conduction study which was recommended by physical therapist.

## 2024-05-15 ENCOUNTER — Ambulatory Visit: Payer: Self-pay

## 2024-05-15 NOTE — Progress Notes (Deleted)
   Established Patient Office Visit  Subjective   Patient ID: Kenneth Hunt, male    DOB: 12-Sep-1980  Age: 43 y.o. MRN: [id number]  No chief complaint on file.   HPI  {History (Optional):23778}  ROS    Objective:     There were no vitals taken for this visit. {Vitals History (Optional):23777}  Physical Exam   No results found for any visits on 05/15/24.  {Labs (Optional):23779}  The 10-year ASCVD risk score (Arnett DK, et al., 2019) is: 1%    Assessment & Plan:   Problem List Items Addressed This Visit   None   No follow-ups on file.    Emmelina Mcloughlin D'Mello, DO

## 2024-05-21 DIAGNOSIS — F1520 Other stimulant dependence, uncomplicated: Secondary | ICD-10-CM | POA: Diagnosis not present

## 2024-05-22 DIAGNOSIS — B20 Human immunodeficiency virus [HIV] disease: Secondary | ICD-10-CM | POA: Diagnosis not present

## 2024-05-22 DIAGNOSIS — Z7689 Persons encountering health services in other specified circumstances: Secondary | ICD-10-CM | POA: Diagnosis not present

## 2024-05-22 DIAGNOSIS — K6282 Dysplasia of anus: Secondary | ICD-10-CM | POA: Diagnosis not present

## 2024-05-26 ENCOUNTER — Ambulatory Visit: Payer: Medicare (Managed Care)

## 2024-05-26 VITALS — BP 116/79 | HR 89 | Temp 98.2°F | Ht 63.0 in | Wt 201.4 lb

## 2024-05-26 DIAGNOSIS — G5731 Lesion of lateral popliteal nerve, right lower limb: Secondary | ICD-10-CM | POA: Diagnosis not present

## 2024-05-26 DIAGNOSIS — Z0000 Encounter for general adult medical examination without abnormal findings: Secondary | ICD-10-CM

## 2024-05-26 DIAGNOSIS — L219 Seborrheic dermatitis, unspecified: Secondary | ICD-10-CM | POA: Diagnosis not present

## 2024-05-26 DIAGNOSIS — Z23 Encounter for immunization: Secondary | ICD-10-CM | POA: Insufficient documentation

## 2024-05-26 DIAGNOSIS — Z1211 Encounter for screening for malignant neoplasm of colon: Secondary | ICD-10-CM

## 2024-05-26 DIAGNOSIS — Z9889 Other specified postprocedural states: Secondary | ICD-10-CM | POA: Diagnosis not present

## 2024-05-26 DIAGNOSIS — Z8619 Personal history of other infectious and parasitic diseases: Secondary | ICD-10-CM

## 2024-05-26 MED ORDER — KETOCONAZOLE 2 % EX CREA: 1.0000 | TOPICAL_CREAM | Freq: Every day | CUTANEOUS | 1 refills | Status: AC

## 2024-05-26 NOTE — Assessment & Plan Note (Signed)
 Order sent for colonoscopy.  Patient has never had 1 before and has no family history of colon cancer.  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  After shared decision making, patient agreed to proceed with colonoscopy  Plan: Ambulatory referral to GI for colonoscopy sent

## 2024-05-26 NOTE — Assessment & Plan Note (Signed)
Patient was given flu shot today. 

## 2024-05-26 NOTE — Assessment & Plan Note (Signed)
 States that he has been noticing this in the eyebrows, behind his ears bilaterally.  No itchiness, mostly flakiness and mild erythema.  Given physical exam do believe that this is consistent with seborrheic dermatitis, especially with no well-demarcated areas of rash.  Plan: Ketoconazole cream for 2 weeks daily.  Have informed patient if he gets flareup then can use for 5 to 7 days straight

## 2024-05-26 NOTE — Assessment & Plan Note (Signed)
 Patient has not been able to establish with neurology yet. Did give patient number for neurologist in [facility] that referral been sent to.  Did inform him that if he would not be able to be seen there in the next 2 months to call us  or message us  and let us  know and we can try to resend referral to neurology here in [place].  Patient states that he went to bed 6 physical therapy sessions and was able to see some benefit from it but due to cost and transportation issues has not been able to go although he has been able to carry forward some of the practices that he learned in physical therapy.  I believe that he would benefit from seeing neurology for further management

## 2024-05-26 NOTE — Patient Instructions (Addendum)
 Seborrheic dermatitis is a common skin condition that causes red, flaky, and sometimes itchy patches, often on the scalp, face, and other areas with lots of oil glands. It is not contagious, but it can come and go over time.  What to expect:  You may notice greasy, yellowish scales or flakes, redness, and itching.  In people with darker skin, lighter or darker patches may appear instead of redness.  How to care for your skin:  Scalp: Use an antifungal shampoo (such as ketoconazole, selenium sulfide, or zinc pyrithione) 2-3 times a week. Massage into the scalp, leave on for 5 minutes, then rinse. Once your scalp improves, you can use the shampoo once a week or every other week to help prevent flares.   Face and body: Apply antifungal creams (like ketoconazole or ciclopirox) to affected areas once or twice daily for up to 4 weeks. After improvement, use as needed to keep symptoms under control.   Other treatments: For thick, scaly areas, keratolytic shampoos or lotions (containing ingredients like salicylic acid) may help loosen scales.   Short-term relief: If itching or redness is severe, your doctor may prescribe a mild corticosteroid cream for short periods. Do not use steroid creams for more than a few weeks at a time, as long-term use can cause skin thinning and other side effects  General tips:  Wash affected areas gently with a mild cleanser; avoid harsh soaps and scrubbing.  Moisturize regularly to reduce dryness and flaking.  Try to manage stress, as it can make symptoms worse.  Seborrheic dermatitis often comes back, so keep your treatments handy for future flares  When to call your doctor:  If your symptoms do not improve after a few weeks of treatment.  If you notice pain, swelling, or signs of infection.  If you have questions about your medications or side effects.   For your skin rash: for the first two weeks, apply this cream to your face daily in the areas affected. After  this if you notice the rash starting to come back, you can apply this cream for 5-7 days to see if this helps  For your neurology appointment  Halley B. Marsa, MD [facility] [address] [place], [place] [address] [phone]  Call them and see if they are able to schedule you sooner. If not, then please call us  back and let us  know and we can reach out to some of the other providers in this area.   We will see you back in 4 months

## 2024-05-26 NOTE — Progress Notes (Signed)
 Established Patient Office Visit  Subjective   Patient ID: Kenneth Hunt, male    DOB: 25-Sep-1980  Age: 43 y.o. 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    Patient states that rash has been going on for the last year and comes and goes.  Patient denies any triggers and states it goes away on its own.  Patient reports that he noticed some redness between eyebrows and on his cheeks under eyelids. He also endorses flakiness of eyebrows and behind ears as well. He reports that he barely has any itchiness associated with the rash.  If patient starts rubbing those areas, it does irritate the flakiness.Patient states that he tried an over the counter  ointment, possibly OTC steroids.  He denies any personal history of psoriasis or eczema.  He denies any new exposures to products or medications.    ROS See HPI for more details  Objective:     BP 116/79 (BP Location: Left Arm, Patient Position: Sitting, Cuff Size: Normal)   Pulse 89   Temp 98.2 F (36.8 C) (Oral)   Ht 5' 3 (1.6 m)   Wt 201 lb 6.4 oz (91.4 kg)   SpO2 98%   BMI 35.68 kg/m  BP Readings from Last 3 Encounters:  05/26/24 116/79  04/09/24 116/78  03/03/24 118/63   Wt Readings from Last 3 Encounters:  05/26/24 201 lb 6.4 oz (91.4 kg)  04/09/24 200 lb (90.7 kg)  03/03/24 196 lb 3.2 oz (89 kg)      Physical Exam Constitution: Alert, no acute distress Heart: Regular rate and rhythm Lungs: Respiratory effort normal, clear to auscultation Skin: Mild erythema behind pinna of ears bilaterally with mild flakiness but no well demarcated pattern.  Do not know any  erythema or flakiness in the eyebrow area or on face.  Not noticed on any expressible areas of the lower or upper extremities.  No results found for any visits on 05/26/24.    The 10-year ASCVD risk score (Arnett DK, et al., 2019) is: 1%    Assessment & Plan:   Problem List Items Addressed This Visit     Healthcare maintenance - Primary   Order sent for colonoscopy.  Patient has never had 1 before and has no family history of colon cancer.  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  After shared decision making, patient agreed to proceed with colonoscopy  Plan: Ambulatory referral to GI for colonoscopy sent      Seborrheic dermatitis (Chronic)   States that he has been noticing this in the eyebrows, behind his ears bilaterally.  No itchiness, mostly flakiness and mild erythema.  Given physical exam do believe that this is consistent with seborrheic dermatitis, especially with no well-demarcated areas of rash.  Plan: Ketoconazole cream for 2 weeks daily.  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 Did give patient number for neurologist in [facility] that referral been sent to.  Did inform him that if he would not be able to be seen there in the next 2 months to call us  or message us  and let us  know and we can try to resend referral to neurology here in [place].  Patient states that he went to bed 6 physical therapy sessions and was able to see some benefit from it but due to cost and transportation issues has not been able to go although he has been able to carry forward some of the practices that he learned in physical therapy.  I believe that he would benefit from  seeing neurology for further management      Encounter for immunization   Patient was given flu shot today      Relevant Orders   Flu vaccine trivalent PF, 6mos and older(Flulaval,Afluria,Fluarix,Fluzone) (Completed)    Return in about 4 months (around 09/26/2024).    Valeta Paz D'Mello, DO Patient seen with Dr.Machen

## 2024-05-27 NOTE — Progress Notes (Signed)
Internal Medicine Clinic Attending  I was physically present during the key portions of the resident provided service and participated in the medical decision making of patient's management care. I reviewed pertinent patient test results.  The assessment, diagnosis, and plan were formulated together and I agree with the documentation in the resident's note.  Mercie Eon, MD

## 2024-06-18 DIAGNOSIS — F1520 Other stimulant dependence, uncomplicated: Secondary | ICD-10-CM | POA: Diagnosis not present

## 2024-07-14 DIAGNOSIS — F1520 Other stimulant dependence, uncomplicated: Secondary | ICD-10-CM | POA: Diagnosis not present

## 2024-07-16 DIAGNOSIS — F1520 Other stimulant dependence, uncomplicated: Secondary | ICD-10-CM | POA: Diagnosis not present

## 2024-07-16 DIAGNOSIS — F1120 Opioid dependence, uncomplicated: Secondary | ICD-10-CM | POA: Diagnosis not present

## 2024-07-23 ENCOUNTER — Ambulatory Visit: Payer: Self-pay

## 2024-07-23 NOTE — Telephone Encounter (Signed)
 Pt has an appt tomorrow 12/12 with Dr Amoako.

## 2024-07-23 NOTE — Telephone Encounter (Signed)
 FYI Only or Action Required?: FYI only for provider: appointment scheduled on 07/24/24.  Patient was last seen in primary care on 05/26/2024 by D'Mello, Rosalyn, DO.  Called Nurse Triage reporting Wound Infection.  Symptoms began a week ago.  Interventions attempted: Nothing.  Symptoms are: unchanged.  Triage Disposition: See Physician Within 24 Hours  Patient/caregiver understands and will follow disposition?: Yes   Copied from CRM #[id number]. Topic: Clinical - Red Word Triage >> Jul 23, 2024 11:27 AM Susanna ORN wrote: Red Word that prompted transfer to Nurse Triage: Patient states that he cut his right inner forearm and now thinks he has a staph infection. States that it's swollen & very red. He wants someone to look at it and possibly prescribe some antibiotics. Reason for Disposition  [1] Looks infected (e.g., spreading redness, pus) AND [2] no fever  Answer Assessment - Initial Assessment Questions No available appts today, scheduled within 24 hours 07/24/24.  Advised ED/911 if symptoms worsen: red streaks, spreading redness, severe swelling, increase size, increase pain, drainage.  Patient verbalized understanding.  1. APPEARANCE of INJURY: What does the injury look like?       injection from needle drug use;  clean needle, but didn't clean area prior to using 2. ONSET: How long ago did the injury occur?      Last Thursday 3. LOCATION: Where is the injury located?      Right forearm 4. SIZE: How large is the cut?      1/2 x1/2 inch 5. BLEEDING: Is it bleeding now? If Yes, ask: Is it difficult to stop?      no 6. PAIN: Is there any pain? If Yes, ask: How bad is the pain? (Scale 0-10; or none, mild, moderate, severe)     0/10 7. MECHANISM: Tell me how it happened.      Redness only to site,  swollen up arm, raised, warm, tender to touch denies drainage, bleeding, red streaks, spreading redness No fever, chills, n/v, numbness/ weakness, diff breathing,  chest pain 8. TETANUS: When was your last tetanus booster?     unsure  Protocols used: Cuts and Lacerations-A-AH

## 2024-07-24 ENCOUNTER — Ambulatory Visit: Payer: Medicare (Managed Care) | Admitting: Student

## 2024-07-24 VITALS — BP 122/81 | HR 98 | Temp 97.6°F | Ht 63.0 in | Wt 199.8 lb

## 2024-07-24 DIAGNOSIS — Z85048 Personal history of other malignant neoplasm of rectum, rectosigmoid junction, and anus: Secondary | ICD-10-CM | POA: Diagnosis not present

## 2024-07-24 DIAGNOSIS — L089 Local infection of the skin and subcutaneous tissue, unspecified: Secondary | ICD-10-CM | POA: Diagnosis not present

## 2024-07-24 DIAGNOSIS — L02413 Cutaneous abscess of right upper limb: Secondary | ICD-10-CM | POA: Diagnosis not present

## 2024-07-24 DIAGNOSIS — Z79899 Other long term (current) drug therapy: Secondary | ICD-10-CM

## 2024-07-24 DIAGNOSIS — B20 Human immunodeficiency virus [HIV] disease: Secondary | ICD-10-CM | POA: Diagnosis not present

## 2024-07-24 MED ORDER — DOXYCYCLINE HYCLATE 100 MG PO TABS: 100.0000 mg | ORAL_TABLET | Freq: Two times a day (BID) | ORAL | 0 refills | Status: AC

## 2024-07-24 NOTE — Patient Instructions (Signed)
 Thank you, [name] for allowing us  to provide your care today. Today we discussed your skin abcess.Thank you for allowing us  to drain it .  I am sending you home with a 10 days of doxycycline  100 mg to take twice daily. I also got a wound culture.TAKE THIS WITH FOOD AND TRY TO STAY AWAY FROM THE SUN  I will call you when results comes back.  If you have worsening symptoms like we discussed but call us  back or go to the ED     I have ordered the following labs for you:  Lab Orders         Culture, Wound      Tests ordered today:    Referrals ordered today:   Referral Orders  No referral(s) requested today     I have ordered the following medication/changed the following medications:   Stop the following medications: There are no discontinued medications.   Start the following medications: Meds ordered this encounter  Medications   doxycycline  (VIBRA -TABS) 100 MG tablet    Sig: Take 1 tablet (100 mg total) by mouth 2 (two) times daily.    Dispense:  10 tablet    Refill:  0     Follow up: 2 months   Remember:   Should you have any questions or concerns please call the internal medicine clinic at [phone].   Drue Lisa Grow MD 07/24/2024, 10:18 AM   [facility]

## 2024-07-24 NOTE — Assessment & Plan Note (Addendum)
 Kenneth Hunt is a 43 year old man with HIV on Biktarvy  and a history of anal high-grade dysplasia s/p ablation who presents with a 1-week history of a right forearm wound. He initially noted localized erythema following a minor cut of unclear origin. The erythema and swelling progressed over several days but have since begun to improve. He denies fever, chills, dyspnea, or rashes elsewhere. He has no known antibiotic allergies. On examination, there is localized erythema with sharp borders, mild fluctuance, and warmth to touch. The area is non-tender. He is afebrile and without systemic signs of infection. The presentation is most consistent with a skin abscess. The sharply demarcated borders and presence of fluctuance make cellulitis and erysipelas less likely. Although initially hesitant, the patient consented to incision and drainage after counseling regarding the limited efficacy of antibiotics alone for abscess management. I&D was performed in the clinic today. A sample was sent for aerobic and anaerobic cultures. He was prescribed doxycycline  100 mg PO BID for 5 days. Plan :  - Incision and drainage performed in clinic today with good return of purulent and blood material.  - Send wound specimen for aerobic and anaerobic cultures; follow results and adjust antibiotics as needed.  - Start doxycycline  100 mg PO BID x 5 days for empiric coverage.  - Advise warm compresses to the area 3-4 times daily to promote continued drainage.  - Instruct patient to monitor for worsening erythema, increased swelling, fever, or purulent discharge.  - Return to clinic in 48-72 hours for wound check or sooner if symptoms worsen.

## 2024-07-24 NOTE — Progress Notes (Signed)
 CC: Wound infection   HPI:  [name] is a 43 y.o. male living with a history stated below and presents today for skin abscess . Please see problem based assessment and plan for additional details.  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 MRI brain and cervical spine showed bilateral neural foraminal cysts C4-C5, C5-C6, C6-C7, brain within normal limits.  - continue cymbalta , flexeril, neurontin  - f/u with Dr. 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   Rhabdomyolysis 12/17/2023   TOS (thoracic outlet syndrome)     Medications Ordered Prior to Encounter[1]  Family History  Problem Relation Age of Onset   COPD Mother     Social History   Socioeconomic History   Marital status: Single    Spouse name: Not on file   Number of children: Not on file   Years of education: Not on file   Highest education level: Not on file  Occupational History   Not on file  Tobacco Use   Smoking status: Never   Smokeless tobacco: Never  Vaping Use   Vaping status: Never Used  Substance and Sexual Activity   Alcohol use: Not Currently   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, Methamphetamines    Comment: last used a few months ago as of 03/2024   Sexual activity: Yes    Partners: Male    Comment: declined condoms  Other Topics Concern   Not on file  Social History  Narrative   Not on file   Social Drivers of Health   Tobacco Use: Low Risk (04/10/2024)   Received from [facility]   Patient History    Smoking Tobacco Use: Never    Smokeless Tobacco Use: Never    Passive Exposure: Not on file  Financial Resource Strain: Low Risk (10/09/2022)   Overall Financial Resource Strain (CARDIA)    Difficulty of Paying Living Expenses: Not very hard  Food Insecurity: No Food Insecurity (11/25/2023)   Hunger Vital Sign    Worried About Running Out of Food in the Last Year: Never true    Ran Out of Food in the Last Year: Never true  Transportation Needs: No Transportation Needs (11/25/2023)   PRAPARE - Administrator, Civil Service (Medical): No    Lack of Transportation (Non-Medical): No  Physical Activity: Inactive (10/09/2022)   Exercise Vital Sign    Days of Exercise per Week: 0 days    Minutes of Exercise per Session: 0 min  Stress: Stress Concern Present (10/09/2022)   [employer] of Occupational Health - Occupational Stress Questionnaire    Feeling of Stress : Rather much  Social Connections: Not on File (05/04/2023)   Received from [facility]   Social Connections    Connectedness: 0  Intimate Partner Violence: Not At Risk (11/25/2023)   Humiliation, Afraid, Rape, and Kick questionnaire    Fear  of Current or Ex-Partner: No    Emotionally Abused: No    Physically Abused: No    Sexually Abused: No  Depression (PHQ2-9): Low Risk (07/24/2024)   Depression (PHQ2-9)    PHQ-2 Score: 0  Alcohol Screen: Low Risk (10/09/2022)   Alcohol Screen    Last Alcohol Screening Score (AUDIT): 1  Housing: Unknown (02/24/2024)   Received from [facility]   Epic    Unable to Pay for Housing in the Last Year: Not on file    Number of Times Moved in the Last Year: Not on file    At any time in the past 12 months, were you homeless or living in a shelter (including now)?: No  Utilities: Not At Risk (11/25/2023)   [facility] Utilities     Threatened with loss of utilities: No  Health Literacy: Not on file    Review of Systems: ROS negative except for what is noted on the assessment and plan.  Vitals:   07/24/24 0849  BP: 122/81  Pulse: 98  Temp: 97.6 F (36.4 C)  TempSrc: Oral  SpO2: 97%  Weight: 199 lb 12.8 oz (90.6 kg)  Height: 5' 3 (1.6 m)    Physical Exam: Constitutional: well-appearing man, sitting in chair , in no acute distress Cardiovascular: regular rate and rhythm, no m/r/g Pulmonary/Chest: normal work of breathing on room air, lungs clear to auscultation bilaterally Neurological: alert & oriented x 3 Skin: warm and dry.there is localized erythema with sharp borders, mild fluctuance, and warmth to touch. The area is non-tender Psych: normal mood and behavior  Assessment & Plan:   Cutaneous abscess of right upper extremity [name] is a 43 year old man with HIV on Biktarvy  and a history of anal high-grade dysplasia s/p ablation who presents with a 1-week history of a right forearm wound. He initially noted localized erythema following a minor cut of unclear origin. The erythema and swelling progressed over several days but have since begun to improve. He denies fever, chills, dyspnea, or rashes elsewhere. He has no known antibiotic allergies. On examination, there is localized erythema with sharp borders, mild fluctuance, and warmth to touch. The area is non-tender. He is afebrile and without systemic signs of infection. The presentation is most consistent with a skin abscess. The sharply demarcated borders and presence of fluctuance make cellulitis and erysipelas less likely. Although initially hesitant, the patient consented to incision and drainage after counseling regarding the limited efficacy of antibiotics alone for abscess management. I&D was performed in the clinic today. A sample was sent for aerobic and anaerobic cultures. He was prescribed doxycycline  100 mg PO BID for 5 days. Plan :  -  Incision and drainage performed in clinic today with good return of purulent and blood material.  - Send wound specimen for aerobic and anaerobic cultures; follow results and adjust antibiotics as needed.  - Start doxycycline  100 mg PO BID x 5 days for empiric coverage.  - Advise warm compresses to the area 3-4 times daily to promote continued drainage.  - Instruct patient to monitor for worsening erythema, increased swelling, fever, or purulent discharge.  - Return to clinic in 48-72 hours for wound check or sooner if symptoms worsen.    Diagnosis: abscess - Location: Right Forearm Procedure: Incision & drainage Informed consent:  Discussed risks (permanent loss of nail, permanent irregular growth of nail, infection, pain, bleeding, bruising, numbness, and recurrence of the condition) and benefits of the procedure, as well as the alternatives.  Informed consent was obtained. Anesthesia: Lidocaine   Type: partial The area was prepared and draped in a standard fashion. The lesion drained pus and blood. A large amount of fluid was drained. Antibiotic ointment and a sterile pressure dressing were applied. Cleaned and dressed with 2x2 gauze and Tegaderm The patient tolerated the procedure well. The patient was instructed on post-op care.   Patient seen with Dr. Lovie Drue Grow, M.D [facility] Internal [facility] Phone: [phone] Date 07/24/2024 Time 3:45 PM     [1]  Current Outpatient Medications on File Prior to Visit  Medication Sig Dispense Refill   ARISTADA 1064 MG/3.9ML prefilled syringe Inject 1,064 mg into the muscle every 2 (two) months.     BIKTARVY  50-200-25 MG TABS tablet TAKE 1 TABLET BY MOUTH DAILY 30 tablet 8   DULoxetine  (CYMBALTA ) 30 MG capsule TAKE 3 CAPSULES(90 MG) BY MOUTH DAILY 90 capsule 2   gabapentin  (NEURONTIN ) 400 MG capsule Take 1 capsule (400 mg total) by mouth 3 (three) times daily. 270 capsule 1   ketoconazole  (NIZORAL ) 2 % cream Apply 1  Application topically daily. 60 g 1   No current facility-administered medications on file prior to visit.

## 2024-07-27 ENCOUNTER — Ambulatory Visit: Payer: Self-pay | Admitting: Student

## 2024-07-27 NOTE — Progress Notes (Signed)
 Internal Medicine Clinic Attending  I was physically present during the key portions of the resident provided service and participated in the medical decision making of patient's management care. I reviewed pertinent patient test results.  The assessment, diagnosis, and plan were formulated together and I agree with the documentation in the resident's note.  Lovie Clarity, MD   I was present for the I&D. No immediate complications.  The surrounding skin was erythematous, so I agree with Doxy for 5 days

## 2024-07-30 LAB — ANAEROBIC/AEROBIC/GRAM STAIN
Result 2: NONE SEEN
Result 2: NONE SEEN

## 2024-08-02 NOTE — Progress Notes (Signed)
 I called [name] to discuss his abscess culture results and to assess his clinical status. He reports significant improvement in the abscess and denies fever or chills. He states that the surrounding erythema has markedly decreased. Abscess cultures grew Cutibacterium acnes, which is covered by doxycycline , the antibiotic regimen he was initiated on. The patient expressed appreciation for the call and was advised to notify the clinic if his symptoms worsen or change. At this time, no changes to management are indicated given his reported improvement.  Kenneth Lisa Grow MD Internal Medicine Resident: [id number]-[id number] Please contact the on call pager at: [phone] 08/02/2024, 7:35 PM

## 2025-01-07 ENCOUNTER — Ambulatory Visit: Payer: Medicare (Managed Care) | Admitting: Infectious Diseases
